# Patient Record
Sex: Female | Born: 1970
Health system: Southern US, Community
[De-identification: ages and names within clinical notes are randomized; demographics above are authoritative.]

## PROBLEM LIST (undated history)

## (undated) DIAGNOSIS — F32A Depression, unspecified: Secondary | ICD-10-CM

## (undated) DIAGNOSIS — M1990 Unspecified osteoarthritis, unspecified site: Secondary | ICD-10-CM

## (undated) DIAGNOSIS — E119 Type 2 diabetes mellitus without complications: Secondary | ICD-10-CM

## (undated) DIAGNOSIS — J302 Other seasonal allergic rhinitis: Secondary | ICD-10-CM

## (undated) DIAGNOSIS — I10 Essential (primary) hypertension: Secondary | ICD-10-CM

## (undated) DIAGNOSIS — R7303 Prediabetes: Secondary | ICD-10-CM

## (undated) DIAGNOSIS — M549 Dorsalgia, unspecified: Secondary | ICD-10-CM

## (undated) DIAGNOSIS — B019 Varicella without complication: Secondary | ICD-10-CM

## (undated) DIAGNOSIS — R002 Palpitations: Secondary | ICD-10-CM

## (undated) DIAGNOSIS — I872 Venous insufficiency (chronic) (peripheral): Secondary | ICD-10-CM

## (undated) DIAGNOSIS — F419 Anxiety disorder, unspecified: Secondary | ICD-10-CM

## (undated) DIAGNOSIS — G4730 Sleep apnea, unspecified: Secondary | ICD-10-CM

## (undated) DIAGNOSIS — R42 Dizziness and giddiness: Secondary | ICD-10-CM

## (undated) DIAGNOSIS — I890 Lymphedema, not elsewhere classified: Secondary | ICD-10-CM

## (undated) DIAGNOSIS — T7840XA Allergy, unspecified, initial encounter: Secondary | ICD-10-CM

## (undated) DIAGNOSIS — N926 Irregular menstruation, unspecified: Secondary | ICD-10-CM

## (undated) DIAGNOSIS — G629 Polyneuropathy, unspecified: Secondary | ICD-10-CM

## (undated) DIAGNOSIS — L719 Rosacea, unspecified: Secondary | ICD-10-CM

## (undated) DIAGNOSIS — F329 Major depressive disorder, single episode, unspecified: Secondary | ICD-10-CM

## (undated) DIAGNOSIS — M2550 Pain in unspecified joint: Secondary | ICD-10-CM

## (undated) HISTORY — DX: Pain in unspecified joint: M25.50

## (undated) HISTORY — PX: WISDOM TOOTH EXTRACTION: SHX21

## (undated) HISTORY — DX: Rosacea, unspecified: L71.9

## (undated) HISTORY — DX: Depression, unspecified: F32.A

## (undated) HISTORY — DX: Venous insufficiency (chronic) (peripheral): I87.2

## (undated) HISTORY — DX: Unspecified osteoarthritis, unspecified site: M19.90

## (undated) HISTORY — DX: Essential (primary) hypertension: I10

## (undated) HISTORY — DX: Dizziness and giddiness: R42

## (undated) HISTORY — DX: Palpitations: R00.2

## (undated) HISTORY — DX: Allergy, unspecified, initial encounter: T78.40XA

## (undated) HISTORY — DX: Sleep apnea, unspecified: G47.30

## (undated) HISTORY — DX: Varicella without complication: B01.9

## (undated) HISTORY — DX: Lymphedema, not elsewhere classified: I89.0

## (undated) HISTORY — DX: Dorsalgia, unspecified: M54.9

## (undated) HISTORY — DX: Major depressive disorder, single episode, unspecified: F32.9

## (undated) HISTORY — DX: Type 2 diabetes mellitus without complications: E11.9

---
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
## 2004-10-18 ENCOUNTER — Ambulatory Visit: Payer: Self-pay | Admitting: Obstetrics and Gynecology

## 2005-05-09 ENCOUNTER — Ambulatory Visit: Payer: Self-pay

## 2005-11-27 ENCOUNTER — Ambulatory Visit: Payer: Self-pay | Admitting: General Practice

## 2005-12-18 ENCOUNTER — Ambulatory Visit: Payer: Self-pay | Admitting: General Practice

## 2006-01-18 ENCOUNTER — Ambulatory Visit: Payer: Self-pay | Admitting: General Practice

## 2006-02-17 ENCOUNTER — Ambulatory Visit: Payer: Self-pay | Admitting: General Practice

## 2006-03-20 ENCOUNTER — Ambulatory Visit: Payer: Self-pay | Admitting: General Practice

## 2006-04-20 ENCOUNTER — Ambulatory Visit: Payer: Self-pay | Admitting: General Practice

## 2006-05-19 ENCOUNTER — Ambulatory Visit: Payer: Self-pay | Admitting: General Practice

## 2006-06-19 ENCOUNTER — Ambulatory Visit: Payer: Self-pay | Admitting: General Practice

## 2006-07-19 ENCOUNTER — Ambulatory Visit: Payer: Self-pay | Admitting: General Practice

## 2006-08-19 ENCOUNTER — Ambulatory Visit: Payer: Self-pay | Admitting: General Practice

## 2009-05-14 ENCOUNTER — Ambulatory Visit: Payer: Self-pay | Admitting: Otolaryngology

## 2009-06-21 ENCOUNTER — Ambulatory Visit: Payer: Self-pay

## 2010-07-15 ENCOUNTER — Ambulatory Visit: Payer: Self-pay

## 2011-07-18 ENCOUNTER — Ambulatory Visit: Payer: Self-pay | Admitting: Obstetrics and Gynecology

## 2012-02-21 ENCOUNTER — Emergency Department: Payer: Self-pay | Admitting: Emergency Medicine

## 2012-02-21 LAB — COMPREHENSIVE METABOLIC PANEL
Albumin: 3.2 g/dL — ABNORMAL LOW (ref 3.4–5.0)
Anion Gap: 6 — ABNORMAL LOW (ref 7–16)
BUN: 13 mg/dL (ref 7–18)
Bilirubin,Total: 1.1 mg/dL — ABNORMAL HIGH (ref 0.2–1.0)
Chloride: 110 mmol/L — ABNORMAL HIGH (ref 98–107)
Co2: 25 mmol/L (ref 21–32)
Creatinine: 0.69 mg/dL (ref 0.60–1.30)
EGFR (African American): >60
EGFR (Non-African Amer.): >60
Osmolality: 284 (ref 275–301)
Potassium: 3.7 mmol/L (ref 3.5–5.1)
SGOT(AST): 22 U/L (ref 15–37)
SGPT (ALT): 30 U/L (ref 12–78)
Sodium: 141 mmol/L (ref 136–145)
Total Protein: 6.5 g/dL (ref 6.4–8.2)

## 2012-02-21 LAB — URINALYSIS, COMPLETE
Bilirubin,UR: NEGATIVE
Glucose,UR: NEGATIVE mg/dL (ref 0–75)
Ph: 5 (ref 4.5–8.0)
RBC,UR: 329 /HPF (ref 0–5)
Specific Gravity: 1.025 (ref 1.003–1.030)
Squamous Epithelial: 2

## 2012-02-21 LAB — CBC
HGB: 13.3 g/dL (ref 12.0–16.0)
MCH: 26 pg (ref 26.0–34.0)
MCHC: 32.8 g/dL (ref 32.0–36.0)
MCV: 79 fL — ABNORMAL LOW (ref 80–100)
RDW: 16 % — ABNORMAL HIGH (ref 11.5–14.5)

## 2012-02-21 LAB — CK TOTAL AND CKMB (NOT AT ARMC)
CK, Total: 70 U/L (ref 21–215)
CK-MB: 0.8 ng/mL (ref 0.5–3.6)

## 2012-02-21 LAB — PRO B NATRIURETIC PEPTIDE: B-Type Natriuretic Peptide: 164 pg/mL — ABNORMAL HIGH (ref 0–125)

## 2012-02-21 LAB — RAPID INFLUENZA A&B ANTIGENS

## 2012-07-25 ENCOUNTER — Ambulatory Visit: Payer: Self-pay | Admitting: Obstetrics and Gynecology

## 2013-11-20 DIAGNOSIS — N8501 Benign endometrial hyperplasia: Secondary | ICD-10-CM | POA: Insufficient documentation

## 2014-12-17 ENCOUNTER — Other Ambulatory Visit: Payer: Self-pay | Admitting: Obstetrics and Gynecology

## 2014-12-17 DIAGNOSIS — Z1231 Encounter for screening mammogram for malignant neoplasm of breast: Secondary | ICD-10-CM

## 2014-12-25 ENCOUNTER — Encounter: Payer: Self-pay | Admitting: Physician Assistant

## 2014-12-25 ENCOUNTER — Ambulatory Visit: Payer: Self-pay | Admitting: Physician Assistant

## 2014-12-25 VITALS — BP 140/90 | Temp 98.8°F

## 2014-12-25 DIAGNOSIS — J00 Acute nasopharyngitis [common cold]: Secondary | ICD-10-CM

## 2014-12-25 NOTE — Progress Notes (Signed)
S: C/o runny nose and congestion for 7 days, no fever, chills, cp/sob, v/d; mucus is clear and thick, Using otc meds: mucinex D with relief, when stops using it she gets stopped up again  O: PE: perrl eomi, normocephalic, tms dull, nasal mucosa pink and swollen, throat injected, neck supple no lymph, lungs c t a, cv rrr, neuro intact  A:  Acute common cold   P: drink fluids, continue regular meds , use otc meds of choice, return if not improving in 5 days, return earlier if worsening, if mucus is green yellow on MOn/tues will call in an antibiotic but do not feel patient needs one right now

## 2014-12-28 ENCOUNTER — Ambulatory Visit
Admission: RE | Admit: 2014-12-28 | Discharge: 2014-12-28 | Disposition: A | Discharge status: Home / Self Care | Payer: 59 | Source: Ambulatory Visit | Attending: Obstetrics and Gynecology | Admitting: Obstetrics and Gynecology

## 2014-12-28 DIAGNOSIS — Z1231 Encounter for screening mammogram for malignant neoplasm of breast: Secondary | ICD-10-CM | POA: Diagnosis present

## 2015-05-04 DIAGNOSIS — H1045 Other chronic allergic conjunctivitis: Secondary | ICD-10-CM | POA: Diagnosis not present

## 2015-08-04 DIAGNOSIS — R7309 Other abnormal glucose: Secondary | ICD-10-CM | POA: Diagnosis not present

## 2015-08-10 DIAGNOSIS — R7309 Other abnormal glucose: Secondary | ICD-10-CM | POA: Diagnosis not present

## 2016-01-06 DIAGNOSIS — L820 Inflamed seborrheic keratosis: Secondary | ICD-10-CM | POA: Diagnosis not present

## 2016-01-06 DIAGNOSIS — L538 Other specified erythematous conditions: Secondary | ICD-10-CM | POA: Diagnosis not present

## 2016-01-06 DIAGNOSIS — L298 Other pruritus: Secondary | ICD-10-CM | POA: Diagnosis not present

## 2016-01-12 ENCOUNTER — Other Ambulatory Visit: Payer: Self-pay | Admitting: Obstetrics and Gynecology

## 2016-02-02 ENCOUNTER — Other Ambulatory Visit: Payer: Self-pay | Admitting: Obstetrics and Gynecology

## 2016-02-02 DIAGNOSIS — Z124 Encounter for screening for malignant neoplasm of cervix: Secondary | ICD-10-CM | POA: Diagnosis not present

## 2016-02-02 DIAGNOSIS — Z1211 Encounter for screening for malignant neoplasm of colon: Secondary | ICD-10-CM | POA: Diagnosis not present

## 2016-02-02 DIAGNOSIS — Z1231 Encounter for screening mammogram for malignant neoplasm of breast: Secondary | ICD-10-CM | POA: Diagnosis not present

## 2016-02-02 DIAGNOSIS — Z01419 Encounter for gynecological examination (general) (routine) without abnormal findings: Secondary | ICD-10-CM | POA: Diagnosis not present

## 2016-03-08 DIAGNOSIS — H5213 Myopia, bilateral: Secondary | ICD-10-CM | POA: Diagnosis not present

## 2016-03-08 DIAGNOSIS — H524 Presbyopia: Secondary | ICD-10-CM | POA: Diagnosis not present

## 2016-03-08 DIAGNOSIS — H52223 Regular astigmatism, bilateral: Secondary | ICD-10-CM | POA: Diagnosis not present

## 2016-03-08 DIAGNOSIS — E119 Type 2 diabetes mellitus without complications: Secondary | ICD-10-CM | POA: Diagnosis not present

## 2016-03-09 ENCOUNTER — Ambulatory Visit
Admission: RE | Admit: 2016-03-09 | Discharge: 2016-03-09 | Disposition: A | Discharge status: Home / Self Care | Payer: 59 | Source: Ambulatory Visit | Attending: Obstetrics and Gynecology | Admitting: Obstetrics and Gynecology

## 2016-03-09 DIAGNOSIS — Z1231 Encounter for screening mammogram for malignant neoplasm of breast: Secondary | ICD-10-CM | POA: Diagnosis not present

## 2016-03-19 ENCOUNTER — Emergency Department
Admission: EM | Admit: 2016-03-19 | Discharge: 2016-03-19 | Disposition: A | Discharge status: Home / Self Care | Payer: 59 | Attending: Emergency Medicine | Admitting: Emergency Medicine

## 2016-03-19 ENCOUNTER — Encounter: Payer: Self-pay | Admitting: Emergency Medicine

## 2016-03-19 DIAGNOSIS — Z79899 Other long term (current) drug therapy: Secondary | ICD-10-CM | POA: Insufficient documentation

## 2016-03-19 DIAGNOSIS — I10 Essential (primary) hypertension: Secondary | ICD-10-CM | POA: Diagnosis not present

## 2016-03-19 DIAGNOSIS — Z7984 Long term (current) use of oral hypoglycemic drugs: Secondary | ICD-10-CM | POA: Insufficient documentation

## 2016-03-19 DIAGNOSIS — R42 Dizziness and giddiness: Secondary | ICD-10-CM | POA: Insufficient documentation

## 2016-03-19 HISTORY — DX: Other seasonal allergic rhinitis: J30.2

## 2016-03-19 HISTORY — DX: Irregular menstruation, unspecified: N92.6

## 2016-03-19 HISTORY — DX: Prediabetes: R73.03

## 2016-03-19 LAB — URINALYSIS, COMPLETE (UACMP) WITH MICROSCOPIC
BACTERIA UA: NONE SEEN
BILIRUBIN URINE: NEGATIVE
Glucose, UA: NEGATIVE mg/dL
KETONES UR: NEGATIVE mg/dL
NITRITE: NEGATIVE
PROTEIN: NEGATIVE mg/dL
Specific Gravity, Urine: 1.015 (ref 1.005–1.030)
pH: 6 (ref 5.0–8.0)

## 2016-03-19 LAB — BASIC METABOLIC PANEL
ANION GAP: 6 (ref 5–15)
BUN: 15 mg/dL (ref 6–20)
CHLORIDE: 107 mmol/L (ref 101–111)
CO2: 25 mmol/L (ref 22–32)
Calcium: 9 mg/dL (ref 8.9–10.3)
Creatinine, Ser: 0.69 mg/dL (ref 0.44–1.00)
GFR calc Af Amer: >60 mL/min (ref >60)
GFR calc non Af Amer: >60 mL/min (ref >60)
GLUCOSE: 110 mg/dL — AB (ref 65–99)
POTASSIUM: 3.9 mmol/L (ref 3.5–5.1)
Sodium: 138 mmol/L (ref 135–145)

## 2016-03-19 LAB — CBC
HEMATOCRIT: 37.3 % (ref 35.0–47.0)
HEMOGLOBIN: 12.4 g/dL (ref 12.0–16.0)
MCH: 25.1 pg — ABNORMAL LOW (ref 26.0–34.0)
MCHC: 33.3 g/dL (ref 32.0–36.0)
MCV: 75.3 fL — ABNORMAL LOW (ref 80.0–100.0)
Platelets: 233 10*3/uL (ref 150–440)
RBC: 4.95 MIL/uL (ref 3.80–5.20)
RDW: 16.5 % — ABNORMAL HIGH (ref 11.5–14.5)
WBC: 12.6 10*3/uL — AB (ref 3.6–11.0)

## 2016-03-19 MED ORDER — MECLIZINE HCL 25 MG PO TABS
25.0000 mg | ORAL_TABLET | Freq: Once | ORAL | Status: AC
  Administered 2016-03-19: 25 mg via ORAL
  Filled 2016-03-19: qty 1

## 2016-03-19 MED ORDER — ONDANSETRON HCL 4 MG/2ML IJ SOLN: 4.0000 mg | Freq: Once | INTRAMUSCULAR | Status: DC

## 2016-03-19 MED ORDER — MECLIZINE HCL 25 MG PO TABS: 25.0000 mg | ORAL_TABLET | Freq: Three times a day (TID) | ORAL | 0 refills | Status: DC | PRN

## 2016-03-19 MED ORDER — SODIUM CHLORIDE 0.9 % IV BOLUS (SEPSIS): 1000.0000 mL | Freq: Once | INTRAVENOUS | Status: DC

## 2016-03-19 MED ORDER — ONDANSETRON HCL 4 MG PO TABS: 4.0000 mg | ORAL_TABLET | Freq: Three times a day (TID) | ORAL | 0 refills | Status: DC | PRN

## 2016-03-19 NOTE — ED Notes (Signed)
Pt states she prefers not to have an iv at this time. Is drinking water. Will monitor pt for now

## 2016-03-19 NOTE — ED Provider Notes (Addendum)
[facility] Emergency Department Provider Note  ____________________________________________   I have reviewed the triage vital signs and the nursing notes.   HISTORY  Chief Complaint Dizziness and Hypertension    HPI Belinda Garrett is a 45 y.o. female  Who  Presents today complaining of dizziness. Patient has a sensation of true vertigo. Started this morning when she turned her head after waking up. Before that she had no symptoms. Patient states that when she sits still she does not have any symptoms when she turns her head to the right or left she has a wave of lightheadedness, spinning sensation and nausea. It lasts for a few moments and then goes away if she sits still again. She does not have any other neurologic complaints. She denies any headache stiff neck recent fall she denies any dysuria or urinary frequency she denies any chest pain shortness of breath she denies any recent illness, she does not have a history of otitis media in the recent or distant past. She states she does clean her ears out pretty aggressively with Q-tips but has not done so today. She does not believe that she injured her ear while doing it yesterday. She states that she has no difficulty with her balance or her coordination in the context of this disease process.     Past Medical History:  Diagnosis Date  . Abnormal menses   . Prediabetes   . Seasonal allergies     Patient Active Problem List   Diagnosis Date Noted  . Morbid obesity ([place]) 11/20/2013  . Endometrial hyperplasia, simple 11/20/2013    No past surgical history on file.  Prior to Admission medications   Medication Sig Start Date End Date Taking? Authorizing Provider  FINACEA 15 % cream  12/18/14   Historical Provider, MD  FLUoxetine (PROZAC) 20 MG capsule Take 20 mg by mouth daily.    Historical Provider, MD  glucosamine-chondroitin 500-400 MG tablet Take 1 tablet by mouth 3 (three) times daily.    Historical  Provider, MD  loratadine (CLARITIN) 10 MG tablet Take 10 mg by mouth daily.    Historical Provider, MD  medroxyPROGESTERone (PROVERA) 5 MG tablet Take 5 mg by mouth daily.    Historical Provider, MD  metFORMIN (GLUCOPHAGE) 500 MG tablet TAKE ONE TABLET BY MOUTH 3 TIMES A DAY 06/11/14   Historical Provider, MD  vitamin B-12 (CYANOCOBALAMIN) 100 MCG tablet Take 100 mcg by mouth daily.    Historical Provider, MD    Allergies Latex  Family History  Problem Relation Age of Onset  . Breast cancer Neg Hx     Social History Social History  Substance Use Topics  . Smoking status: Never Smoker  . Smokeless tobacco: Not on file  . Alcohol use Not on file    Review of Systems Constitutional: No fever/chills Eyes: No visual changes. ENT: No sore throat. No stiff neck no neck pain Cardiovascular: Denies chest pain. Respiratory: Denies shortness of breath. Gastrointestinal:   no vomiting.  No diarrhea.  No constipation. Genitourinary: Negative for dysuria. Musculoskeletal: Negative lower extremity swelling Skin: Negative for rash. Neurological: Negative for severe headaches, focal weakness or numbness. 10-point ROS otherwise negative.  ____________________________________________   PHYSICAL EXAM:  VITAL SIGNS: ED Triage Vitals  Enc Vitals Group     BP 03/19/16 0958 (!) 162/87     Pulse Rate 03/19/16 0958 (!) 109     Resp 03/19/16 0958 18     Temp 03/19/16 0958 97.8 F (36.6  C)     Temp Source 03/19/16 0958 Oral     SpO2 03/19/16 0958 95 %     Weight 03/19/16 0959 (!) 457 lb (207.3 kg)     Height 03/19/16 0959 5\' 2"  (1.575 m)     Head Circumference --      Peak Flow --      Pain Score 03/19/16 1005 0     Pain Loc --      Pain Edu? --      Excl. in [place]? --     Constitutional: Alert and oriented. Well appearing and in no acute distress. Eyes: Conjunctivae are normal. PERRL. EOMI. Head: Atraumatic. Nose: No congestion/rhinnorhea. Mouth/Throat: Mucous membranes are moist.   Oropharynx non-erythematous. Neck: No stridor.   Nontender with no meningismus Cardiovascular: Normal rate, regular rhythm. Grossly normal heart sounds.  Good peripheral circulation. Respiratory: Normal respiratory effort.  No retractions. Lungs CTAB. Abdominal: Soft and nontender. No distention. No guarding no rebound Back:  There is no focal tenderness or step off.  there is no midline tenderness there are no lesions noted. there is no CVA tenderness Musculoskeletal: No lower extremity tenderness, no upper extremity tenderness. No joint effusions, no DVT signs strong distal pulses no edema Neurologic:   Cranial nerves II through XII are grossly intact 5 out of 5 strength bilateral upper and lower extremity. Finger to nose within normal limits heel to shin within normal limits, speech is normal with no word finding difficulty or dysarthria, reflexes symmetric, pupils are equally round and reactive to light, there is no pronator drift, sensation is normal, vision is intact to confrontation, gait is deferred, there is no nystagmus, normal neurologic exam positive nystagmus with head movement, when patient changes her head from left to the right of the right to the left she has nystagmus and this elicits her symptoms which rapidly abate. Skin:  Skin is warm, dry and intact. No rash noted. Psychiatric: Mood and affect are normal. Speech and behavior are normal.  ____________________________________________   LABS (all labs ordered are listed, but only abnormal results are displayed)  Labs Reviewed  BASIC METABOLIC PANEL - Abnormal; Notable for the following:       Result Value   Glucose, Bld 110 (*)    All other components within normal limits  CBC - Abnormal; Notable for the following:    WBC 12.6 (*)    MCV 75.3 (*)    MCH 25.1 (*)    RDW 16.5 (*)    All other components within normal limits  URINALYSIS, COMPLETE (UACMP) WITH MICROSCOPIC  CBG MONITORING, ED    ____________________________________________  EKG  I personally interpreted any EKGs ordered by me or triage No sinus rhythm at 90 bpm no acute ST elevation or acute ST depression, borderline LAD. ____________________________________________  RADIOLOGY  I reviewed any imaging ordered by me or triage that were performed during my shift and, if possible, patient and/or family made aware of any abnormal findings. ____________________________________________   PROCEDURES  Procedure(s) performed: None  Procedures  Critical Care performed: None  ____________________________________________   INITIAL IMPRESSION / ASSESSMENT AND PLAN / ED COURSE  Pertinent labs & imaging results that were available during my care of the patient were reviewed by me and considered in my medical decision making (see chart for details).  Patient with very reproducible fatigable nystagmus and vertigo that is positional with no attendant cerebellar signs on exam. TMs are normal. Most concerning for either labyrinthitis or otolith in the context  of a positional vertigo. We'll treat her as such. I have very low suspicion for CVA. Patient has no risk factors aside from morbid obesity. Patient unfortunately was 460 pounds and I'm informed that we may not be able to scan her anyway but fortunately I do not think it is indicated at this time, even if it were possible.. We will treat her symptomatically and we will reassess.   ----------------------------------------- 12:42 PM on 03/19/2016 -----------------------------------------  Patient after Antivert has a great reduction in symptoms she is able to change her head position without any difficulty. Her symptoms are nearly gone. Again confirming likely diagnosis. The rest of the workup also very reassuring thus far. No evidence of dysrhythmia or other cardiovascular pathology and no evidence of significant neurologic pathology noted Clinical Course     ____________________________________________   FINAL CLINICAL IMPRESSION(S) / ED DIAGNOSES  Final diagnoses:  None      This chart was dictated using voice recognition software.  Despite best efforts to proofread,  errors can occur which can change meaning.      Schuyler Amor, MD 03/19/16 1214    Schuyler Amor, MD 03/19/16 [phone]

## 2016-03-19 NOTE — ED Notes (Addendum)
FIRST NURSE NOTE: Dizziness since 4am, pt also had high BP 171/105. Pt does not take BP medication, takes metformin once a day.

## 2016-03-19 NOTE — ED Notes (Signed)
Pt states feels much better. Is giving urine sample now

## 2016-03-19 NOTE — ED Notes (Signed)
Pt states feels dizzy while laying down. Better when standing

## 2016-03-19 NOTE — ED Triage Notes (Signed)
Patient arrives to [facility] ED from home via POV with complaint of dizziness. Patient states that she "rolled over in bed and became very very dizzy". No prior history of vertigo or cardiac issues.

## 2016-03-20 LAB — URINE CULTURE: Culture: NO GROWTH

## 2016-03-29 DIAGNOSIS — H8111 Benign paroxysmal vertigo, right ear: Secondary | ICD-10-CM | POA: Diagnosis not present

## 2016-04-03 DIAGNOSIS — H8111 Benign paroxysmal vertigo, right ear: Secondary | ICD-10-CM | POA: Diagnosis not present

## 2016-04-03 DIAGNOSIS — H8113 Benign paroxysmal vertigo, bilateral: Secondary | ICD-10-CM | POA: Diagnosis not present

## 2016-07-05 DIAGNOSIS — R7309 Other abnormal glucose: Secondary | ICD-10-CM | POA: Diagnosis not present

## 2016-07-12 DIAGNOSIS — R7309 Other abnormal glucose: Secondary | ICD-10-CM | POA: Diagnosis not present

## 2016-10-18 DIAGNOSIS — L718 Other rosacea: Secondary | ICD-10-CM | POA: Diagnosis not present

## 2016-12-20 DIAGNOSIS — M7541 Impingement syndrome of right shoulder: Secondary | ICD-10-CM | POA: Diagnosis not present

## 2016-12-20 DIAGNOSIS — M25511 Pain in right shoulder: Secondary | ICD-10-CM | POA: Diagnosis not present

## 2017-01-02 ENCOUNTER — Encounter: Payer: Self-pay | Admitting: Physical Therapy

## 2017-01-02 ENCOUNTER — Ambulatory Visit: Payer: 59 | Attending: Sports Medicine | Admitting: Physical Therapy

## 2017-01-02 DIAGNOSIS — M25611 Stiffness of right shoulder, not elsewhere classified: Secondary | ICD-10-CM | POA: Diagnosis not present

## 2017-01-02 DIAGNOSIS — M25511 Pain in right shoulder: Secondary | ICD-10-CM | POA: Insufficient documentation

## 2017-01-02 NOTE — Therapy (Signed)
[address] [place], [place], [address] Phone: [phone]   Fax:  [phone]  Physical Therapy Evaluation  Patient Details  Name: Belinda Garrett MRN: [id number] Date of Birth: 11-26-1970 Referring Provider: Candelaria Stagers  Encounter Date: 01/02/2017      PT End of Session - 01/02/17 1640    Visit Number 1   Date for PT Re-Evaluation 03/04/17   PT Start Time 1600   PT Stop Time 1641   PT Time Calculation (min) 41 min   Activity Tolerance Patient tolerated treatment well   Behavior During Therapy [facility] for tasks assessed/performed      Past Medical History:  Diagnosis Date  . Abnormal menses   . Prediabetes   . Seasonal allergies     History reviewed. No pertinent surgical history.  There were no vitals filed for this visit.       Subjective Assessment - 01/02/17 1603    Subjective Reports right shoulder pain about 3 weeks ago, she is not sure of a specific cause.  She uses a cane in the right hand and puts a lot of pressure on the right.  She had x-rays and they were negative   Limitations House hold activities   Patient Stated Goals less pain and better motions   Currently in Pain? Yes   Pain Score 1    Pain Location Shoulder   Pain Orientation Right;Posterior;Lateral   Pain Descriptors / Indicators Aching   Pain Type Acute pain   Pain Onset 1 to 4 weeks ago   Pain Frequency Constant   Aggravating Factors  reaching out, lifting pain up to 8/10   Pain Relieving Factors rest, Aleve, Tylenol at best pain 2/10   Effect of Pain on Daily Activities just limits ADL's            [facility] PT Assessment - 01/02/17 0001      Assessment   Medical Diagnosis right subacromial impingement   Referring Provider Kubinski   Onset Date/Surgical Date 12/03/16   Hand Dominance Right     Precautions   Precautions None     Balance Screen   Has the patient fallen in the past 6 months No   Has the  patient had a decrease in activity level because of a fear of falling?  No   Is the patient reluctant to leave their home because of a fear of falling?  No     Home Environment   Additional Comments housework     Prior Function   Level of Independence Independent;Independent with community mobility with device   Vocation Full time employment   Vocation Requirements sitting at computer   Leisure no exercise     Posture/Postural Control   Posture Comments obese, fwd head, rounded shoulders     ROM / Strength   AROM / PROM / Strength AROM;PROM;Strength     AROM   AROM Assessment Site Shoulder   Right/Left Shoulder Right   Right Shoulder Flexion 110 Degrees   Right Shoulder ABduction 110 Degrees   Right Shoulder Internal Rotation 40 Degrees   Right Shoulder External Rotation 80 Degrees     PROM   PROM Assessment Site Shoulder   Right/Left Shoulder Right   Right Shoulder Flexion 125 Degrees   Right Shoulder ABduction 125 Degrees   Right Shoulder Internal Rotation 45 Degrees   Right Shoulder External Rotation 85 Degrees     Strength   Overall Strength Comments  4-/5 with pain for all motions     Palpation   Palpation comment tightness in the upper trap, posterior and anterior shoulder     Special Tests    Special Tests --  + impingement,  negative empty can            Objective measurements completed on examination: See above findings.                  PT Education - 01/02/17 1639    Education provided Yes   Education Details HEP for scapular and RC stability, anterior stretch, a lot of education about posture at her desk and then need to move   Person(s) Educated Patient   Methods Explanation;Demonstration;Handout   Comprehension Verbalized understanding;Returned demonstration             PT Long Term Goals - 01/02/17 1643      PT LONG TERM GOAL #1   Title independent with HEP   Time 2   Period Weeks   Status New     PT LONG TERM GOAL  #2   Title report no difficulty with ADL's due to right shoulder pain   Time 8   Period Weeks   Status New     PT LONG TERM GOAL #3   Title increase right shoulder IR to 60 degrees   Time 8   Period Weeks   Status New                Plan - 01/02/17 1642    Clinical Impression Statement Patient reports that she has had right shoulder pain for over 3 weeks, unsure of a cause.  She has limited ROM and positive impingement tests.  She has tightness int he upper traps and the pectorals   Clinical Presentation Stable   Clinical Decision Making Low   Rehab Potential Good   PT Frequency 1x / week   PT Duration 8 weeks   PT Treatment/Interventions ADLs/Self Care Home Management;Moist Heat;Ultrasound;Electrical Stimulation;Iontophoresis 4mg /ml Dexamethasone;Therapeutic activities;Therapeutic exercise;Manual techniques;Patient/family education   PT Next Visit Plan patient wants to try the HEP on her own   Consulted and Agree with Plan of Care Patient      Patient will benefit from skilled therapeutic intervention in order to improve the following deficits and impairments:  Decreased range of motion, Increased muscle spasms, Decreased strength, Improper body mechanics, Postural dysfunction  Visit Diagnosis: Acute pain of right shoulder - Plan: PT plan of care cert/re-cert  Stiffness of right shoulder, not elsewhere classified - Plan: PT plan of care cert/re-cert     Problem List Patient Active Problem List   Diagnosis Date Noted  . Morbid obesity ([place]) 11/20/2013  . Endometrial hyperplasia, simple 11/20/2013    Sumner Boast., PT 01/02/2017, 4:47 PM  [employer] [address] [place], [place], [address] Phone: [phone]   Fax:  [phone]  Name: MARISSAH VANDEMARK MRN: [id number] Date of Birth: 25-Nov-1970

## 2017-01-08 DIAGNOSIS — H8111 Benign paroxysmal vertigo, right ear: Secondary | ICD-10-CM | POA: Diagnosis not present

## 2017-01-08 DIAGNOSIS — H8113 Benign paroxysmal vertigo, bilateral: Secondary | ICD-10-CM | POA: Diagnosis not present

## 2017-01-10 DIAGNOSIS — R7303 Prediabetes: Secondary | ICD-10-CM | POA: Diagnosis not present

## 2017-01-17 DIAGNOSIS — F330 Major depressive disorder, recurrent, mild: Secondary | ICD-10-CM | POA: Diagnosis not present

## 2017-01-17 DIAGNOSIS — R7309 Other abnormal glucose: Secondary | ICD-10-CM | POA: Diagnosis not present

## 2017-02-06 ENCOUNTER — Other Ambulatory Visit: Payer: Self-pay | Admitting: Obstetrics and Gynecology

## 2017-02-06 DIAGNOSIS — Z1231 Encounter for screening mammogram for malignant neoplasm of breast: Secondary | ICD-10-CM

## 2017-02-06 DIAGNOSIS — Z01419 Encounter for gynecological examination (general) (routine) without abnormal findings: Secondary | ICD-10-CM | POA: Diagnosis not present

## 2017-03-15 DIAGNOSIS — H5213 Myopia, bilateral: Secondary | ICD-10-CM | POA: Diagnosis not present

## 2017-03-19 DIAGNOSIS — R42 Dizziness and giddiness: Secondary | ICD-10-CM | POA: Insufficient documentation

## 2017-05-07 ENCOUNTER — Ambulatory Visit: Payer: Self-pay | Admitting: Nurse Practitioner

## 2017-05-07 VITALS — BP 172/87 | HR 96 | Temp 98.8°F | Wt >= 6400 oz

## 2017-05-07 DIAGNOSIS — J069 Acute upper respiratory infection, unspecified: Secondary | ICD-10-CM

## 2017-05-07 DIAGNOSIS — B9789 Other viral agents as the cause of diseases classified elsewhere: Secondary | ICD-10-CM

## 2017-05-07 MED ORDER — PSEUDOEPH-BROMPHEN-DM 30-2-10 MG/5ML PO SYRP: 5.0000 mL | ORAL_SOLUTION | Freq: Four times a day (QID) | ORAL | 0 refills | Status: AC | PRN

## 2017-05-07 NOTE — Progress Notes (Signed)
Subjective:  Belinda Garrett is a 47 y.o. female who presents for evaluation of URI like symptoms.  Symptoms include cough described as productive of yellow sputum, no fever, post nasal drip, sore throat and wheezing.  Onset of symptoms was 2 days ago, and has been unchanged since that time.  Treatment to date:  acetaminophen and decongestants.  High risk factors for influenza complications:  none.  The following portions of the patient's history were reviewed and updated as appropriate:  allergies, current medications and past medical history.  Constitutional: positive for fatigue, negative for anorexia, fevers and malaise Eyes: negative Ears, nose, mouth, throat, and face: positive for nasal congestion and sore throat, negative for ear drainage, earaches and hoarseness Respiratory: positive for cough and wheezing, negative for asthma, chronic bronchitis, dyspnea on exertion, pleurisy/chest pain and pneumonia Cardiovascular: negative Gastrointestinal: negative Neurological: negative Allergic/Immunologic: positive for hay fever Objective:  BP (!) 172/87   Pulse 96   Temp 98.8 F (37.1 C)   Wt (!) 470 lb (213.2 kg)   SpO2 94%   BMI 85.96 kg/m  General appearance: alert, cooperative and no distress Head: Normocephalic, without obvious abnormality, atraumatic Eyes: conjunctivae/corneas clear. PERRL, EOM's intact. Fundi benign. Ears: normal TM's and external ear canals both ears Nose: Nares normal. Septum midline. Mucosa normal. No drainage or sinus tenderness., no sinus tenderness Throat: abnormal findings: mild oropharyngeal erythema and no exudates Lungs: clear to auscultation bilaterally Heart: regular rate and rhythm, S1, S2 normal, no murmur, click, rub or gallop Abdomen: soft, non-tender; bowel sounds normal; no masses,  no organomegaly Pulses: 2+ and symmetric Skin: Skin color, texture, turgor normal. No rashes or lesions Lymph nodes: cervical and submandibular nodes  normal Neurologic: Grossly normal    Assessment:  viral upper respiratory illness    Plan:  Discussed diagnosis and treatment of URI. Discussed the importance of avoiding unnecessary antibiotic therapy. Educational material distributed and questions answered. Suggested symptomatic OTC remedies. Supportive care with appropriate antipyretics and fluids. Bromfed per orders.

## 2017-05-07 NOTE — Patient Instructions (Addendum)

## 2017-05-10 ENCOUNTER — Other Ambulatory Visit: Payer: Self-pay | Admitting: Family Medicine

## 2017-05-10 MED ORDER — AZITHROMYCIN 250 MG PO TABS: ORAL_TABLET | ORAL | 0 refills | Status: DC

## 2017-05-10 NOTE — Progress Notes (Signed)
Azithromycin pack ordered as patient called to advise symptoms have not improved since her last visit on 05/08/2017.

## 2017-05-15 DIAGNOSIS — I10 Essential (primary) hypertension: Secondary | ICD-10-CM | POA: Diagnosis not present

## 2017-07-11 ENCOUNTER — Telehealth: Payer: Self-pay

## 2017-07-11 NOTE — Telephone Encounter (Signed)
Please advise 

## 2017-07-11 NOTE — Telephone Encounter (Signed)
Copied from [place] [id number]. Topic: Appointment Scheduling - Scheduling Inquiry for Clinic >> Jul 11, 2017  2:06 PM Ether Griffins B wrote: Reason for CRM: pt would like to est with Dr. Derrel Nip and have a CPE.

## 2017-07-11 NOTE — Telephone Encounter (Signed)
I am currently unable to accept new patients due to access issues.

## 2017-07-12 NOTE — Telephone Encounter (Signed)
Pt was notified that Dr. Derrel Nip is not accepting any new pt's at this time. Pt is willing to get established with Dr. Aundra Dubin. Will you please schedule pt with Dr. Aundra Dubin.

## 2017-09-05 ENCOUNTER — Ambulatory Visit (INDEPENDENT_AMBULATORY_CARE_PROVIDER_SITE_OTHER): Payer: 59 | Admitting: Internal Medicine

## 2017-09-05 ENCOUNTER — Encounter: Payer: Self-pay | Admitting: Internal Medicine

## 2017-09-05 VITALS — BP 150/100 | HR 96 | Temp 98.9°F | Ht 62.0 in | Wt >= 6400 oz

## 2017-09-05 DIAGNOSIS — I10 Essential (primary) hypertension: Secondary | ICD-10-CM | POA: Insufficient documentation

## 2017-09-05 DIAGNOSIS — R200 Anesthesia of skin: Secondary | ICD-10-CM

## 2017-09-05 DIAGNOSIS — Z23 Encounter for immunization: Secondary | ICD-10-CM

## 2017-09-05 DIAGNOSIS — E559 Vitamin D deficiency, unspecified: Secondary | ICD-10-CM

## 2017-09-05 DIAGNOSIS — Z1159 Encounter for screening for other viral diseases: Secondary | ICD-10-CM

## 2017-09-05 DIAGNOSIS — E119 Type 2 diabetes mellitus without complications: Secondary | ICD-10-CM

## 2017-09-05 DIAGNOSIS — Z0184 Encounter for antibody response examination: Secondary | ICD-10-CM | POA: Diagnosis not present

## 2017-09-05 DIAGNOSIS — E538 Deficiency of other specified B group vitamins: Secondary | ICD-10-CM

## 2017-09-05 DIAGNOSIS — Z1231 Encounter for screening mammogram for malignant neoplasm of breast: Secondary | ICD-10-CM | POA: Diagnosis not present

## 2017-09-05 LAB — COMPREHENSIVE METABOLIC PANEL
ALT: 21 U/L (ref 0–35)
AST: 12 U/L (ref 0–37)
Albumin: 4.1 g/dL (ref 3.5–5.2)
Alkaline Phosphatase: 62 U/L (ref 39–117)
BUN: 17 mg/dL (ref 6–23)
CHLORIDE: 102 meq/L (ref 96–112)
CO2: 29 meq/L (ref 19–32)
Calcium: 9.7 mg/dL (ref 8.4–10.5)
Creatinine, Ser: 0.82 mg/dL (ref 0.40–1.20)
GFR: 78.91 mL/min (ref >60.00)
Glucose, Bld: 101 mg/dL — ABNORMAL HIGH (ref 70–99)
POTASSIUM: 4.5 meq/L (ref 3.5–5.1)
SODIUM: 139 meq/L (ref 135–145)
Total Bilirubin: 0.9 mg/dL (ref 0.2–1.2)
Total Protein: 7.3 g/dL (ref 6.0–8.3)

## 2017-09-05 LAB — LIPID PANEL
CHOLESTEROL: 179 mg/dL (ref 0–200)
HDL: 49.8 mg/dL (ref >39.00)
LDL CALC: 110 mg/dL — AB (ref 0–99)
NonHDL: 128.75
Total CHOL/HDL Ratio: 4
Triglycerides: 95 mg/dL (ref 0.0–149.0)
VLDL: 19 mg/dL (ref 0.0–40.0)

## 2017-09-05 LAB — TSH: TSH: 1.91 u[IU]/mL (ref 0.35–4.50)

## 2017-09-05 LAB — CBC WITH DIFFERENTIAL/PLATELET
BASOS PCT: 0.7 % (ref 0.0–3.0)
Basophils Absolute: 0.1 10*3/uL (ref 0.0–0.1)
EOS PCT: 1.6 % (ref 0.0–5.0)
Eosinophils Absolute: 0.2 10*3/uL (ref 0.0–0.7)
HCT: 40.1 % (ref 36.0–46.0)
HEMOGLOBIN: 13.2 g/dL (ref 12.0–15.0)
LYMPHS ABS: 2.5 10*3/uL (ref 0.7–4.0)
Lymphocytes Relative: 20.4 % (ref 12.0–46.0)
MCHC: 33 g/dL (ref 30.0–36.0)
MCV: 80.8 fl (ref 78.0–100.0)
MONO ABS: 0.6 10*3/uL (ref 0.1–1.0)
MONOS PCT: 4.6 % (ref 3.0–12.0)
NEUTROS PCT: 72.7 % (ref 43.0–77.0)
Neutro Abs: 8.8 10*3/uL — ABNORMAL HIGH (ref 1.4–7.7)
Platelets: 262 10*3/uL (ref 150.0–400.0)
RBC: 4.96 Mil/uL (ref 3.87–5.11)
RDW: 16.7 % — AB (ref 11.5–15.5)
WBC: 12.1 10*3/uL — ABNORMAL HIGH (ref 4.0–10.5)

## 2017-09-05 LAB — HEMOGLOBIN A1C: Hgb A1c MFr Bld: 5.9 % (ref 4.6–6.5)

## 2017-09-05 LAB — VITAMIN B12

## 2017-09-05 LAB — T4, FREE: FREE T4: 1.05 ng/dL (ref 0.60–1.60)

## 2017-09-05 LAB — VITAMIN D 25 HYDROXY (VIT D DEFICIENCY, FRACTURES): VITD: 42.73 ng/mL (ref 30.00–100.00)

## 2017-09-05 MED ORDER — LISINOPRIL 30 MG PO TABS
30.0000 mg | ORAL_TABLET | Freq: Every day | ORAL | 1 refills | Status: DC
Start: 2017-09-05 — End: 2018-03-21

## 2017-09-05 NOTE — Progress Notes (Addendum)
Chief Complaint  Patient presents with  . Follow-up   NP establish former PCP Dr. Benita Stabile  1. HTN uncontrolled on Lis 10  2. Morbidly obese and causes depression  3. C/o numbness in b/l toes L>R intermittent low back pain mild not bothersome  4. H/o DM 2 on metformin 500 mg qd    Review of Systems  Constitutional: Negative for weight loss.  HENT: Negative for hearing loss.   Eyes: Negative for blurred vision.  Respiratory: Negative for shortness of breath.   Cardiovascular: Negative for chest pain.  Gastrointestinal: Negative for abdominal pain.  Musculoskeletal: Negative for falls.  Skin: Positive for rash.       Chronic redness to b/l legs   Neurological: Positive for sensory change.  Psychiatric/Behavioral: Positive for depression.   Past Medical History:  Diagnosis Date  . Abnormal menses   . Prediabetes   . Seasonal allergies    No past surgical history on file. Family History  Problem Relation Age of Onset  . Breast cancer Neg Hx    Social History   Socioeconomic History  . Marital status: Single    Spouse name: Not on file  . Number of children: Not on file  . Years of education: Not on file  . Highest education level: Not on file  Occupational History  . Not on file  Social Needs  . Financial resource strain: Not on file  . Food insecurity:    Worry: Not on file    Inability: Not on file  . Transportation needs:    Medical: Not on file    Non-medical: Not on file  Tobacco Use  . Smoking status: Never Smoker  Substance and Sexual Activity  . Alcohol use: Not on file  . Drug use: Not on file  . Sexual activity: Not on file  Lifestyle  . Physical activity:    Days per week: Not on file    Minutes per session: Not on file  . Stress: Not on file  Relationships  . Social connections:    Talks on phone: Not on file    Gets together: Not on file    Attends religious service: Not on file    Active member of club or organization: Not on file   Attends meetings of clubs or organizations: Not on file    Relationship status: Not on file  . Intimate partner violence:    Fear of current or ex partner: Not on file    Emotionally abused: Not on file    Physically abused: Not on file    Forced sexual activity: Not on file  Other Topics Concern  . Not on file  Social History Narrative  . Not on file   Current Meds  Medication Sig  . Cyanocobalamin (VITAMIN B-12) 5000 MCG TBDP Take 5,000 mcg by mouth daily.   [name] FINACEA 15 % cream   . FLUoxetine (PROZAC) 20 MG tablet Take 20 mg by mouth 2 (two) times daily.   [name] glucosamine-chondroitin 500-400 MG tablet Take 1 tablet by mouth 3 (three) times daily.  [name] lisinopril (PRINIVIL,ZESTRIL) 30 MG tablet Take 1 tablet (30 mg total) by mouth daily.  [name] loratadine (CLARITIN) 10 MG tablet Take 10 mg by mouth daily.  . medroxyPROGESTERone (PROVERA) 2.5 MG tablet Take 2.5 mg by mouth daily.   . metFORMIN (GLUCOPHAGE) 500 MG tablet TAKE ONE TABLET BY MOUTH 3 TIMES A DAY  . Naproxen Sodium (ALEVE) 220 MG CAPS Take 440 mg by  mouth daily.  . Omega-3 Fatty Acids (FISH OIL) 1000 MG CAPS Take 1,000 mg by mouth daily.  . [DISCONTINUED] lisinopril (PRINIVIL,ZESTRIL) 10 MG tablet Take 10 mg by mouth daily.   Allergies  Allergen Reactions  . Latex    No results found for this or any previous visit (from the past 2160 hour(s)). Objective  Body mass index is 87.79 kg/m. Wt Readings from Last 3 Encounters:  09/05/17 (!) 480 lb (217.7 kg)  05/07/17 (!) 470 lb (213.2 kg)  03/19/16 (!) 457 lb (207.3 kg)   Temp Readings from Last 3 Encounters:  09/05/17 98.9 F (37.2 C) (Oral)  05/07/17 98.8 F (37.1 C)  03/19/16 97.8 F (36.6 C) (Oral)   BP Readings from Last 3 Encounters:  09/05/17 (!) 150/100  05/07/17 (!) 172/87  03/19/16 (!) 166/90   Pulse Readings from Last 3 Encounters:  09/05/17 96  05/07/17 96  03/19/16 92    Physical Exam  Constitutional: She is oriented to person, place, and time.  She appears well-developed and well-nourished. She is cooperative.  HENT:  Head: Normocephalic and atraumatic.  Mouth/Throat: Oropharynx is clear and moist and mucous membranes are normal.  Eyes: Pupils are equal, round, and reactive to light. Conjunctivae are normal.  Cardiovascular: Normal rate, regular rhythm and normal heart sounds.  Pulmonary/Chest: Effort normal and breath sounds normal.  Neurological: She is alert and oriented to person, place, and time. Gait normal.  Skin: Skin is warm, dry and intact.  Stasis derm changes b/l legs  Psychiatric: She has a normal mood and affect. Her speech is normal and behavior is normal. Judgment and thought content normal. Cognition and memory are normal.  Tearful on exam   Nursing note and vitals reviewed.   Assessment   1. HTN uncontrolled  2. DM 2  3. Morbid obesity BMI >87  4. Numbness in b/l toes ? Related #2 vs other  5. HM Plan   1.  Increase lis 10 to 30 mg qd  2. Check labs today including urine  3. Hold on wt loss clinic referral  4. Refer neurology EMG testing labs today  5.  Had flu shot  Given Tdap today  Declines STD testing  Pap 02/02/16 neg neg HPV Referred mammogram  Derm is Dr. Evorn Gong     Get records Dr. Laurence Ferrari -Reviewed above and scanned into chart  Confirm with pt if taking metformin ER 500 mg qd  Provider: Dr. Olivia Mackie McLean-Scocuzza-Internal Medicine

## 2017-09-05 NOTE — Patient Instructions (Signed)
F/u in 1 month   DTaP Vaccine (Diphtheria, Tetanus, and Pertussis): What You Need to Know 1. Why get vaccinated? Diphtheria, tetanus, and pertussis are serious diseases caused by bacteria. Diphtheria and pertussis are spread from person to person. Tetanus enters the body through cuts or wounds. DIPHTHERIA causes a thick covering in the back of the throat.  It can lead to breathing problems, paralysis, heart failure, and even death.  TETANUS (Lockjaw) causes painful tightening of the muscles, usually all over the body.  It can lead to "locking" of the jaw so the victim cannot open his mouth or swallow. Tetanus leads to death in up to 2 out of 10 cases.  PERTUSSIS (Whooping Cough) causes coughing spells so bad that it is hard for infants to eat, drink, or breathe. These spells can last for weeks.  It can lead to pneumonia, seizures (jerking and staring spells), brain damage, and death.  Diphtheria, tetanus, and pertussis vaccine (DTaP) can help prevent these diseases. Most children who are vaccinated with DTaP will be protected throughout childhood. Many more children would get these diseases if we stopped vaccinating. DTaP is a safer version of an older vaccine called DTP. DTP is no longer used in the [place]. 2. Who should get DTaP vaccine and when? Children should get 5 doses of DTaP vaccine, one dose at each of the following ages:  2 months  4 months  6 months  15-18 months  4-6 years  DTaP may be given at the same time as other vaccines. 3. Some children should not get DTaP vaccine or should wait  Children with minor illnesses, such as a cold, may be vaccinated. But children who are moderately or severely ill should usually wait until they recover before getting DTaP vaccine.  Any child who had a life-threatening allergic reaction after a dose of DTaP should not get another dose.  Any child who suffered a brain or nervous system disease within 7 days after a dose of  DTaP should not get another dose.  Talk with your doctor if your child: ? had a seizure or collapsed after a dose of DTaP, ? cried non-stop for 3 hours or more after a dose of DTaP, ? had a fever over 105F after a dose of DTaP. Ask your doctor for more information. Some of these children should not get another dose of pertussis vaccine, but may get a vaccine without pertussis, called DT. 4. Older children and adults DTaP is not licensed for adolescents, adults, or children 59 years of age and older. But older people still need protection. A vaccine called Tdap is similar to DTaP. A single dose of Tdap is recommended for people 11 through 49 years of age. Another vaccine, called Td, protects against tetanus and diphtheria, but not pertussis. It is recommended every 10 years. There are separate Vaccine Information Statements for these vaccines. 5. What are the risks from DTaP vaccine? Getting diphtheria, tetanus, or pertussis disease is much riskier than getting DTaP vaccine. However, a vaccine, like any medicine, is capable of causing serious problems, such as severe allergic reactions. The risk of DTaP vaccine causing serious harm, or death, is extremely small. Mild problems (common)  Fever (up to about 1 child in 4)  Redness or swelling where the shot was given (up to about 1 child in 4)  Soreness or tenderness where the shot was given (up to about 1 child in 4) These problems occur more often after the 4th and 5th doses  of the DTaP series than after earlier doses. Sometimes the 4th or 5th dose of DTaP vaccine is followed by swelling of the entire arm or leg in which the shot was given, lasting 1-7 days (up to about 1 child in 72). Other mild problems include:  Fussiness (up to about 1 child in 3)  Tiredness or poor appetite (up to about 1 child in 10)  Vomiting (up to about 1 child in 39) These problems generally occur 1-3 days after the shot. Moderate problems (uncommon)  Seizure  (jerking or staring) (about 1 child out of 14,000)  Non-stop crying, for 3 hours or more (up to about 1 child out of 1,000)  High fever, over 105F (about 1 child out of 16,000) Severe problems (very rare)  Serious allergic reaction (less than 1 out of a million doses)  Several other severe problems have been reported after DTaP vaccine. These include: ? Long-term seizures, coma, or lowered consciousness ? Permanent brain damage. These are so rare it is hard to tell if they are caused by the vaccine. Controlling fever is especially important for children who have had seizures, for any reason. It is also important if another family member has had seizures. You can reduce fever and pain by giving your child an aspirin-free pain reliever when the shot is given, and for the next 24 hours, following the package instructions. 6. What if there is a serious reaction? What should I look for? Look for anything that concerns you, such as signs of a severe allergic reaction, very high fever, or behavior changes. Signs of a severe allergic reaction can include hives, swelling of the face and throat, difficulty breathing, a fast heartbeat, dizziness, and weakness. These would start a few minutes to a few hours after the vaccination. What should I do?  If you think it is a severe allergic reaction or other emergency that can't wait, call 9-1-1 or get the person to the nearest hospital. Otherwise, call your doctor.  Afterward, the reaction should be reported to the Vaccine Adverse Event Reporting System (VAERS). Your doctor might file this report, or you can do it yourself through the VAERS web site at www.vaers.SamedayNews.es, or by calling 2017347661. ? VAERS is only for reporting reactions. They do not give medical advice. 7. The National Vaccine Injury Compensation Program The [employer] Vaccine Injury Compensation Program (VICP) is a federal program that was created to compensate people who may have been  injured by certain vaccines. Persons who believe they may have been injured by a vaccine can learn about the program and about filing a claim by calling 641-505-3665 or visiting the [place] website at GoldCloset.com.ee. 8. How can I learn more?  Ask your doctor.  Call your local or state health department.  Contact the Centers for Disease Control and Prevention (CDC): ? Call 506-873-2149 (1-800-CDC-INFO) or ? Visit CDC's website at http://hunter.com/ CDC DTaP Vaccine (Diphtheria, Tetanus, and Pertussis) VIS (08/03/05) This information is not intended to replace advice given to you by your health care provider. Make sure you discuss any questions you have with your health care provider. Document Released: 01/01/2006 Document Revised: 11/25/2015 Document Reviewed: 11/25/2015 Elsevier Interactive Patient Education  2017 Regino Ramirez DASH stands for "Dietary Approaches to Stop Hypertension." The DASH eating plan is a healthy eating plan that has been shown to reduce high blood pressure (hypertension). It may also reduce your risk for type 2 diabetes, heart disease, and stroke. The DASH eating plan may  also help with weight loss. What are tips for following this plan? General guidelines  Avoid eating more than 2,300 mg (milligrams) of salt (sodium) a day. If you have hypertension, you may need to reduce your sodium intake to 1,500 mg a day.  Limit alcohol intake to no more than 1 drink a day for nonpregnant women and 2 drinks a day for men. One drink equals 12 oz of beer, 5 oz of wine, or 1 oz of hard liquor.  Work with your health care provider to maintain a healthy body weight or to lose weight. Ask what an ideal weight is for you.  Get at least 30 minutes of exercise that causes your heart to beat faster (aerobic exercise) most days of the week. Activities may include walking, swimming, or biking.  Work with your health care provider or diet and  nutrition specialist (dietitian) to adjust your eating plan to your individual calorie needs. Reading food labels  Check food labels for the amount of sodium per serving. Choose foods with less than 5 percent of the Daily Value of sodium. Generally, foods with less than 300 mg of sodium per serving fit into this eating plan.  To find whole grains, look for the word "whole" as the first word in the ingredient list. Shopping  Buy products labeled as "low-sodium" or "no salt added."  Buy fresh foods. Avoid canned foods and premade or frozen meals. Cooking  Avoid adding salt when cooking. Use salt-free seasonings or herbs instead of table salt or sea salt. Check with your health care provider or pharmacist before using salt substitutes.  Do not fry foods. Cook foods using healthy methods such as baking, boiling, grilling, and broiling instead.  Cook with heart-healthy oils, such as olive, canola, soybean, or sunflower oil. Meal planning   Eat a balanced diet that includes: ? 5 or more servings of fruits and vegetables each day. At each meal, try to fill half of your plate with fruits and vegetables. ? Up to 6-8 servings of whole grains each day. ? Less than 6 oz of lean meat, poultry, or fish each day. A 3-oz serving of meat is about the same size as a deck of cards. One egg equals 1 oz. ? 2 servings of low-fat dairy each day. ? A serving of nuts, seeds, or beans 5 times each week. ? Heart-healthy fats. Healthy fats called Omega-3 fatty acids are found in foods such as flaxseeds and coldwater fish, like sardines, salmon, and mackerel.  Limit how much you eat of the following: ? Canned or prepackaged foods. ? Food that is high in trans fat, such as fried foods. ? Food that is high in saturated fat, such as fatty meat. ? Sweets, desserts, sugary drinks, and other foods with added sugar. ? Full-fat dairy products.  Do not salt foods before eating.  Try to eat at least 2 vegetarian  meals each week.  Eat more home-cooked food and less restaurant, buffet, and fast food.  When eating at a restaurant, ask that your food be prepared with less salt or no salt, if possible. What foods are recommended? The items listed may not be a complete list. Talk with your dietitian about what dietary choices are best for you. Grains Whole-grain or whole-wheat bread. Whole-grain or whole-wheat pasta. Brown rice. [place]. Bulgur. Whole-grain and low-sodium cereals. Pita bread. Low-fat, low-sodium crackers. Whole-wheat flour tortillas. Vegetables Fresh or frozen vegetables (raw, steamed, roasted, or grilled). Low-sodium or reduced-sodium tomato and vegetable  juice. Low-sodium or reduced-sodium tomato sauce and tomato paste. Low-sodium or reduced-sodium canned vegetables. Fruits All fresh, dried, or frozen fruit. Canned fruit in natural juice (without added sugar). Meat and other protein foods Skinless chicken or [place]. Ground chicken or [place]. Pork with fat trimmed off. Fish and seafood. Egg whites. Dried beans, peas, or lentils. Unsalted nuts, nut butters, and seeds. Unsalted canned beans. Lean cuts of beef with fat trimmed off. Low-sodium, lean deli meat. Dairy Low-fat (1%) or fat-free (skim) milk. Fat-free, low-fat, or reduced-fat cheeses. Nonfat, low-sodium ricotta or cottage cheese. Low-fat or nonfat yogurt. Low-fat, low-sodium cheese. Fats and oils Soft margarine without trans fats. Vegetable oil. Low-fat, reduced-fat, or light mayonnaise and salad dressings (reduced-sodium). Canola, safflower, olive, soybean, and sunflower oils. Avocado. Seasoning and other foods Herbs. Spices. Seasoning mixes without salt. Unsalted popcorn and pretzels. Fat-free sweets. What foods are not recommended? The items listed may not be a complete list. Talk with your dietitian about what dietary choices are best for you. Grains Baked goods made with fat, such as croissants, muffins, or some  breads. Dry pasta or rice meal packs. Vegetables Creamed or fried vegetables. Vegetables in a cheese sauce. Regular canned vegetables (not low-sodium or reduced-sodium). Regular canned tomato sauce and paste (not low-sodium or reduced-sodium). Regular tomato and vegetable juice (not low-sodium or reduced-sodium). Angie Fava. Olives. Fruits Canned fruit in a light or heavy syrup. Fried fruit. Fruit in cream or butter sauce. Meat and other protein foods Fatty cuts of meat. Ribs. Fried meat. Berniece Salines. Sausage. Bologna and other processed lunch meats. Salami. Fatback. Hotdogs. Bratwurst. Salted nuts and seeds. Canned beans with added salt. Canned or smoked fish. Whole eggs or egg yolks. Chicken or [place] with skin. Dairy Whole or 2% milk, cream, and half-and-half. Whole or full-fat cream cheese. Whole-fat or sweetened yogurt. Full-fat cheese. Nondairy creamers. Whipped toppings. Processed cheese and cheese spreads. Fats and oils Butter. Stick margarine. Lard. Shortening. Ghee. Bacon fat. Tropical oils, such as coconut, palm kernel, or palm oil. Seasoning and other foods Salted popcorn and pretzels. Onion salt, garlic salt, seasoned salt, table salt, and sea salt. Worcestershire sauce. Tartar sauce. Barbecue sauce. Teriyaki sauce. Soy sauce, including reduced-sodium. Steak sauce. Canned and packaged gravies. Fish sauce. Oyster sauce. Cocktail sauce. Horseradish that you find on the shelf. Ketchup. Mustard. Meat flavorings and tenderizers. Bouillon cubes. Hot sauce and Tabasco sauce. Premade or packaged marinades. Premade or packaged taco seasonings. Relishes. Regular salad dressings. Where to find more information:  National Heart, Lung, and [place]: https://wilson-eaton.com/  American Heart Association: www.heart.org Summary  The DASH eating plan is a healthy eating plan that has been shown to reduce high blood pressure (hypertension). It may also reduce your risk for type 2 diabetes, heart disease, and  stroke.  With the DASH eating plan, you should limit salt (sodium) intake to 2,300 mg a day. If you have hypertension, you may need to reduce your sodium intake to 1,500 mg a day.  When on the DASH eating plan, aim to eat more fresh fruits and vegetables, whole grains, lean proteins, low-fat dairy, and heart-healthy fats.  Work with your health care provider or diet and nutrition specialist (dietitian) to adjust your eating plan to your individual calorie needs. This information is not intended to replace advice given to you by your health care provider. Make sure you discuss any questions you have with your health care provider. Document Released: 02/23/2011 Document Revised: 02/28/2016 Document Reviewed: 02/28/2016 Elsevier Interactive Patient Education  [employer].  Hypertension Hypertension is another name for high blood pressure. High blood pressure forces your heart to work harder to pump blood. This can cause problems over time. There are two numbers in a blood pressure reading. There is a top number (systolic) over a bottom number (diastolic). It is best to have a blood pressure below 120/80. Healthy choices can help lower your blood pressure. You may need medicine to help lower your blood pressure if:  Your blood pressure cannot be lowered with healthy choices.  Your blood pressure is higher than 130/80.  Follow these instructions at home: Eating and drinking  If directed, follow the DASH eating plan. This diet includes: ? Filling half of your plate at each meal with fruits and vegetables. ? Filling one quarter of your plate at each meal with whole grains. Whole grains include whole wheat pasta, brown rice, and whole grain bread. ? Eating or drinking low-fat dairy products, such as skim milk or low-fat yogurt. ? Filling one quarter of your plate at each meal with low-fat (lean) proteins. Low-fat proteins include fish, skinless chicken, eggs, beans, and tofu. ? Avoiding  fatty meat, cured and processed meat, or chicken with skin. ? Avoiding premade or processed food.  Eat less than 1,500 mg of salt (sodium) a day.  Limit alcohol use to no more than 1 drink a day for nonpregnant women and 2 drinks a day for men. One drink equals 12 oz of beer, 5 oz of wine, or 1 oz of hard liquor. Lifestyle  Work with your doctor to stay at a healthy weight or to lose weight. Ask your doctor what the best weight is for you.  Get at least 30 minutes of exercise that causes your heart to beat faster (aerobic exercise) most days of the week. This may include walking, swimming, or biking.  Get at least 30 minutes of exercise that strengthens your muscles (resistance exercise) at least 3 days a week. This may include lifting weights or pilates.  Do not use any products that contain nicotine or tobacco. This includes cigarettes and e-cigarettes. If you need help quitting, ask your doctor.  Check your blood pressure at home as told by your doctor.  Keep all follow-up visits as told by your doctor. This is important. Medicines  Take over-the-counter and prescription medicines only as told by your doctor. Follow directions carefully.  Do not skip doses of blood pressure medicine. The medicine does not work as well if you skip doses. Skipping doses also puts you at risk for problems.  Ask your doctor about side effects or reactions to medicines that you should watch for. Contact a doctor if:  You think you are having a reaction to the medicine you are taking.  You have headaches that keep coming back (recurring).  You feel dizzy.  You have swelling in your ankles.  You have trouble with your vision. Get help right away if:  You get a very bad headache.  You start to feel confused.  You feel weak or numb.  You feel faint.  You get very bad pain in your: ? Chest. ? Belly (abdomen).  You throw up (vomit) more than once.  You have trouble  breathing. Summary  Hypertension is another name for high blood pressure.  Making healthy choices can help lower blood pressure. If your blood pressure cannot be controlled with healthy choices, you may need to take medicine. This information is not intended to replace advice given to you by your health  care provider. Make sure you discuss any questions you have with your health care provider. Document Released: 08/23/2007 Document Revised: 02/02/2016 Document Reviewed: 02/02/2016 Elsevier Interactive Patient Education  2018 [employer].  Paresthesia Paresthesia is an abnormal burning or prickling sensation. This sensation is generally felt in the hands, arms, legs, or feet. However, it may occur in any part of the body. Usually, it is not painful. The feeling may be described as:  Tingling or numbness.  Pins and needles.  Skin crawling.  Buzzing.  Limbs falling asleep.  Itching.  Most people experience temporary (transient) paresthesia at some time in their lives. Paresthesia may occur when you breathe too quickly (hyperventilation). It can also occur without any apparent cause. Commonly, paresthesia occurs when pressure is placed on a nerve. The sensation quickly goes away after the pressure is removed. For some people, however, paresthesia is a long-lasting (chronic) condition that is caused by an underlying disorder. If you continue to have paresthesia, you may need further medical evaluation. Follow these instructions at home: Watch your condition for any changes. Taking the following actions may help to lessen any discomfort that you are feeling:  Avoid drinking alcohol.  Try acupuncture or massage to help relieve your symptoms.  Keep all follow-up visits as directed by your health care provider. This is important.  Contact a health care provider if:  You continue to have episodes of paresthesia.  Your burning or prickling feeling gets worse when you walk.  You have  pain, cramps, or dizziness.  You develop a rash. Get help right away if:  You feel weak.  You have trouble walking or moving.  You have problems with speech, understanding, or vision.  You feel confused.  You cannot control your bladder or bowel movements.  You have numbness after an injury.  You faint. This information is not intended to replace advice given to you by your health care provider. Make sure you discuss any questions you have with your health care provider. Document Released: 02/24/2002 Document Revised: 08/12/2015 Document Reviewed: 03/02/2014 Elsevier Interactive Patient Education  [employer].

## 2017-09-05 NOTE — Progress Notes (Signed)
Pre visit review using our clinic review tool, if applicable. No additional management support is needed unless otherwise documented below in the visit note. 

## 2017-09-06 LAB — URINALYSIS, ROUTINE W REFLEX MICROSCOPIC
BILIRUBIN UA: NEGATIVE
Glucose, UA: NEGATIVE
Ketones, UA: NEGATIVE
NITRITE UA: NEGATIVE
PH UA: 6.5 (ref 5.0–7.5)
Protein, UA: NEGATIVE
RBC UA: NEGATIVE
Specific Gravity, UA: 1.014 (ref 1.005–1.030)
Urobilinogen, Ur: 0.2 mg/dL (ref 0.2–1.0)

## 2017-09-06 LAB — MICROSCOPIC EXAMINATION: Casts: NONE SEEN /lpf

## 2017-09-06 LAB — MICROALBUMIN / CREATININE URINE RATIO: Creatinine, Urine: 61 mg/dL

## 2017-09-06 LAB — HEPATITIS B SURFACE ANTIBODY, QUANTITATIVE

## 2017-09-06 LAB — MEASLES/MUMPS/RUBELLA IMMUNITY
Mumps IgG: 233 AU/mL
Rubella: 2.3 index
Rubeola IgG: <25 AU/mL — ABNORMAL LOW

## 2017-09-11 ENCOUNTER — Encounter: Payer: Self-pay | Admitting: Internal Medicine

## 2017-09-14 ENCOUNTER — Other Ambulatory Visit: Payer: Self-pay | Admitting: Internal Medicine

## 2017-09-14 ENCOUNTER — Ambulatory Visit
Admission: RE | Admit: 2017-09-14 | Discharge: 2017-09-14 | Disposition: A | Discharge status: Home / Self Care | Payer: 59 | Source: Ambulatory Visit | Attending: Internal Medicine | Admitting: Internal Medicine

## 2017-09-14 DIAGNOSIS — F32A Depression, unspecified: Secondary | ICD-10-CM

## 2017-09-14 DIAGNOSIS — Z1231 Encounter for screening mammogram for malignant neoplasm of breast: Secondary | ICD-10-CM | POA: Diagnosis not present

## 2017-09-14 DIAGNOSIS — F419 Anxiety disorder, unspecified: Principal | ICD-10-CM

## 2017-09-14 DIAGNOSIS — F329 Major depressive disorder, single episode, unspecified: Secondary | ICD-10-CM

## 2017-09-14 MED ORDER — FLUOXETINE HCL 20 MG PO TABS: 40.0000 mg | ORAL_TABLET | Freq: Every day | ORAL | 3 refills | Status: DC

## 2017-09-17 ENCOUNTER — Encounter: Payer: Self-pay | Admitting: Internal Medicine

## 2017-09-25 ENCOUNTER — Encounter: Payer: Self-pay | Admitting: Internal Medicine

## 2017-10-05 ENCOUNTER — Encounter: Payer: Self-pay | Admitting: Internal Medicine

## 2017-10-05 ENCOUNTER — Ambulatory Visit: Payer: Self-pay | Admitting: Internal Medicine

## 2017-10-05 ENCOUNTER — Ambulatory Visit (INDEPENDENT_AMBULATORY_CARE_PROVIDER_SITE_OTHER): Payer: 59 | Admitting: Internal Medicine

## 2017-10-05 VITALS — BP 128/84 | HR 87 | Temp 98.9°F | Ht 62.0 in | Wt >= 6400 oz

## 2017-10-05 DIAGNOSIS — E119 Type 2 diabetes mellitus without complications: Secondary | ICD-10-CM

## 2017-10-05 DIAGNOSIS — E785 Hyperlipidemia, unspecified: Secondary | ICD-10-CM | POA: Diagnosis not present

## 2017-10-05 DIAGNOSIS — I10 Essential (primary) hypertension: Secondary | ICD-10-CM | POA: Diagnosis not present

## 2017-10-05 MED ORDER — HYDROCHLOROTHIAZIDE 25 MG PO TABS: 25.0000 mg | ORAL_TABLET | Freq: Every day | ORAL | 0 refills | Status: DC

## 2017-10-05 NOTE — Patient Instructions (Addendum)
Dr. Dennard Nip [phone] [facility]  Consider Measles vaccine at health department and hepatitis B vaccine     Ref. Range 09/05/2017 09:22  Rubella Latest Units: index 2.30  Hepatitis B-Post Latest Ref Range: > OR = 10 mIU/mL <5 (L)  Mumps IgG Latest Units: AU/mL 233.00  Rubeola IgG Latest Units: AU/mL <25.00 (L)    Hepatitis B Vaccine, Recombinant injection What is this medicine? HEPATITIS B VACCINE (hep uh TAHY tis B VAK seen) is a vaccine. It is used to prevent an infection with the hepatitis B virus. This medicine may be used for other purposes; ask your health care provider or pharmacist if you have questions. COMMON BRAND NAME(S): Engerix-B, Recombivax HB What should I tell my health care provider before I take this medicine? They need to know if you have any of these conditions: -fever, infection -heart disease -hepatitis B infection -immune system problems -kidney disease -an unusual or allergic reaction to vaccines, yeast, other medicines, foods, dyes, or preservatives -pregnant or trying to get pregnant -breast-feeding How should I use this medicine? This vaccine is for injection into a muscle. It is given by a health care professional. A copy of Vaccine Information Statements will be given before each vaccination. Read this sheet carefully each time. The sheet may change frequently. Talk to your pediatrician regarding the use of this medicine in children. While this drug may be prescribed for children as young as newborn for selected conditions, precautions do apply. Overdosage: If you think you have taken too much of this medicine contact a poison control center or emergency room at once. NOTE: This medicine is only for you. Do not share this medicine with others. What if I miss a dose? It is important not to miss your dose. Call your doctor or health care professional if you are unable to keep an  appointment. What may interact with this medicine? -medicines that suppress your immune function like adalimumab, anakinra, infliximab -medicines to treat cancer -steroid medicines like prednisone or cortisone This list may not describe all possible interactions. Give your health care provider a list of all the medicines, herbs, non-prescription drugs, or dietary supplements you use. Also tell them if you smoke, drink alcohol, or use illegal drugs. Some items may interact with your medicine. What should I watch for while using this medicine? See your health care provider for all shots of this vaccine as directed. You must have 3 shots of this vaccine for protection from hepatitis B infection. Tell your doctor right away if you have any serious or unusual side effects after getting this vaccine. What side effects may I notice from receiving this medicine? Side effects that you should report to your doctor or health care professional as soon as possible: -allergic reactions like skin rash, itching or hives, swelling of the face, lips, or tongue -breathing problems -confused, irritated -fast, irregular heartbeat -flu-like syndrome -numb, tingling pain -seizures -unusually weak or tired Side effects that usually do not require medical attention (report to your doctor or health care professional if they continue or are bothersome): -diarrhea -fever -headache -loss of appetite -muscle pain -nausea -pain, redness, swelling, or irritation at site where injected -tiredness This list may not describe all possible side effects. Call your doctor for medical advice about side effects. You may report side effects to FDA at 1-800-FDA-1088. Where should I keep my medicine? This drug is given in a hospital or clinic and will not be  stored at home. NOTE: This sheet is a summary. It may not cover all possible information. If you have questions about this medicine, talk to your doctor, pharmacist, or  health care provider.  2018 Elsevier/Gold Standard (2013-07-07 13:26:01)   Hypertension Hypertension, commonly called high blood pressure, is when the force of blood pumping through the arteries is too strong. The arteries are the blood vessels that carry blood from the heart throughout the body. Hypertension forces the heart to work harder to pump blood and may cause arteries to become narrow or stiff. Having untreated or uncontrolled hypertension can cause heart attacks, strokes, kidney disease, and other problems. A blood pressure reading consists of a higher number over a lower number. Ideally, your blood pressure should be below 120/80. The first ("top") number is called the systolic pressure. It is a measure of the pressure in your arteries as your heart beats. The second ("bottom") number is called the diastolic pressure. It is a measure of the pressure in your arteries as the heart relaxes. What are the causes? The cause of this condition is not known. What increases the risk? Some risk factors for high blood pressure are under your control. Others are not. Factors you can change  Smoking.  Having type 2 diabetes mellitus, high cholesterol, or both.  Not getting enough exercise or physical activity.  Being overweight.  Having too much fat, sugar, calories, or salt (sodium) in your diet.  Drinking too much alcohol. Factors that are difficult or impossible to change  Having chronic kidney disease.  Having a family history of high blood pressure.  Age. Risk increases with age.  Race. You may be at higher risk if you are African-American.  Gender. Men are at higher risk than women before age 38. After age 65, women are at higher risk than men.  Having obstructive sleep apnea.  Stress. What are the signs or symptoms? Extremely high blood pressure (hypertensive crisis) may cause:  Headache.  Anxiety.  Shortness of breath.  Nosebleed.  Nausea and  vomiting.  Severe chest pain.  Jerky movements you cannot control (seizures).  How is this diagnosed? This condition is diagnosed by measuring your blood pressure while you are seated, with your arm resting on a surface. The cuff of the blood pressure monitor will be placed directly against the skin of your upper arm at the level of your heart. It should be measured at least twice using the same arm. Certain conditions can cause a difference in blood pressure between your right and left arms. Certain factors can cause blood pressure readings to be lower or higher than normal (elevated) for a short period of time:  When your blood pressure is higher when you are in a health care provider's office than when you are at home, this is called white coat hypertension. Most people with this condition do not need medicines.  When your blood pressure is higher at home than when you are in a health care provider's office, this is called masked hypertension. Most people with this condition may need medicines to control blood pressure.  If you have a high blood pressure reading during one visit or you have normal blood pressure with other risk factors:  You may be asked to return on a different day to have your blood pressure checked again.  You may be asked to monitor your blood pressure at home for 1 week or longer.  If you are diagnosed with hypertension, you may have other blood or  imaging tests to help your health care provider understand your overall risk for other conditions. How is this treated? This condition is treated by making healthy lifestyle changes, such as eating healthy foods, exercising more, and reducing your alcohol intake. Your health care provider may prescribe medicine if lifestyle changes are not enough to get your blood pressure under control, and if:  Your systolic blood pressure is above 130.  Your diastolic blood pressure is above 80.  Your personal target blood pressure  may vary depending on your medical conditions, your age, and other factors. Follow these instructions at home: Eating and drinking  Eat a diet that is high in fiber and potassium, and low in sodium, added sugar, and fat. An example eating plan is called the DASH (Dietary Approaches to Stop Hypertension) diet. To eat this way: ? Eat plenty of fresh fruits and vegetables. Try to fill half of your plate at each meal with fruits and vegetables. ? Eat whole grains, such as whole wheat pasta, brown rice, or whole grain bread. Fill about one quarter of your plate with whole grains. ? Eat or drink low-fat dairy products, such as skim milk or low-fat yogurt. ? Avoid fatty cuts of meat, processed or cured meats, and poultry with skin. Fill about one quarter of your plate with lean proteins, such as fish, chicken without skin, beans, eggs, and tofu. ? Avoid premade and processed foods. These tend to be higher in sodium, added sugar, and fat.  Reduce your daily sodium intake. Most people with hypertension should eat less than 1,500 mg of sodium a day.  Limit alcohol intake to no more than 1 drink a day for nonpregnant women and 2 drinks a day for men. One drink equals 12 oz of beer, 5 oz of wine, or 1 oz of hard liquor. Lifestyle  Work with your health care provider to maintain a healthy body weight or to lose weight. Ask what an ideal weight is for you.  Get at least 30 minutes of exercise that causes your heart to beat faster (aerobic exercise) most days of the week. Activities may include walking, swimming, or biking.  Include exercise to strengthen your muscles (resistance exercise), such as pilates or lifting weights, as part of your weekly exercise routine. Try to do these types of exercises for 30 minutes at least 3 days a week.  Do not use any products that contain nicotine or tobacco, such as cigarettes and e-cigarettes. If you need help quitting, ask your health care provider.  Monitor your  blood pressure at home as told by your health care provider.  Keep all follow-up visits as told by your health care provider. This is important. Medicines  Take over-the-counter and prescription medicines only as told by your health care provider. Follow directions carefully. Blood pressure medicines must be taken as prescribed.  Do not skip doses of blood pressure medicine. Doing this puts you at risk for problems and can make the medicine less effective.  Ask your health care provider about side effects or reactions to medicines that you should watch for. Contact a health care provider if:  You think you are having a reaction to a medicine you are taking.  You have headaches that keep coming back (recurring).  You feel dizzy.  You have swelling in your ankles.  You have trouble with your vision. Get help right away if:  You develop a severe headache or confusion.  You have unusual weakness or numbness.  You feel  faint.  You have severe pain in your chest or abdomen.  You vomit repeatedly.  You have trouble breathing. Summary  Hypertension is when the force of blood pumping through your arteries is too strong. If this condition is not controlled, it may put you at risk for serious complications.  Your personal target blood pressure may vary depending on your medical conditions, your age, and other factors. For most people, a normal blood pressure is less than 120/80.  Hypertension is treated with lifestyle changes, medicines, or a combination of both. Lifestyle changes include weight loss, eating a healthy, low-sodium diet, exercising more, and limiting alcohol. This information is not intended to replace advice given to you by your health care provider. Make sure you discuss any questions you have with your health care provider. Document Released: 03/06/2005 Document Revised: 02/02/2016 Document Reviewed: 02/02/2016 Elsevier Interactive Patient Education  2018 [employer].  Hydrochlorothiazide, HCTZ capsules or tablets What is this medicine? HYDROCHLOROTHIAZIDE (hye droe klor oh THYE a zide) is a diuretic. It increases the amount of urine passed, which causes the body to lose salt and water. This medicine is used to treat high blood pressure. It is also reduces the swelling and water retention caused by various medical conditions, such as heart, liver, or kidney disease. This medicine may be used for other purposes; ask your health care provider or pharmacist if you have questions. COMMON BRAND NAME(S): Esidrix, Ezide, HydroDIURIL, Microzide, Oretic, Zide What should I tell my health care provider before I take this medicine? They need to know if you have any of these conditions: -diabetes -gout -immune system problems, like lupus -kidney disease or kidney stones -liver disease -pancreatitis -small amount of urine or difficulty passing urine -an unusual or allergic reaction to hydrochlorothiazide, sulfa drugs, other medicines, foods, dyes, or preservatives -pregnant or trying to get pregnant -breast-feeding How should I use this medicine? Take this medicine by mouth with a glass of water. Follow the directions on the prescription label. Take your medicine at regular intervals. Remember that you will need to pass urine frequently after taking this medicine. Do not take your doses at a time of day that will cause you problems. Do not stop taking your medicine unless your doctor tells you to. Talk to your pediatrician regarding the use of this medicine in children. Special care may be needed. Overdosage: If you think you have taken too much of this medicine contact a poison control center or emergency room at once. NOTE: This medicine is only for you. Do not share this medicine with others. What if I miss a dose? If you miss a dose, take it as soon as you can. If it is almost time for your next dose, take only that dose. Do not take double or extra  doses. What may interact with this medicine? -cholestyramine -colestipol -digoxin -dofetilide -lithium -medicines for blood pressure -medicines for diabetes -medicines that relax muscles for surgery -other diuretics -steroid medicines like prednisone or cortisone This list may not describe all possible interactions. Give your health care provider a list of all the medicines, herbs, non-prescription drugs, or dietary supplements you use. Also tell them if you smoke, drink alcohol, or use illegal drugs. Some items may interact with your medicine. What should I watch for while using this medicine? Visit your doctor or health care professional for regular checks on your progress. Check your blood pressure as directed. Ask your doctor or health care professional what your blood pressure should be and  when you should contact him or her. You may need to be on a special diet while taking this medicine. Ask your doctor. Check with your doctor or health care professional if you get an attack of severe diarrhea, nausea and vomiting, or if you sweat a lot. The loss of too much body fluid can make it dangerous for you to take this medicine. You may get drowsy or dizzy. Do not drive, use machinery, or do anything that needs mental alertness until you know how this medicine affects you. Do not stand or sit up quickly, especially if you are an older patient. This reduces the risk of dizzy or fainting spells. Alcohol may interfere with the effect of this medicine. Avoid alcoholic drinks. This medicine may affect your blood sugar level. If you have diabetes, check with your doctor or health care professional before changing the dose of your diabetic medicine. This medicine can make you more sensitive to the sun. Keep out of the sun. If you cannot avoid being in the sun, wear protective clothing and use sunscreen. Do not use sun lamps or tanning beds/booths. What side effects may I notice from receiving this  medicine? Side effects that you should report to your doctor or health care professional as soon as possible: -allergic reactions such as skin rash or itching, hives, swelling of the lips, mouth, tongue, or throat -changes in vision -chest pain -eye pain -fast or irregular heartbeat -feeling faint or lightheaded, falls -gout attack -muscle pain or cramps -pain or difficulty when passing urine -pain, tingling, numbness in the hands or feet -redness, blistering, peeling or loosening of the skin, including inside the mouth -unusually weak or tired Side effects that usually do not require medical attention (report to your doctor or health care professional if they continue or are bothersome): -change in sex drive or performance -dry mouth -headache -stomach upset This list may not describe all possible side effects. Call your doctor for medical advice about side effects. You may report side effects to FDA at 1-800-FDA-1088. Where should I keep my medicine? Keep out of the reach of children. Store at room temperature between 15 and 30 degrees C (59 and 86 degrees F). Do not freeze. Protect from light and moisture. Keep container closed tightly. Throw away any unused medicine after the expiration date. NOTE: This sheet is a summary. It may not cover all possible information. If you have questions about this medicine, talk to your doctor, pharmacist, or health care provider.  2018 Elsevier/Gold Standard (2009-10-29 12:57:37)  DASH Eating Plan DASH stands for "Dietary Approaches to Stop Hypertension." The DASH eating plan is a healthy eating plan that has been shown to reduce high blood pressure (hypertension). It may also reduce your risk for type 2 diabetes, heart disease, and stroke. The DASH eating plan may also help with weight loss. What are tips for following this plan? General guidelines  Avoid eating more than 2,300 mg (milligrams) of salt (sodium) a day. If you have hypertension,  you may need to reduce your sodium intake to 1,500 mg a day.  Limit alcohol intake to no more than 1 drink a day for nonpregnant women and 2 drinks a day for men. One drink equals 12 oz of beer, 5 oz of wine, or 1 oz of hard liquor.  Work with your health care provider to maintain a healthy body weight or to lose weight. Ask what an ideal weight is for you.  Get at least 30 minutes of exercise  that causes your heart to beat faster (aerobic exercise) most days of the week. Activities may include walking, swimming, or biking.  Work with your health care provider or diet and nutrition specialist (dietitian) to adjust your eating plan to your individual calorie needs. Reading food labels  Check food labels for the amount of sodium per serving. Choose foods with less than 5 percent of the Daily Value of sodium. Generally, foods with less than 300 mg of sodium per serving fit into this eating plan.  To find whole grains, look for the word "whole" as the first word in the ingredient list. Shopping  Buy products labeled as "low-sodium" or "no salt added."  Buy fresh foods. Avoid canned foods and premade or frozen meals. Cooking  Avoid adding salt when cooking. Use salt-free seasonings or herbs instead of table salt or sea salt. Check with your health care provider or pharmacist before using salt substitutes.  Do not fry foods. Cook foods using healthy methods such as baking, boiling, grilling, and broiling instead.  Cook with heart-healthy oils, such as olive, canola, soybean, or sunflower oil. Meal planning   Eat a balanced diet that includes: ? 5 or more servings of fruits and vegetables each day. At each meal, try to fill half of your plate with fruits and vegetables. ? Up to 6-8 servings of whole grains each day. ? Less than 6 oz of lean meat, poultry, or fish each day. A 3-oz serving of meat is about the same size as a deck of cards. One egg equals 1 oz. ? 2 servings of low-fat dairy  each day. ? A serving of nuts, seeds, or beans 5 times each week. ? Heart-healthy fats. Healthy fats called Omega-3 fatty acids are found in foods such as flaxseeds and coldwater fish, like sardines, salmon, and mackerel.  Limit how much you eat of the following: ? Canned or prepackaged foods. ? Food that is high in trans fat, such as fried foods. ? Food that is high in saturated fat, such as fatty meat. ? Sweets, desserts, sugary drinks, and other foods with added sugar. ? Full-fat dairy products.  Do not salt foods before eating.  Try to eat at least 2 vegetarian meals each week.  Eat more home-cooked food and less restaurant, buffet, and fast food.  When eating at a restaurant, ask that your food be prepared with less salt or no salt, if possible. What foods are recommended? The items listed may not be a complete list. Talk with your dietitian about what dietary choices are best for you. Grains Whole-grain or whole-wheat bread. Whole-grain or whole-wheat pasta. Brown rice. [place]. Bulgur. Whole-grain and low-sodium cereals. Pita bread. Low-fat, low-sodium crackers. Whole-wheat flour tortillas. Vegetables Fresh or frozen vegetables (raw, steamed, roasted, or grilled). Low-sodium or reduced-sodium tomato and vegetable juice. Low-sodium or reduced-sodium tomato sauce and tomato paste. Low-sodium or reduced-sodium canned vegetables. Fruits All fresh, dried, or frozen fruit. Canned fruit in natural juice (without added sugar). Meat and other protein foods Skinless chicken or [place]. Ground chicken or [place]. Pork with fat trimmed off. Fish and seafood. Egg whites. Dried beans, peas, or lentils. Unsalted nuts, nut butters, and seeds. Unsalted canned beans. Lean cuts of beef with fat trimmed off. Low-sodium, lean deli meat. Dairy Low-fat (1%) or fat-free (skim) milk. Fat-free, low-fat, or reduced-fat cheeses. Nonfat, low-sodium ricotta or cottage cheese. Low-fat or nonfat yogurt.  Low-fat, low-sodium cheese. Fats and oils Soft margarine without trans fats. Vegetable oil. Low-fat, reduced-fat, or  light mayonnaise and salad dressings (reduced-sodium). Canola, safflower, olive, soybean, and sunflower oils. Avocado. Seasoning and other foods Herbs. Spices. Seasoning mixes without salt. Unsalted popcorn and pretzels. Fat-free sweets. What foods are not recommended? The items listed may not be a complete list. Talk with your dietitian about what dietary choices are best for you. Grains Baked goods made with fat, such as croissants, muffins, or some breads. Dry pasta or rice meal packs. Vegetables Creamed or fried vegetables. Vegetables in a cheese sauce. Regular canned vegetables (not low-sodium or reduced-sodium). Regular canned tomato sauce and paste (not low-sodium or reduced-sodium). Regular tomato and vegetable juice (not low-sodium or reduced-sodium). Angie Fava. Olives. Fruits Canned fruit in a light or heavy syrup. Fried fruit. Fruit in cream or butter sauce. Meat and other protein foods Fatty cuts of meat. Ribs. Fried meat. Berniece Salines. Sausage. Bologna and other processed lunch meats. Salami. Fatback. Hotdogs. Bratwurst. Salted nuts and seeds. Canned beans with added salt. Canned or smoked fish. Whole eggs or egg yolks. Chicken or [place] with skin. Dairy Whole or 2% milk, cream, and half-and-half. Whole or full-fat cream cheese. Whole-fat or sweetened yogurt. Full-fat cheese. Nondairy creamers. Whipped toppings. Processed cheese and cheese spreads. Fats and oils Butter. Stick margarine. Lard. Shortening. Ghee. Bacon fat. Tropical oils, such as coconut, palm kernel, or palm oil. Seasoning and other foods Salted popcorn and pretzels. Onion salt, garlic salt, seasoned salt, table salt, and sea salt. Worcestershire sauce. Tartar sauce. Barbecue sauce. Teriyaki sauce. Soy sauce, including reduced-sodium. Steak sauce. Canned and packaged gravies. Fish sauce. Oyster sauce. Cocktail  sauce. Horseradish that you find on the shelf. Ketchup. Mustard. Meat flavorings and tenderizers. Bouillon cubes. Hot sauce and Tabasco sauce. Premade or packaged marinades. Premade or packaged taco seasonings. Relishes. Regular salad dressings. Where to find more information:  National Heart, Lung, and [place]: https://wilson-eaton.com/  American Heart Association: www.heart.org Summary  The DASH eating plan is a healthy eating plan that has been shown to reduce high blood pressure (hypertension). It may also reduce your risk for type 2 diabetes, heart disease, and stroke.  With the DASH eating plan, you should limit salt (sodium) intake to 2,300 mg a day. If you have hypertension, you may need to reduce your sodium intake to 1,500 mg a day.  When on the DASH eating plan, aim to eat more fresh fruits and vegetables, whole grains, lean proteins, low-fat dairy, and heart-healthy fats.  Work with your health care provider or diet and nutrition specialist (dietitian) to adjust your eating plan to your individual calorie needs. This information is not intended to replace advice given to you by your health care provider. Make sure you discuss any questions you have with your health care provider. Document Released: 02/23/2011 Document Revised: 02/28/2016 Document Reviewed: 02/28/2016 Elsevier Interactive Patient Education  2018 [employer]. Cholesterol Cholesterol is a white, waxy, fat-like substance that is needed by the human body in small amounts. The liver makes all the cholesterol we need. Cholesterol is carried from the liver by the blood through the blood vessels. Deposits of cholesterol (plaques) may build up on blood vessel (artery) walls. Plaques make the arteries narrower and stiffer. Cholesterol plaques increase the risk for heart attack and stroke. You cannot feel your cholesterol level even if it is very high. The only way to know that it is high is to have a blood test. Once you know  your cholesterol levels, you should keep a record of the test results. Work with your health care provider  to keep your levels in the desired range. What do the results mean?  Total cholesterol is a rough measure of all the cholesterol in your blood.  LDL (low-density lipoprotein) is the "bad" cholesterol. This is the type that causes plaque to build up on the artery walls. You want this level to be low.  HDL (high-density lipoprotein) is the "good" cholesterol because it cleans the arteries and carries the LDL away. You want this level to be high.  Triglycerides are fat that the body can either burn for energy or store. High levels are closely linked to heart disease. What are the desired levels of cholesterol?  Total cholesterol below 200.  LDL below 100 for people who are at risk, below 70 for people at very high risk.  HDL above 40 is good. A level of 60 or higher is considered to be protective against heart disease.  Triglycerides below 150. How can I lower my cholesterol? Diet Follow your diet program as told by your health care provider.  Choose fish or white meat chicken and [place], roasted or baked. Limit fatty cuts of red meat, fried foods, and processed meats, such as sausage and lunch meats.  Eat lots of fresh fruits and vegetables.  Choose whole grains, beans, pasta, potatoes, and cereals.  Choose olive oil, corn oil, or canola oil, and use only small amounts.  Avoid butter, mayonnaise, shortening, or palm kernel oils.  Avoid foods with trans fats.  Drink skim or nonfat milk and eat low-fat or nonfat yogurt and cheeses. Avoid whole milk, cream, ice cream, egg yolks, and full-fat cheeses.  Healthier desserts include angel food cake, ginger snaps, animal crackers, hard candy, popsicles, and low-fat or nonfat frozen yogurt. Avoid pastries, cakes, pies, and cookies.  Exercise  Follow your exercise program as told by your health care provider. A regular  program: ? Helps to decrease LDL and raise HDL. ? Helps with weight control.  Do things that increase your activity level, such as gardening, walking, and taking the stairs.  Ask your health care provider about ways that you can be more active in your daily life.  Medicine  Take over-the-counter and prescription medicines only as told by your health care provider. ? Medicine may be prescribed by your health care provider to help lower cholesterol and decrease the risk for heart disease. This is usually done if diet and exercise have failed to bring down cholesterol levels. ? If you have several risk factors, you may need medicine even if your levels are normal.  This information is not intended to replace advice given to you by your health care provider. Make sure you discuss any questions you have with your health care provider. Document Released: 11/29/2000 Document Revised: 10/02/2015 Document Reviewed: 09/04/2015 Elsevier Interactive Patient Education  2018 [employer] Vaccines, MMR injection What is this medicine? MEASLES VIRUS; MUMPS VIRUS; RUBELLA VIRUS VACCINE LIVE (MEE zuhlz VAHY ruhs; muhmps VAHY ruhs; roo bel uh VAHY ruhs vak SEEN [facility] ) is used to prevent an infection with measles (rubeola), mumps, and rubella ([name] measles) viruses. It is used to prevent infection in children over 4 months old, adults that have not been vaccinated and are not pregnant, and anyone traveling to countries where there are high rates of measles, mumps, or rubella. This medicine may be used for other purposes; ask your health care provider or pharmacist if you have questions. COMMON BRAND NAME(S): M-M-R II What should I tell my health care provider before  I take this medicine? They need to know if you have any of these conditions: -bleeding disorder -cancer including leukemia or lymphoma -immune system problems -infection with fever -low levels of platelets in the  blood -recent blood transfusion or immune globulin infusion -seizure disorder -taking medicines for immunosuppression -an unusual or allergic reaction to vaccines, eggs, neomycin, gelatin, other medicines, foods, dyes, or preservatives -pregnant or trying to get pregnant -breast-feeding How should I use this medicine? This vaccine is for injection under the skin. It is given by a health care professional. A copy of Vaccine Information Statements will be given before each vaccination. Read this sheet carefully each time. The sheet may change frequently. Talk to your pediatrician regarding the use of this medicine in children. While this drug may be prescribed for children as young as 61 months of age for selected conditions, precautions do apply. Overdosage: If you think you have taken too much of this medicine contact a poison control center or emergency room at once. NOTE: This medicine is only for you. Do not share this medicine with others. What if I miss a dose? Keep appointments for follow-up (booster) doses as directed. It is important not to miss your dose. Call your doctor or health care professional if you are unable to keep an appointment. What may interact with this medicine? Do not take this medicine with any of the following medications: -adalimumab -anakinra -etanercept -infliximab -medicines that suppress your immune system -medicines to treat cancer This medicine may also interact with the following medications: -immune globulins -live virus vaccines This list may not describe all possible interactions. Give your health care provider a list of all the medicines, herbs, non-prescription drugs, or dietary supplements you use. Also tell them if you smoke, drink alcohol, or use illegal drugs. Some items may interact with your medicine. What should I watch for while using this medicine? Visit your doctor for check-ups as directed. Do not become pregnant for 3 months after  receiving this vaccine. Women should inform their doctor if they wish to become pregnant or think they might be pregnant. There is a potential for serious side effects to an unborn child. Talk to your health care professional or pharmacist for more information. What side effects may I notice from receiving this medicine? Side effects that you should report to your doctor or health care professional as soon as possible: -allergic reactions like skin rash, itching or hives, swelling of the face, lips, or tongue -breathing problems -changes in hearing -changes in vision -difficulty walking -extreme changes in behavior -fast, irregular heartbeat -fever over 100 degrees F -pain, tingling, numbness in the hands or feet -seizures -unusual bleeding or bruising -unusually weak or tired Side effects that usually do not require medical attention (report to your doctor or health care professional if they continue or are bothersome): -aches or pains -bruising, pain, swelling at site where injected -diarrhea -headache -low-grade fever of 100 degrees F or less -nausea, vomiting -runny nose, cough -sleepy -swollen glands This list may not describe all possible side effects. Call your doctor for medical advice about side effects. You may report side effects to FDA at 1-800-FDA-1088. Where should I keep my medicine? This drug is given in a hospital or clinic and will not be stored at home. NOTE: This sheet is a summary. It may not cover all possible information. If you have questions about this medicine, talk to your doctor, pharmacist, or health care provider.  2018 Elsevier/Gold Standard (2013-04-04 11:04:43)

## 2017-10-05 NOTE — Progress Notes (Signed)
Chief Complaint  Patient presents with  . Follow-up    htn   F/u  1. Obesity lost 8 lbs with changing diet will refer wt loss clinic  2. HTN uncontrolled on lis 30 mg qd  3. DM 2 A1C 5.9    Review of Systems  Constitutional: Positive for weight loss.  HENT: Negative for hearing loss.   Eyes: Negative for blurred vision.  Respiratory: Negative for shortness of breath.   Cardiovascular: Negative for chest pain.  Gastrointestinal: Negative for abdominal pain.  Musculoskeletal: Negative for falls.  Skin: Negative for rash.  Neurological: Negative for headaches.  Psychiatric/Behavioral: Negative for depression.   Past Medical History:  Diagnosis Date  . Abnormal menses   . Allergy   . Arthritis   . Chicken pox   . Depression   . Diabetes mellitus without complication ([place])   . Hypertension   . Lymphedema   . Prediabetes   . Seasonal allergies   . Stasis dermatitis of both legs    Past Surgical History:  Procedure Laterality Date  . WISDOM TOOTH EXTRACTION     Family History  Problem Relation Age of Onset  . Arthritis Mother   . Depression Mother   . Hypertension Mother   . Depression Father   . Parkinson's disease Father   . Dementia Father   . Arthritis Maternal Grandmother   . Heart disease Maternal Grandmother   . Hypertension Maternal Grandmother   . Hyperlipidemia Maternal Grandmother   . Stroke Maternal Grandmother   . Alcohol abuse Maternal Grandfather   . Arthritis Maternal Grandfather   . Depression Maternal Grandfather   . Hearing loss Maternal Grandfather   . Heart disease Maternal Grandfather   . Stroke Maternal Grandfather   . Arthritis Paternal Grandmother   . Arthritis Paternal Grandfather   . Depression Paternal Grandfather   . Heart disease Paternal Grandfather   . Hypertension Paternal Grandfather   . Stroke Paternal Grandfather   . Breast cancer Neg Hx    Social History   Socioeconomic History  . Marital status: Single    Spouse  name: Not on file  . Number of children: Not on file  . Years of education: Not on file  . Highest education level: Not on file  Occupational History  . Not on file  Social Needs  . Financial resource strain: Not on file  . Food insecurity:    Worry: Not on file    Inability: Not on file  . Transportation needs:    Medical: Not on file    Non-medical: Not on file  Tobacco Use  . Smoking status: Never Smoker  . Smokeless tobacco: Never Used  Substance and Sexual Activity  . Alcohol use: Not Currently    Alcohol/week: 0.0 oz  . Drug use: Not Currently  . Sexual activity: Never  Lifestyle  . Physical activity:    Days per week: Not on file    Minutes per session: Not on file  . Stress: Not on file  Relationships  . Social connections:    Talks on phone: Not on file    Gets together: Not on file    Attends religious service: Not on file    Active member of club or organization: Not on file    Attends meetings of clubs or organizations: Not on file    Relationship status: Not on file  . Intimate partner violence:    Fear of current or ex partner: Not on file  Emotionally abused: Not on file    Physically abused: Not on file    Forced sexual activity: Not on file  Other Topics Concern  . Not on file  Social History Narrative   Works for [employer]    No kids    No guns    Wears seat belt    Safe in relationship    Current Meds  Medication Sig  . Cyanocobalamin (VITAMIN B-12 PO) Take 2,500 mcg by mouth daily.   [name] FINACEA 15 % cream   . FLUoxetine (PROZAC) 20 MG tablet Take 2 tablets (40 mg total) by mouth daily.  [name] glucosamine-chondroitin 500-400 MG tablet Take 1 tablet by mouth 3 (three) times daily.  [name] lisinopril (PRINIVIL,ZESTRIL) 30 MG tablet Take 1 tablet (30 mg total) by mouth daily.  [name] loratadine (CLARITIN) 10 MG tablet Take 10 mg by mouth daily.  . medroxyPROGESTERone (PROVERA) 2.5 MG tablet Take 2.5 mg by mouth daily.   . metFORMIN (GLUCOPHAGE) 500 MG tablet  daily with food  . Naproxen Sodium (ALEVE) 220 MG CAPS Take 440 mg by mouth daily.  . Omega-3 Fatty Acids (FISH OIL) 1000 MG CAPS Take 1,000 mg by mouth daily.   Allergies  Allergen Reactions  . Dust Mite Extract   . Latex   . Pollen Extract    Recent Results (from the past 2160 hour(s))  Comprehensive metabolic panel     Status: Abnormal   Collection Time: 09/05/17  9:17 AM  Result Value Ref Range   Sodium 139 135 - 145 mEq/L   Potassium 4.5 3.5 - 5.1 mEq/L   Chloride 102 96 - 112 mEq/L   CO2 29 19 - 32 mEq/L   Glucose, Bld 101 (H) 70 - 99 mg/dL   BUN 17 6 - 23 mg/dL   Creatinine, Ser 0.82 0.40 - 1.20 mg/dL   Total Bilirubin 0.9 0.2 - 1.2 mg/dL   Alkaline Phosphatase 62 39 - 117 U/L   AST 12 0 - 37 U/L   ALT 21 0 - 35 U/L   Total Protein 7.3 6.0 - 8.3 g/dL   Albumin 4.1 3.5 - 5.2 g/dL   Calcium 9.7 8.4 - 10.5 mg/dL   GFR 78.91 >60.00 mL/min  CBC with Differential/Platelet     Status: Abnormal   Collection Time: 09/05/17  9:17 AM  Result Value Ref Range   WBC 12.1 (H) 4.0 - 10.5 K/uL   RBC 4.96 3.87 - 5.11 Mil/uL   Hemoglobin 13.2 12.0 - 15.0 g/dL   HCT 40.1 36.0 - 46.0 %   MCV 80.8 78.0 - 100.0 fl   MCHC 33.0 30.0 - 36.0 g/dL   RDW 16.7 (H) 11.5 - 15.5 %   Platelets 262.0 150.0 - 400.0 K/uL   Neutrophils Relative % 72.7 43.0 - 77.0 %   Lymphocytes Relative 20.4 12.0 - 46.0 %   Monocytes Relative 4.6 3.0 - 12.0 %   Eosinophils Relative 1.6 0.0 - 5.0 %   Basophils Relative 0.7 0.0 - 3.0 %   Neutro Abs 8.8 (H) 1.4 - 7.7 K/uL   Lymphs Abs 2.5 0.7 - 4.0 K/uL   Monocytes Absolute 0.6 0.1 - 1.0 K/uL   Eosinophils Absolute 0.2 0.0 - 0.7 K/uL   Basophils Absolute 0.1 0.0 - 0.1 K/uL  Lipid panel     Status: Abnormal   Collection Time: 09/05/17  9:17 AM  Result Value Ref Range   Cholesterol 179 0 - 200 mg/dL    Comment: ATP III Classification  Desirable:  < 200 mg/dL               Borderline High:  200 - 239 mg/dL          High:  > = 240 mg/dL   Triglycerides 95.0  0.0 - 149.0 mg/dL    Comment: Normal:  <150 mg/dLBorderline High:  150 - 199 mg/dL   HDL 49.80 >39.00 mg/dL   VLDL 19.0 0.0 - 40.0 mg/dL   LDL Cholesterol 110 (H) 0 - 99 mg/dL   Total CHOL/HDL Ratio 4     Comment:                Men          Women1/2 Average Risk     3.4          3.3Average Risk          5.0          4.42X Average Risk          9.6          7.13X Average Risk          15.0          11.0                       NonHDL 128.75     Comment: NOTE:  Non-HDL goal should be 30 mg/dL higher than patient's LDL goal (i.e. LDL goal of < 70 mg/dL, would have non-HDL goal of < 100 mg/dL)  TSH     Status: None   Collection Time: 09/05/17  9:17 AM  Result Value Ref Range   TSH 1.91 0.35 - 4.50 uIU/mL  T4, free     Status: None   Collection Time: 09/05/17  9:17 AM  Result Value Ref Range   Free T4 1.05 0.60 - 1.60 ng/dL    Comment: Specimens from patients who are undergoing biotin therapy and /or ingesting biotin supplements may contain high levels of biotin.  The higher biotin concentration in these specimens interferes with this Free T4 assay.  Specimens that contain high levels  of biotin may cause false high results for this Free T4 assay.  Please interpret results in light of the total clinical presentation of the patient.    Hemoglobin A1c     Status: None   Collection Time: 09/05/17  9:17 AM  Result Value Ref Range   Hgb A1c MFr Bld 5.9 4.6 - 6.5 %    Comment: Glycemic Control Guidelines for People with Diabetes:Non Diabetic:  <6%Goal of Therapy: <7%Additional Action Suggested:  >8%   VITAMIN D 25 Hydroxy (Vit-D Deficiency, Fractures)     Status: None   Collection Time: 09/05/17  9:17 AM  Result Value Ref Range   VITD 42.73 30.00 - 100.00 ng/mL  B12     Status: Abnormal   Collection Time: 09/05/17  9:17 AM  Result Value Ref Range   Vitamin B-12 >1500 (H) 211 - 911 pg/mL  Urinalysis, Routine w reflex microscopic     Status: Abnormal   Collection Time: 09/05/17  9:22 AM  Result  Value Ref Range   Specific Gravity, UA 1.014 1.005 - 1.030   pH, UA 6.5 5.0 - 7.5   Color, UA Yellow Yellow   Appearance Ur Clear Clear   Leukocytes, UA 1+ (A) Negative   Protein, UA Negative Negative/Trace   Glucose, UA Negative Negative   Ketones, UA Negative Negative   RBC,  UA Negative Negative   Bilirubin, UA Negative Negative   Urobilinogen, Ur 0.2 0.2 - 1.0 mg/dL   Nitrite, UA Negative Negative   Microscopic Examination See below:     Comment: Microscopic was indicated and was performed.  Urine Microalbumin w/creat. ratio     Status: None   Collection Time: 09/05/17  9:22 AM  Result Value Ref Range   Creatinine, Urine 61.0 Not Estab. mg/dL   Microalbumin, Urine <3.0 Not Estab. ug/mL    Comment: **Verified by repeat analysis**   Microalb/Creat Ratio <4.9 0.0 - 30.0 mg/g creat    Comment:                      Normal:                0.0 -  30.0                      Albuminuria:          31.0 - 300.0                      Clinical albuminuria:       >300.0   Hepatitis B surface antibody     Status: Abnormal   Collection Time: 09/05/17  9:22 AM  Result Value Ref Range   Hepatitis B-Post <5 (L) > OR = 10 mIU/mL    Comment: . Patient does not have immunity to hepatitis B virus. . For additional information, please refer to http://education.[facility].com/faq/FAQ105 (This link is being provided for informational/ educational purposes only).   Measles/Mumps/Rubella Immunity     Status: Abnormal   Collection Time: 09/05/17  9:22 AM  Result Value Ref Range   Rubeola IgG <25.00 (L) AU/mL    Comment: AU/mL            Interpretation -----            -------------- <25.00           Negative 25.00-29.99      Equivocal >29.99           Positive . A positive result indicates that the patient has antibody to measles virus. It does not differentiate  between an active or past infection. The clinical  diagnosis must be interpreted in conjunction with  clinical signs and  symptoms of the patient.    Mumps IgG 233.00 AU/mL    Comment:  AU/mL           Interpretation -------         ---------------- <9.00             Negative 9.00-10.99        Equivocal >10.99            Positive A positive result indicates that the patient has  antibody to mumps virus. It does not differentiate between an  active or past infection. The clinical diagnosis must be interpreted in conjunction with clinical signs and symptoms of the patient. .    Rubella 2.30 index    Comment:     Index            Interpretation     -----            --------------       <0.90            Not consistent with Immunity     0.90-0.99        Equivocal     >  or = 1.00      Consistent with Immunity  . The presence of rubella IgG antibody suggests  immunization or past or current infection with rubella virus.   Microscopic Examination     Status: Abnormal   Collection Time: 09/05/17  9:22 AM  Result Value Ref Range   WBC, UA 6-10 (A) 0 - 5 /hpf   RBC, UA 0-2 0 - 2 /hpf   Epithelial Cells (non renal) 0-10 0 - 10 /hpf   Casts None seen None seen /lpf   Bacteria, UA Few None seen/Few   Objective  Body mass index is 86.48 kg/m. Wt Readings from Last 3 Encounters:  10/05/17 (!) 472 lb 12.8 oz (214.5 kg)  09/05/17 (!) 480 lb (217.7 kg)  05/07/17 (!) 470 lb (213.2 kg)   Temp Readings from Last 3 Encounters:  10/05/17 98.9 F (37.2 C) (Oral)  09/05/17 98.9 F (37.2 C) (Oral)  05/07/17 98.8 F (37.1 C)   BP Readings from Last 3 Encounters:  10/05/17 128/84  09/05/17 (!) 150/100  05/07/17 (!) 172/87   Pulse Readings from Last 3 Encounters:  10/05/17 87  09/05/17 96  05/07/17 96    Physical Exam  Constitutional: She is oriented to person, place, and time. Vital signs are normal. She appears well-developed and well-nourished. She is cooperative.  HENT:  Head: Normocephalic and atraumatic.  Mouth/Throat: Oropharynx is clear and moist and mucous membranes are normal.  Eyes:  Pupils are equal, round, and reactive to light. Conjunctivae are normal.  Cardiovascular: Normal rate, regular rhythm and normal heart sounds.  Pulmonary/Chest: Effort normal and breath sounds normal.  Neurological: She is alert and oriented to person, place, and time. Gait normal.  Skin: Skin is warm, dry and intact.  Psychiatric: She has a normal mood and affect. Her speech is normal and behavior is normal. Judgment and thought content normal. Cognition and memory are normal.  Nursing note and vitals reviewed.   Assessment   1. Obesity BMI>87 2. HTN/Hld 3. DM 2 A1C 5.9  4. HM Plan   1.  Refer to wt loss clinic  2.  Add hctz 25 mg qd when refill refill lis 20-25 2 pills daily  3.  Cont meds  4.  Had flu shot  UTD Tdap  rec MMR and hep B vaccine pt to think about Declines STD testing  Pap 02/02/16 neg neg HPV 09/14/17 mammogram neg  Derm is Dr. Evorn Gong   Pending appt Dr. Melrose Nakayama next month  Consider H/O in future       Provider: Dr. Olivia Mackie [facility]

## 2017-10-05 NOTE — Progress Notes (Signed)
Pre visit review using our clinic review tool, if applicable. No additional management support is needed unless otherwise documented below in the visit note. 

## 2017-11-05 ENCOUNTER — Encounter: Payer: Self-pay | Admitting: Internal Medicine

## 2017-11-07 NOTE — Telephone Encounter (Signed)
Patient is calling for an update. She said that she has not heard back from Dr Olivia Mackie. Call back # [phone]

## 2017-12-12 ENCOUNTER — Other Ambulatory Visit: Payer: Self-pay | Admitting: Internal Medicine

## 2017-12-12 DIAGNOSIS — I10 Essential (primary) hypertension: Secondary | ICD-10-CM

## 2017-12-12 MED ORDER — HYDROCHLOROTHIAZIDE 25 MG PO TABS: 25.0000 mg | ORAL_TABLET | Freq: Every day | ORAL | 3 refills | Status: DC

## 2017-12-18 ENCOUNTER — Encounter (INDEPENDENT_AMBULATORY_CARE_PROVIDER_SITE_OTHER): Payer: Self-pay

## 2017-12-24 ENCOUNTER — Encounter (INDEPENDENT_AMBULATORY_CARE_PROVIDER_SITE_OTHER): Payer: Self-pay | Admitting: Bariatrics

## 2017-12-24 ENCOUNTER — Ambulatory Visit (INDEPENDENT_AMBULATORY_CARE_PROVIDER_SITE_OTHER): Payer: 59 | Admitting: Bariatrics

## 2017-12-24 VITALS — BP 108/71 | HR 81 | Temp 98.1°F | Ht 61.0 in | Wt >= 6400 oz

## 2017-12-24 DIAGNOSIS — R5383 Other fatigue: Secondary | ICD-10-CM | POA: Diagnosis not present

## 2017-12-24 DIAGNOSIS — R0602 Shortness of breath: Secondary | ICD-10-CM | POA: Diagnosis not present

## 2017-12-24 DIAGNOSIS — I10 Essential (primary) hypertension: Secondary | ICD-10-CM

## 2017-12-24 DIAGNOSIS — Z9189 Other specified personal risk factors, not elsewhere classified: Secondary | ICD-10-CM | POA: Diagnosis not present

## 2017-12-24 DIAGNOSIS — Z1331 Encounter for screening for depression: Secondary | ICD-10-CM | POA: Diagnosis not present

## 2017-12-24 DIAGNOSIS — Z0289 Encounter for other administrative examinations: Secondary | ICD-10-CM

## 2017-12-24 DIAGNOSIS — E538 Deficiency of other specified B group vitamins: Secondary | ICD-10-CM

## 2017-12-24 DIAGNOSIS — F3289 Other specified depressive episodes: Secondary | ICD-10-CM

## 2017-12-24 DIAGNOSIS — Z6841 Body Mass Index (BMI) 40.0 and over, adult: Secondary | ICD-10-CM

## 2017-12-24 DIAGNOSIS — E559 Vitamin D deficiency, unspecified: Secondary | ICD-10-CM | POA: Diagnosis not present

## 2017-12-24 DIAGNOSIS — R7303 Prediabetes: Secondary | ICD-10-CM | POA: Diagnosis not present

## 2017-12-25 LAB — COMPREHENSIVE METABOLIC PANEL
ALBUMIN: 4.3 g/dL (ref 3.5–5.5)
ALT: 24 IU/L (ref 0–32)
AST: 19 IU/L (ref 0–40)
Albumin/Globulin Ratio: 2 (ref 1.2–2.2)
Alkaline Phosphatase: 69 IU/L (ref 39–117)
BUN / CREAT RATIO: 18 (ref 9–23)
BUN: 16 mg/dL (ref 6–24)
Bilirubin Total: 0.5 mg/dL (ref 0.0–1.2)
CALCIUM: 9.5 mg/dL (ref 8.7–10.2)
CO2: 23 mmol/L (ref 20–29)
Chloride: 101 mmol/L (ref 96–106)
Creatinine, Ser: 0.91 mg/dL (ref 0.57–1.00)
GFR calc Af Amer: 86 mL/min/{1.73_m2} (ref >59)
GFR calc non Af Amer: 75 mL/min/{1.73_m2} (ref >59)
GLUCOSE: 102 mg/dL — AB (ref 65–99)
Globulin, Total: 2.2 g/dL (ref 1.5–4.5)
Potassium: 4.9 mmol/L (ref 3.5–5.2)
Sodium: 140 mmol/L (ref 134–144)
TOTAL PROTEIN: 6.5 g/dL (ref 6.0–8.5)

## 2017-12-25 LAB — CBC WITH DIFFERENTIAL
BASOS: 0 %
Basophils Absolute: 0.1 10*3/uL (ref 0.0–0.2)
EOS (ABSOLUTE): 0.3 10*3/uL (ref 0.0–0.4)
Eos: 2 %
Hematocrit: 38.9 % (ref 34.0–46.6)
Hemoglobin: 12.1 g/dL (ref 11.1–15.9)
Immature Grans (Abs): 0 10*3/uL (ref 0.0–0.1)
Immature Granulocytes: 0 %
Lymphocytes Absolute: 2.5 10*3/uL (ref 0.7–3.1)
Lymphs: 22 %
MCH: 24.5 pg — AB (ref 26.6–33.0)
MCHC: 31.1 g/dL — ABNORMAL LOW (ref 31.5–35.7)
MCV: 79 fL (ref 79–97)
Monocytes Absolute: 0.5 10*3/uL (ref 0.1–0.9)
Monocytes: 5 %
NEUTROS PCT: 71 %
Neutrophils Absolute: 8.2 10*3/uL — ABNORMAL HIGH (ref 1.4–7.0)
RBC: 4.93 x10E6/uL (ref 3.77–5.28)
RDW: 14.6 % (ref 12.3–15.4)
WBC: 11.6 10*3/uL — AB (ref 3.4–10.8)

## 2017-12-25 LAB — LIPID PANEL WITH LDL/HDL RATIO
Cholesterol, Total: 205 mg/dL — ABNORMAL HIGH (ref 100–199)
HDL: 47 mg/dL (ref >39)
LDL CALC: 140 mg/dL — AB (ref 0–99)
LDL/HDL RATIO: 3 ratio (ref 0.0–3.2)
Triglycerides: 89 mg/dL (ref 0–149)
VLDL CHOLESTEROL CAL: 18 mg/dL (ref 5–40)

## 2017-12-25 LAB — INSULIN, RANDOM: INSULIN: 37.1 u[IU]/mL — AB (ref 2.6–24.9)

## 2017-12-25 LAB — T3: T3 TOTAL: 123 ng/dL (ref 71–180)

## 2017-12-25 LAB — VITAMIN D 25 HYDROXY (VIT D DEFICIENCY, FRACTURES): Vit D, 25-Hydroxy: 47 ng/mL (ref 30.0–100.0)

## 2017-12-25 LAB — T4, FREE: Free T4: 1.39 ng/dL (ref 0.82–1.77)

## 2017-12-25 LAB — HEMOGLOBIN A1C
Est. average glucose Bld gHb Est-mCnc: 120 mg/dL
HEMOGLOBIN A1C: 5.8 % — AB (ref 4.8–5.6)

## 2017-12-25 LAB — FOLATE: Folate: 15.5 ng/mL (ref >3.0)

## 2017-12-25 LAB — TSH: TSH: 2.27 u[IU]/mL (ref 0.450–4.500)

## 2017-12-25 LAB — VITAMIN B12: Vitamin B-12: 1865 pg/mL — ABNORMAL HIGH (ref 232–1245)

## 2017-12-25 NOTE — Progress Notes (Signed)
Office: [phone]  /  Fax: [phone]   Dear Dr. Terese Door,   Thank you for referring Belinda Garrett to our clinic. The following note includes my evaluation and treatment recommendations.  HPI:   Chief Complaint: OBESITY    Belinda Garrett has been referred by Delorise Jackson, MD for consultation regarding her obesity and obesity related comorbidities.    Belinda Garrett (MR# [id number]) is a 47 y.o. female who presents on 12/24/2017 for obesity evaluation and treatment. Current BMI is Body mass index is 85.78 kg/m.Belinda Garrett has been struggling with her weight for many years and has been unsuccessful in either losing weight, maintaining weight loss, or reaching her healthy weight goal.     Belinda Garrett has issue with portion control. She is not eating enough during the day, but she increases eating at night.     Belinda Garrett attended our information session and states she is currently in the action stage of change and ready to dedicate time achieving and maintaining a healthier weight. Belinda Garrett is interested in becoming our patient and working on intensive lifestyle modifications including (but not limited to) diet, exercise and weight loss.    Belinda Garrett states her family eats meals together she thinks her family will eat healthier with  her her desired weight loss is 279 lbs she has been heavy most of  her life she started gaining weight over time her heaviest weight ever was 480 lbs she has significant food cravings issues  she is frequently drinking liquids with calories she frequently makes poor food choices she frequently eats larger portions than normal  she struggles with emotional eating    Fatigue Belinda Garrett feels her energy is lower than it should be. This has worsened with weight gain and has not worsened recently. Belinda Garrett admits to daytime somnolence and  denies waking up still tired. Patient has a history of obstructive sleep apnea and she is wearing CPAP nightly. Patent  has a history of symptoms of daytime fatigue. Patient generally gets 7 hours of sleep per night, and states they generally have generally restful sleep. Snoring is not present. Apneic episodes are not present. Epworth Sleepiness Score is 3.  Dyspnea on exertion Avana notes increasing shortness of breath with exercising and seems to be worsening over time with weight gain. She notes getting out of breath sooner with activity than she used to. This has not gotten worse recently. Belinda Garrett denies orthopnea.  Pre-Diabetes Belinda Garrett has a diagnosis of pre-diabetes based on her elevated Hgb A1c and was informed this puts her at greater risk of developing diabetes. She is taking metformin 500 mg 1 tablet daily and denies polydipsia or polyuria. Last A1c was of 5.9. She wants to work on diet and exercise to decrease risk of diabetes. She denies nausea or hypoglycemia.  At risk for diabetes Belinda Garrett is at higher than average risk for developing diabetes due to her obesity and pre-diabetes. She currently denies polyuria or polydipsia.  Hypertension Belinda Garrett is a 47 y.o. female with hypertension. [name] is taking hydrochlorothiazide and lisinopril, and her blood pressure is well controlled. She denies chest pain, palpitations, or lightheadedness. She is working weight loss to help control her blood pressure with the goal of decreasing her risk of heart attack and stroke.   Vitamin B12 Deficiency Belinda Garrett has a diagnosis of B12 insufficiency and notes fatigue. She is on OTC vitamin B12. She is not a vegetarian and does not have a previous diagnosis of pernicious anemia. She does  not have a history of weight loss surgery.   Vitamin D Deficiency Belinda Garrett has a diagnosis of vitamin D deficiency. She is not currently taking Vit D and denies nausea, vomiting or muscle weakness.  Depression with emotional eating behaviors Belinda Garrett is struggling with emotional eating and using food for comfort to the extent that it is  negatively impacting her health. She often snacks when she is not hungry. Belinda Garrett sometimes feels she is out of control and then feels guilty that she made poor food choices. She has been working on behavior modification techniques to help reduce her emotional eating and has been somewhat successful. She shows no sign of suicidal or homicidal ideations.  Depression screen [facility] 2/9 12/24/2017 10/05/2017 09/05/2017  Decreased Interest 3 0 0  Down, Depressed, Hopeless 3 0 0  PHQ - 2 Score 6 0 0  Altered sleeping 3 - -  Tired, decreased energy 3 - -  Change in appetite 3 - -  Feeling bad or failure about yourself  3 - -  Trouble concentrating 3 - -  Moving slowly or fidgety/restless 0 - -  Suicidal thoughts 3 - -  PHQ-9 Score 24 - -    Depression Screen Belinda Garrett's Food and Mood (modified PHQ-9) score was  Depression screen PHQ 2/9 12/24/2017  Decreased Interest 3  Down, Depressed, Hopeless 3  PHQ - 2 Score 6  Altered sleeping 3  Tired, decreased energy 3  Change in appetite 3  Feeling bad or failure about yourself  3  Trouble concentrating 3  Moving slowly or fidgety/restless 0  Suicidal thoughts 3  PHQ-9 Score 24    ALLERGIES: Allergies  Allergen Reactions  . Dust Mite Extract   . Latex   . Pollen Extract     MEDICATIONS: Current Outpatient Medications on File Prior to Visit  Medication Sig Dispense Refill  . Cyanocobalamin (VITAMIN B-12 PO) Take 2,500 mcg by mouth daily.     Belinda Kitchen FLUoxetine (PROZAC) 20 MG tablet Take 2 tablets (40 mg total) by mouth daily. 180 tablet 3  . glucosamine-chondroitin 500-400 MG tablet Take 1 tablet by mouth 3 (three) times daily.    . hydrochlorothiazide (HYDRODIURIL) 25 MG tablet Take 1 tablet (25 mg total) by mouth daily. In am 90 tablet 3  . lisinopril (PRINIVIL,ZESTRIL) 30 MG tablet Take 1 tablet (30 mg total) by mouth daily. 90 tablet 1  . loratadine (CLARITIN) 10 MG tablet Take 10 mg by mouth daily.    . medroxyPROGESTERone (PROVERA) 2.5 MG  tablet Take 2.5 mg by mouth daily.     . metFORMIN (GLUCOPHAGE) 500 MG tablet daily with food    . Multiple Vitamin (MULTIVITAMIN) capsule Take 1 capsule by mouth daily.    . Naproxen Sodium (ALEVE) 220 MG CAPS Take 440 mg by mouth daily.    . Omega-3 Fatty Acids (FISH OIL) 1000 MG CAPS Take 1,000 mg by mouth daily.     No current facility-administered medications on file prior to visit.     PAST MEDICAL HISTORY: Past Medical History:  Diagnosis Date  . Abnormal menses   . Allergy   . Arthritis   . Back pain   . Chicken pox   . Depression   . Diabetes mellitus without complication ([place])   . Hypertension   . Joint pain   . Lymphedema   . Palpitations   . Prediabetes   . Seasonal allergies   . Sleep apnea   . Stasis dermatitis of both legs  PAST SURGICAL HISTORY: Past Surgical History:  Procedure Laterality Date  . WISDOM TOOTH EXTRACTION      SOCIAL HISTORY: Social History   Tobacco Use  . Smoking status: Never Smoker  . Smokeless tobacco: Never Used  Substance Use Topics  . Alcohol use: Not Currently    Alcohol/week: 0.0 standard drinks  . Drug use: Not Currently    FAMILY HISTORY: Family History  Problem Relation Age of Onset  . Arthritis Mother   . Depression Mother   . Hypertension Mother   . Thyroid disease Mother   . Obesity Mother   . Depression Father   . Parkinson's disease Father   . Dementia Father   . Obesity Father   . Arthritis Maternal Grandmother   . Heart disease Maternal Grandmother   . Hypertension Maternal Grandmother   . Hyperlipidemia Maternal Grandmother   . Stroke Maternal Grandmother   . Alcohol abuse Maternal Grandfather   . Arthritis Maternal Grandfather   . Depression Maternal Grandfather   . Hearing loss Maternal Grandfather   . Heart disease Maternal Grandfather   . Stroke Maternal Grandfather   . Arthritis Paternal Grandmother   . Arthritis Paternal Grandfather   . Depression Paternal Grandfather   . Heart  disease Paternal Grandfather   . Hypertension Paternal Grandfather   . Stroke Paternal Grandfather   . Breast cancer Neg Hx     ROS: Review of Systems  Constitutional: Positive for malaise/fatigue. Negative for weight loss.  Eyes:       + Wear glasses or contacts  Respiratory: Positive for shortness of breath (with exertion).   Cardiovascular: Negative for chest pain, palpitations and orthopnea.  Gastrointestinal: Negative for nausea and vomiting.  Genitourinary: Negative for frequency.  Musculoskeletal:       Negative muscle weakness  Neurological:       Negative lightheadedness  Endo/Heme/Allergies: Negative for polydipsia.       Negative hypoglycemia  Psychiatric/Behavioral: Positive for depression. Negative for suicidal ideas.    PHYSICAL EXAM: Pulse 81, temperature 98.1 F (36.7 C), temperature source Oral, height 5\' 1"  (1.549 m), weight (!) 454 lb (205.9 kg), last menstrual period 12/05/2017, SpO2 96 %. Body mass index is 85.78 kg/m. Physical Exam  Constitutional: She is oriented to person, place, and time. She appears well-developed and well-nourished.  HENT:  Head: Normocephalic and atraumatic.  Nose: Nose normal.  Eyes: EOM are normal. No scleral icterus.  Neck: Normal range of motion. Neck supple. No thyromegaly present.  + Mallampati= 3  Cardiovascular: Normal rate and regular rhythm.  Pulmonary/Chest: Effort normal. No respiratory distress.  Abdominal: Soft. There is no tenderness.  + Obesity  Musculoskeletal:  Range of Motion normal in all 4 extremities 1+ edema noted bilaterally, stasis changes in ankle lower leg  Neurological: She is alert and oriented to person, place, and time. Coordination normal.  Skin: Skin is warm and dry.  Psychiatric: She has a normal mood and affect. Her behavior is normal.  Vitals reviewed.   RECENT LABS AND TESTS: BMET    Component Value Date/Time   NA 140 12/24/2017 0931   NA 141 02/21/2012 0947   K 4.9 12/24/2017  0931   K 3.7 02/21/2012 0947   CL 101 12/24/2017 0931   CL 110 (H) 02/21/2012 0947   CO2 23 12/24/2017 0931   CO2 25 02/21/2012 0947   GLUCOSE 102 (H) 12/24/2017 0931   GLUCOSE 101 (H) 09/05/2017 0917   GLUCOSE 145 (H) 02/21/2012 0947   BUN  16 12/24/2017 0931   BUN 13 02/21/2012 0947   CREATININE 0.91 12/24/2017 0931   CREATININE 0.69 02/21/2012 0947   CALCIUM 9.5 12/24/2017 0931   CALCIUM 8.4 (L) 02/21/2012 0947   GFRNONAA 75 12/24/2017 0931   GFRNONAA >60 02/21/2012 0947   GFRAA 86 12/24/2017 0931   GFRAA >60 02/21/2012 0947   Lab Results  Component Value Date   HGBA1C 5.8 (H) 12/24/2017   Lab Results  Component Value Date   INSULIN 37.1 (H) 12/24/2017   CBC    Component Value Date/Time   WBC 11.6 (H) 12/24/2017 0931   WBC 12.1 (H) 09/05/2017 0917   RBC 4.93 12/24/2017 0931   RBC 4.96 09/05/2017 0917   HGB 12.1 12/24/2017 0931   HCT 38.9 12/24/2017 0931   PLT 262.0 09/05/2017 0917   PLT 222 02/21/2012 0947   MCV 79 12/24/2017 0931   MCV 79 (L) 02/21/2012 0947   MCH 24.5 (L) 12/24/2017 0931   MCH 25.1 (L) 03/19/2016 1005   MCHC 31.1 (L) 12/24/2017 0931   MCHC 33.0 09/05/2017 0917   RDW 14.6 12/24/2017 0931   RDW 16.0 (H) 02/21/2012 0947   LYMPHSABS 2.5 12/24/2017 0931   MONOABS 0.6 09/05/2017 0917   EOSABS 0.3 12/24/2017 0931   BASOSABS 0.1 12/24/2017 0931   Iron/TIBC/Ferritin/ %Sat No results found for: IRON, TIBC, FERRITIN, IRONPCTSAT Lipid Panel     Component Value Date/Time   CHOL 205 (H) 12/24/2017 0931   TRIG 89 12/24/2017 0931   HDL 47 12/24/2017 0931   CHOLHDL 4 09/05/2017 0917   VLDL 19.0 09/05/2017 0917   LDLCALC 140 (H) 12/24/2017 0931   Hepatic Function Panel     Component Value Date/Time   PROT 6.5 12/24/2017 0931   PROT 6.5 02/21/2012 0947   ALBUMIN 4.3 12/24/2017 0931   ALBUMIN 3.2 (L) 02/21/2012 0947   AST 19 12/24/2017 0931   AST 22 02/21/2012 0947   ALT 24 12/24/2017 0931   ALT 30 02/21/2012 0947   ALKPHOS 69 12/24/2017  0931   ALKPHOS 89 02/21/2012 0947   BILITOT 0.5 12/24/2017 0931   BILITOT 1.1 (H) 02/21/2012 0947      Component Value Date/Time   TSH 2.270 12/24/2017 0931   TSH 1.91 09/05/2017 0917    ECG  shows NSR with a rate of 90 BPM INDIRECT CALORIMETER done today shows a VO2 of 510 and a REE of 3553.  Her calculated basal metabolic rate was unable to be obtained thus her basal metabolic rate is unknown.    ASSESSMENT AND PLAN: Other fatigue - Plan: EKG 12-Lead, CBC With Differential, T3, T4, free, TSH, Vitamin B12, Folate  Shortness of breath on exertion  Prediabetes - Plan: Hemoglobin A1c, Insulin, random, Lipid Panel With LDL/HDL Ratio  Essential hypertension - Plan: CBC With Differential, Comprehensive metabolic panel, Lipid Panel With LDL/HDL Ratio  B12 deficiency - Plan: Vitamin B12, Folate  Vitamin D deficiency - Plan: VITAMIN D 25 Hydroxy (Vit-D Deficiency, Fractures)  Other depression - with emotional eating   Depression screening  At risk for diabetes mellitus  Class 3 severe obesity with serious comorbidity and body mass index (BMI) greater than or equal to 70 in adult, unspecified obesity type (HCC)  PLAN:  Fatigue Romi was informed that her fatigue may be related to obesity, depression or many other causes. Labs will be ordered, and in the meanwhile Juliene has agreed to continue to wear CPAP, work on diet, exercise and weight loss to help with fatigue.  Proper sleep hygiene was discussed including the need for 7-8 hours of quality sleep each night. A sleep study was not ordered based on symptoms and Epworth score.  Dyspnea on exertion Thomasene's shortness of breath appears to be obesity related and exercise induced. She has agreed to work on weight loss and gradually increase exercise to treat her exercise induced shortness of breath. If Kaylenn follows our instructions and loses weight without improvement of her shortness of breath, we will plan to refer to pulmonology.  We will monitor this condition regularly. Terita agrees to this plan.  Pre-Diabetes Christiann will continue to work on weight loss, exercise, and decreasing simple carbohydrates in her diet to help decrease the risk of diabetes. We dicussed metformin including benefits and risks. She was informed that eating too many simple carbohydrates or too many calories at one sitting increases the likelihood of GI side effects. Sheera agrees to continue taking metformin, we will check labs today, and she agrees to follow up with our clinic in 2 weeks as directed to monitor her progress.  Diabetes risk counselling Juanitta was given extended (15 minutes) diabetes prevention counseling today. She is 47 y.o. female and has risk factors for diabetes including obesity and pre-diabetes. We discussed intensive lifestyle modifications today with an emphasis on weight loss as well as increasing exercise and decreasing simple carbohydrates in her diet.  Hypertension We discussed sodium restriction, working on healthy weight loss, and a regular exercise program as the means to achieve improved blood pressure control. Wealthy agreed with this plan and agreed to follow up as directed. We will continue to monitor her blood pressure as well as her progress with the above lifestyle modifications. Deaysia agrees to continue her medications as prescribed and will watch for signs of hypotension as she continues her lifestyle modifications. We will check labs today and Aamari agrees to follow up with our clinic in 2 weeks.  Vitamin B12 Deficiency Lachlyn will work on increasing B12 rich foods in her diet. B12 supplementation was not prescribed today. We will check labs today and Anber agrees to follow up with our clinic in 2 weeks.  Vitamin D Deficiency Sheyann was informed that low vitamin D levels contributes to fatigue and are associated with obesity, breast, and colon cancer. She will follow up for routine testing of vitamin D, at  least 2-3 times per year. She was informed of the risk of over-replacement of vitamin D and agrees to not increase her dose unless she discusses this with [name] first. We will check labs today and Laurelle agrees to follow up with our clinic in 2 weeks.  Depression with Emotional Eating Behaviors We discussed behavior modification techniques and cognitive behavioral strategies today to help Bret deal with her emotional eating and depression. We will refer to Dr. Mallie Mussel, our bariatric psychologist in the future. Mayumi agrees to follow up with our clinic in 2 weeks.  Depression Screen [place] had a strongly positive depression screening. Depression is commonly associated with obesity and often results in emotional eating behaviors. We will monitor this closely and work on CBT to help improve the non-hunger eating patterns. Referral to Psychology may be required if no improvement is seen as she continues in our clinic.  Obesity Amila is currently in the action stage of change and her goal is to continue with weight loss efforts. I recommend Aashi begin the structured treatment plan as follows:  She has agreed to follow the Category 4 plan Carter has been instructed to  eventually work up to a goal of 150 minutes of combined cardio and strengthening exercise per week for weight loss and overall health benefits. We discussed the following Behavioral Modification Strategies today: increasing lean protein intake, decreasing simple carbohydrates, increasing vegetables, work on meal planning and easy cooking plans, emotional eating strategies, ways to avoid night time snacking, increase H20 intake, no skipping meals, keeping healthy foods in the home, and better snacking choices   She was informed of the importance of frequent follow up visits to maximize her success with intensive lifestyle modifications for her multiple health conditions. She was informed we would discuss her lab results at her next visit  unless there is a critical issue that needs to be addressed sooner. Charlet agreed to keep her next visit at the agreed upon time to discuss these results.    OBESITY BEHAVIORAL INTERVENTION VISIT  Today's visit was # 1   Starting weight: 454 lbs Starting date: 12/24/17 Today's weight : 454 lbs  Today's date: 12/24/2017 Total lbs lost to date: 0 At least 15 minutes were spent on discussing the following behavioral intervention visit.   ASK: We discussed the diagnosis of obesity with [name] today and Aurore agreed to give [name] permission to discuss obesity behavioral modification therapy today.  ASSESS: Melina has the diagnosis of obesity and her BMI today is 4.83 Leianna is in the action stage of change   ADVISE: Giovannina was educated on the multiple health risks of obesity as well as the benefit of weight loss to improve her health. She was advised of the need for long term treatment and the importance of lifestyle modifications to improve her current health and to decrease her risk of future health problems.  AGREE: Multiple dietary modification options and treatment options were discussed and  Yanelis agreed to follow the recommendations documented in the above note.  ARRANGE: Montzerrat was educated on the importance of frequent visits to treat obesity as outlined per CMS and USPSTF guidelines and agreed to schedule her next follow up appointment today.  Wilhemena Durie, am acting as transcriptionist for [employer], DO  I have reviewed the above documentation for accuracy and completeness, and I agree with the above. -Jearld Lesch, DO

## 2018-01-01 ENCOUNTER — Encounter (INDEPENDENT_AMBULATORY_CARE_PROVIDER_SITE_OTHER): Payer: Self-pay | Admitting: Bariatrics

## 2018-01-07 ENCOUNTER — Ambulatory Visit (INDEPENDENT_AMBULATORY_CARE_PROVIDER_SITE_OTHER): Payer: 59 | Admitting: Bariatrics

## 2018-01-07 ENCOUNTER — Encounter: Payer: Self-pay | Admitting: Internal Medicine

## 2018-01-07 ENCOUNTER — Encounter (INDEPENDENT_AMBULATORY_CARE_PROVIDER_SITE_OTHER): Payer: Self-pay | Admitting: Bariatrics

## 2018-01-07 VITALS — BP 106/66 | HR 80 | Temp 98.6°F | Ht 61.0 in | Wt >= 6400 oz

## 2018-01-07 DIAGNOSIS — Z6841 Body Mass Index (BMI) 40.0 and over, adult: Secondary | ICD-10-CM

## 2018-01-07 DIAGNOSIS — I10 Essential (primary) hypertension: Secondary | ICD-10-CM

## 2018-01-07 DIAGNOSIS — E559 Vitamin D deficiency, unspecified: Secondary | ICD-10-CM

## 2018-01-07 DIAGNOSIS — R7303 Prediabetes: Secondary | ICD-10-CM | POA: Diagnosis not present

## 2018-01-08 ENCOUNTER — Encounter: Payer: Self-pay | Admitting: Internal Medicine

## 2018-01-08 ENCOUNTER — Ambulatory Visit (INDEPENDENT_AMBULATORY_CARE_PROVIDER_SITE_OTHER): Payer: 59 | Admitting: Internal Medicine

## 2018-01-08 VITALS — BP 128/76 | HR 94 | Temp 98.7°F | Ht 61.0 in | Wt >= 6400 oz

## 2018-01-08 DIAGNOSIS — I872 Venous insufficiency (chronic) (peripheral): Secondary | ICD-10-CM | POA: Diagnosis not present

## 2018-01-08 DIAGNOSIS — M545 Low back pain: Secondary | ICD-10-CM | POA: Diagnosis not present

## 2018-01-08 DIAGNOSIS — D72829 Elevated white blood cell count, unspecified: Secondary | ICD-10-CM | POA: Diagnosis not present

## 2018-01-08 DIAGNOSIS — L719 Rosacea, unspecified: Secondary | ICD-10-CM | POA: Diagnosis not present

## 2018-01-08 DIAGNOSIS — R200 Anesthesia of skin: Secondary | ICD-10-CM | POA: Diagnosis not present

## 2018-01-08 DIAGNOSIS — G8929 Other chronic pain: Secondary | ICD-10-CM | POA: Diagnosis not present

## 2018-01-08 DIAGNOSIS — Z23 Encounter for immunization: Secondary | ICD-10-CM | POA: Diagnosis not present

## 2018-01-08 NOTE — Patient Instructions (Addendum)
Metrogel 0.75% 2x per day 1% 1x per day  Creams with with green tint to calm down red    Stasis Dermatitis Stasis dermatitis is a long-term (chronic) skin condition that happens when veins can no longer pump blood back to the heart (poor circulation). This condition causes a red or brown scaly rash or sores (ulcers) from the pooling of blood (stasis). This condition usually affects the lower legs. It may affect one leg or both legs. Without treatment, severe stasis dermatitis can lead to other skin conditions and infections. What are the causes? This condition is caused by poor circulation. What increases the risk? This condition is more likely to develop in people who:  Are not very active.  Stand for long periods of time.  Have veins that have become enlarged and twisted (varicose veins).  Have leg veins that are not strong enough to send blood back to the heart (venous insufficiency).  Have had a blood clot.  Have been pregnant many times.  Have had vein surgery.  Are obese.  Have heart or kidney failure.  Are 30 years of age or older.  What are the signs or symptoms? Common early symptoms of this condition include:  Swelling in your ankle or leg. This might get better overnight but be worse again in the day.  Skin that looks thin on your ankle and leg.  Owens Shark marks that develop slowly.  Skin that is easily irritated or cracked.  Red, swollen skin.  An achy or heavy feeling after you walk or stand for long periods of time.  Pain.  Later and more severe symptoms of this condition include:  Skin that looks shiny.  Small, open sores (ulcers). These are often red or purple.  Dry, cracking skin.  Skin that feels hard.  Severe itching.  A change in the shape or color of your lower legs.  Severe pain.  Difficulty walking.  How is this diagnosed? Your health care provider may suspect this condition from your symptoms and medical history. Your health  care provider will also do a physical exam. You may need to see a health care provider who specializes in skin diseases (dermatologist). You may also have tests to confirm the diagnosis, including:  Blood tests.  Imaging studies to check blood flow (Doppler ultrasound).  Allergy tests.  How is this treated? Treatment for this condition may include medicine, such as:  Corticosteroid creams and ointments.  Non-corticosteroid medicines applied to the skin (topical).  Medicine to reduce swelling in the legs (diuretics).  Antibiotics.  Medicine to relieve itching (antihistamines).  You may also have to wear:  Compression stockings or an elastic wrap to improve circulation.  A bandage (dressing).  A wrap that contains zinc and gelatin (Unna boot).  Follow these instructions at home: [place] your skin as told by your health care provider. Do not use moisturizers with fragrance. This can irritate your skin.  Apply cool compresses to the affected areas.  Do not scratch your skin.  Do not rub your skin dry after a bath or shower. Gently pat your skin dry.  Do not use scented soaps, detergents, or perfumes. Medicines  Take or use over-the-counter and prescription medicines only as told by your health care provider.  If you were prescribed an antibiotic medicine, take or use it as told by your health care provider. Do not stop taking or using the antibiotic even if your condition starts to improve. Lifestyle  Do not stand or  sit in one position for long periods of time.  Do not cross your legs when you sit.  Raise (elevate) your legs above the level of your heart when you are sitting or lying down.  Walk as told by your health care provider. Walking increases blood flow.  Wear comfortable, loose-fitting clothing. Circulation in your legs will be worse if you wear tight pants, belts, and waistbands. General instructions  Change and remove any dressing as  told by your health care provider, if this applies.  Wear compression stockings as told by your health care provider, if this applies. These stockings help to prevent blood clots and reduce swelling in your legs.  Wear the [employer] as told by your health care provider, if this applies.  Keep all follow-up visits as told by your health care provider. This is important. Contact a health care provider if:  Your condition does not improve with treatment.  Your condition gets worse.  You have signs of infection in the affected area. Watch for: ? Swelling. ? Tenderness. ? Redness. ? Soreness. ? Warmth.  You have a fever. Get help right away if:  You notice red streaks coming from the affected area.  Your bone or joint underneath the affected area becomes painful after the skin has healed.  The affected area turns darker.  You feel a deep pain in your leg or groin.  You are short of breath. This information is not intended to replace advice given to you by your health care provider. Make sure you discuss any questions you have with your health care provider. Document Released: 06/15/2005 Document Revised: 11/02/2015 Document Reviewed: 07/22/2014 Elsevier Interactive Patient Education  2018 [employer].  Rosacea Rosacea is a long-term (chronic) condition that affects the skin of the face, including the cheeks, nose, brow, and chin. This condition can also affect the eyes. Rosacea causes blood vessels near the surface of the skin to enlarge, which results in redness. What are the causes? The cause of this condition is not known. Certain triggers can make rosacea worse, including:  Hot baths.  Exercise.  Sunlight.  Very hot or cold temperatures.  Hot or spicy foods and drinks.  Drinking alcohol.  Stress.  Taking blood pressure medicine.  Long-term use of topical steroids on the face.  What increases the risk? This condition is more likely to develop in:  People  who are older than 49 years of age.  Women.  People who have light-colored skin (light complexion).  People who have a family history of rosacea.  What are the signs or symptoms? Symptoms of this condition include:  Redness of the face.  Red bumps or pimples on the face.  A red, enlarged nose.  Blushing easily.  Red lines on the skin.  Irritated or burning feeling in the eyes.  Swollen eyelids.  How is this diagnosed? This condition is diagnosed with a medical history and physical exam. How is this treated? There is no cure for this condition, but treatment can help to control your symptoms. Your health care provider may recommend that you see a skin specialist (dermatologist). Treatment may include:  Antibiotic medicines that are applied to the skin or taken as a pill.  Laser treatment to improve the appearance of the skin.  Surgery. This is rare.  Your health care provider will also recommend the best way to take care of your skin. Even after your skin improves, you will likely need to continue treatment to prevent your rosacea  from coming back. Follow these instructions at home: Skin Care Take care of your skin as told by your health care provider. You may be told to do these things:  Wash your skin gently two or more times each day.  Use mild soap.  Use a sunscreen or sunblock with SPF 30 or greater.  Use gentle cosmetics that are meant for sensitive skin.  Shave with an electric shaver instead of a blade.  Lifestyle  Try to keep track of what foods trigger this condition. Avoid any triggers. These may include: ? Spicy foods. ? Seafood. ? Cheese. ? Hot liquids. ? Nuts. ? Chocolate. ? Iodized salt.  Do not drink alcohol.  Avoid extremely cold or hot temperatures.  Try to reduce your stress. If you need help, talk with your health care provider.  When you exercise, do these things to stay cool: ? Limit your sun exposure. ? Use a fan. ? Do  shorter and more frequent intervals of exercise. General instructions  Keep all follow-up visits as told by your health care provider. This is important.  Take over-the-counter and prescription medicines only as told by your health care provider.  If your eyelids are affected, apply warm compresses to them. Do this as told by your health care provider.  If you were prescribed an antibiotic medicine, apply or take it as told by your health care provider. Do not stop using the antibiotic even if your condition improves. Contact a health care provider if:  Your symptoms get worse.  Your symptoms do not improve after two months of treatment.  You have new symptoms.  You have any changes in vision or you have problems with your eyes, such as redness or itching.  You feel depressed.  You lose your appetite.  You have trouble concentrating. This information is not intended to replace advice given to you by your health care provider. Make sure you discuss any questions you have with your health care provider. Document Released: 04/13/2004 Document Revised: 08/12/2015 Document Reviewed: 05/13/2014 Elsevier Interactive Patient Education  [employer].  Results for Belinda Garrett, Belinda Garrett" (MRN [id number]) as of 01/08/2018 09:23  Ref. Range 09/05/2017 09:22  Rubella Latest Units: index 2.30  Hepatitis B-Post Latest Ref Range: > OR = 10 mIU/mL <5 (L)  Mumps IgG Latest Units: AU/mL 233.00  Rubeola IgG Latest Units: AU/mL <25.00 (L)   Call health dept [phone] or pharmacy  [facility] low back when ready after 03/2018    Paresthesia Paresthesia is an abnormal burning or prickling sensation. This sensation is generally felt in the hands, arms, legs, or feet. However, it may occur in any part of the body. Usually, it is not painful. The feeling may be described as:  Tingling or numbness.  Pins and needles.  Skin crawling.  Buzzing.  Limbs falling  asleep.  Itching.  Most people experience temporary (transient) paresthesia at some time in their lives. Paresthesia may occur when you breathe too quickly (hyperventilation). It can also occur without any apparent cause. Commonly, paresthesia occurs when pressure is placed on a nerve. The sensation quickly goes away after the pressure is removed. For some people, however, paresthesia is a long-lasting (chronic) condition that is caused by an underlying disorder. If you continue to have paresthesia, you may need further medical evaluation. Follow these instructions at home: Watch your condition for any changes. Taking the following actions may help to lessen any discomfort that you are feeling:  Avoid drinking alcohol.  Try acupuncture or massage to help relieve your symptoms.  Keep all follow-up visits as directed by your health care provider. This is important.  Contact a health care provider if:  You continue to have episodes of paresthesia.  Your burning or prickling feeling gets worse when you walk.  You have pain, cramps, or dizziness.  You develop a rash. Get help right away if:  You feel weak.  You have trouble walking or moving.  You have problems with speech, understanding, or vision.  You feel confused.  You cannot control your bladder or bowel movements.  You have numbness after an injury.  You faint. This information is not intended to replace advice given to you by your health care provider. Make sure you discuss any questions you have with your health care provider. Document Released: 02/24/2002 Document Revised: 08/12/2015 Document Reviewed: 03/02/2014 Elsevier Interactive Patient Education  [employer].

## 2018-01-08 NOTE — Progress Notes (Signed)
Chief Complaint  Patient presents with  . Follow-up   F/u  1. Obesity lost 35 lbs overall doing well seeing wt loss clinic q2 weeks and enjoys this  2. Intermittent low back pain mild and numbness in b/l toes has not seen neurology Dr. Melrose Nakayama yet  3. Leukocytosis since 2017 consider h/o will wait for now  4. Rosacea upcoming appt with dermatology finacea was expensive and stasis dermatitis to lower legs b/l not changing   Review of Systems  Constitutional: Negative for weight loss.  HENT: Negative for hearing loss.   Eyes: Negative for blurred vision.  Cardiovascular: Negative for chest pain.  Gastrointestinal: Negative for abdominal pain.  Skin: Negative for rash.       +chronic leg rash   Neurological: Negative for headaches.  Psychiatric/Behavioral: Negative for depression.   Past Medical History:  Diagnosis Date  . Abnormal menses   . Allergy   . Arthritis   . Back pain   . Chicken pox   . Depression   . Diabetes mellitus without complication ([place])   . Hypertension   . Joint pain   . Lymphedema   . Palpitations   . Prediabetes   . Seasonal allergies   . Sleep apnea   . Stasis dermatitis of both legs    Past Surgical History:  Procedure Laterality Date  . WISDOM TOOTH EXTRACTION     Family History  Problem Relation Age of Onset  . Arthritis Mother   . Depression Mother   . Hypertension Mother   . Thyroid disease Mother   . Obesity Mother   . Depression Father   . Parkinson's disease Father   . Dementia Father   . Obesity Father   . Arthritis Maternal Grandmother   . Heart disease Maternal Grandmother   . Hypertension Maternal Grandmother   . Hyperlipidemia Maternal Grandmother   . Stroke Maternal Grandmother   . Alcohol abuse Maternal Grandfather   . Arthritis Maternal Grandfather   . Depression Maternal Grandfather   . Hearing loss Maternal Grandfather   . Heart disease Maternal Grandfather   . Stroke Maternal Grandfather   . Arthritis Paternal  Grandmother   . Arthritis Paternal Grandfather   . Depression Paternal Grandfather   . Heart disease Paternal Grandfather   . Hypertension Paternal Grandfather   . Stroke Paternal Grandfather   . Breast cancer Neg Hx    Social History   Socioeconomic History  . Marital status: Single    Spouse name: Not on file  . Number of children: Not on file  . Years of education: Not on file  . Highest education level: Not on file  Occupational History  . Occupation: work from home, Designer, jewellery - [place]  Social Needs  . Financial resource strain: Not on file  . Food insecurity:    Worry: Not on file    Inability: Not on file  . Transportation needs:    Medical: Not on file    Non-medical: Not on file  Tobacco Use  . Smoking status: Never Smoker  . Smokeless tobacco: Never Used  Substance and Sexual Activity  . Alcohol use: Not Currently    Alcohol/week: 0.0 standard drinks  . Drug use: Not Currently  . Sexual activity: Never  Lifestyle  . Physical activity:    Days per week: Not on file    Minutes per session: Not on file  . Stress: Not on file  Relationships  . Social connections:  Talks on phone: Not on file    Gets together: Not on file    Attends religious service: Not on file    Active member of club or organization: Not on file    Attends meetings of clubs or organizations: Not on file    Relationship status: Not on file  . Intimate partner violence:    Fear of current or ex partner: Not on file    Emotionally abused: Not on file    Physically abused: Not on file    Forced sexual activity: Not on file  Other Topics Concern  . Not on file  Social History Narrative   Works for [employer]    No kids    No guns    Wears seat belt    Safe in relationship    Current Meds  Medication Sig  . Cyanocobalamin (VITAMIN B-12 PO) Take 2,500 mcg by mouth daily.   [name] FLUoxetine (PROZAC) 20 MG tablet Take 2 tablets (40 mg total) by mouth daily.  [name]  glucosamine-chondroitin 500-400 MG tablet Take 1 tablet by mouth 3 (three) times daily.  . hydrochlorothiazide (HYDRODIURIL) 25 MG tablet Take 1 tablet (25 mg total) by mouth daily. In am  . lisinopril (PRINIVIL,ZESTRIL) 30 MG tablet Take 1 tablet (30 mg total) by mouth daily.  [name] loratadine (CLARITIN) 10 MG tablet Take 10 mg by mouth daily.  . medroxyPROGESTERone (PROVERA) 2.5 MG tablet Take 2.5 mg by mouth daily.   . metFORMIN (GLUCOPHAGE) 500 MG tablet daily with food  . Multiple Vitamin (MULTIVITAMIN) capsule Take 1 capsule by mouth daily.  . Naproxen Sodium (ALEVE) 220 MG CAPS Take 440 mg by mouth daily.  . Omega-3 Fatty Acids (FISH OIL) 1000 MG CAPS Take 1,000 mg by mouth daily.   Allergies  Allergen Reactions  . Dust Mite Extract   . Latex   . Pollen Extract    Recent Results (from the past 2160 hour(s))  CBC With Differential     Status: Abnormal   Collection Time: 12/24/17  9:31 AM  Result Value Ref Range   WBC 11.6 (H) 3.4 - 10.8 x10E3/uL   RBC 4.93 3.77 - 5.28 x10E6/uL   Hemoglobin 12.1 11.1 - 15.9 g/dL   Hematocrit 38.9 34.0 - 46.6 %   MCV 79 79 - 97 fL   MCH 24.5 (L) 26.6 - 33.0 pg   MCHC 31.1 (L) 31.5 - 35.7 g/dL   RDW 14.6 12.3 - 15.4 %   Neutrophils 71 Not Estab. %   Lymphs 22 Not Estab. %   Monocytes 5 Not Estab. %   Eos 2 Not Estab. %   Basos 0 Not Estab. %   Neutrophils Absolute 8.2 (H) 1.4 - 7.0 x10E3/uL   Lymphocytes Absolute 2.5 0.7 - 3.1 x10E3/uL   Monocytes Absolute 0.5 0.1 - 0.9 x10E3/uL   EOS (ABSOLUTE) 0.3 0.0 - 0.4 x10E3/uL   Basophils Absolute 0.1 0.0 - 0.2 x10E3/uL   Immature Granulocytes 0 Not Estab. %   Immature Grans (Abs) 0.0 0.0 - 0.1 x10E3/uL  Comprehensive metabolic panel     Status: Abnormal   Collection Time: 12/24/17  9:31 AM  Result Value Ref Range   Glucose 102 (H) 65 - 99 mg/dL   BUN 16 6 - 24 mg/dL   Creatinine, Ser 0.91 0.57 - 1.00 mg/dL   GFR calc non Af Amer 75 >59 mL/min/1.73   GFR calc Af Amer 86 >59 mL/min/1.73    BUN/Creatinine Ratio 18 9 - 23  Sodium 140 134 - 144 mmol/L   Potassium 4.9 3.5 - 5.2 mmol/L   Chloride 101 96 - 106 mmol/L   CO2 23 20 - 29 mmol/L   Calcium 9.5 8.7 - 10.2 mg/dL   Total Protein 6.5 6.0 - 8.5 g/dL   Albumin 4.3 3.5 - 5.5 g/dL   Globulin, Total 2.2 1.5 - 4.5 g/dL   Albumin/Globulin Ratio 2.0 1.2 - 2.2   Bilirubin Total 0.5 0.0 - 1.2 mg/dL   Alkaline Phosphatase 69 39 - 117 IU/L   AST 19 0 - 40 IU/L   ALT 24 0 - 32 IU/L  Hemoglobin A1c     Status: Abnormal   Collection Time: 12/24/17  9:31 AM  Result Value Ref Range   Hgb A1c MFr Bld 5.8 (H) 4.8 - 5.6 %    Comment:          Prediabetes: 5.7 - 6.4          Diabetes: >6.4          Glycemic control for adults with diabetes: <7.0    Est. average glucose Bld gHb Est-mCnc 120 mg/dL  Insulin, random     Status: Abnormal   Collection Time: 12/24/17  9:31 AM  Result Value Ref Range   INSULIN 37.1 (H) 2.6 - 24.9 uIU/mL  Lipid Panel With LDL/HDL Ratio     Status: Abnormal   Collection Time: 12/24/17  9:31 AM  Result Value Ref Range   Cholesterol, Total 205 (H) 100 - 199 mg/dL   Triglycerides 89 0 - 149 mg/dL   HDL 47 >39 mg/dL   VLDL Cholesterol Cal 18 5 - 40 mg/dL   LDL Calculated 140 (H) 0 - 99 mg/dL   LDl/HDL Ratio 3.0 0.0 - 3.2 ratio    Comment:                                     LDL/HDL Ratio                                             Men  Women                               1/2 Avg.Risk  1.0    1.5                                   Avg.Risk  3.6    3.2                                2X Avg.Risk  6.2    5.0                                3X Avg.Risk  8.0    6.1   T3     Status: None   Collection Time: 12/24/17  9:31 AM  Result Value Ref Range   T3, Total 123 71 - 180 ng/dL  T4, free     Status: None   Collection Time: 12/24/17  9:31 AM  Result  Value Ref Range   Free T4 1.39 0.82 - 1.77 ng/dL  TSH     Status: None   Collection Time: 12/24/17  9:31 AM  Result Value Ref Range   TSH 2.270 0.450 - 4.500  uIU/mL  VITAMIN D 25 Hydroxy (Vit-D Deficiency, Fractures)     Status: None   Collection Time: 12/24/17  9:31 AM  Result Value Ref Range   Vit D, 25-Hydroxy 47.0 30.0 - 100.0 ng/mL    Comment: Vitamin D deficiency has been defined by the [place] practice guideline as a level of serum 25-OH vitamin D less than 20 ng/mL (1,2). The Endocrine Society went on to further define vitamin D insufficiency as a level between 21 and 29 ng/mL (2). 1. IOM (Institute of Medicine). 2010. Dietary reference    intakes for calcium and D. [place]: The    [employer]. 2. Holick MF, Binkley [place], Bischoff-Ferrari HA, et al.    Evaluation, treatment, and prevention of vitamin D    deficiency: an Endocrine Society clinical practice    guideline. JCEM. 2011 Jul; 96(7):1911-30.   Vitamin B12     Status: Abnormal   Collection Time: 12/24/17  9:31 AM  Result Value Ref Range   Vitamin B-12 1,865 (H) 232 - 1,245 pg/mL  Folate     Status: None   Collection Time: 12/24/17  9:31 AM  Result Value Ref Range   Folate 15.5 >3.0 ng/mL    Comment: A serum folate concentration of less than 3.1 ng/mL is considered to represent clinical deficiency.    Objective  Body mass index is 84.2 kg/m. Wt Readings from Last 3 Encounters:  01/08/18 (!) 445 lb 9.6 oz (202.1 kg)  01/07/18 (!) 442 lb (200.5 kg)  12/24/17 (!) 454 lb (205.9 kg)   Temp Readings from Last 3 Encounters:  01/08/18 98.7 F (37.1 C) (Oral)  01/07/18 98.6 F (37 C) (Oral)  12/24/17 98.1 F (36.7 C) (Oral)   BP Readings from Last 3 Encounters:  01/08/18 128/76  01/07/18 106/66  12/25/17 108/71   Pulse Readings from Last 3 Encounters:  01/08/18 94  01/07/18 80  12/24/17 81    Physical Exam  Constitutional: She is oriented to person, place, and time. Vital signs are normal. She appears well-developed and well-nourished. She is cooperative.  HENT:  Head: Normocephalic and atraumatic.    Nose: Nose normal.  Mouth/Throat: Oropharynx is clear and moist and mucous membranes are normal.  Eyes: Pupils are equal, round, and reactive to light. Conjunctivae are normal.  Cardiovascular: Normal rate, regular rhythm and normal heart sounds.  Pulmonary/Chest: Effort normal and breath sounds normal.  Abdominal:    Neurological: She is alert and oriented to person, place, and time. Gait normal.  Skin: Skin is warm and dry.  Stasis derm b/l legs   Psychiatric: She has a normal mood and affect. Her speech is normal and behavior is normal. Judgment and thought content normal. Cognition and memory are normal.  Nursing note and vitals reviewed.   Assessment   1. Obesity BMI 84.2  2. Intermittent low back pain ?lumbar radiculopathy with numbness b/l otes  3. Leukocytosis  4 rosacea and stasis dermatitis  5. HM Plan   1. F/u wt loss clinic q 2 weeks  Declined adipex for now  Changing diet and will start exercise  2. Xray in 2020  3. Consider H/o if continues  4. F/u dermatology 01/2018 disc metrogel  5.  Had flu shot today  UTD Tdap  rec MMR and hep B vaccine pt to think about Declines STD testing  Pap 02/02/16 neg neg HPV 09/14/17 mammogramneg  Derm is Dr. Cari Caraway see 01/2018   Pending appt Dr. Melrose Nakayama next month  Consider H/O in future   Provider: Dr. Olivia Mackie McLean-Scocuzza-[facility]

## 2018-01-08 NOTE — Progress Notes (Signed)
Pre visit review using our clinic review tool, if applicable. No additional management support is needed unless otherwise documented below in the visit note. 

## 2018-01-09 DIAGNOSIS — R7303 Prediabetes: Secondary | ICD-10-CM | POA: Insufficient documentation

## 2018-01-09 DIAGNOSIS — Z6841 Body Mass Index (BMI) 40.0 and over, adult: Secondary | ICD-10-CM

## 2018-01-09 NOTE — Progress Notes (Signed)
Office: [phone]  /  Fax: [phone]   HPI:   Chief Complaint: OBESITY Belinda Garrett is here to discuss her progress with her obesity treatment plan. She is on the Category 4 eating plan and is following her eating plan approximately 80 % of the time. She states she is exercising 0 minutes 0 times per week. [name] is working hard and did not struggle. She reports her hunger is controlled and is having occasional cravings.  Her weight is (!) 442 lb (200.5 kg) today and has had a weight loss of 12 pounds over a period of 2 weeks since her last visit. She has lost 12 lbs since starting treatment with [name].  Pre-Diabetes Belinda Garrett has a diagnosis of prediabetes based on her elevated HgA1c of 5.8 and A1c of 37.1 and was informed this puts her at greater risk of developing diabetes. She is taking metformin currently and continues to work on diet and exercise to decrease risk of diabetes. She denies nausea or hypoglycemia.  Hypertension Belinda Garrett is a 47 y.o. female with hypertension.  [name] denies chest pain, lightheadedness, or shortness of breath on exertion. She is working weight loss to help control her blood pressure with the goal of decreasing her risk of heart attack and stroke. [name] blood pressure is currently controlled. She is currently taking HCTZ and Lisinopril.   Vitamin D deficiency Belinda Garrett has a diagnosis of vitamin D deficiency. She is currently taking OTC multivitamin with vit D and her level is well controlled. She denies nausea, vomiting or muscle weakness.   Ref. Range 12/24/2017 09:31  Vitamin D, 25-Hydroxy Latest Ref Range: 30.0 - 100.0 ng/mL 47.0     ALLERGIES: Allergies  Allergen Reactions  . Dust Mite Extract   . Latex   . Pollen Extract     MEDICATIONS: Current Outpatient Medications on File Prior to Visit  Medication Sig Dispense Refill  . Cyanocobalamin (VITAMIN B-12 PO) Take 2,500 mcg by mouth daily.     [name] FLUoxetine (PROZAC) 20 MG tablet Take  2 tablets (40 mg total) by mouth daily. 180 tablet 3  . glucosamine-chondroitin 500-400 MG tablet Take 1 tablet by mouth 3 (three) times daily.    . hydrochlorothiazide (HYDRODIURIL) 25 MG tablet Take 1 tablet (25 mg total) by mouth daily. In am 90 tablet 3  . lisinopril (PRINIVIL,ZESTRIL) 30 MG tablet Take 1 tablet (30 mg total) by mouth daily. 90 tablet 1  . loratadine (CLARITIN) 10 MG tablet Take 10 mg by mouth daily.    . medroxyPROGESTERone (PROVERA) 2.5 MG tablet Take 2.5 mg by mouth daily.     . metFORMIN (GLUCOPHAGE) 500 MG tablet daily with food    . Multiple Vitamin (MULTIVITAMIN) capsule Take 1 capsule by mouth daily.    . Naproxen Sodium (ALEVE) 220 MG CAPS Take 440 mg by mouth daily.    . Omega-3 Fatty Acids (FISH OIL) 1000 MG CAPS Take 1,000 mg by mouth daily.     No current facility-administered medications on file prior to visit.     PAST MEDICAL HISTORY: Past Medical History:  Diagnosis Date  . Abnormal menses   . Allergy   . Arthritis   . Back pain   . Chicken pox   . Depression   . Diabetes mellitus without complication ([place])   . Hypertension   . Joint pain   . Lymphedema   . Palpitations   . Prediabetes   . Seasonal allergies   . Sleep apnea   .  Stasis dermatitis of both legs     PAST SURGICAL HISTORY: Past Surgical History:  Procedure Laterality Date  . WISDOM TOOTH EXTRACTION      SOCIAL HISTORY: Social History   Tobacco Use  . Smoking status: Never Smoker  . Smokeless tobacco: Never Used  Substance Use Topics  . Alcohol use: Not Currently    Alcohol/week: 0.0 standard drinks  . Drug use: Not Currently    FAMILY HISTORY: Family History  Problem Relation Age of Onset  . Arthritis Mother   . Depression Mother   . Hypertension Mother   . Thyroid disease Mother   . Obesity Mother   . Depression Father   . Parkinson's disease Father   . Dementia Father   . Obesity Father   . Arthritis Maternal Grandmother   . Heart disease Maternal  Grandmother   . Hypertension Maternal Grandmother   . Hyperlipidemia Maternal Grandmother   . Stroke Maternal Grandmother   . Alcohol abuse Maternal Grandfather   . Arthritis Maternal Grandfather   . Depression Maternal Grandfather   . Hearing loss Maternal Grandfather   . Heart disease Maternal Grandfather   . Stroke Maternal Grandfather   . Arthritis Paternal Grandmother   . Arthritis Paternal Grandfather   . Depression Paternal Grandfather   . Heart disease Paternal Grandfather   . Hypertension Paternal Grandfather   . Stroke Paternal Grandfather   . Breast cancer Neg Hx     ROS: Review of Systems  Constitutional: Positive for weight loss.  Respiratory: Negative for shortness of breath.   Cardiovascular: Negative for chest pain.  Gastrointestinal: Negative for nausea and vomiting.  Musculoskeletal:       Negative for muscle weakness  Neurological:       Negative for lightheadedness  Endo/Heme/Allergies: Negative for polydipsia.       Negative for polyuria and hypoglycemia    PHYSICAL EXAM: Blood pressure 106/66, pulse 80, temperature 98.6 F (37 C), temperature source Oral, height 5\' 1"  (1.549 m), weight (!) 442 lb (200.5 kg), SpO2 96 %. Body mass index is 83.52 kg/m. Physical Exam  Constitutional: She is oriented to person, place, and time. She appears well-developed and well-nourished.  HENT:  Head: Normocephalic.  Neck: Normal range of motion.  Cardiovascular: Normal rate.  Pulmonary/Chest: Effort normal.  Musculoskeletal: Normal range of motion.  Neurological: She is alert and oriented to person, place, and time.  Skin: Skin is warm and dry.  Psychiatric: She has a normal mood and affect. Her behavior is normal.  Vitals reviewed.   RECENT LABS AND TESTS: BMET    Component Value Date/Time   NA 140 12/24/2017 0931   NA 141 02/21/2012 0947   K 4.9 12/24/2017 0931   K 3.7 02/21/2012 0947   CL 101 12/24/2017 0931   CL 110 (H) 02/21/2012 0947   CO2 23  12/24/2017 0931   CO2 25 02/21/2012 0947   GLUCOSE 102 (H) 12/24/2017 0931   GLUCOSE 101 (H) 09/05/2017 0917   GLUCOSE 145 (H) 02/21/2012 0947   BUN 16 12/24/2017 0931   BUN 13 02/21/2012 0947   CREATININE 0.91 12/24/2017 0931   CREATININE 0.69 02/21/2012 0947   CALCIUM 9.5 12/24/2017 0931   CALCIUM 8.4 (L) 02/21/2012 0947   GFRNONAA 75 12/24/2017 0931   GFRNONAA >60 02/21/2012 0947   GFRAA 86 12/24/2017 0931   GFRAA >60 02/21/2012 0947   Lab Results  Component Value Date   HGBA1C 5.8 (H) 12/24/2017   HGBA1C 5.9 09/05/2017  Lab Results  Component Value Date   INSULIN 37.1 (H) 12/24/2017   CBC    Component Value Date/Time   WBC 11.6 (H) 12/24/2017 0931   WBC 12.1 (H) 09/05/2017 0917   RBC 4.93 12/24/2017 0931   RBC 4.96 09/05/2017 0917   HGB 12.1 12/24/2017 0931   HCT 38.9 12/24/2017 0931   PLT 262.0 09/05/2017 0917   PLT 222 02/21/2012 0947   MCV 79 12/24/2017 0931   MCV 79 (L) 02/21/2012 0947   MCH 24.5 (L) 12/24/2017 0931   MCH 25.1 (L) 03/19/2016 1005   MCHC 31.1 (L) 12/24/2017 0931   MCHC 33.0 09/05/2017 0917   RDW 14.6 12/24/2017 0931   RDW 16.0 (H) 02/21/2012 0947   LYMPHSABS 2.5 12/24/2017 0931   MONOABS 0.6 09/05/2017 0917   EOSABS 0.3 12/24/2017 0931   BASOSABS 0.1 12/24/2017 0931   Iron/TIBC/Ferritin/ %Sat No results found for: IRON, TIBC, FERRITIN, IRONPCTSAT Lipid Panel     Component Value Date/Time   CHOL 205 (H) 12/24/2017 0931   TRIG 89 12/24/2017 0931   HDL 47 12/24/2017 0931   CHOLHDL 4 09/05/2017 0917   VLDL 19.0 09/05/2017 0917   LDLCALC 140 (H) 12/24/2017 0931   Hepatic Function Panel     Component Value Date/Time   PROT 6.5 12/24/2017 0931   PROT 6.5 02/21/2012 0947   ALBUMIN 4.3 12/24/2017 0931   ALBUMIN 3.2 (L) 02/21/2012 0947   AST 19 12/24/2017 0931   AST 22 02/21/2012 0947   ALT 24 12/24/2017 0931   ALT 30 02/21/2012 0947   ALKPHOS 69 12/24/2017 0931   ALKPHOS 89 02/21/2012 0947   BILITOT 0.5 12/24/2017 0931    BILITOT 1.1 (H) 02/21/2012 0947      Component Value Date/Time   TSH 2.270 12/24/2017 0931   TSH 1.91 09/05/2017 0917    Ref. Range 12/24/2017 09:31  Vitamin D, 25-Hydroxy Latest Ref Range: 30.0 - 100.0 ng/mL 47.0    ASSESSMENT AND PLAN: Prediabetes  Essential hypertension  Vitamin D deficiency  At risk for diabetes mellitus  Class 3 severe obesity with serious comorbidity and body mass index (BMI) greater than or equal to 70 in adult, unspecified obesity type [facility])  PLAN: Pre-Diabetes Ka will continue to work on weight loss, exercise, and decreasing simple carbohydrates in her diet to help decrease the risk of diabetes. We dicussed metformin including benefits and risks. She was informed that eating too many simple carbohydrates or too many calories at one sitting increases the likelihood of GI side effects. Bellagrace agrees to continue Metformin daily. Dotsie agreed to follow up with [name] as directed to monitor her progress.  Hypertension We discussed sodium restriction, working on healthy weight loss, and a regular exercise program as the means to achieve improved blood pressure control. Ariaunna agreed with this plan and agreed to follow up as directed. We will continue to monitor her blood pressure as well as her progress with the above lifestyle modifications. She will continue her medications as prescribed and will watch for signs of hypotension as she continues her lifestyle modifications.  Vitamin D Deficiency Elleana was informed that low vitamin D levels contributes to fatigue and are associated with obesity, breast, and colon cancer. She agrees to continue to take OTC vitamin D daily and will follow up for routine testing of vitamin D, at least 2-3 times per year. She was informed of the risk of over-replacement of vitamin D and agrees to not increase her dose unless she discusses this  with [name] first.  Obesity Ellayna is currently in the action stage of change. As such, her  goal is to continue with weight loss efforts She has agreed to Category 4 eating plan.  Benetta has been instructed to work up to a goal of 150 minutes of combined cardio and strengthening exercise per week for weight loss and overall health benefits. We discussed the following Behavioral Modification Strategies today: increasing lean protein intake, decreasing simple carbohydrates , increasing vegetables, increasing water intake, no skipping meals, better snacking choices,  and work on meal planning and easy cooking plans.   Giorgia has agreed to follow up with our clinic in 2 weeks. She was informed of the importance of frequent follow up visits to maximize her success with intensive lifestyle modifications for her multiple health conditions.   OBESITY BEHAVIORAL INTERVENTION VISIT  Today's visit was # 2   Starting weight: 454 lb Starting date: 12/24/17  Today's weight : 442 lb Today's date: 01/07/18 Total lbs lost to date: 12 lb .   ASK: We discussed the diagnosis of obesity with [name] today and Bonney agreed to give [name] permission to discuss obesity behavioral modification therapy today.  ASSESS: Jaydeen has the diagnosis of obesity and her BMI today is 84.24 Lajune is in the action stage of change   ADVISE: Adel was educated on the multiple health risks of obesity as well as the benefit of weight loss to improve her health. She was advised of the need for long term treatment and the importance of lifestyle modifications to improve her current health and to decrease her risk of future health problems.  AGREE: Multiple dietary modification options and treatment options were discussed and  Anastashia agreed to follow the recommendations documented in the above note.  ARRANGE: Shyenne was educated on the importance of frequent visits to treat obesity as outlined per CMS and USPSTF guidelines and agreed to schedule her next follow up appointment today.  Leary Roca, am  acting as transcriptionist for [employer], DO   I have reviewed the above documentation for accuracy and completeness, and I agree with the above. -Jearld Lesch, DO

## 2018-01-22 ENCOUNTER — Encounter (INDEPENDENT_AMBULATORY_CARE_PROVIDER_SITE_OTHER): Payer: Self-pay | Admitting: Bariatrics

## 2018-01-22 ENCOUNTER — Ambulatory Visit (INDEPENDENT_AMBULATORY_CARE_PROVIDER_SITE_OTHER): Payer: 59 | Admitting: Bariatrics

## 2018-01-22 VITALS — BP 107/71 | HR 94 | Temp 98.1°F | Ht 61.0 in | Wt >= 6400 oz

## 2018-01-22 DIAGNOSIS — I10 Essential (primary) hypertension: Secondary | ICD-10-CM | POA: Diagnosis not present

## 2018-01-22 DIAGNOSIS — R7303 Prediabetes: Secondary | ICD-10-CM | POA: Diagnosis not present

## 2018-01-22 DIAGNOSIS — E559 Vitamin D deficiency, unspecified: Secondary | ICD-10-CM | POA: Diagnosis not present

## 2018-01-22 DIAGNOSIS — Z6841 Body Mass Index (BMI) 40.0 and over, adult: Secondary | ICD-10-CM | POA: Diagnosis not present

## 2018-01-23 NOTE — Progress Notes (Signed)
Office: [phone]  /  Fax: [phone]   HPI:   Chief Complaint: OBESITY Belinda Garrett is here to discuss her progress with her obesity treatment plan. She is on the Category 4 plan and is following her eating plan approximately 80 % of the time. She states she is exercising 0 minutes 0 times per week. Belinda Garrett states that she has "never been committed". She notes that she is not having inappropriate hunger. She has switched to low calorie cremer.  Her weight is (!) 431 lb (195.5 kg) today and has had a weight loss of 11 pounds over a period of 2 weeks since her last visit. She has lost 23 lbs since starting treatment with [name].  Pre-Diabetes Belinda Garrett has a diagnosis of pre-diabetes based on her elevated Hgb A1c at 5.8 and insulin at 37.1 and was informed this puts her at greater risk of developing diabetes. She is taking metformin currently and continues to work on diet and exercise to decrease risk of diabetes. She denies nausea or hypoglycemia.  Vitamin D Deficiency Belinda Garrett has a diagnosis of vitamin D deficiency. She is currently taking OTC Vit D, last Vit D level was 47.0. She denies nausea, vomiting or muscle weakness.  Hypertension Belinda Garrett is a 47 y.o. female with hypertension. Belinda Garrett is taking hydrochlorothiazide and lisinopril. Her blood pressure is well controlled and she denies lightheadedness. She is working weight loss to help control her blood pressure with the goal of decreasing her risk of heart attack and stroke.   ALLERGIES: Allergies  Allergen Reactions  . Dust Mite Extract   . Latex   . Pollen Extract     MEDICATIONS: Current Outpatient Medications on File Prior to Visit  Medication Sig Dispense Refill  . Cyanocobalamin (VITAMIN B-12 PO) Take 2,500 mcg by mouth daily.     [name] FLUoxetine (PROZAC) 20 MG tablet Take 2 tablets (40 mg total) by mouth daily. 180 tablet 3  . glucosamine-chondroitin 500-400 MG tablet Take 1 tablet by mouth 3 (three) times daily.    .  hydrochlorothiazide (HYDRODIURIL) 25 MG tablet Take 1 tablet (25 mg total) by mouth daily. In am 90 tablet 3  . lisinopril (PRINIVIL,ZESTRIL) 30 MG tablet Take 1 tablet (30 mg total) by mouth daily. 90 tablet 1  . loratadine (CLARITIN) 10 MG tablet Take 10 mg by mouth daily.    . medroxyPROGESTERone (PROVERA) 2.5 MG tablet Take 2.5 mg by mouth daily.     . metFORMIN (GLUCOPHAGE) 500 MG tablet daily with food    . Multiple Vitamin (MULTIVITAMIN) capsule Take 1 capsule by mouth daily.    . Naproxen Sodium (ALEVE) 220 MG CAPS Take 440 mg by mouth daily.    . Omega-3 Fatty Acids (FISH OIL) 1000 MG CAPS Take 1,000 mg by mouth daily.     No current facility-administered medications on file prior to visit.     PAST MEDICAL HISTORY: Past Medical History:  Diagnosis Date  . Abnormal menses   . Allergy   . Arthritis   . Back pain   . Chicken pox   . Depression   . Diabetes mellitus without complication ([place])   . Hypertension   . Joint pain   . Lymphedema   . Palpitations   . Prediabetes   . Seasonal allergies   . Sleep apnea   . Stasis dermatitis of both legs     PAST SURGICAL HISTORY: Past Surgical History:  Procedure Laterality Date  . WISDOM TOOTH EXTRACTION  SOCIAL HISTORY: Social History   Tobacco Use  . Smoking status: Never Smoker  . Smokeless tobacco: Never Used  Substance Use Topics  . Alcohol use: Not Currently    Alcohol/week: 0.0 standard drinks  . Drug use: Not Currently    FAMILY HISTORY: Family History  Problem Relation Age of Onset  . Arthritis Mother   . Depression Mother   . Hypertension Mother   . Thyroid disease Mother   . Obesity Mother   . Depression Father   . Parkinson's disease Father   . Dementia Father   . Obesity Father   . Arthritis Maternal Grandmother   . Heart disease Maternal Grandmother   . Hypertension Maternal Grandmother   . Hyperlipidemia Maternal Grandmother   . Stroke Maternal Grandmother   . Alcohol abuse Maternal  Grandfather   . Arthritis Maternal Grandfather   . Depression Maternal Grandfather   . Hearing loss Maternal Grandfather   . Heart disease Maternal Grandfather   . Stroke Maternal Grandfather   . Arthritis Paternal Grandmother   . Arthritis Paternal Grandfather   . Depression Paternal Grandfather   . Heart disease Paternal Grandfather   . Hypertension Paternal Grandfather   . Stroke Paternal Grandfather   . Breast cancer Neg Hx     ROS: Review of Systems  Constitutional: Positive for weight loss.  Gastrointestinal: Negative for nausea and vomiting.  Musculoskeletal:       Negative muscle weakness  Neurological:       Negative lightheadedness  Endo/Heme/Allergies:       Negative hypoglycemia    PHYSICAL EXAM: Blood pressure 107/71, pulse 94, temperature 98.1 F (36.7 C), temperature source Oral, height 5\' 1"  (1.549 m), weight (!) 442 lb (200.5 kg), SpO2 94 %. Body mass index is 83.52 kg/m. Physical Exam  Constitutional: She is oriented to person, place, and time. She appears well-developed and well-nourished.  Cardiovascular: Normal rate.  Pulmonary/Chest: Effort normal.  Musculoskeletal: Normal range of motion.  Neurological: She is oriented to person, place, and time.  Skin: Skin is warm and dry.  Psychiatric: She has a normal mood and affect. Her behavior is normal.  Vitals reviewed.   RECENT LABS AND TESTS: BMET    Component Value Date/Time   NA 140 12/24/2017 0931   NA 141 02/21/2012 0947   K 4.9 12/24/2017 0931   K 3.7 02/21/2012 0947   CL 101 12/24/2017 0931   CL 110 (H) 02/21/2012 0947   CO2 23 12/24/2017 0931   CO2 25 02/21/2012 0947   GLUCOSE 102 (H) 12/24/2017 0931   GLUCOSE 101 (H) 09/05/2017 0917   GLUCOSE 145 (H) 02/21/2012 0947   BUN 16 12/24/2017 0931   BUN 13 02/21/2012 0947   CREATININE 0.91 12/24/2017 0931   CREATININE 0.69 02/21/2012 0947   CALCIUM 9.5 12/24/2017 0931   CALCIUM 8.4 (L) 02/21/2012 0947   GFRNONAA 75 12/24/2017 0931    GFRNONAA >60 02/21/2012 0947   GFRAA 86 12/24/2017 0931   GFRAA >60 02/21/2012 0947   Lab Results  Component Value Date   HGBA1C 5.8 (H) 12/24/2017   HGBA1C 5.9 09/05/2017   Lab Results  Component Value Date   INSULIN 37.1 (H) 12/24/2017   CBC    Component Value Date/Time   WBC 11.6 (H) 12/24/2017 0931   WBC 12.1 (H) 09/05/2017 0917   RBC 4.93 12/24/2017 0931   RBC 4.96 09/05/2017 0917   HGB 12.1 12/24/2017 0931   HCT 38.9 12/24/2017 0931   PLT 262.0  09/05/2017 0917   PLT 222 02/21/2012 0947   MCV 79 12/24/2017 0931   MCV 79 (L) 02/21/2012 0947   MCH 24.5 (L) 12/24/2017 0931   MCH 25.1 (L) 03/19/2016 1005   MCHC 31.1 (L) 12/24/2017 0931   MCHC 33.0 09/05/2017 0917   RDW 14.6 12/24/2017 0931   RDW 16.0 (H) 02/21/2012 0947   LYMPHSABS 2.5 12/24/2017 0931   MONOABS 0.6 09/05/2017 0917   EOSABS 0.3 12/24/2017 0931   BASOSABS 0.1 12/24/2017 0931   Iron/TIBC/Ferritin/ %Sat No results found for: IRON, TIBC, FERRITIN, IRONPCTSAT Lipid Panel     Component Value Date/Time   CHOL 205 (H) 12/24/2017 0931   TRIG 89 12/24/2017 0931   HDL 47 12/24/2017 0931   CHOLHDL 4 09/05/2017 0917   VLDL 19.0 09/05/2017 0917   LDLCALC 140 (H) 12/24/2017 0931   Hepatic Function Panel     Component Value Date/Time   PROT 6.5 12/24/2017 0931   PROT 6.5 02/21/2012 0947   ALBUMIN 4.3 12/24/2017 0931   ALBUMIN 3.2 (L) 02/21/2012 0947   AST 19 12/24/2017 0931   AST 22 02/21/2012 0947   ALT 24 12/24/2017 0931   ALT 30 02/21/2012 0947   ALKPHOS 69 12/24/2017 0931   ALKPHOS 89 02/21/2012 0947   BILITOT 0.5 12/24/2017 0931   BILITOT 1.1 (H) 02/21/2012 0947      Component Value Date/Time   TSH 2.270 12/24/2017 0931   TSH 1.91 09/05/2017 0917  Results for LAYCE, SPRUNG "ANGIE" (MRN [id number]) as of 01/23/2018 16:48  Ref. Range 12/24/2017 09:31  Vitamin D, 25-Hydroxy Latest Ref Range: 30.0 - 100.0 ng/mL 47.0    ASSESSMENT AND PLAN: Prediabetes  Vitamin D deficiency  Essential  hypertension  Class 3 severe obesity with serious comorbidity and body mass index (BMI) greater than or equal to 70 in adult, unspecified obesity type [facility])  PLAN:  Pre-Diabetes Nazyia will continue to work on weight loss, exercise, and decreasing simple carbohydrates in her diet to help decrease the risk of diabetes. We dicussed metformin including benefits and risks. She was informed that eating too many simple carbohydrates or too many calories at one sitting increases the likelihood of GI side effects. Sharol agrees to continue taking metformin, and she agrees to follow up with our clinic in 2 weeks as directed to monitor her progress.  Vitamin D Deficiency Tylee was informed that low vitamin D levels contributes to fatigue and are associated with obesity, breast, and colon cancer. Keyari agrees to continue taking OTC Vit D and will follow up for routine testing of vitamin D, at least 2-3 times per year. She was informed of the risk of over-replacement of vitamin D and agrees to not increase her dose unless she discusses this with [name] first. Joli agrees to follow up with our clinic in 2 weeks.  Hypertension We discussed sodium restriction, working on healthy weight loss, and a regular exercise program as the means to achieve improved blood pressure control. Marielle agreed with this plan and agreed to follow up as directed. We will continue to monitor her blood pressure as well as her progress with the above lifestyle modifications. Tyona agrees to continue her anti-hypertensive medications and will watch for signs of hypotension as she continues her lifestyle modifications. Iviana agrees to follow up with our clinic in 2 weeks.  I spent > than 50% of the 15 minute visit on counseling as documented in the note.  Obesity Chakira is currently in the action stage of change.  As such, her goal is to continue with weight loss efforts She has agreed to follow the Category 4 plan Ameliya has been  instructed to work up to a goal of 150 minutes of combined cardio and strengthening exercise per week for weight loss and overall health benefits. We discussed the following Behavioral Modification Strategies today: increasing lean protein intake, decreasing simple carbohydrates, increasing vegetables, decrease eating out, work on meal planning and easy cooking plans, increase H20 intake, and no skipping meals Yvonda was encouraged to eat everything on the plan.  Tejasvi has agreed to follow up with our clinic in 2 weeks. She was informed of the importance of frequent follow up visits to maximize her success with intensive lifestyle modifications for her multiple health conditions.   OBESITY BEHAVIORAL INTERVENTION VISIT  Today's visit was # 3   Starting weight: 454 lbs Starting date: 12/24/17 Today's weight : 431 lbs Today's date: 01/22/2018 Total lbs lost to date: 23    ASK: We discussed the diagnosis of obesity with [name] today and Levada Dy agreed to give [name] permission to discuss obesity behavioral modification therapy today.  ASSESS: Marlyss has the diagnosis of obesity and her BMI today is 45.48 Miniya is in the action stage of change   ADVISE: Orabelle was educated on the multiple health risks of obesity as well as the benefit of weight loss to improve her health. She was advised of the need for long term treatment and the importance of lifestyle modifications to improve her current health and to decrease her risk of future health problems.  AGREE: Multiple dietary modification options and treatment options were discussed and  Dorsie agreed to follow the recommendations documented in the above note.  ARRANGE: Alexanderia was educated on the importance of frequent visits to treat obesity as outlined per CMS and USPSTF guidelines and agreed to schedule her next follow up appointment today.  Wilhemena Durie, am acting as transcriptionist for [employer], DO  I have reviewed the  above documentation for accuracy and completeness, and I agree with the above. -Jearld Lesch, DO

## 2018-01-24 ENCOUNTER — Encounter (INDEPENDENT_AMBULATORY_CARE_PROVIDER_SITE_OTHER): Payer: Self-pay | Admitting: Bariatrics

## 2018-02-06 ENCOUNTER — Ambulatory Visit (INDEPENDENT_AMBULATORY_CARE_PROVIDER_SITE_OTHER): Payer: 59 | Admitting: Bariatrics

## 2018-02-06 VITALS — BP 114/73 | HR 85 | Temp 98.4°F | Ht 61.0 in | Wt >= 6400 oz

## 2018-02-06 DIAGNOSIS — I10 Essential (primary) hypertension: Secondary | ICD-10-CM | POA: Diagnosis not present

## 2018-02-06 DIAGNOSIS — R7303 Prediabetes: Secondary | ICD-10-CM | POA: Diagnosis not present

## 2018-02-06 DIAGNOSIS — Z6841 Body Mass Index (BMI) 40.0 and over, adult: Secondary | ICD-10-CM | POA: Diagnosis not present

## 2018-02-11 DIAGNOSIS — L718 Other rosacea: Secondary | ICD-10-CM | POA: Diagnosis not present

## 2018-02-11 DIAGNOSIS — D1801 Hemangioma of skin and subcutaneous tissue: Secondary | ICD-10-CM | POA: Diagnosis not present

## 2018-02-11 DIAGNOSIS — L820 Inflamed seborrheic keratosis: Secondary | ICD-10-CM | POA: Diagnosis not present

## 2018-02-11 DIAGNOSIS — L538 Other specified erythematous conditions: Secondary | ICD-10-CM | POA: Diagnosis not present

## 2018-02-12 NOTE — Progress Notes (Signed)
Office: [phone]  /  Fax: [phone]   HPI:   Chief Complaint: OBESITY Belinda Garrett is here to discuss her progress with her obesity treatment plan. She is on the Category 4 plan and is following her eating plan approximately 70 % of the time. She states she is exercising 0 minutes 0 times per week. Belinda Garrett is not struggling with the plan. She states that she is practicing mindfulness.  Her weight is (!) 426 lb (193.2 kg) today and has had a weight loss of 5 pounds over a period of 2 weeks since her last visit. She has lost 28 lbs since starting treatment with [name].  Pre-Diabetes Belinda Garrett has a diagnosis of pre-diabetes based on her elevated Hgb A1c and was informed this puts her at greater risk of developing diabetes. Her A1c was 58 and Insulin was 37.1 on 12/24/17. She is taking metformin currently and continues to work on diet and exercise to decrease risk of diabetes. She denies polyphagia.  Hypertension Belinda Garrett is a 47 y.o. female with hypertension. She is working on weight loss to help control her blood pressure with the goal of decreasing her risk of heart attack and stroke. Belinda Garrett's blood pressure is currently well controlled. Belinda Garrett is taking HCTZ 25mg  and lisinopril 30mg .  ALLERGIES: Allergies  Allergen Reactions  . Dust Mite Extract   . Latex   . Pollen Extract     MEDICATIONS: Current Outpatient Medications on File Prior to Visit  Medication Sig Dispense Refill  . Cyanocobalamin (VITAMIN B-12 PO) Take 2,500 mcg by mouth daily.     Belinda Garrett Kitchen FLUoxetine (PROZAC) 20 MG tablet Take 2 tablets (40 mg total) by mouth daily. 180 tablet 3  . glucosamine-chondroitin 500-400 MG tablet Take 1 tablet by mouth 3 (three) times daily.    . hydrochlorothiazide (HYDRODIURIL) 25 MG tablet Take 1 tablet (25 mg total) by mouth daily. In am 90 tablet 3  . lisinopril (PRINIVIL,ZESTRIL) 30 MG tablet Take 1 tablet (30 mg total) by mouth daily. 90 tablet 1  . loratadine (CLARITIN) 10 MG tablet Take  10 mg by mouth daily.    . medroxyPROGESTERone (PROVERA) 2.5 MG tablet Take 2.5 mg by mouth daily.     . metFORMIN (GLUCOPHAGE) 500 MG tablet daily with food    . Multiple Vitamin (MULTIVITAMIN) capsule Take 1 capsule by mouth daily.    . Naproxen Sodium (ALEVE) 220 MG CAPS Take 440 mg by mouth daily.    . Omega-3 Fatty Acids (FISH OIL) 1000 MG CAPS Take 1,000 mg by mouth daily.     No current facility-administered medications on file prior to visit.     PAST MEDICAL HISTORY: Past Medical History:  Diagnosis Date  . Abnormal menses   . Allergy   . Arthritis   . Back pain   . Chicken pox   . Depression   . Diabetes mellitus without complication ([place])   . Hypertension   . Joint pain   . Lymphedema   . Palpitations   . Prediabetes   . Seasonal allergies   . Sleep apnea   . Stasis dermatitis of both legs     PAST SURGICAL HISTORY: Past Surgical History:  Procedure Laterality Date  . WISDOM TOOTH EXTRACTION      SOCIAL HISTORY: Social History   Tobacco Use  . Smoking status: Never Smoker  . Smokeless tobacco: Never Used  Substance Use Topics  . Alcohol use: Not Currently    Alcohol/week: 0.0 standard drinks  .  Drug use: Not Currently    FAMILY HISTORY: Family History  Problem Relation Age of Onset  . Arthritis Mother   . Depression Mother   . Hypertension Mother   . Thyroid disease Mother   . Obesity Mother   . Depression Father   . Parkinson's disease Father   . Dementia Father   . Obesity Father   . Arthritis Maternal Grandmother   . Heart disease Maternal Grandmother   . Hypertension Maternal Grandmother   . Hyperlipidemia Maternal Grandmother   . Stroke Maternal Grandmother   . Alcohol abuse Maternal Grandfather   . Arthritis Maternal Grandfather   . Depression Maternal Grandfather   . Hearing loss Maternal Grandfather   . Heart disease Maternal Grandfather   . Stroke Maternal Grandfather   . Arthritis Paternal Grandmother   . Arthritis Paternal  Grandfather   . Depression Paternal Grandfather   . Heart disease Paternal Grandfather   . Hypertension Paternal Grandfather   . Stroke Paternal Grandfather   . Breast cancer Neg Hx     ROS: Review of Systems  Constitutional: Positive for weight loss.  Endo/Heme/Allergies:       Negative for polyphagia.    PHYSICAL EXAM: Blood pressure 114/73, pulse 85, temperature 98.4 F (36.9 C), temperature source Oral, height 5\' 1"  (1.549 m), weight (!) 426 lb (193.2 kg), SpO2 97 %. Body mass index is 80.49 kg/m. Physical Exam  Constitutional: She is oriented to person, place, and time. She appears well-developed and well-nourished.  Cardiovascular: Normal rate.  Pulmonary/Chest: Effort normal.  Musculoskeletal: Normal range of motion.  Neurological: She is oriented to person, place, and time.  Skin: Skin is warm and dry.  Psychiatric: She has a normal mood and affect. Her behavior is normal.  Vitals reviewed.   RECENT LABS AND TESTS: BMET    Component Value Date/Time   NA 140 12/24/2017 0931   NA 141 02/21/2012 0947   K 4.9 12/24/2017 0931   K 3.7 02/21/2012 0947   CL 101 12/24/2017 0931   CL 110 (H) 02/21/2012 0947   CO2 23 12/24/2017 0931   CO2 25 02/21/2012 0947   GLUCOSE 102 (H) 12/24/2017 0931   GLUCOSE 101 (H) 09/05/2017 0917   GLUCOSE 145 (H) 02/21/2012 0947   BUN 16 12/24/2017 0931   BUN 13 02/21/2012 0947   CREATININE 0.91 12/24/2017 0931   CREATININE 0.69 02/21/2012 0947   CALCIUM 9.5 12/24/2017 0931   CALCIUM 8.4 (L) 02/21/2012 0947   GFRNONAA 75 12/24/2017 0931   GFRNONAA >60 02/21/2012 0947   GFRAA 86 12/24/2017 0931   GFRAA >60 02/21/2012 0947   Lab Results  Component Value Date   HGBA1C 5.8 (H) 12/24/2017   HGBA1C 5.9 09/05/2017   Lab Results  Component Value Date   INSULIN 37.1 (H) 12/24/2017   CBC    Component Value Date/Time   WBC 11.6 (H) 12/24/2017 0931   WBC 12.1 (H) 09/05/2017 0917   RBC 4.93 12/24/2017 0931   RBC 4.96 09/05/2017 0917    HGB 12.1 12/24/2017 0931   HCT 38.9 12/24/2017 0931   PLT 262.0 09/05/2017 0917   PLT 222 02/21/2012 0947   MCV 79 12/24/2017 0931   MCV 79 (L) 02/21/2012 0947   MCH 24.5 (L) 12/24/2017 0931   MCH 25.1 (L) 03/19/2016 1005   MCHC 31.1 (L) 12/24/2017 0931   MCHC 33.0 09/05/2017 0917   RDW 14.6 12/24/2017 0931   RDW 16.0 (H) 02/21/2012 0947   LYMPHSABS 2.5 12/24/2017  0931   MONOABS 0.6 09/05/2017 0917   EOSABS 0.3 12/24/2017 0931   BASOSABS 0.1 12/24/2017 0931   Iron/TIBC/Ferritin/ %Sat No results found for: IRON, TIBC, FERRITIN, IRONPCTSAT Lipid Panel     Component Value Date/Time   CHOL 205 (H) 12/24/2017 0931   TRIG 89 12/24/2017 0931   HDL 47 12/24/2017 0931   CHOLHDL 4 09/05/2017 0917   VLDL 19.0 09/05/2017 0917   LDLCALC 140 (H) 12/24/2017 0931   Hepatic Function Panel     Component Value Date/Time   PROT 6.5 12/24/2017 0931   PROT 6.5 02/21/2012 0947   ALBUMIN 4.3 12/24/2017 0931   ALBUMIN 3.2 (L) 02/21/2012 0947   AST 19 12/24/2017 0931   AST 22 02/21/2012 0947   ALT 24 12/24/2017 0931   ALT 30 02/21/2012 0947   ALKPHOS 69 12/24/2017 0931   ALKPHOS 89 02/21/2012 0947   BILITOT 0.5 12/24/2017 0931   BILITOT 1.1 (H) 02/21/2012 0947      Component Value Date/Time   TSH 2.270 12/24/2017 0931   TSH 1.91 09/05/2017 0917   Results for ARMA, REINING "ANGIE" (MRN [id number]) as of 02/12/2018 05:32  Ref. Range 12/24/2017 09:31  Vitamin D, 25-Hydroxy Latest Ref Range: 30.0 - 100.0 ng/mL 47.0   ASSESSMENT AND PLAN: Prediabetes  Essential hypertension  Class 3 severe obesity with serious comorbidity and body mass index (BMI) greater than or equal to 70 in adult, unspecified obesity type [facility])  PLAN:  Pre-Diabetes Belinda Garrett will continue to work on weight loss, exercise, and decreasing simple carbohydrates in her diet to help decrease the risk of diabetes. She was informed that eating too many simple carbohydrates or too many calories at one sitting increases  the likelihood of GI side effects. Belinda Garrett agreed to continue taking metformin for now and a prescription was not written today. Belinda Garrett agreed to follow up with [name] as directed to monitor her progress in 2 weeks.  Hypertension We discussed sodium restriction, working on healthy weight loss, and a regular exercise program as the means to achieve improved blood pressure control. We will continue to monitor her blood pressure as well as her progress with the above lifestyle modifications. She will continue her medications as prescribed and will watch for signs of hypotension as she continues her lifestyle modifications. Belinda Garrett agreed with this plan and agreed to follow up as directed.  I spent > than 50% of the 15 minute visit on counseling as documented in the note.  Obesity Belinda Garrett is currently in the action stage of change. As such, her goal is to continue with weight loss efforts. She has agreed to follow the Category 4 plan. She was given the "Thanksgiving" information handout. Belinda Garrett has been instructed to work up to a goal of 150 minutes of combined cardio and strengthening exercise per week for weight loss and overall health benefits. We discussed the following Behavioral Modification Strategies today: increasing lean protein intake, decreasing simple carbohydrates, increasing vegetables, increase H2O intake, decrease eating out no skipping meals, and work on meal planning and easy cooking plans.  Belinda Garrett has agreed to follow up with our clinic in 2 weeks. She was informed of the importance of frequent follow up visits to maximize her success with intensive lifestyle modifications for her multiple health conditions.   OBESITY BEHAVIORAL INTERVENTION VISIT  Today's visit was # 4   Starting weight: 073 Starting date: 12/24/17 Today's weight : Weight: (!) 426 lb (193.2 kg)  Today's date: 02/06/2018 Total lbs lost to date:  28  ASK: We discussed the diagnosis of obesity with Belinda Garrett  today and Belinda Garrett agreed to give [name] permission to discuss obesity behavioral modification therapy today.  ASSESS: Belinda Garrett has the diagnosis of obesity and her BMI today is 80.53. Belinda Garrett is in the action stage of change.   ADVISE: Belinda Garrett was educated on the multiple health risks of obesity as well as the benefit of weight loss to improve her health. She was advised of the need for long term treatment and the importance of lifestyle modifications to improve her current health and to decrease her risk of future health problems.  AGREE: Multiple dietary modification options and treatment options were discussed and Belinda Garrett agreed to follow the recommendations documented in the above note.  ARRANGE: Belinda Garrett was educated on the importance of frequent visits to treat obesity as outlined per CMS and USPSTF guidelines and agreed to schedule her next follow up appointment today.  I, Marcille Blanco, am acting as Location manager for [employer]. Owens Shark, DO  I have reviewed the above documentation for accuracy and completeness, and I agree with the above. -Jearld Lesch, DO

## 2018-02-26 ENCOUNTER — Ambulatory Visit (INDEPENDENT_AMBULATORY_CARE_PROVIDER_SITE_OTHER): Payer: 59 | Admitting: Bariatrics

## 2018-02-27 ENCOUNTER — Encounter (INDEPENDENT_AMBULATORY_CARE_PROVIDER_SITE_OTHER): Payer: Self-pay | Admitting: Bariatrics

## 2018-02-27 ENCOUNTER — Ambulatory Visit (INDEPENDENT_AMBULATORY_CARE_PROVIDER_SITE_OTHER): Payer: 59 | Admitting: Bariatrics

## 2018-02-27 VITALS — BP 116/71 | Temp 98.1°F | Ht 61.0 in | Wt >= 6400 oz

## 2018-02-27 DIAGNOSIS — I10 Essential (primary) hypertension: Secondary | ICD-10-CM

## 2018-02-27 DIAGNOSIS — Z6841 Body Mass Index (BMI) 40.0 and over, adult: Secondary | ICD-10-CM

## 2018-02-27 DIAGNOSIS — R7303 Prediabetes: Secondary | ICD-10-CM | POA: Diagnosis not present

## 2018-02-28 NOTE — Progress Notes (Signed)
Office: [phone]  /  Fax: [phone]   HPI:   Chief Complaint: OBESITY Belinda Garrett is here to discuss her progress with her obesity treatment plan. She is on the Category 4 plan and is following her eating plan approximately 45 % of the time. She states she is exercising 0 minutes 0 times per week. Belinda Garrett has struggled during the holiday (Thanksgiving and birthday). She is doing well with breakfast and lunch. Belinda Garrett hunger is controlled and she denies stress eating. Her weight is (!) 437 lb (198.2 kg) today and has had a weight loss of 11 pounds over a period of 3 weeks since her last visit. She has lost 17 lbs since starting treatment with [name].  Pre-Diabetes Belinda Garrett has a diagnosis of prediabetes based on her elevated Hgb A1c of 5.8 and fasting insulin of 37.1 and was informed this puts her at greater risk of developing diabetes. She is taking metformin 500 mg currently and continues to work on diet and exercise to decrease risk of diabetes. She denies hypoglycemia.  Hypertension Belinda Garrett is a 47 y.o. female with hypertension. Belinda Garrett denies chest pain or shortness of breath on exertion. She is working weight loss to help control her blood pressure with the goal of decreasing her risk of heart attack and stroke. Belinda Garrett blood pressure is currently controlled.  ALLERGIES: Allergies  Allergen Reactions  . Dust Mite Extract   . Latex   . Pollen Extract     MEDICATIONS: Current Outpatient Medications on File Prior to Visit  Medication Sig Dispense Refill  . Cyanocobalamin (VITAMIN B-12 PO) Take 2,500 mcg by mouth daily.     [name] FLUoxetine (PROZAC) 20 MG tablet Take 2 tablets (40 mg total) by mouth daily. 180 tablet 3  . glucosamine-chondroitin 500-400 MG tablet Take 1 tablet by mouth 3 (three) times daily.    . hydrochlorothiazide (HYDRODIURIL) 25 MG tablet Take 1 tablet (25 mg total) by mouth daily. In am 90 tablet 3  . lisinopril (PRINIVIL,ZESTRIL) 30 MG tablet Take 1 tablet  (30 mg total) by mouth daily. 90 tablet 1  . loratadine (CLARITIN) 10 MG tablet Take 10 mg by mouth daily.    . medroxyPROGESTERone (PROVERA) 2.5 MG tablet Take 2.5 mg by mouth daily.     . metFORMIN (GLUCOPHAGE) 500 MG tablet daily with food    . Multiple Vitamin (MULTIVITAMIN) capsule Take 1 capsule by mouth daily.    . Naproxen Sodium (ALEVE) 220 MG CAPS Take 440 mg by mouth daily.    . Omega-3 Fatty Acids (FISH OIL) 1000 MG CAPS Take 1,000 mg by mouth daily.     No current facility-administered medications on file prior to visit.     PAST MEDICAL HISTORY: Past Medical History:  Diagnosis Date  . Abnormal menses   . Allergy   . Arthritis   . Back pain   . Chicken pox   . Depression   . Diabetes mellitus without complication ([place])   . Hypertension   . Joint pain   . Lymphedema   . Palpitations   . Prediabetes   . Seasonal allergies   . Sleep apnea   . Stasis dermatitis of both legs     PAST SURGICAL HISTORY: Past Surgical History:  Procedure Laterality Date  . WISDOM TOOTH EXTRACTION      SOCIAL HISTORY: Social History   Tobacco Use  . Smoking status: Never Smoker  . Smokeless tobacco: Never Used  Substance Use Topics  . Alcohol  use: Not Currently    Alcohol/week: 0.0 standard drinks  . Drug use: Not Currently    FAMILY HISTORY: Family History  Problem Relation Age of Onset  . Arthritis Mother   . Depression Mother   . Hypertension Mother   . Thyroid disease Mother   . Obesity Mother   . Depression Father   . Parkinson's disease Father   . Dementia Father   . Obesity Father   . Arthritis Maternal Grandmother   . Heart disease Maternal Grandmother   . Hypertension Maternal Grandmother   . Hyperlipidemia Maternal Grandmother   . Stroke Maternal Grandmother   . Alcohol abuse Maternal Grandfather   . Arthritis Maternal Grandfather   . Depression Maternal Grandfather   . Hearing loss Maternal Grandfather   . Heart disease Maternal Grandfather   .  Stroke Maternal Grandfather   . Arthritis Paternal Grandmother   . Arthritis Paternal Grandfather   . Depression Paternal Grandfather   . Heart disease Paternal Grandfather   . Hypertension Paternal Grandfather   . Stroke Paternal Grandfather   . Breast cancer Neg Hx     ROS: Review of Systems  Constitutional: Negative for weight loss.  Respiratory: Negative for shortness of breath (on exertion).   Cardiovascular: Negative for chest pain.  Endo/Heme/Allergies:       Negative or hypoglycemia    PHYSICAL EXAM: Temperature 98.1 F (36.7 C), temperature source Oral, height 5\' 1"  (1.549 m), weight (!) 437 lb (198.2 kg), SpO2 98 %. Body mass index is 82.57 kg/m. Physical Exam Vitals signs reviewed.  Constitutional:      Appearance: Normal appearance. She is well-developed. She is obese.  Cardiovascular:     Rate and Rhythm: Normal rate.  Pulmonary:     Effort: Pulmonary effort is normal.  Musculoskeletal: Normal range of motion.  Skin:    General: Skin is warm and dry.  Neurological:     Mental Status: She is alert and oriented to person, place, and time.  Psychiatric:        Mood and Affect: Mood normal.        Behavior: Behavior normal.     RECENT LABS AND TESTS: BMET    Component Value Date/Time   NA 140 12/24/2017 0931   NA 141 02/21/2012 0947   K 4.9 12/24/2017 0931   K 3.7 02/21/2012 0947   CL 101 12/24/2017 0931   CL 110 (H) 02/21/2012 0947   CO2 23 12/24/2017 0931   CO2 25 02/21/2012 0947   GLUCOSE 102 (H) 12/24/2017 0931   GLUCOSE 101 (H) 09/05/2017 0917   GLUCOSE 145 (H) 02/21/2012 0947   BUN 16 12/24/2017 0931   BUN 13 02/21/2012 0947   CREATININE 0.91 12/24/2017 0931   CREATININE 0.69 02/21/2012 0947   CALCIUM 9.5 12/24/2017 0931   CALCIUM 8.4 (L) 02/21/2012 0947   GFRNONAA 75 12/24/2017 0931   GFRNONAA >60 02/21/2012 0947   GFRAA 86 12/24/2017 0931   GFRAA >60 02/21/2012 0947   Lab Results  Component Value Date   HGBA1C 5.8 (H) 12/24/2017     HGBA1C 5.9 09/05/2017   Lab Results  Component Value Date   INSULIN 37.1 (H) 12/24/2017   CBC    Component Value Date/Time   WBC 11.6 (H) 12/24/2017 0931   WBC 12.1 (H) 09/05/2017 0917   RBC 4.93 12/24/2017 0931   RBC 4.96 09/05/2017 0917   HGB 12.1 12/24/2017 0931   HCT 38.9 12/24/2017 0931   PLT 262.0 09/05/2017 6203  PLT 222 02/21/2012 0947   MCV 79 12/24/2017 0931   MCV 79 (L) 02/21/2012 0947   MCH 24.5 (L) 12/24/2017 0931   MCH 25.1 (L) 03/19/2016 1005   MCHC 31.1 (L) 12/24/2017 0931   MCHC 33.0 09/05/2017 0917   RDW 14.6 12/24/2017 0931   RDW 16.0 (H) 02/21/2012 0947   LYMPHSABS 2.5 12/24/2017 0931   MONOABS 0.6 09/05/2017 0917   EOSABS 0.3 12/24/2017 0931   BASOSABS 0.1 12/24/2017 0931   Iron/TIBC/Ferritin/ %Sat No results found for: IRON, TIBC, FERRITIN, IRONPCTSAT Lipid Panel     Component Value Date/Time   CHOL 205 (H) 12/24/2017 0931   TRIG 89 12/24/2017 0931   HDL 47 12/24/2017 0931   CHOLHDL 4 09/05/2017 0917   VLDL 19.0 09/05/2017 0917   LDLCALC 140 (H) 12/24/2017 0931   Hepatic Function Panel     Component Value Date/Time   PROT 6.5 12/24/2017 0931   PROT 6.5 02/21/2012 0947   ALBUMIN 4.3 12/24/2017 0931   ALBUMIN 3.2 (L) 02/21/2012 0947   AST 19 12/24/2017 0931   AST 22 02/21/2012 0947   ALT 24 12/24/2017 0931   ALT 30 02/21/2012 0947   ALKPHOS 69 12/24/2017 0931   ALKPHOS 89 02/21/2012 0947   BILITOT 0.5 12/24/2017 0931   BILITOT 1.1 (H) 02/21/2012 0947      Component Value Date/Time   TSH 2.270 12/24/2017 0931   TSH 1.91 09/05/2017 0917    Ref. Range 12/24/2017 09:31  Vitamin D, 25-Hydroxy Latest Ref Range: 30.0 - 100.0 ng/mL 47.0   ASSESSMENT AND PLAN: Prediabetes  Essential hypertension  Class 3 severe obesity with serious comorbidity and body mass index (BMI) greater than or equal to 70 in adult, unspecified obesity type Belinda Garrett)  PLAN:  Pre-Diabetes Belinda Garrett will continue to work on weight loss, exercise, and decreasing  simple carbohydrates in her diet to help decrease the risk of diabetes. We dicussed metformin including benefits and risks. She was informed that eating too many simple carbohydrates or too many calories at one sitting increases the likelihood of GI side effects. Belinda Garrett will continue metformin for now and a prescription was not written today. Belinda Garrett agreed to follow up with [name] as directed to monitor her progress.  Hypertension We discussed sodium restriction, working on healthy weight loss, and a regular exercise program as the means to achieve improved blood pressure control. [place] agreed with this plan and agreed to follow up as directed. We will continue to monitor her blood pressure as well as her progress with the above lifestyle modifications. She will continue her medications as prescribed and will watch for signs of hypotension as she continues her lifestyle modifications.  I spent > than 50% of the 15 minute visit on counseling as documented in the note.  Obesity Belinda Garrett is currently in the action stage of change. As such, her goal is to continue with weight loss efforts She has agreed to follow the Category 4 plan Belinda Garrett has been instructed to work up to a goal of 150 minutes of combined cardio and strengthening exercise per week for weight loss and overall health benefits. We discussed the following Behavioral Modification Strategies today: increase H2O intake, no skipping meals, increasing lean protein intake, decreasing simple carbohydrates, increasing vegetables and work on meal planning and easy cooking plans Belinda Garrett will get certain foods out of the house.  Belinda Garrett has agreed to follow up with our clinic in 2 weeks. She was informed of the importance of frequent follow up visits  to maximize her success with intensive lifestyle modifications for her multiple health conditions.   OBESITY BEHAVIORAL INTERVENTION VISIT  Today's visit was # 5   Starting weight: 454 lbs Starting date:  12/24/2017 Today's weight : 437 lbs Today's date: 02/27/2018 Total lbs lost to date: 17   ASK: We discussed the diagnosis of obesity with Belinda Garrett today and Belinda Garrett agreed to give [name] permission to discuss obesity behavioral modification therapy today.  ASSESS: Belinda Garrett has the diagnosis of obesity and her BMI today is 82.61 Belinda Garrett is in the action stage of change   ADVISE: Belinda Garrett was educated on the multiple health risks of obesity as well as the benefit of weight loss to improve her health. She was advised of the need for long term treatment and the importance of lifestyle modifications to improve her current health and to decrease her risk of future health problems.  AGREE: Multiple dietary modification options and treatment options were discussed and  Belinda Garrett agreed to follow the recommendations documented in the above note.  ARRANGE: Belinda Garrett was educated on the importance of frequent visits to treat obesity as outlined per CMS and USPSTF guidelines and agreed to schedule her next follow up appointment today.  Corey Skains, am acting as Location manager for [employer]. Owens Shark, DO  sign

## 2018-03-15 ENCOUNTER — Encounter (INDEPENDENT_AMBULATORY_CARE_PROVIDER_SITE_OTHER): Payer: Self-pay | Admitting: Bariatrics

## 2018-03-15 ENCOUNTER — Encounter: Payer: Self-pay | Admitting: Internal Medicine

## 2018-03-15 ENCOUNTER — Other Ambulatory Visit: Payer: Self-pay | Admitting: Internal Medicine

## 2018-03-18 DIAGNOSIS — H524 Presbyopia: Secondary | ICD-10-CM | POA: Diagnosis not present

## 2018-03-19 ENCOUNTER — Encounter: Payer: Self-pay | Admitting: Internal Medicine

## 2018-03-19 DIAGNOSIS — I10 Essential (primary) hypertension: Secondary | ICD-10-CM

## 2018-03-21 ENCOUNTER — Other Ambulatory Visit: Payer: Self-pay | Admitting: Internal Medicine

## 2018-03-21 DIAGNOSIS — I10 Essential (primary) hypertension: Secondary | ICD-10-CM

## 2018-03-21 MED ORDER — LISINOPRIL 30 MG PO TABS: 30.0000 mg | ORAL_TABLET | Freq: Every day | ORAL | 3 refills | Status: DC

## 2018-03-21 MED ORDER — HYDROCHLOROTHIAZIDE 25 MG PO TABS: 25.0000 mg | ORAL_TABLET | Freq: Every day | ORAL | 3 refills | Status: DC

## 2018-03-21 MED ORDER — LISINOPRIL 30 MG PO TABS: 30.0000 mg | ORAL_TABLET | Freq: Every day | ORAL | 1 refills | Status: DC

## 2018-03-25 ENCOUNTER — Encounter (INDEPENDENT_AMBULATORY_CARE_PROVIDER_SITE_OTHER): Payer: Self-pay | Admitting: Bariatrics

## 2018-03-25 ENCOUNTER — Ambulatory Visit (INDEPENDENT_AMBULATORY_CARE_PROVIDER_SITE_OTHER): Payer: No Typology Code available for payment source | Admitting: Bariatrics

## 2018-03-25 VITALS — BP 144/71 | HR 79 | Temp 98.4°F | Ht 61.0 in | Wt >= 6400 oz

## 2018-03-25 DIAGNOSIS — I10 Essential (primary) hypertension: Secondary | ICD-10-CM

## 2018-03-25 DIAGNOSIS — R142 Eructation: Secondary | ICD-10-CM | POA: Diagnosis not present

## 2018-03-25 DIAGNOSIS — Z9189 Other specified personal risk factors, not elsewhere classified: Secondary | ICD-10-CM

## 2018-03-25 DIAGNOSIS — Z6841 Body Mass Index (BMI) 40.0 and over, adult: Secondary | ICD-10-CM

## 2018-03-25 DIAGNOSIS — R7303 Prediabetes: Secondary | ICD-10-CM | POA: Diagnosis not present

## 2018-03-25 NOTE — Progress Notes (Signed)
Office: [phone]  /  Fax: [phone]   HPI:   Chief Complaint: OBESITY Belinda Garrett is here to discuss her progress with her obesity treatment plan. She is on the Category 4 plan and is following her eating plan approximately 0 % of the time. She states she is exercising 0 minutes 0 times per week. Belinda Garrett has struggled since her last visit. She wants to get back on track. Her weight is (!) 450 lb (204.1 kg) today and has had a weight gain of 13 pounds over a period of 3 weeks since her last visit. She has lost 4 lbs since starting treatment with [name].  Pre-Diabetes Belinda Garrett has a diagnosis of prediabetes based on her elevated Hgb A1c and was informed this puts her at greater risk of developing diabetes. She is taking metformin currently and continues to work on diet and exercise to decrease risk of diabetes. She denies polyphagia or hypoglycemia.  At risk for diabetes Belinda Garrett is at higher than average risk for developing diabetes due to her obesity and prediabetes. She currently denies polyuria or polydipsia.  Hypertension Belinda Garrett is a 48 y.o. female with hypertension.  Belinda Garrett denies chest pain or shortness of breath on exertion. She is working weight loss to help control her blood pressure with the goal of decreasing her risk of heart attack and stroke. Belinda Garrett blood pressure is reasonably well controlled.  Belching Belinda Garrett has increased belching and she denies heartburn or abdominal pain.  ASSESSMENT AND PLAN:  Prediabetes  Essential hypertension  Belching  At risk for diabetes mellitus  Class 3 severe obesity with serious comorbidity and body mass index (BMI) greater than or equal to 70 in adult, unspecified obesity type Belinda Garrett)  PLAN:  Pre-Diabetes Belinda Garrett will continue to work on weight loss, exercise, increasing lean protein and decreasing simple carbohydrates in her diet to help decrease the risk of diabetes. We dicussed metformin including benefits and risks.  She was informed that eating too many simple carbohydrates or too many calories at one sitting increases the likelihood of GI side effects. Belinda Garrett will continue metformin for now and a prescription was not written today. Belinda Garrett agreed to follow up with [name] as directed to monitor her progress.  Diabetes risk counseling Belinda Garrett was given extended (15 minutes) diabetes prevention counseling today. She is 48 y.o. female and has risk factors for diabetes including obesity and prediabetes. We discussed intensive lifestyle modifications today with an emphasis on weight loss as well as increasing exercise and decreasing simple carbohydrates in her diet.  Hypertension We discussed sodium restriction, working on healthy weight loss, and a regular exercise program as the means to achieve improved blood pressure control. Belinda Garrett agreed with this plan and agreed to follow up as directed. We will continue to monitor her blood pressure as well as her progress with the above lifestyle modifications. She will continue her medications as prescribed and will watch for signs of hypotension as she continues her lifestyle modifications.  Belching Belinda Garrett agrees to take Gas X or Beano for flatus or Alka Seltzer chews. Belinda Garrett agrees to follow up as directed.  Obesity Belinda Garrett is currently in the action stage of change. As such, her goal is to continue with weight loss efforts She has agreed to follow the Category 4 plan Belinda Garrett has been instructed to get up and walk several times a day for weight loss and overall health benefits. We discussed the following Behavioral Modification Strategies today: increase H2O intake, no skipping meals, increasing  lean protein intake, decreasing simple carbohydrates and increasing vegetables Belinda Garrett will clean out all of the "bad food". She will work on meal prep and planning.  Belinda Garrett has agreed to follow up with our clinic in 2 weeks. She was informed of the importance of frequent follow up  visits to maximize her success with intensive lifestyle modifications for her multiple health conditions.  ALLERGIES: Allergies  Allergen Reactions  . Dust Mite Extract   . Latex   . Pollen Extract     MEDICATIONS: Current Outpatient Medications on File Prior to Visit  Medication Sig Dispense Refill  . Cyanocobalamin (VITAMIN B-12 PO) Take 2,500 mcg by mouth daily.     [name] FLUoxetine (PROZAC) 20 MG tablet Take 2 tablets (40 mg total) by mouth daily. 180 tablet 3  . glucosamine-chondroitin 500-400 MG tablet Take 1 tablet by mouth 3 (three) times daily.    . hydrochlorothiazide (HYDRODIURIL) 25 MG tablet Take 1 tablet (25 mg total) by mouth daily. In am 90 tablet 3  . lisinopril (PRINIVIL,ZESTRIL) 30 MG tablet Take 1 tablet (30 mg total) by mouth daily. 90 tablet 3  . loratadine (CLARITIN) 10 MG tablet Take 10 mg by mouth daily.    . medroxyPROGESTERone (PROVERA) 2.5 MG tablet Take 2.5 mg by mouth daily.     . metFORMIN (GLUCOPHAGE) 500 MG tablet daily with food    . Multiple Vitamin (MULTIVITAMIN) capsule Take 1 capsule by mouth daily.    . Naproxen Sodium (ALEVE) 220 MG CAPS Take 440 mg by mouth daily.    . Omega-3 Fatty Acids (FISH OIL) 1000 MG CAPS Take 1,000 mg by mouth daily.     No current facility-administered medications on file prior to visit.     PAST MEDICAL HISTORY: Past Medical History:  Diagnosis Date  . Abnormal menses   . Allergy   . Arthritis   . Back pain   . Chicken pox   . Depression   . Diabetes mellitus without complication ([place])   . Hypertension   . Joint pain   . Lymphedema   . Palpitations   . Prediabetes   . Seasonal allergies   . Sleep apnea   . Stasis dermatitis of both legs     PAST SURGICAL HISTORY: Past Surgical History:  Procedure Laterality Date  . WISDOM TOOTH EXTRACTION      SOCIAL HISTORY: Social History   Tobacco Use  . Smoking status: Never Smoker  . Smokeless tobacco: Never Used  Substance Use Topics  . Alcohol use: Not  Currently    Alcohol/week: 0.0 standard drinks  . Drug use: Not Currently    FAMILY HISTORY: Family History  Problem Relation Age of Onset  . Arthritis Mother   . Depression Mother   . Hypertension Mother   . Thyroid disease Mother   . Obesity Mother   . Depression Father   . Parkinson's disease Father   . Dementia Father   . Obesity Father   . Arthritis Maternal Grandmother   . Heart disease Maternal Grandmother   . Hypertension Maternal Grandmother   . Hyperlipidemia Maternal Grandmother   . Stroke Maternal Grandmother   . Alcohol abuse Maternal Grandfather   . Arthritis Maternal Grandfather   . Depression Maternal Grandfather   . Hearing loss Maternal Grandfather   . Heart disease Maternal Grandfather   . Stroke Maternal Grandfather   . Arthritis Paternal Grandmother   . Arthritis Paternal Grandfather   . Depression Paternal Grandfather   . Heart  disease Paternal Grandfather   . Hypertension Paternal Grandfather   . Stroke Paternal Grandfather   . Breast cancer Neg Hx     ROS: Review of Systems  Constitutional: Negative for weight loss.  Respiratory: Negative for shortness of breath (on exertion).   Cardiovascular: Negative for chest pain.  Gastrointestinal: Negative for abdominal pain and heartburn.       Positive for belching  Genitourinary: Negative for frequency.  Endo/Heme/Allergies: Negative for polydipsia.       Negative for polyphagia Negative for hypoglycemia    PHYSICAL EXAM: Blood pressure (!) 144/71, pulse 79, temperature 98.4 F (36.9 C), height 5\' 1"  (1.549 m), weight (!) 450 lb (204.1 kg), SpO2 99 %. Body mass index is 85.03 kg/m. Physical Exam Vitals signs reviewed.  Constitutional:      Appearance: Normal appearance. She is well-developed. She is obese.  Cardiovascular:     Rate and Rhythm: Normal rate.  Pulmonary:     Effort: Pulmonary effort is normal.  Musculoskeletal: Normal range of motion.  Skin:    General: Skin is warm and  dry.  Neurological:     Mental Status: She is alert and oriented to person, place, and time.  Psychiatric:        Mood and Affect: Mood normal.        Behavior: Behavior normal.     RECENT LABS AND TESTS: BMET    Component Value Date/Time   NA 140 12/24/2017 0931   NA 141 02/21/2012 0947   K 4.9 12/24/2017 0931   K 3.7 02/21/2012 0947   CL 101 12/24/2017 0931   CL 110 (H) 02/21/2012 0947   CO2 23 12/24/2017 0931   CO2 25 02/21/2012 0947   GLUCOSE 102 (H) 12/24/2017 0931   GLUCOSE 101 (H) 09/05/2017 0917   GLUCOSE 145 (H) 02/21/2012 0947   BUN 16 12/24/2017 0931   BUN 13 02/21/2012 0947   CREATININE 0.91 12/24/2017 0931   CREATININE 0.69 02/21/2012 0947   CALCIUM 9.5 12/24/2017 0931   CALCIUM 8.4 (L) 02/21/2012 0947   GFRNONAA 75 12/24/2017 0931   GFRNONAA >60 02/21/2012 0947   GFRAA 86 12/24/2017 0931   GFRAA >60 02/21/2012 0947   Lab Results  Component Value Date   HGBA1C 5.8 (H) 12/24/2017   HGBA1C 5.9 09/05/2017   Lab Results  Component Value Date   INSULIN 37.1 (H) 12/24/2017   CBC    Component Value Date/Time   WBC 11.6 (H) 12/24/2017 0931   WBC 12.1 (H) 09/05/2017 0917   RBC 4.93 12/24/2017 0931   RBC 4.96 09/05/2017 0917   HGB 12.1 12/24/2017 0931   HCT 38.9 12/24/2017 0931   PLT 262.0 09/05/2017 0917   PLT 222 02/21/2012 0947   MCV 79 12/24/2017 0931   MCV 79 (L) 02/21/2012 0947   MCH 24.5 (L) 12/24/2017 0931   MCH 25.1 (L) 03/19/2016 1005   MCHC 31.1 (L) 12/24/2017 0931   MCHC 33.0 09/05/2017 0917   RDW 14.6 12/24/2017 0931   RDW 16.0 (H) 02/21/2012 0947   LYMPHSABS 2.5 12/24/2017 0931   MONOABS 0.6 09/05/2017 0917   EOSABS 0.3 12/24/2017 0931   BASOSABS 0.1 12/24/2017 0931   Iron/TIBC/Ferritin/ %Sat No results found for: IRON, TIBC, FERRITIN, IRONPCTSAT Lipid Panel     Component Value Date/Time   CHOL 205 (H) 12/24/2017 0931   TRIG 89 12/24/2017 0931   HDL 47 12/24/2017 0931   CHOLHDL 4 09/05/2017 0917   VLDL 19.0 09/05/2017 0917  LDLCALC 140 (H) 12/24/2017 0931   Hepatic Function Panel     Component Value Date/Time   PROT 6.5 12/24/2017 0931   PROT 6.5 02/21/2012 0947   ALBUMIN 4.3 12/24/2017 0931   ALBUMIN 3.2 (L) 02/21/2012 0947   AST 19 12/24/2017 0931   AST 22 02/21/2012 0947   ALT 24 12/24/2017 0931   ALT 30 02/21/2012 0947   ALKPHOS 69 12/24/2017 0931   ALKPHOS 89 02/21/2012 0947   BILITOT 0.5 12/24/2017 0931   BILITOT 1.1 (H) 02/21/2012 0947      Component Value Date/Time   TSH 2.270 12/24/2017 0931   TSH 1.91 09/05/2017 0917   Results for LEILANI, CESPEDES "ANGIE" (MRN [id number]) as of 03/25/2018 09:18  Ref. Range 12/24/2017 09:31  Vitamin D, 25-Hydroxy Latest Ref Range: 30.0 - 100.0 ng/mL 47.0     OBESITY BEHAVIORAL INTERVENTION VISIT  Today's visit was # 6   Starting weight: 454 lbs Starting date: 12/24/2017 Today's weight : 450 lbs Today's date: 03/25/2018 Total lbs lost to date: 4   ASK: We discussed the diagnosis of obesity with Belinda Garrett today and Belinda Garrett agreed to give [name] permission to discuss obesity behavioral modification therapy today.  ASSESS: Belinda Garrett has the diagnosis of obesity and her BMI today is 85.07 Belinda Garrett is in the action stage of change   ADVISE: Prudie was educated on the multiple health risks of obesity as well as the benefit of weight loss to improve her health. She was advised of the need for long term treatment and the importance of lifestyle modifications to improve her current health and to decrease her risk of future health problems.  AGREE: Multiple dietary modification options and treatment options were discussed and  Belinda Garrett agreed to follow the recommendations documented in the above note.  ARRANGE: Belinda Garrett was educated on the importance of frequent visits to treat obesity as outlined per CMS and USPSTF guidelines and agreed to schedule her next follow up appointment today.  Corey Skains, am acting as Location manager for [employer]. Owens Shark,  DO  I have reviewed the above documentation for accuracy and completeness, and I agree with the above. -Jearld Lesch, DO

## 2018-04-08 ENCOUNTER — Ambulatory Visit (INDEPENDENT_AMBULATORY_CARE_PROVIDER_SITE_OTHER): Payer: No Typology Code available for payment source | Admitting: Bariatrics

## 2018-04-18 ENCOUNTER — Ambulatory Visit (INDEPENDENT_AMBULATORY_CARE_PROVIDER_SITE_OTHER): Payer: No Typology Code available for payment source | Admitting: Bariatrics

## 2018-04-29 MED FILL — FLUoxetine HCL 20 MG CAPS: 20 | 90 days supply | Qty: 180 | Fill #0

## 2018-04-29 MED FILL — MEDROXYPROGESTERONE 2.5 MG: 2.5 | 30 days supply | Qty: 30 | Fill #0

## 2018-04-29 MED FILL — SHIPPING COST: 1 days supply | Qty: 1 | Fill #0

## 2018-05-07 ENCOUNTER — Ambulatory Visit
Admission: RE | Admit: 2018-05-07 | Discharge: 2018-05-07 | Disposition: A | Discharge status: Home / Self Care | Payer: No Typology Code available for payment source | Source: Ambulatory Visit | Attending: Internal Medicine | Admitting: Internal Medicine

## 2018-05-07 ENCOUNTER — Ambulatory Visit
Admission: RE | Admit: 2018-05-07 | Discharge: 2018-05-07 | Disposition: A | Discharge status: Home / Self Care | Payer: No Typology Code available for payment source | Attending: Internal Medicine | Admitting: Internal Medicine

## 2018-05-07 DIAGNOSIS — G8929 Other chronic pain: Secondary | ICD-10-CM | POA: Insufficient documentation

## 2018-05-07 DIAGNOSIS — M545 Low back pain: Secondary | ICD-10-CM | POA: Diagnosis not present

## 2018-05-08 ENCOUNTER — Encounter: Payer: Self-pay | Admitting: Internal Medicine

## 2018-05-10 ENCOUNTER — Other Ambulatory Visit: Payer: Self-pay | Admitting: Internal Medicine

## 2018-05-10 DIAGNOSIS — M5441 Lumbago with sciatica, right side: Secondary | ICD-10-CM

## 2018-05-10 DIAGNOSIS — G629 Polyneuropathy, unspecified: Secondary | ICD-10-CM

## 2018-05-10 DIAGNOSIS — M5442 Lumbago with sciatica, left side: Secondary | ICD-10-CM

## 2018-05-17 ENCOUNTER — Ambulatory Visit
Admission: RE | Admit: 2018-05-17 | Discharge: 2018-05-17 | Disposition: A | Discharge status: Home / Self Care | Payer: No Typology Code available for payment source | Source: Ambulatory Visit | Attending: Internal Medicine | Admitting: Internal Medicine

## 2018-05-17 DIAGNOSIS — M5442 Lumbago with sciatica, left side: Secondary | ICD-10-CM

## 2018-05-17 DIAGNOSIS — M5441 Lumbago with sciatica, right side: Secondary | ICD-10-CM | POA: Diagnosis present

## 2018-05-17 DIAGNOSIS — G629 Polyneuropathy, unspecified: Secondary | ICD-10-CM

## 2018-06-06 MED FILL — LISINOPRIL 30 MG TABLET: 30 | 90 days supply | Qty: 90 | Fill #0

## 2018-06-06 MED FILL — MEDROXYPROGESTERONE 2.5 MG: 2.5 | 30 days supply | Qty: 30 | Fill #0

## 2018-06-06 MED FILL — HYDROCHLOROTHIAZIDE 25 MG T: 25 | 90 days supply | Qty: 90 | Fill #0

## 2018-06-19 ENCOUNTER — Telehealth: Payer: Self-pay

## 2018-06-19 NOTE — Telephone Encounter (Signed)
Copied from [place] [phone]. Topic: General - Inquiry >> Jun 19, 2018  2:59 PM Margot Ables wrote: Reason for CRM: Pt left msg on [facility] General VM asking if upcoming OV can be done as a virtual visit. Please advise.

## 2018-06-20 NOTE — Telephone Encounter (Signed)
mychart sent to ask for patients current email and to inform we can do virtual visit next appointment

## 2018-07-03 ENCOUNTER — Encounter: Payer: Self-pay | Admitting: Internal Medicine

## 2018-07-11 ENCOUNTER — Ambulatory Visit (INDEPENDENT_AMBULATORY_CARE_PROVIDER_SITE_OTHER): Payer: No Typology Code available for payment source | Admitting: Internal Medicine

## 2018-07-11 DIAGNOSIS — F419 Anxiety disorder, unspecified: Secondary | ICD-10-CM | POA: Insufficient documentation

## 2018-07-11 DIAGNOSIS — Z6841 Body Mass Index (BMI) 40.0 and over, adult: Secondary | ICD-10-CM

## 2018-07-11 DIAGNOSIS — Z1283 Encounter for screening for malignant neoplasm of skin: Secondary | ICD-10-CM

## 2018-07-11 DIAGNOSIS — I10 Essential (primary) hypertension: Secondary | ICD-10-CM

## 2018-07-11 DIAGNOSIS — R937 Abnormal findings on diagnostic imaging of other parts of musculoskeletal system: Secondary | ICD-10-CM

## 2018-07-11 DIAGNOSIS — L918 Other hypertrophic disorders of the skin: Secondary | ICD-10-CM | POA: Diagnosis not present

## 2018-07-11 DIAGNOSIS — F32A Depression, unspecified: Secondary | ICD-10-CM | POA: Insufficient documentation

## 2018-07-11 DIAGNOSIS — D229 Melanocytic nevi, unspecified: Secondary | ICD-10-CM

## 2018-07-11 DIAGNOSIS — D72829 Elevated white blood cell count, unspecified: Secondary | ICD-10-CM

## 2018-07-11 DIAGNOSIS — F329 Major depressive disorder, single episode, unspecified: Secondary | ICD-10-CM

## 2018-07-11 DIAGNOSIS — E119 Type 2 diabetes mellitus without complications: Secondary | ICD-10-CM

## 2018-07-11 DIAGNOSIS — F39 Unspecified mood [affective] disorder: Secondary | ICD-10-CM | POA: Insufficient documentation

## 2018-07-11 DIAGNOSIS — R200 Anesthesia of skin: Secondary | ICD-10-CM

## 2018-07-11 DIAGNOSIS — Z1231 Encounter for screening mammogram for malignant neoplasm of breast: Secondary | ICD-10-CM

## 2018-07-11 NOTE — Progress Notes (Signed)
Virtual Visit via Video Note Doxy  I connected with Belinda Garrett   on 07/11/18 at  8:15 AM EDT by a video enabled telemedicine application and verified that I am speaking with the correct person using two identifiers.  Location patient: home Location provider:work  Persons participating in the virtual visit: patient, provider  I discussed the limitations of evaluation and management by telemedicine and the availability of in person appointments. The patient expressed understanding and agreed to proceed.   HPI: 1. Morbid obesity she is exercising doing weight/resistance training at home with mom via virtually each week wt is down to 448.2 lbs was 432 but after holidays increase  2. HTN not checking BP on lis 20 hctz 25 mg qd no h/a, chest pain or sob  3. Chronic low back pain reviewed CT scan 05/17/18 and numbness in toes in 04/2018 she declines [facility] appt at that time but will reconsider in Fall 2020 for NCS/EMG Back pain does not limit activities and has ergonomic chair to work from home which has helped.   A. Severe L4-5 facet arthrosis with evidence of instability including anterolisthesis which is greater on the comparison radiographs than on this supine CT. Resultant severe spinal and left neural foraminal stenosis. B Grade 1 anterolisthesis of L5 on S1 with bilateral facet ankylosis. Moderate right and moderate to severe left neural foraminal stenosis. C. 2.5 cm right adrenal adenoma. D. Bilateral SI joint degenerative changes. 4. DM 2 A1C 5.8 12/24/17 on metformin 500 mg qd no refills needed   5. DUB wants to know if can stop Provera Rx by Dr. [facility]un[facility]y [facility]el at [facility] will cc him the note 6. Wants referral to Dr. Evorn Gong skin check and also mole/tag removal  7. Depression mood is good on prozac 80 mg qd   ROS: See pertinent positives and negatives per HPI.  Past Medical History:  Diagnosis Date  . Abnormal menses   . Allergy   . Arthritis   . Back pain   . Chicken pox   .  Depression   . Diabetes mellitus without complication ([place])   . Hypertension   . Joint pain   . Lymphedema   . Palpitations   . Prediabetes   . Seasonal allergies   . Sleep apnea   . Stasis dermatitis of both legs     Past Surgical History:  Procedure Laterality Date  . WISDOM TOOTH EXTRACTION      Family History  Problem Relation Age of Onset  . Arthritis Mother   . Depression Mother   . Hypertension Mother   . Thyroid disease Mother   . Obesity Mother   . Depression Father   . Parkinson's disease Father   . Dementia Father   . Obesity Father   . Arthritis Maternal Grandmother   . Heart disease Maternal Grandmother   . Hypertension Maternal Grandmother   . Hyperlipidemia Maternal Grandmother   . Stroke Maternal Grandmother   . Alcohol abuse Maternal Grandfather   . Arthritis Maternal Grandfather   . Depression Maternal Grandfather   . Hearing loss Maternal Grandfather   . Heart disease Maternal Grandfather   . Stroke Maternal Grandfather   . Arthritis Paternal Grandmother   . Arthritis Paternal Grandfather   . Depression Paternal Grandfather   . Heart disease Paternal Grandfather   . Hypertension Paternal Grandfather   . Stroke Paternal Grandfather   . Breast cancer Neg Hx     SOCIAL HX: works from home lives alone  Current Outpatient Medications:  .  Cyanocobalamin (VITAMIN B-12 PO), Take 2,500 mcg by mouth daily. , Disp: , Rfl:  .  FLUoxetine (PROZAC) 20 MG tablet, Take 2 tablets (40 mg total) by mouth daily., Disp: 180 tablet, Rfl: 3 .  glucosamine-chondroitin 500-400 MG tablet, Take 1 tablet by mouth 3 (three) times daily., Disp: , Rfl:  .  hydrochlorothiazide (HYDRODIURIL) 25 MG tablet, Take 1 tablet (25 mg total) by mouth daily. In am, Disp: 90 tablet, Rfl: 3 .  lisinopril (PRINIVIL,ZESTRIL) 30 MG tablet, Take 1 tablet (30 mg total) by mouth daily., Disp: 90 tablet, Rfl: 3 .  loratadine (CLARITIN) 10 MG tablet, Take 10 mg by mouth daily., Disp: , Rfl:   .  medroxyPROGESTERone (PROVERA) 2.5 MG tablet, Take 2.5 mg by mouth daily. , Disp: , Rfl:  .  metFORMIN (GLUCOPHAGE) 500 MG tablet, daily with food, Disp: , Rfl:  .  Multiple Vitamin (MULTIVITAMIN) capsule, Take 1 capsule by mouth daily., Disp: , Rfl:  .  Naproxen Sodium (ALEVE) 220 MG CAPS, Take 440 mg by mouth daily., Disp: , Rfl:  .  Omega-3 Fatty Acids (FISH OIL) 1000 MG CAPS, Take 1,000 mg by mouth daily., Disp: , Rfl:   EXAM:  VITALS per patient if applicable:  GENERAL: alert, oriented, appears well and in no acute distress  HEENT: atraumatic, conjunttiva clear, no obvious abnormalities on inspection of external nose and ears  NECK: normal movements of the head and neck  LUNGS: on inspection no signs of respiratory distress, breathing rate appears normal, no obvious gross SOB, gasping or wheezing  CV: no obvious cyanosis  MS: moves all visible extremities without noticeable abnormality  PSYCH/NEURO: pleasant and cooperative, no obvious depression or anxiety, speech and thought processing grossly intact  ASSESSMENT AND PLAN:  Discussed the following assessment and plan:  Essential hypertension - Plan:  -sch fasting labs 09/2018  -cont meds   Type 2 diabetes mellitus without complication, without long-term current use of insulin (HCC) - Plan:  sch fasting labs   Depression, unspecified depression type-cont meds controlled   Morbid obesity (HCC)-congratulated weight loss cont. [facility]   Numbness of toes likely related Abnormal CT scan, lumbar spine 05/17/2018 -pt will consider NCS/EMG with [facility] in the fall 2020 to confirm etiology from back  Disc alpha lipoic acid otc   Leukocytosis, unspecified type - Plan: CBC w/Diff -repeat labs 09/2018 and f/u after labs  HM Flu shot utd  UTD Tdap  rec MMR and hep B vaccine pt to think about  Declines STD testing  Pap 02/02/16 neg neg HPV f/u [facility] Dr. Chauncey Cruel   6/28/19mammogramnegreferred today for another  Derm is Dr. Brien Mates today   Neurology pt will consider in fall 2020 see above note  Consider H/O in futureif WBC remains elevated    I discussed the assessment and treatment plan with the patient. The patient was provided an opportunity to ask questions and all were answered. The patient agreed with the plan and demonstrated an understanding of the instructions.   The patient was advised to call back or seek an in-person evaluation if the symptoms worsen or if the condition fails to improve as anticipated.  Time speng 25 minutes  Delorise Jackson, MD

## 2018-07-11 NOTE — Progress Notes (Signed)
Note has been faxed to provider

## 2018-07-17 ENCOUNTER — Encounter: Payer: Self-pay | Admitting: Internal Medicine

## 2018-07-18 ENCOUNTER — Encounter: Payer: Self-pay | Admitting: Internal Medicine

## 2018-07-22 MED FILL — MEDROXYPROGESTERONE 2.5 MG: 2.5 | 30 days supply | Qty: 30 | Fill #0

## 2018-07-22 MED FILL — FLUoxetine HCL 20 MG CAPS: 20 | 90 days supply | Qty: 180 | Fill #1

## 2018-09-10 MED FILL — MEDROXYPROGESTERONE 2.5 MG: 2.5 | 30 days supply | Qty: 30 | Fill #0

## 2018-09-10 MED FILL — LISINOPRIL 30 MG TABLET: 30 | 90 days supply | Qty: 90 | Fill #0

## 2018-09-10 MED FILL — HYDROCHLOROTHIAZIDE 25 MG T: 25 | 90 days supply | Qty: 90 | Fill #1

## 2018-09-24 ENCOUNTER — Encounter: Payer: Self-pay | Admitting: Internal Medicine

## 2018-09-24 ENCOUNTER — Other Ambulatory Visit: Payer: Self-pay | Admitting: Internal Medicine

## 2018-09-24 DIAGNOSIS — Z124 Encounter for screening for malignant neoplasm of cervix: Secondary | ICD-10-CM

## 2018-09-25 ENCOUNTER — Other Ambulatory Visit: Payer: Self-pay

## 2018-09-25 ENCOUNTER — Other Ambulatory Visit (INDEPENDENT_AMBULATORY_CARE_PROVIDER_SITE_OTHER): Payer: No Typology Code available for payment source

## 2018-09-25 DIAGNOSIS — D72829 Elevated white blood cell count, unspecified: Secondary | ICD-10-CM

## 2018-09-25 DIAGNOSIS — I10 Essential (primary) hypertension: Secondary | ICD-10-CM

## 2018-09-25 DIAGNOSIS — E119 Type 2 diabetes mellitus without complications: Secondary | ICD-10-CM | POA: Diagnosis not present

## 2018-09-25 LAB — COMPREHENSIVE METABOLIC PANEL
ALT: 18 U/L (ref 0–35)
AST: 14 U/L (ref 0–37)
Albumin: 4.1 g/dL (ref 3.5–5.2)
Alkaline Phosphatase: 59 U/L (ref 39–117)
BUN: 19 mg/dL (ref 6–23)
CO2: 26 mEq/L (ref 19–32)
Calcium: 8.7 mg/dL (ref 8.4–10.5)
Chloride: 101 mEq/L (ref 96–112)
Creatinine, Ser: 1 mg/dL (ref 0.40–1.20)
GFR: 58.79 mL/min — ABNORMAL LOW (ref >60.00)
Glucose, Bld: 97 mg/dL (ref 70–99)
Potassium: 3.9 mEq/L (ref 3.5–5.1)
Sodium: 138 mEq/L (ref 135–145)
Total Bilirubin: 1.2 mg/dL (ref 0.2–1.2)
Total Protein: 6.3 g/dL (ref 6.0–8.3)

## 2018-09-25 LAB — CBC WITH DIFFERENTIAL/PLATELET
Basophils Absolute: 0 10*3/uL (ref 0.0–0.1)
Basophils Relative: 0.4 % (ref 0.0–3.0)
Eosinophils Absolute: 0.1 10*3/uL (ref 0.0–0.7)
Eosinophils Relative: 1.8 % (ref 0.0–5.0)
HCT: 37.2 % (ref 36.0–46.0)
Hemoglobin: 12 g/dL (ref 12.0–15.0)
Lymphocytes Relative: 16.8 % (ref 12.0–46.0)
Lymphs Abs: 1.4 10*3/uL (ref 0.7–4.0)
MCHC: 32.4 g/dL (ref 30.0–36.0)
MCV: 81 fl (ref 78.0–100.0)
Monocytes Absolute: 0.7 10*3/uL (ref 0.1–1.0)
Monocytes Relative: 8.1 % (ref 3.0–12.0)
Neutro Abs: 6 10*3/uL (ref 1.4–7.7)
Neutrophils Relative %: 72.9 % (ref 43.0–77.0)
Platelets: 230 10*3/uL (ref 150.0–400.0)
RBC: 4.59 Mil/uL (ref 3.87–5.11)
RDW: 16.6 % — ABNORMAL HIGH (ref 11.5–15.5)
WBC: 8.2 10*3/uL (ref 4.0–10.5)

## 2018-09-25 LAB — HEMOGLOBIN A1C: Hgb A1c MFr Bld: 5.6 % (ref 4.6–6.5)

## 2018-09-25 LAB — LIPID PANEL
Cholesterol: 178 mg/dL (ref 0–200)
HDL: 43 mg/dL (ref >39.00)
LDL Cholesterol: 111 mg/dL — ABNORMAL HIGH (ref 0–99)
NonHDL: 135.26
Total CHOL/HDL Ratio: 4
Triglycerides: 122 mg/dL (ref 0.0–149.0)
VLDL: 24.4 mg/dL (ref 0.0–40.0)

## 2018-09-26 ENCOUNTER — Other Ambulatory Visit: Payer: Self-pay | Admitting: Internal Medicine

## 2018-09-26 ENCOUNTER — Encounter: Payer: Self-pay | Admitting: Internal Medicine

## 2018-09-26 DIAGNOSIS — R319 Hematuria, unspecified: Secondary | ICD-10-CM

## 2018-09-26 LAB — URINALYSIS, ROUTINE W REFLEX MICROSCOPIC
Bacteria, UA: NONE SEEN /HPF
Bilirubin Urine: NEGATIVE
Glucose, UA: NEGATIVE
Hyaline Cast: NONE SEEN /LPF
Ketones, ur: NEGATIVE
Nitrite: NEGATIVE
Protein, ur: NEGATIVE
Specific Gravity, Urine: 1.021 (ref 1.001–1.03)
pH: 5.5 (ref 5.0–8.0)

## 2018-09-26 LAB — MICROALBUMIN / CREATININE URINE RATIO
Creatinine, Urine: 142 mg/dL (ref 20–275)
Microalb Creat Ratio: 16 mcg/mg creat (ref <30)
Microalb, Ur: 2.3 mg/dL

## 2018-09-30 ENCOUNTER — Other Ambulatory Visit: Payer: Self-pay

## 2018-09-30 ENCOUNTER — Ambulatory Visit (INDEPENDENT_AMBULATORY_CARE_PROVIDER_SITE_OTHER): Payer: No Typology Code available for payment source | Admitting: Neurology

## 2018-09-30 ENCOUNTER — Encounter: Payer: Self-pay | Admitting: Neurology

## 2018-09-30 DIAGNOSIS — G63 Polyneuropathy in diseases classified elsewhere: Secondary | ICD-10-CM

## 2018-09-30 DIAGNOSIS — R202 Paresthesia of skin: Secondary | ICD-10-CM | POA: Diagnosis not present

## 2018-09-30 DIAGNOSIS — G629 Polyneuropathy, unspecified: Secondary | ICD-10-CM | POA: Insufficient documentation

## 2018-09-30 HISTORY — DX: Polyneuropathy, unspecified: G62.9

## 2018-09-30 NOTE — Progress Notes (Signed)
Please refer to EMG and nerve conduction procedure note.  

## 2018-09-30 NOTE — Procedures (Signed)
     HISTORY:  Belinda Garrett is a 48 year old patient with a history of morbid obesity who reports chronic low back pain, degenerative arthritis in the knees, and some bilateral foot numbness that has been present for about 1 year.  The patient is prediabetic.  She is being evaluated for possible neuropathy or a lumbosacral radiculopathy.  NERVE CONDUCTION STUDIES:  Nerve conduction studies performed on both lower extremities.  The distal motor latencies for the peroneal and posterior tibial nerves were normal bilaterally with low motor amplitudes seen for the right peroneal nerve and for the left peroneal nerve above and below the knee.  The motor amplitudes for the posterior tibial nerves were low bilaterally, no response was seen after stimulation at the popliteal fossa for these nerves on either side.  Slowing was seen for the right peroneal nerve below the knee only, and slowing was seen for the left peroneal nerve below the knee, absent above the knee.  The sural and peroneal sensory latencies were unobtainable bilaterally.  The F-wave latencies for the posterior tibial nerves were prolonged bilaterally, normal for the peroneal nerves bilaterally.  EMG STUDIES:  EMG study was performed on the left lower extremity:  The tibialis anterior muscle reveals 2 to 4K motor units with full recruitment. No fibrillations or positive waves were seen. The peroneus tertius muscle reveals 2 to 4K motor units with slightly reduced recruitment. No fibrillations or positive waves were seen. The medial gastrocnemius muscle reveals 1 to 3K motor units with full recruitment. No fibrillations or positive waves were seen. The vastus lateralis muscle reveals 2 to 4K motor units with full recruitment. No fibrillations or positive waves were seen. The iliopsoas muscle reveals 2 to 4K motor units with full recruitment. No fibrillations or positive waves were seen. The biceps femoris muscle (long head) reveals 2 to 4K  motor units with full recruitment. No fibrillations or positive waves were seen. The lumbosacral paraspinal muscles were tested at 3 levels, and revealed no abnormalities of insertional activity at all 3 levels tested. There was good relaxation.   IMPRESSION:  Nerve conduction studies done on both lower extremities shows findings consistent with a primarily axonal peripheral neuropathy of moderate severity.  EMG evaluation of the left lower extremity shows mild chronic distal neuropathic signs of denervation consistent with the diagnosis of peripheral neuropathy.  There is no evidence of an overlying lumbosacral radiculopathy on this evaluation.  Jill Alexanders MD 09/30/2018 12:18 PM  [facility] [address] [place], [place] [address]  Phone [phone] Fax [phone]

## 2018-10-02 NOTE — Progress Notes (Signed)
[place]    Nerve / Sites Muscle Latency Ref. Amplitude Ref. Rel Amp Segments Distance Velocity Ref. Area    ms ms mV mV %  cm m/s m/s mVms  R Peroneal - EDB     Ankle EDB 4.9 ?6.5 1.7 ?2.0 100 Ankle - EDB 9   6.0     Fib head EDB 11.7  2.3  136 Fib head - Ankle 27 40 ?44 7.8     Pop fossa EDB 13.2  3.6  153 Pop fossa - Fib head 8 53 ?44 13.0         Pop fossa - Ankle      L Peroneal - EDB     Ankle EDB 5.4 ?6.5 2.1 ?2.0 100 Ankle - EDB 9   6.1     Fib head EDB 12.7  0.4  21.1 Fib head - Ankle 27 37 ?44 1.1     Pop fossa EDB NR  NR  NR Pop fossa - Fib head 10 NR ?44 NR         Pop fossa - Ankle      R Tibial - AH     Ankle AH 4.7 ?5.8 2.7 ?4.0 100 Ankle - AH 9   6.7  L Tibial - AH     Ankle AH 4.8 ?5.8 0.8 ?4.0 100 Ankle - AH 9   4.7             SNC    Nerve / Sites Rec. Site Peak Lat Ref.  Amp Ref. Segments Distance    ms ms V V  cm  R Sural - Ankle (Calf)     Calf Ankle NR ?4.4 NR ?6 Calf - Ankle 14  L Sural - Ankle (Calf)     Calf Ankle NR ?4.4 NR ?6 Calf - Ankle 14  R Superficial peroneal - Ankle     Lat leg Ankle NR ?4.4 NR ?6 Lat leg - Ankle 14  L Superficial peroneal - Ankle     Lat leg Ankle NR ?4.4 NR ?6 Lat leg - Ankle 14                F  Wave    Nerve F Lat Ref.   ms ms  R Peroneal - EDB 51.2 ?56.0  R Tibial - AH 57.8 ?56.0  L Peroneal - EDB 51.9 ?56.0  L Tibial - AH 60.7 ?56.0

## 2018-10-03 MED FILL — metFORMIN HCL 500 MG TABS: 500 | 90 days supply | Qty: 270 | Fill #0

## 2018-10-05 ENCOUNTER — Encounter: Payer: Self-pay | Admitting: Internal Medicine

## 2018-10-07 ENCOUNTER — Telehealth: Payer: Self-pay

## 2018-10-07 NOTE — Telephone Encounter (Signed)
Coronavirus (COVID-19) Are you at risk?  Are you at risk for the Coronavirus (COVID-19)?  To be considered HIGH RISK for Coronavirus (COVID-19), you have to meet the following criteria:  . Traveled to [place], [place], [place], [place] or [place]; or in the [place] to [place], [place], [place], or [place]; and have fever, cough, and shortness of breath within the last 2 weeks of travel OR . Been in close contact with a person diagnosed with COVID-19 within the last 2 weeks and have fever, cough, and shortness of breath . IF YOU DO NOT MEET THESE CRITERIA, YOU ARE CONSIDERED LOW RISK FOR COVID-19.  What to do if you are HIGH RISK for COVID-19?  [name] If you are having a medical emergency, call 911. . Seek medical care right away. Before you go to a doctor's office, urgent care or emergency department, call ahead and tell them about your recent travel, contact with someone diagnosed with COVID-19, and your symptoms. You should receive instructions from your physician's office regarding next steps of care.  . When you arrive at healthcare provider, tell the healthcare staff immediately you have returned from visiting [place], [place], [place], [place] or [place]; or traveled in the [place] to [place], [place], [place], or [place]; in the last two weeks or you have been in close contact with a person diagnosed with COVID-19 in the last 2 weeks.   . Tell the health care staff about your symptoms: fever, cough and shortness of breath. . After you have been seen by a medical provider, you will be either: o Tested for (COVID-19) and discharged home on quarantine except to seek medical care if symptoms worsen, and asked to  - Stay home and avoid contact with others until you get your results (4-5 days)  - Avoid travel on public transportation if possible (such as bus, train, or airplane) or o Sent to the Emergency Department by EMS for evaluation, COVID-19 testing, and possible  admission depending on your condition and test results.  What to do if you are LOW RISK for COVID-19?  Reduce your risk of any infection by using the same precautions used for avoiding the common cold or flu:  [name] Wash your hands often with soap and warm water for at least 20 seconds.  If soap and water are not readily available, use an alcohol-based hand sanitizer with at least 60% alcohol.  . If coughing or sneezing, cover your mouth and nose by coughing or sneezing into the elbow areas of your shirt or coat, into a tissue or into your sleeve (not your hands). . Avoid shaking hands with others and consider head nods or verbal greetings only. . Avoid touching your eyes, nose, or mouth with unwashed hands.  . Avoid close contact with people who are Belinda Garrett. . Avoid places or events with large numbers of people in one location, like concerts or sporting events. . Carefully consider travel plans you have or are making. . If you are planning any travel outside or inside the [name], visit the CDC's Travelers' Health webpage for the latest health notices. . If you have some symptoms but not all symptoms, continue to monitor at home and seek medical attention if your symptoms worsen. . If you are having a medical emergency, call 911.  10/07/18 SCREENING NEG SLS ADDITIONAL HEALTHCARE OPTIONS FOR PATIENTS  [facility] / e-Visit: [email or web address]         [facility]: [phone]  [facility]: [phone]                   [facility]: [phone]  

## 2018-10-08 ENCOUNTER — Other Ambulatory Visit: Payer: Self-pay

## 2018-10-08 ENCOUNTER — Encounter: Payer: Self-pay | Admitting: Certified Nurse Midwife

## 2018-10-08 ENCOUNTER — Ambulatory Visit (INDEPENDENT_AMBULATORY_CARE_PROVIDER_SITE_OTHER): Payer: No Typology Code available for payment source | Admitting: Certified Nurse Midwife

## 2018-10-08 VITALS — BP 136/77 | HR 82 | Ht 61.0 in | Wt >= 6400 oz

## 2018-10-08 DIAGNOSIS — Z01419 Encounter for gynecological examination (general) (routine) without abnormal findings: Secondary | ICD-10-CM

## 2018-10-08 MED ORDER — MEDROXYPROGESTERONE ACETATE 2.5 MG PO TABS: 2.5000 mg | ORAL_TABLET | Freq: Every day | ORAL | 11 refills | Status: DC

## 2018-10-08 NOTE — Patient Instructions (Signed)

## 2018-10-08 NOTE — Progress Notes (Signed)
GYNECOLOGY ANNUAL PREVENTATIVE CARE ENCOUNTER NOTE  History:     Belinda Garrett is a 48 y.o. G0P0000 female here for a routine annual gynecologic exam.  Current complaints: heavy bleeding, she was on provera but stopped to see if she was menopause. Was interested in having procedure ( ablation or hysterectomy) to stop periods but due to BMI has not been able to have it done. .   Denies discharge, pelvic pain, or other gynecologic concerns.  She is a virgin     Body mass index is 83.93 kg/m.  Gynecologic History Patient's last menstrual period was 10/07/2018 (exact date). Contraception: provera  Last Pap: 2018 ( per pt) Results were: normal per pt ( ask for release of medical records)  Last mammogram: 09/14/17 Results were: normal  Obstetric History OB History  Gravida Para Term Preterm AB Living  0 0 0 0 0 0  SAB TAB Ectopic Multiple Live Births  0 0 0 0 0    Past Medical History:  Diagnosis Date  . Abnormal menses   . Allergy   . Arthritis   . Back pain   . Chicken pox   . Depression   . Diabetes mellitus without complication ([place])   . Hypertension   . Joint pain   . Lymphedema   . Palpitations   . Peripheral neuropathy 09/30/2018  . Prediabetes   . Seasonal allergies   . Sleep apnea   . Stasis dermatitis of both legs     Past Surgical History:  Procedure Laterality Date  . WISDOM TOOTH EXTRACTION      Current Outpatient Medications on File Prior to Visit  Medication Sig Dispense Refill  . Cholecalciferol (VITAMIN D3) 1.25 MG (50000 UT) CAPS     . FLUoxetine (PROZAC) 20 MG tablet Take 2 tablets (40 mg total) by mouth daily. 180 tablet 3  . glucosamine-chondroitin 500-400 MG tablet Take 1 tablet by mouth 3 (three) times daily.    . hydrochlorothiazide (HYDRODIURIL) 25 MG tablet Take 1 tablet (25 mg total) by mouth daily. In am 90 tablet 3  . lisinopril (PRINIVIL,ZESTRIL) 30 MG tablet Take 1 tablet (30 mg total) by mouth daily. 90 tablet 3  . loratadine  (CLARITIN) 10 MG tablet Take 10 mg by mouth daily.    . metFORMIN (GLUCOPHAGE) 500 MG tablet daily with food    . Multiple Vitamin (MULTIVITAMIN) capsule Take 1 capsule by mouth daily.    . Omega-3 Fatty Acids (FISH OIL) 1000 MG CAPS Take 1,000 mg by mouth daily.    . Cyanocobalamin (VITAMIN B-12 PO) Take 2,500 mcg by mouth daily.     . Naproxen Sodium (ALEVE) 220 MG CAPS Take 440 mg by mouth daily.     No current facility-administered medications on file prior to visit.     Allergies  Allergen Reactions  . Dust Mite Extract   . Latex   . Pollen Extract     Social History:  reports that she has never smoked. She has never used smokeless tobacco. She reports previous alcohol use. She reports previous drug use.  Family History  Problem Relation Age of Onset  . Arthritis Mother   . Depression Mother   . Hypertension Mother   . Thyroid disease Mother   . Obesity Mother   . Depression Father   . Parkinson's disease Father   . Dementia Father   . Obesity Father   . Arthritis Maternal Grandmother   . Heart disease Maternal Grandmother   .  Hypertension Maternal Grandmother   . Hyperlipidemia Maternal Grandmother   . Stroke Maternal Grandmother   . Alcohol abuse Maternal Grandfather   . Arthritis Maternal Grandfather   . Depression Maternal Grandfather   . Hearing loss Maternal Grandfather   . Heart disease Maternal Grandfather   . Stroke Maternal Grandfather   . Arthritis Paternal Grandmother   . Arthritis Paternal Grandfather   . Depression Paternal Grandfather   . Heart disease Paternal Grandfather   . Hypertension Paternal Grandfather   . Stroke Paternal Grandfather   . Breast cancer Neg Hx     The following portions of the patient's history were reviewed and updated as appropriate: allergies, current medications, past family history, past medical history, past social history, past surgical history and problem list.  Review of Systems Pertinent items noted in HPI and  remainder of comprehensive ROS otherwise negative.  Physical Exam:  BP 136/77   Pulse 82   Ht 5\' 1"  (1.549 m)   Wt (!) 444 lb 3 oz (201.5 kg)   LMP 10/07/2018 (Exact Date)   BMI 83.93 kg/m  CONSTITUTIONAL: Well-developed, well-nourished morbidly obese female in no acute distress.  HENT:  Normocephalic, atraumatic, External right and left ear normal. Oropharynx is clear and moist EYES: Conjunctivae and EOM are normal. Pupils are equal, round, and reactive to light. No scleral icterus.  NECK: Normal range of motion, supple, no masses.  Normal thyroid.  SKIN: Skin is warm and dry. Discoloration of lower extremities and swelling present. Not diaphoretic. No erythema. No pallor. MUSCULOSKELETAL: Normal range of motion. No tenderness.  No cyanosis, clubbing, or edema.  2+ distal pulses. NEUROLOGIC: Alert and oriented to person, place, and time. Normal reflexes, muscle tone coordination. No cranial nerve deficit noted. PSYCHIATRIC: Normal mood and affect. Normal behavior. Normal judgment and thought content. CARDIOVASCULAR: Normal heart rate noted, regular rhythm RESPIRATORY: Clear to auscultation bilaterally. Effort and breath sounds normal, no problems with respiration noted. BREASTS: Symmetric in size. No masses, skin changes, nipple drainage, or lymphadenopathy. Difficult to assess due to body habitus. ABDOMEN: Soft, normal bowel sounds, no distention noted.  No tenderness, rebound or guarding. difficlut to assess due to body habitus PELVIC: Normal appearing external genitalia; normal appearing vaginal mucosa and cervix.  No abnormal discharge noted.  Pap smear not indicated.  uterine size unable to feel due to body habitus, other palpable masses-unable to assess due to body habitus, no uterine or adnexal tenderness.   Assessment and Plan:  Annual [facility] GYN exam  Pap smear not due Mammogram scheduled by PCP Labs completed by PCP Refill on provera Routine preventative health  maintenance measures emphasized.Encouraged exercise and weight loss.  Please refer to After Visit Summary for other counseling recommendations.      Philip Aspen, CNM

## 2018-10-28 MED FILL — MEDROXYPROGESTERONE 2.5 MG: 2.5 | 90 days supply | Qty: 90 | Fill #0

## 2018-10-30 ENCOUNTER — Other Ambulatory Visit: Payer: Self-pay | Admitting: Internal Medicine

## 2018-10-30 DIAGNOSIS — F419 Anxiety disorder, unspecified: Secondary | ICD-10-CM

## 2018-10-30 DIAGNOSIS — F32A Depression, unspecified: Secondary | ICD-10-CM

## 2018-10-30 DIAGNOSIS — F329 Major depressive disorder, single episode, unspecified: Secondary | ICD-10-CM

## 2018-10-30 MED ORDER — FLUOXETINE HCL 20 MG PO TABS: 40.0000 mg | ORAL_TABLET | Freq: Every day | ORAL | 3 refills | Status: DC

## 2018-10-30 MED FILL — FLUoxetine HCL 20 MG CAPS: 20 | 90 days supply | Qty: 180 | Fill #0

## 2018-12-03 ENCOUNTER — Other Ambulatory Visit: Payer: Self-pay

## 2018-12-04 ENCOUNTER — Other Ambulatory Visit: Payer: Self-pay

## 2018-12-04 ENCOUNTER — Ambulatory Visit (INDEPENDENT_AMBULATORY_CARE_PROVIDER_SITE_OTHER): Payer: No Typology Code available for payment source | Admitting: Internal Medicine

## 2018-12-04 ENCOUNTER — Encounter: Payer: Self-pay | Admitting: Internal Medicine

## 2018-12-04 VITALS — BP 132/82 | HR 82 | Temp 98.8°F | Ht 61.0 in | Wt >= 6400 oz

## 2018-12-04 DIAGNOSIS — Z23 Encounter for immunization: Secondary | ICD-10-CM

## 2018-12-04 DIAGNOSIS — Z6841 Body Mass Index (BMI) 40.0 and over, adult: Secondary | ICD-10-CM

## 2018-12-04 DIAGNOSIS — E119 Type 2 diabetes mellitus without complications: Secondary | ICD-10-CM

## 2018-12-04 DIAGNOSIS — F32A Depression, unspecified: Secondary | ICD-10-CM

## 2018-12-04 DIAGNOSIS — F329 Major depressive disorder, single episode, unspecified: Secondary | ICD-10-CM

## 2018-12-04 DIAGNOSIS — I872 Venous insufficiency (chronic) (peripheral): Secondary | ICD-10-CM

## 2018-12-04 DIAGNOSIS — I10 Essential (primary) hypertension: Secondary | ICD-10-CM

## 2018-12-04 DIAGNOSIS — Z0000 Encounter for general adult medical examination without abnormal findings: Secondary | ICD-10-CM | POA: Diagnosis not present

## 2018-12-04 DIAGNOSIS — Z1329 Encounter for screening for other suspected endocrine disorder: Secondary | ICD-10-CM

## 2018-12-04 DIAGNOSIS — R319 Hematuria, unspecified: Secondary | ICD-10-CM

## 2018-12-04 DIAGNOSIS — R200 Anesthesia of skin: Secondary | ICD-10-CM

## 2018-12-04 MED ORDER — TRIAMCINOLONE ACETONIDE 0.1 % EX CREA: 1.0000 "application " | TOPICAL_CREAM | Freq: Two times a day (BID) | CUTANEOUS | 11 refills | Status: DC

## 2018-12-04 NOTE — Progress Notes (Addendum)
. Chief Complaint  Patient presents with  . Follow-up   F/u  1. DM 2 a1C 5.6 on metformin 500 mg qd she is exercising and trying to eat right wt stable has stopped f/u with wt loss clinic with [facility] for now she is doing chair exercises with mother for 25 min. Over the phone 3x per week advised to increase # minutes per week  2. DUB stopped after restaring provera and [facility] will be due for pap smear at next visit in 09/2019 actually due 01/2019  3. +EMG/NCS for neurology peripheral but not related to back issues saw dr. [name] Franklin will CC him to see what he recommends as her feet are numb at times also disc scrambler therapy in the future and supplement alpha lipoic acid 4. OA/joint pain taking aleve 2 daily advised pt due to rise in Cr and reduced GFR would limit use and try to do Tylenol prn but when she stopped NSAIDS pani is worse and for now she will try to reduce to 1 daily alevel  5. Redness and itching to b/l legs and sometimes blisters.  6. Last labs hematuria was on last day of cycle  7. annual   Review of Systems  Constitutional: Negative for weight loss.  HENT: Negative for hearing loss.   Eyes: Negative for blurred vision.  Respiratory: Negative for shortness of breath.   Cardiovascular: Negative for chest pain.  Gastrointestinal: Negative for abdominal pain.  Musculoskeletal: Positive for back pain and joint pain.  Skin: Positive for itching and rash.  Neurological: Positive for sensory change. Negative for headaches.       Numbness in feet b/l    Psychiatric/Behavioral: Negative for depression.   Past Medical History:  Diagnosis Date  . Abnormal menses   . Allergy   . Arthritis   . Back pain   . Chicken pox   . Depression   . Diabetes mellitus without complication ([place])   . Hypertension   . Joint pain   . Lymphedema   . Palpitations   . Peripheral neuropathy 09/30/2018  . Prediabetes   . Seasonal allergies   . Sleep apnea   . Stasis  dermatitis of both legs    Past Surgical History:  Procedure Laterality Date  . WISDOM TOOTH EXTRACTION     Family History  Problem Relation Age of Onset  . Arthritis Mother   . Depression Mother   . Hypertension Mother   . Thyroid disease Mother   . Obesity Mother   . Depression Father   . Parkinson's disease Father   . Dementia Father   . Obesity Father   . Arthritis Maternal Grandmother   . Heart disease Maternal Grandmother   . Hypertension Maternal Grandmother   . Hyperlipidemia Maternal Grandmother   . Stroke Maternal Grandmother   . Alcohol abuse Maternal Grandfather   . Arthritis Maternal Grandfather   . Depression Maternal Grandfather   . Hearing loss Maternal Grandfather   . Heart disease Maternal Grandfather   . Stroke Maternal Grandfather   . Arthritis Paternal Grandmother   . Arthritis Paternal Grandfather   . Depression Paternal Grandfather   . Heart disease Paternal Grandfather   . Hypertension Paternal Grandfather   . Stroke Paternal Grandfather   . Breast cancer Neg Hx    Social History   Socioeconomic History  . Marital status: Single    Spouse name: Not on file  . Number of children: Not on file  . Years  of education: Not on file  . Highest education level: Not on file  Occupational History  . Occupation: work from home, Designer, jewellery - [employer]  Social Needs  . Financial resource strain: Not on file  . Food insecurity    Worry: Not on file    Inability: Not on file  . Transportation needs    Medical: Not on file    Non-medical: Not on file  Tobacco Use  . Smoking status: Never Smoker  . Smokeless tobacco: Never Used  Substance and Sexual Activity  . Alcohol use: Not Currently    Alcohol/week: 0.0 standard drinks  . Drug use: Not Currently  . Sexual activity: Never  Lifestyle  . Physical activity    Days per week: Not on file    Minutes per session: Not on file  . Stress: Not on file  Relationships  . Social  Herbalist on phone: Not on file    Gets together: Not on file    Attends religious service: Not on file    Active member of club or organization: Not on file    Attends meetings of clubs or organizations: Not on file    Relationship status: Not on file  . Intimate partner violence    Fear of current or ex partner: Not on file    Emotionally abused: Not on file    Physically abused: Not on file    Forced sexual activity: Not on file  Other Topics Concern  . Not on file  Social History Narrative   Works for [employer] from home    No kids    No guns    Wears seat belt    Safe in relationship    Current Meds  Medication Sig  . Cholecalciferol (VITAMIN D3) 1.25 MG (50000 UT) CAPS   . FLUoxetine (PROZAC) 20 MG tablet Take 2 tablets (40 mg total) by mouth daily.  [name] glucosamine-chondroitin 500-400 MG tablet Take 1 tablet by mouth 3 (three) times daily.  . hydrochlorothiazide (HYDRODIURIL) 25 MG tablet Take 1 tablet (25 mg total) by mouth daily. In am  . lisinopril (PRINIVIL,ZESTRIL) 30 MG tablet Take 1 tablet (30 mg total) by mouth daily.  [name] loratadine (CLARITIN) 10 MG tablet Take 10 mg by mouth daily.  . medroxyPROGESTERone (PROVERA) 2.5 MG tablet Take 1 tablet (2.5 mg total) by mouth daily.  . Multiple Vitamin (MULTIVITAMIN) capsule Take 1 capsule by mouth daily.  . Omega-3 Fatty Acids (FISH OIL) 1000 MG CAPS Take 1,000 mg by mouth daily.  . [DISCONTINUED] metFORMIN (GLUCOPHAGE) 500 MG tablet daily with food   Allergies  Allergen Reactions  . Dust Mite Extract   . Latex   . Pollen Extract    Recent Results (from the past 2160 hour(s))  Microalbumin / creatinine urine ratio     Status: None   Collection Time: 09/25/18  8:03 AM  Result Value Ref Range   Creatinine, Urine 142 20 - 275 mg/dL   Microalb, Ur 2.3 mg/dL    Comment: Reference Range Not established    Microalb Creat Ratio 16 <30 mcg/mg creat    Comment: . The ADA defines abnormalities in albumin excretion as  follows: [name] Category         Result (mcg/mg creatinine) . Normal                    <30 Microalbuminuria         30-299  Clinical  albuminuria   > OR = 300 . The ADA recommends that at least two of three specimens collected within a 3-6 month period be abnormal before considering a patient to be within a diagnostic category.   Urinalysis, Routine w reflex microscopic     Status: Abnormal   Collection Time: 09/25/18  8:03 AM  Result Value Ref Range   Color, Urine YELLOW YELLOW   APPearance CLEAR CLEAR   Specific Gravity, Urine 1.021 1.001 - 1.03   pH 5.5 5.0 - 8.0   Glucose, UA NEGATIVE NEGATIVE   Bilirubin Urine NEGATIVE NEGATIVE   Ketones, ur NEGATIVE NEGATIVE   Hgb urine dipstick 3+ (A) NEGATIVE   Protein, ur NEGATIVE NEGATIVE   Nitrite NEGATIVE NEGATIVE   Leukocytes,Ua 2+ (A) NEGATIVE   WBC, UA 20-40 (A) 0 - 5 /HPF   RBC / HPF 10-20 (A) 0 - 2 /HPF   Squamous Epithelial / LPF 6-10 (A) < OR = 5 /HPF   Bacteria, UA NONE SEEN NONE SEEN /HPF   Hyaline Cast NONE SEEN NONE SEEN /LPF  Hemoglobin A1c     Status: None   Collection Time: 09/25/18  8:03 AM  Result Value Ref Range   Hgb A1c MFr Bld 5.6 4.6 - 6.5 %    Comment: Glycemic Control Guidelines for People with Diabetes:Non Diabetic:  <6%Goal of Therapy: <7%Additional Action Suggested:  >8%   Lipid panel     Status: Abnormal   Collection Time: 09/25/18  8:03 AM  Result Value Ref Range   Cholesterol 178 0 - 200 mg/dL    Comment: ATP III Classification       Desirable:  < 200 mg/dL               Borderline High:  200 - 239 mg/dL          High:  > = 240 mg/dL   Triglycerides 122.0 0.0 - 149.0 mg/dL    Comment: Normal:  <150 mg/dLBorderline High:  150 - 199 mg/dL   HDL 43.00 >39.00 mg/dL   VLDL 24.4 0.0 - 40.0 mg/dL   LDL Cholesterol 111 (H) 0 - 99 mg/dL   Total CHOL/HDL Ratio 4     Comment:                Men          Women1/2 Average Risk     3.4          3.3Average Risk          5.0          4.42X Average Risk          9.6           7.13X Average Risk          15.0          11.0                       NonHDL 135.26     Comment: NOTE:  Non-HDL goal should be 30 mg/dL higher than patient's LDL goal (i.e. LDL goal of < 70 mg/dL, would have non-HDL goal of < 100 mg/dL)  CBC w/Diff     Status: Abnormal   Collection Time: 09/25/18  8:03 AM  Result Value Ref Range   WBC 8.2 4.0 - 10.5 K/uL   RBC 4.59 3.87 - 5.11 Mil/uL   Hemoglobin 12.0 12.0 - 15.0 g/dL   HCT 37.2 36.0 - 46.0 %  MCV 81.0 78.0 - 100.0 fl   MCHC 32.4 30.0 - 36.0 g/dL   RDW 16.6 (H) 11.5 - 15.5 %   Platelets 230.0 150.0 - 400.0 K/uL   Neutrophils Relative % 72.9 43.0 - 77.0 %   Lymphocytes Relative 16.8 12.0 - 46.0 %   Monocytes Relative 8.1 3.0 - 12.0 %   Eosinophils Relative 1.8 0.0 - 5.0 %   Basophils Relative 0.4 0.0 - 3.0 %   Neutro Abs 6.0 1.4 - 7.7 K/uL   Lymphs Abs 1.4 0.7 - 4.0 K/uL   Monocytes Absolute 0.7 0.1 - 1.0 K/uL   Eosinophils Absolute 0.1 0.0 - 0.7 K/uL   Basophils Absolute 0.0 0.0 - 0.1 K/uL  Comprehensive metabolic panel     Status: Abnormal   Collection Time: 09/25/18  8:03 AM  Result Value Ref Range   Sodium 138 135 - 145 mEq/L   Potassium 3.9 3.5 - 5.1 mEq/L   Chloride 101 96 - 112 mEq/L   CO2 26 19 - 32 mEq/L   Glucose, Bld 97 70 - 99 mg/dL   BUN 19 6 - 23 mg/dL   Creatinine, Ser 1.00 0.40 - 1.20 mg/dL   Total Bilirubin 1.2 0.2 - 1.2 mg/dL   Alkaline Phosphatase 59 39 - 117 U/L   AST 14 0 - 37 U/L   ALT 18 0 - 35 U/L   Total Protein 6.3 6.0 - 8.3 g/dL   Albumin 4.1 3.5 - 5.2 g/dL   Calcium 8.7 8.4 - 10.5 mg/dL   GFR 58.79 (L) >60.00 mL/min   Objective  Body mass index is 85.14 kg/m. Wt Readings from Last 3 Encounters:  12/04/18 (!) 450 lb 9.6 oz (204.4 kg)  10/08/18 (!) 444 lb 3 oz (201.5 kg)  03/25/18 (!) 450 lb (204.1 kg)   Temp Readings from Last 3 Encounters:  12/04/18 98.8 F (37.1 C) (Oral)  03/25/18 98.4 F (36.9 C)  02/27/18 98.1 F (36.7 C) (Oral)   BP Readings from Last 3 Encounters:   12/04/18 132/82  10/08/18 136/77  03/25/18 (!) 144/71   Pulse Readings from Last 3 Encounters:  12/04/18 82  10/08/18 82  03/25/18 79    Physical Exam Vitals signs and nursing note reviewed.  Constitutional:      Appearance: Normal appearance. She is well-developed and well-groomed. She is morbidly obese.  HENT:     Head: Normocephalic and atraumatic.     Comments: +mask on   Cardiovascular:     Rate and Rhythm: Normal rate and regular rhythm.     Heart sounds: Normal heart sounds. No murmur.  Pulmonary:     Effort: Pulmonary effort is normal.     Breath sounds: Normal breath sounds.  Skin:    General: Skin is warm and dry.     Comments: Stasis derm to b/l legs    Neurological:     General: No focal deficit present.     Mental Status: She is alert and oriented to person, place, and time. Mental status is at baseline.     Gait: Gait normal.     Comments: Walks with cane    Psychiatric:        Attention and Perception: Attention and perception normal.        Mood and Affect: Mood and affect normal.        Speech: Speech normal.        Behavior: Behavior normal. Behavior is cooperative.        Thought  Content: Thought content normal.        Cognition and Memory: Cognition normal.        Judgment: Judgment normal.     Assessment  Plan  Annual physical exam Flu shot given today  UTD Tdap  rec MMR and hep B vaccine pt to think about  Declines STD testing  Pap 02/02/16 neg neg HPV f/u [facility] Dr[facility] Chauncey Cruel [facility] Philip Aspen [facility] to see if needs to w/u DUB and pap due 02/02/2019  6/28/19mammogramnegreferred today for another sch 12/23/18 8 am  Derm is Dr. Juanetta Gosling 2020  Colonoscopy consider in future age 35 y.o 02/2019  Venous stasis dermatitis of both lower extremities - Plan: triamcinolone cream (KENALOG) 0.1 %  Morbid obesity ([place]) BMI85.14-rec continue healthy die tand exercise   Type 2 diabetes mellitus without complication,  without long-term current use of insulin ([facility]) -A1C 5.6 stop metformin for now   Essential hypertension -cont meds for now and monitor  rec try to avoid nsaids if she can  Numbness of toes 2/2 peripheral neuropathy will CC Dr. [name] Franklin to see what he thinks Disc alpha lipoic acid, wt loss and scrambler therapy if worsening in the future  Per Dr. [name] Franklin  As part of work-up, usually do blood work to exclude diabetes, B12 deficiency, lupus, Lyme disease, do SIEP to look for monoclonal antibodies. If the patient is having discomfort, not just numbness, medication such as gabapentin or Lyrica is usually initiated to help tone down the pain. We will be happy to see the patient back in consultation if desired.   Depression, unspecified depression type  -cont meds     Consider H/O in futureif WBC remains elevated  Provider: Dr. Olivia Mackie McLean-Scocuzza-Internal Medicine

## 2018-12-04 NOTE — Patient Instructions (Addendum)
Alpha lipoic acid 600 mg 2x per day  The Next 56 days online nutrition program    Neuropathic Pain Neuropathic pain is pain caused by damage to the nerves that are responsible for certain sensations in your body (sensory nerves). The pain can be caused by:  Damage to the sensory nerves that send signals to your spinal cord and brain (peripheral nervous system).  Damage to the sensory nerves in your brain or spinal cord (central nervous system). Neuropathic pain can make you more sensitive to pain. Even a minor sensation can feel very painful. This is usually a long-term condition that can be difficult to treat. The type of pain differs from person to person. It may:  Start suddenly (acute), or it may develop slowly and last for a long time (chronic).  Come and go as damaged nerves heal, or it may stay at the same level for years.  Cause emotional distress, loss of sleep, and a lower quality of life. What are the causes? The most common cause of this condition is diabetes. Many other diseases and conditions can also cause neuropathic pain. Causes of neuropathic pain can be classified as:  Toxic. This is caused by medicines and chemicals. The most common cause of toxic neuropathic pain is damage from cancer treatments (chemotherapy).  Metabolic. This can be caused by: ? Diabetes. This is the most common disease that damages the nerves. ? Lack of vitamin B from long-term alcohol abuse.  Traumatic. Any injury that cuts, crushes, or stretches a nerve can cause damage and pain. A common example is feeling pain after losing an arm or leg (phantom limb pain).  Compression-related. If a sensory nerve gets trapped or compressed for a long period of time, the blood supply to the nerve can be cut off.  Vascular. Many blood vessel diseases can cause neuropathic pain by decreasing blood supply and oxygen to nerves.  Autoimmune. This type of pain results from diseases in which the body's defense  system (immune system) mistakenly attacks sensory nerves. Examples of autoimmune diseases that can cause neuropathic pain include lupus and multiple sclerosis.  Infectious. Many types of viral infections can damage sensory nerves and cause pain. Shingles infection is a common cause of this type of pain.  Inherited. Neuropathic pain can be a symptom of many diseases that are passed down through families (genetic). What increases the risk? You are more likely to develop this condition if:  You have diabetes.  You smoke.  You drink too much alcohol.  You are taking certain medicines, including medicines that kill cancer cells (chemotherapy) or that treat immune system disorders. What are the signs or symptoms? The main symptom is pain. Neuropathic pain is often described as:  Burning.  Shock-like.  Stinging.  Hot or cold.  Itching. How is this diagnosed? No single test can diagnose neuropathic pain. It is diagnosed based on:  Physical exam and your symptoms. Your health care provider will ask you about your pain. You may be asked to use a pain scale to describe how bad your pain is.  Tests. These may be done to see if you have a high sensitivity to pain and to help find the cause and location of any sensory nerve damage. They include: ? Nerve conduction studies to test how well nerve signals travel through your sensory nerves (electrodiagnostic testing). ? Stimulating your sensory nerves through electrodes on your skin and measuring the response in your spinal cord and brain (somatosensory evoked potential).  Imaging studies,  such as: ? X-rays. ? CT scan. ? MRI. How is this treated? Treatment for neuropathic pain may change over time. You may need to try different treatment options or a combination of treatments. Some options include:  Treating the underlying cause of the neuropathy, such as diabetes, kidney disease, or vitamin deficiencies.  Stopping medicines that can  cause neuropathy, such as chemotherapy.  Medicine to relieve pain. Medicines may include: ? Prescription or over-the-counter pain medicine. ? Anti-seizure medicine. ? Antidepressant medicines. ? Pain-relieving patches that are applied to painful areas of skin. ? A medicine to numb the area (local anesthetic), which can be injected as a nerve block.  Transcutaneous nerve stimulation. This uses electrical currents to block painful nerve signals. The treatment is painless.  Alternative treatments, such as: ? Acupuncture. ? Meditation. ? Massage. ? Physical therapy. ? Pain management programs. ? Counseling. Follow these instructions at home: Medicines   Take over-the-counter and prescription medicines only as told by your health care provider.  Do not drive or use heavy machinery while taking prescription pain medicine.  If you are taking prescription pain medicine, take actions to prevent or treat constipation. Your health care provider may recommend that you: ? Drink enough fluid to keep your urine pale yellow. ? Eat foods that are high in fiber, such as fresh fruits and vegetables, whole grains, and beans. ? Limit foods that are high in fat and processed sugars, such as fried or sweet foods. ? Take an over-the-counter or prescription medicine for constipation. Lifestyle   Have a good support system at home.  Consider joining a chronic pain support group.  Do not use any products that contain nicotine or tobacco, such as cigarettes and e-cigarettes. If you need help quitting, ask your health care provider.  Do not drink alcohol. General instructions  Learn as much as you can about your condition.  Work closely with all your health care providers to find the treatment plan that works best for you.  Ask your health care provider what activities are safe for you.  Keep all follow-up visits as told by your health care provider. This is important. Contact a health care  provider if:  Your pain treatments are not working.  You are having side effects from your medicines.  You are struggling with tiredness (fatigue), mood changes, depression, or anxiety. Summary  Neuropathic pain is pain caused by damage to the nerves that are responsible for certain sensations in your body (sensory nerves).  Neuropathic pain may come and go as damaged nerves heal, or it may stay at the same level for years.  Neuropathic pain is usually a long-term condition that can be difficult to treat. Consider joining a chronic pain support group. This information is not intended to replace advice given to you by your health care provider. Make sure you discuss any questions you have with your health care provider. Document Released: 12/02/2003 Document Revised: 06/27/2018 Document Reviewed: 03/23/2017 Elsevier Patient Education  2020 [place].   Stasis Dermatitis Stasis dermatitis is a long-term (chronic) skin condition that happens when veins can no longer pump blood back to the heart (poor circulation). This condition causes a red or brown scaly rash or sores (ulcers) from the pooling of blood (stasis). This condition usually affects the lower legs. It may affect one leg or both legs. Without treatment, severe stasis dermatitis can lead to other skin conditions and infections. What are the causes? This condition is caused by poor circulation. What increases the risk?  You are more likely to develop this condition if:  You are not very active.  You stand for long periods of time.  You have veins that have become enlarged and twisted (varicose veins).  You have leg veins that are not strong enough to send blood back to the heart (venous insufficiency).  You have had a blood clot.  You have been pregnant many times.  You have had vein surgery.  You are obese.  You have heart or kidney failure.  You are 55 years of age or older.  You have had injuries to your  legs in the past. What are the signs or symptoms? Common early symptoms of this condition include:  Itchiness in one or both of your legs.  Swelling in your ankle or leg. This might get better overnight but be worse again during the day.  Skin that looks thin on your ankle and leg.  Red or brown marks that develop slowly.  Skin that is dry, cracked, or easily irritated.  Red, swollen skin that is sore or has a burning feeling.  An achy or heavy feeling after you walk or stand for long periods of time.  Pain. Later and more severe symptoms of this condition include:  Skin that looks shiny.  Small, open sores (ulcers). These are often red or purple and leak fluid.  Skin that feels hard.  Severe itching.  A change in the shape or color of your lower legs.  Severe pain.  Difficulty walking. How is this diagnosed? This condition may be diagnosed based on:  Your symptoms and medical history.  A physical exam. You may also have tests, including:  Blood tests.  Imaging tests to check blood flow (Doppler ultrasound).  Allergy tests. You may need to see a health care provider who specializes in skin diseases (dermatologist). How is this treated? This condition may be treated with:  Compression stockings or an elastic wrap to improve circulation.  Medicines, such as: ? Corticosteroid creams and ointments. ? Non-corticosteroid medicines applied to the skin (topical). ? Medicine to reduce swelling in the legs (diuretics). ? Antibiotics. ? Medicine to relieve itching (antihistamines).  A bandage (dressing).  A wrap that contains zinc and gelatin (Unna boot). Follow these instructions at home: Skin care  Moisturize your skin as told by your health care provider. Do not use moisturizers with fragrance. This can irritate your skin.  Apply a cool, wet cloth (cool compress) to the affected areas.  Do not scratch your skin.  Do not rub your skin dry after a bath or  shower. Gently pat your skin dry.  Do not use scented soaps, detergents, or perfumes. Medicines  Take or use over-the-counter and prescription medicines only as told by your health care provider.  If you were prescribed an antibiotic medicine, take or use it as told by your health care provider. Do not stop taking or using the antibiotic even if your condition improves. Activity  Walk as told by your health care provider. Walking increases blood flow.  Do calf and ankle exercises throughout the day as told by your health care provider. This will help increase blood flow.  Raise (elevate) your legs above the level of your heart when you are sitting or lying down. Lifestyle  Work with your health care provider to lose weight, if needed.  Do not cross your legs when you sit.  Do not stand or sit in one position for long periods of time.  Wear comfortable, loose-fitting  clothing. Circulation in your legs will be worse if you wear tight pants, belts, and waistbands.  Do not use any products that contain nicotine or tobacco, such as cigarettes, e-cigarettes, and chewing tobacco. If you need help quitting, ask your health care provider. General instructions  If you were asked to use one of the following to help with your condition, follow instructions from your health care provider on how to: ? Remove and change any dressing. ? Wear compression stockings. These stockings help to prevent blood clots and reduce swelling in your legs. ? Wear the [employer].  Keep all follow-up visits as told by your health care provider. This is important. Contact a health care provider if:  Your condition does not improve with treatment.  Your condition gets worse.  You have signs of infection in the affected area. Watch for: ? Swelling. ? Tenderness. ? Redness. ? Soreness. ? Warmth.  You have a fever. Get help right away if:  You notice red streaks coming from the affected area.  Your bone  or joint underneath the affected area becomes painful after the skin has healed.  The affected area turns darker.  You feel a deep pain in your leg or groin.  You are short of breath. Summary  Stasis dermatitis is a long-term (chronic) skin condition that happens when veins can no longer pump blood back to the heart (poor circulation).  Wear compression stockings as told by your health care provider. These stockings help to prevent blood clots and reduce swelling in your legs.  Follow instructions from your health care provider about activity, medicines, and lifestyle.  Contact a health care provider if you have a fever or have signs of infection in the affected area.  Keep all follow-up visits as told by your health care provider. This is important. This information is not intended to replace advice given to you by your health care provider. Make sure you discuss any questions you have with your health care provider. Document Released: 06/15/2005 Document Revised: 08/06/2017 Document Reviewed: 08/06/2017 Elsevier Patient Education  2020 [employer].

## 2018-12-11 ENCOUNTER — Ambulatory Visit: Payer: No Typology Code available for payment source | Admitting: Internal Medicine

## 2018-12-23 ENCOUNTER — Other Ambulatory Visit: Payer: Self-pay | Admitting: Internal Medicine

## 2018-12-23 ENCOUNTER — Encounter: Payer: Self-pay | Admitting: Internal Medicine

## 2018-12-23 DIAGNOSIS — R102 Pelvic and perineal pain: Secondary | ICD-10-CM

## 2018-12-25 ENCOUNTER — Encounter: Payer: No Typology Code available for payment source | Admitting: Certified Nurse Midwife

## 2019-01-14 ENCOUNTER — Encounter: Payer: No Typology Code available for payment source | Admitting: Obstetrics and Gynecology

## 2019-01-20 MED FILL — MEDROXYPROGESTERONE 2.5 MG: 2.5 | 90 days supply | Qty: 90 | Fill #1

## 2019-01-20 MED FILL — FLUoxetine HCL 20 MG CAPS: 20 | 90 days supply | Qty: 180 | Fill #1

## 2019-03-17 ENCOUNTER — Encounter: Payer: Self-pay | Admitting: Internal Medicine

## 2019-03-17 MED FILL — LISINOPRIL 30 MG TABLET: 30 | 90 days supply | Qty: 90 | Fill #0

## 2019-03-17 MED FILL — HYDROCHLOROTHIAZIDE 25 MG T: 25 | 90 days supply | Qty: 90 | Fill #0

## 2019-03-20 ENCOUNTER — Other Ambulatory Visit: Payer: Self-pay | Admitting: Internal Medicine

## 2019-03-20 DIAGNOSIS — R937 Abnormal findings on diagnostic imaging of other parts of musculoskeletal system: Secondary | ICD-10-CM

## 2019-03-20 DIAGNOSIS — G629 Polyneuropathy, unspecified: Secondary | ICD-10-CM

## 2019-03-20 MED ORDER — GABAPENTIN 300 MG PO CAPS: 300.0000 mg | ORAL_CAPSULE | Freq: Every day | ORAL | 3 refills | Status: DC

## 2019-03-26 ENCOUNTER — Telehealth: Payer: Self-pay | Admitting: Neurology

## 2019-03-26 NOTE — Telephone Encounter (Signed)
I have informed the referring office

## 2019-03-26 NOTE — Telephone Encounter (Signed)
I really don;t think that I would have anything more to offer patient than Dr. [name] Franklin would, thanks

## 2019-03-26 NOTE — Telephone Encounter (Signed)
Pt was last seen 09/2018 w Dr. [name] Franklin for NCV/EMG but would like to switch her care over to DXr. Ahern for  Peripheral polyneuropathy, is it ok for pt to switch?

## 2019-03-29 NOTE — Telephone Encounter (Signed)
I have never seen this patient in consultation, only did EMG evaluation, I have never treated the patient otherwise.

## 2019-03-31 ENCOUNTER — Encounter: Payer: Self-pay | Admitting: Internal Medicine

## 2019-04-16 ENCOUNTER — Encounter: Payer: Self-pay | Admitting: Internal Medicine

## 2019-04-21 ENCOUNTER — Other Ambulatory Visit: Payer: Self-pay | Admitting: Internal Medicine

## 2019-04-21 DIAGNOSIS — G629 Polyneuropathy, unspecified: Secondary | ICD-10-CM

## 2019-04-21 MED FILL — MEDROXYPROGESTERONE 2.5 MG: 2.5 | 90 days supply | Qty: 90 | Fill #2

## 2019-04-21 MED FILL — FLUoxetine HCL 20 MG CAPS: 20 | 90 days supply | Qty: 180 | Fill #2

## 2019-05-23 ENCOUNTER — Encounter: Payer: Self-pay | Admitting: Internal Medicine

## 2019-06-02 ENCOUNTER — Encounter: Payer: Self-pay | Admitting: Internal Medicine

## 2019-06-03 NOTE — Telephone Encounter (Signed)
Wanting to add on Vitamin B12, Vitamin B1, Vitamin D, Vitamin B6, Sedimentation, Folate, TSH, and IFE and PE Serum labs.   Okay to order?

## 2019-06-04 ENCOUNTER — Other Ambulatory Visit: Payer: Self-pay

## 2019-06-04 ENCOUNTER — Other Ambulatory Visit: Payer: Self-pay | Admitting: Internal Medicine

## 2019-06-04 ENCOUNTER — Telehealth: Payer: Self-pay | Admitting: Internal Medicine

## 2019-06-04 ENCOUNTER — Other Ambulatory Visit (INDEPENDENT_AMBULATORY_CARE_PROVIDER_SITE_OTHER): Payer: No Typology Code available for payment source

## 2019-06-04 DIAGNOSIS — I10 Essential (primary) hypertension: Secondary | ICD-10-CM

## 2019-06-04 DIAGNOSIS — Z1329 Encounter for screening for other suspected endocrine disorder: Secondary | ICD-10-CM | POA: Diagnosis not present

## 2019-06-04 DIAGNOSIS — G629 Polyneuropathy, unspecified: Secondary | ICD-10-CM

## 2019-06-04 DIAGNOSIS — E119 Type 2 diabetes mellitus without complications: Secondary | ICD-10-CM

## 2019-06-04 DIAGNOSIS — E559 Vitamin D deficiency, unspecified: Secondary | ICD-10-CM | POA: Diagnosis not present

## 2019-06-04 DIAGNOSIS — R319 Hematuria, unspecified: Secondary | ICD-10-CM | POA: Diagnosis not present

## 2019-06-04 LAB — LIPID PANEL
Cholesterol: 178 mg/dL (ref 0–200)
HDL: 38.5 mg/dL — ABNORMAL LOW (ref >39.00)
LDL Cholesterol: 117 mg/dL — ABNORMAL HIGH (ref 0–99)
NonHDL: 139.6
Total CHOL/HDL Ratio: 5
Triglycerides: 111 mg/dL (ref 0.0–149.0)
VLDL: 22.2 mg/dL (ref 0.0–40.0)

## 2019-06-04 LAB — FOLATE: Folate: 21 ng/mL (ref >5.9)

## 2019-06-04 LAB — VITAMIN D 25 HYDROXY (VIT D DEFICIENCY, FRACTURES): VITD: 38.28 ng/mL (ref 30.00–100.00)

## 2019-06-04 LAB — COMPREHENSIVE METABOLIC PANEL
ALT: 19 U/L (ref 0–35)
AST: 16 U/L (ref 0–37)
Albumin: 4 g/dL (ref 3.5–5.2)
Alkaline Phosphatase: 51 U/L (ref 39–117)
BUN: 19 mg/dL (ref 6–23)
CO2: 25 mEq/L (ref 19–32)
Calcium: 9.3 mg/dL (ref 8.4–10.5)
Chloride: 102 mEq/L (ref 96–112)
Creatinine, Ser: 0.92 mg/dL (ref 0.40–1.20)
GFR: 64.55 mL/min (ref >60.00)
Glucose, Bld: 110 mg/dL — ABNORMAL HIGH (ref 70–99)
Potassium: 4 mEq/L (ref 3.5–5.1)
Sodium: 137 mEq/L (ref 135–145)
Total Bilirubin: 0.8 mg/dL (ref 0.2–1.2)
Total Protein: 7 g/dL (ref 6.0–8.3)

## 2019-06-04 LAB — URINALYSIS, ROUTINE W REFLEX MICROSCOPIC
Bilirubin Urine: NEGATIVE
Ketones, ur: NEGATIVE
Leukocytes,Ua: NEGATIVE
Nitrite: NEGATIVE
Specific Gravity, Urine: 1.025 (ref 1.000–1.030)
Total Protein, Urine: NEGATIVE
Urine Glucose: NEGATIVE
Urobilinogen, UA: 0.2 (ref 0.0–1.0)
pH: 6 (ref 5.0–8.0)

## 2019-06-04 LAB — CBC WITH DIFFERENTIAL/PLATELET
Basophils Absolute: 0 10*3/uL (ref 0.0–0.1)
Basophils Relative: 0.2 % (ref 0.0–3.0)
Eosinophils Absolute: 0.2 10*3/uL (ref 0.0–0.7)
Eosinophils Relative: 1.9 % (ref 0.0–5.0)
HCT: 38.5 % (ref 36.0–46.0)
Hemoglobin: 12.5 g/dL (ref 12.0–15.0)
Lymphocytes Relative: 20.7 % (ref 12.0–46.0)
Lymphs Abs: 1.8 10*3/uL (ref 0.7–4.0)
MCHC: 32.4 g/dL (ref 30.0–36.0)
MCV: 79.7 fl (ref 78.0–100.0)
Monocytes Absolute: 0.5 10*3/uL (ref 0.1–1.0)
Monocytes Relative: 6.1 % (ref 3.0–12.0)
Neutro Abs: 6.2 10*3/uL (ref 1.4–7.7)
Neutrophils Relative %: 71.1 % (ref 43.0–77.0)
Platelets: 217 10*3/uL (ref 150.0–400.0)
RBC: 4.84 Mil/uL (ref 3.87–5.11)
RDW: 16.9 % — ABNORMAL HIGH (ref 11.5–15.5)
WBC: 8.7 10*3/uL (ref 4.0–10.5)

## 2019-06-04 LAB — VITAMIN B12: Vitamin B-12: 892 pg/mL (ref 211–911)

## 2019-06-04 LAB — SEDIMENTATION RATE: Sed Rate: 65 mm/hr — ABNORMAL HIGH (ref 0–30)

## 2019-06-04 LAB — TSH: TSH: 1.32 u[IU]/mL (ref 0.35–4.50)

## 2019-06-04 LAB — HEMOGLOBIN A1C: Hgb A1c MFr Bld: 6 % (ref 4.6–6.5)

## 2019-06-04 NOTE — Telephone Encounter (Signed)
Patient came in this morning for labs. Spoke with Latoya and she did draw an extra tube on this patient.

## 2019-06-04 NOTE — Telephone Encounter (Signed)
I am just seeing this message from 06/03/19 for pt asking to add on or do neurology labs with our labs  Please have her come back to collect these labs or add on to labs collected today if possible  She came in 8 am 06/04/19   [place]

## 2019-06-04 NOTE — Telephone Encounter (Signed)
I was able to add everything except the Vitamin B6.

## 2019-06-04 NOTE — Addendum Note (Signed)
Addended by: Leeanne Rio on: 06/04/2019 11:52 AM   Modules accepted: Orders

## 2019-06-04 NOTE — Addendum Note (Signed)
Addended by: Leeanne Rio on: 06/04/2019 11:46 AM   Modules accepted: Orders

## 2019-06-04 NOTE — Addendum Note (Signed)
Addended by: Leeanne Rio on: 06/04/2019 11:47 AM   Modules accepted: Orders

## 2019-06-05 LAB — URINE CULTURE
MICRO NUMBER:: 10261636
SPECIMEN QUALITY:: ADEQUATE

## 2019-06-06 LAB — PROTEIN ELECTROPHORESIS, SERUM
Albumin ELP: 3.8 g/dL (ref 3.8–4.8)
Alpha 1: 0.4 g/dL — ABNORMAL HIGH (ref 0.2–0.3)
Alpha 2: 0.7 g/dL (ref 0.5–0.9)
Beta 2: 0.4 g/dL (ref 0.2–0.5)
Beta Globulin: 0.5 g/dL (ref 0.4–0.6)
Gamma Globulin: 0.7 g/dL — ABNORMAL LOW (ref 0.8–1.7)
Total Protein: 6.4 g/dL (ref 6.1–8.1)

## 2019-06-08 ENCOUNTER — Other Ambulatory Visit: Payer: Self-pay | Admitting: Internal Medicine

## 2019-06-08 ENCOUNTER — Telehealth: Payer: Self-pay | Admitting: Internal Medicine

## 2019-06-08 DIAGNOSIS — Z124 Encounter for screening for malignant neoplasm of cervix: Secondary | ICD-10-CM

## 2019-06-08 DIAGNOSIS — N938 Other specified abnormal uterine and vaginal bleeding: Secondary | ICD-10-CM

## 2019-06-08 NOTE — Telephone Encounter (Signed)
Referral placed due for pap smear was due 01/2019 and also h/o DUB with hematuria needs further w/u and I want to order CT abdomen/pelvis reviewing this with patient  Belinda Garrett

## 2019-06-09 ENCOUNTER — Other Ambulatory Visit: Payer: Self-pay

## 2019-06-11 ENCOUNTER — Other Ambulatory Visit: Payer: Self-pay | Admitting: Internal Medicine

## 2019-06-11 ENCOUNTER — Encounter: Payer: Self-pay | Admitting: Internal Medicine

## 2019-06-11 ENCOUNTER — Ambulatory Visit (INDEPENDENT_AMBULATORY_CARE_PROVIDER_SITE_OTHER): Payer: No Typology Code available for payment source | Admitting: Internal Medicine

## 2019-06-11 ENCOUNTER — Other Ambulatory Visit: Payer: Self-pay

## 2019-06-11 VITALS — BP 124/78 | HR 84 | Temp 97.7°F | Ht 61.0 in | Wt >= 6400 oz

## 2019-06-11 DIAGNOSIS — I872 Venous insufficiency (chronic) (peripheral): Secondary | ICD-10-CM | POA: Diagnosis not present

## 2019-06-11 DIAGNOSIS — F329 Major depressive disorder, single episode, unspecified: Secondary | ICD-10-CM

## 2019-06-11 DIAGNOSIS — G629 Polyneuropathy, unspecified: Secondary | ICD-10-CM

## 2019-06-11 DIAGNOSIS — R200 Anesthesia of skin: Secondary | ICD-10-CM

## 2019-06-11 DIAGNOSIS — M542 Cervicalgia: Secondary | ICD-10-CM

## 2019-06-11 DIAGNOSIS — I890 Lymphedema, not elsewhere classified: Secondary | ICD-10-CM | POA: Diagnosis not present

## 2019-06-11 DIAGNOSIS — E66813 Body mass index (BMI) 70 or greater, adult: Secondary | ICD-10-CM

## 2019-06-11 DIAGNOSIS — R937 Abnormal findings on diagnostic imaging of other parts of musculoskeletal system: Secondary | ICD-10-CM

## 2019-06-11 DIAGNOSIS — R7303 Prediabetes: Secondary | ICD-10-CM

## 2019-06-11 DIAGNOSIS — R778 Other specified abnormalities of plasma proteins: Secondary | ICD-10-CM | POA: Diagnosis not present

## 2019-06-11 DIAGNOSIS — N939 Abnormal uterine and vaginal bleeding, unspecified: Secondary | ICD-10-CM

## 2019-06-11 DIAGNOSIS — R600 Localized edema: Secondary | ICD-10-CM | POA: Insufficient documentation

## 2019-06-11 DIAGNOSIS — N938 Other specified abnormal uterine and vaginal bleeding: Secondary | ICD-10-CM

## 2019-06-11 DIAGNOSIS — I10 Essential (primary) hypertension: Secondary | ICD-10-CM

## 2019-06-11 DIAGNOSIS — Z6841 Body Mass Index (BMI) 40.0 and over, adult: Secondary | ICD-10-CM

## 2019-06-11 DIAGNOSIS — F32A Depression, unspecified: Secondary | ICD-10-CM

## 2019-06-11 LAB — VITAMIN B1: Vitamin B1 (Thiamine): 18 nmol/L (ref 8–30)

## 2019-06-11 MED ORDER — LISINOPRIL 30 MG PO TABS: 30.0000 mg | ORAL_TABLET | Freq: Every day | ORAL | 3 refills | Status: DC

## 2019-06-11 MED ORDER — HYDROCHLOROTHIAZIDE 25 MG PO TABS: 25.0000 mg | ORAL_TABLET | Freq: Every day | ORAL | 3 refills | Status: DC

## 2019-06-11 NOTE — Patient Instructions (Addendum)
55 to 64 ounces of water daily  06/19/2019 Initial consult Neurosurgery Ricard Dillon, [place] [address] [place], [place] [address]  [phone]  [phone] ([address])    [place], [place]  [place], [place] [address]  [phone]  [phone] (Fax)      Stasis Dermatitis Stasis dermatitis is a long-term (chronic) skin condition that happens when veins can no longer pump blood back to the heart (poor circulation). This condition causes a red or brown scaly rash or sores (ulcers) from the pooling of blood (stasis). This condition usually affects the lower legs. It may affect one leg or both legs. Without treatment, severe stasis dermatitis can lead to other skin conditions and infections. What are the causes? This condition is caused by poor circulation. What increases the risk? You are more likely to develop this condition if:  You are not very active.  You stand for long periods of time.  You have veins that have become enlarged and twisted (varicose veins).  You have leg veins that are not strong enough to send blood back to the heart (venous insufficiency).  You have had a blood clot.  You have been pregnant many times.  You have had vein surgery.  You are obese.  You have heart or kidney failure.  You are 51 years of age or older.  You have had injuries to your legs in the past. What are the signs or symptoms? Common early symptoms of this condition include:  Itchiness in one or both of your legs.  Swelling in your ankle or leg. This might get better overnight but be worse again during the day.  Skin that looks thin on your ankle and leg.  Red or brown marks that develop slowly.  Skin that is dry, cracked, or easily irritated.  Red, swollen skin that is sore or has a burning feeling.  An achy or heavy feeling after you walk or stand for long periods of time.  Pain. Later and  more severe symptoms of this condition include:  Skin that looks shiny.  Small, open sores (ulcers). These are often red or purple and leak fluid.  Skin that feels hard.  Severe itching.  A change in the shape or color of your lower legs.  Severe pain.  Difficulty walking. How is this diagnosed? This condition may be diagnosed based on:  Your symptoms and medical history.  A physical exam. You may also have tests, including:  Blood tests.  Imaging tests to check blood flow (Doppler ultrasound).  Allergy tests. You may need to see a health care provider who specializes in skin diseases (dermatologist). How is this treated? This condition may be treated with:  Compression stockings or an elastic wrap to improve circulation.  Medicines, such as: ? Corticosteroid creams and ointments. ? Non-corticosteroid medicines applied to the skin (topical). ? Medicine to reduce swelling in the legs (diuretics). ? Antibiotics. ? Medicine to relieve itching (antihistamines).  A bandage (dressing).  A wrap that contains zinc and gelatin (Unna boot). Follow these instructions at home: Skin care  Moisturize your skin as told by your health care provider. Do not use moisturizers with fragrance. This can irritate your skin.  Apply a cool, wet cloth (cool compress) to the affected areas.  Do not scratch your skin.  Do not rub your skin dry after a bath or shower. Gently pat your skin dry.  Do not use scented soaps, detergents,  or perfumes. Medicines  Take or use over-the-counter and prescription medicines only as told by your health care provider.  If you were prescribed an antibiotic medicine, take or use it as told by your health care provider. Do not stop taking or using the antibiotic even if your condition improves. Activity  Walk as told by your health care provider. Walking increases blood flow.  Do calf and ankle exercises throughout the day as told by your health  care provider. This will help increase blood flow.  Raise (elevate) your legs above the level of your heart when you are sitting or lying down. Lifestyle  Work with your health care provider to lose weight, if needed.  Do not cross your legs when you sit.  Do not stand or sit in one position for long periods of time.  Wear comfortable, loose-fitting clothing. Circulation in your legs will be worse if you wear tight pants, belts, and waistbands.  Do not use any products that contain nicotine or tobacco, such as cigarettes, e-cigarettes, and chewing tobacco. If you need help quitting, ask your health care provider. General instructions  If you were asked to use one of the following to help with your condition, follow instructions from your health care provider on how to: ? Remove and change any dressing. ? Wear compression stockings. These stockings help to prevent blood clots and reduce swelling in your legs. ? Wear the [employer].  Keep all follow-up visits as told by your health care provider. This is important. Contact a health care provider if:  Your condition does not improve with treatment.  Your condition gets worse.  You have signs of infection in the affected area. Watch for: ? Swelling. ? Tenderness. ? Redness. ? Soreness. ? Warmth.  You have a fever. Get help right away if:  You notice red streaks coming from the affected area.  Your bone or joint underneath the affected area becomes painful after the skin has healed.  The affected area turns darker.  You feel a deep pain in your leg or groin.  You are short of breath. Summary  Stasis dermatitis is a long-term (chronic) skin condition that happens when veins can no longer pump blood back to the heart (poor circulation).  Wear compression stockings as told by your health care provider. These stockings help to prevent blood clots and reduce swelling in your legs.  Follow instructions from your health care  provider about activity, medicines, and lifestyle.  Contact a health care provider if you have a fever or have signs of infection in the affected area.  Keep all follow-up visits as told by your health care provider. This is important. This information is not intended to replace advice given to you by your health care provider. Make sure you discuss any questions you have with your health care provider. Document Revised: 08/06/2017 Document Reviewed: 08/06/2017 Elsevier Patient Education  [place].   Lymphedema  Lymphedema is swelling that is caused by the abnormal collection of lymph in the tissues under the skin. Lymph is fluid from the tissues in your body that is removed through the lymphatic system. This system is part of your body's defense system (immune system) and includes lymph nodes and lymph vessels. The lymph vessels collect and carry the excess fluid, fats, proteins, and wastes from the tissues of the body to the bloodstream. This system also works to clean and remove bacteria and waste products from the body. Lymphedema occurs when the lymphatic system  is blocked. When the lymph vessels or lymph nodes are blocked or damaged, lymph does not drain properly. This causes an abnormal buildup of lymph, which leads to swelling in the affected area. This may include the trunk area, or an arm or leg. Lymphedema cannot be cured by medicines, but various methods can be used to help reduce the swelling. There are two types of lymphedema: primary lymphedema and secondary lymphedema. What are the causes? The cause of this condition depends on the type of lymphedema that you have.  Primary lymphedema is caused by the absence of lymph vessels or having abnormal lymph vessels at birth.  Secondary lymphedema occurs when lymph vessels are blocked or damaged. Secondary lymphedema is more common. Common causes of lymph vessel blockage include: ? Skin infection, such as  cellulitis. ? Infection by parasites (filariasis). ? Injury. ? Radiation therapy. ? Cancer. ? Formation of scar tissue. ? Surgery. What are the signs or symptoms? Symptoms of this condition include:  Swelling of the arm or leg.  A heavy or tight feeling in the arm or leg.  Swelling of the feet, toes, or fingers. Shoes or rings may fit more tightly than before.  Redness of the skin over the affected area.  Limited movement of the affected limb.  Sensitivity to touch or discomfort in the affected limb. How is this diagnosed? This condition may be diagnosed based on:  Your symptoms and medical history.  A physical exam.  Bioimpedance spectroscopy. In this test, painless electrical currents are used to measure fluid levels in your body.  Imaging tests, such as: ? Lymphoscintigraphy. In this test, a low dose of a radioactive substance is injected to trace the flow of lymph through the lymph vessels. ? MRI. ? CT scan. ? Duplex ultrasound. This test uses sound waves to produce images of the vessels and the blood flow on a screen. ? Lymphangiography. In this test, a contrast dye is injected into the lymph vessel to help show blockages. How is this treated? Treatment for this condition may depend on the cause of your lymphedema. Treatment may include:  Complete decongestive therapy (CDT). This is done by a certified lymphedema therapist to reduce fluid congestion. This therapy includes: ? Manual lymph drainage. This is a special massage technique that promotes lymph drainage out of a limb. ? Skin care. ? Compression wrapping of the affected area. ? Specific exercises. Certain exercises can help fluid move out of the affected limb.  Compression. Various methods may be used to apply pressure to the affected limb to reduce the swelling. They include: ? Wearing compression stockings or sleeves on the affected limb. ? Wrapping the affected limb with special bandages.  Surgery. This  is usually done for severe cases only. For example, surgery may be done if you have trouble moving the limb or if the swelling does not get better with other treatments. If an underlying condition is causing the lymphedema, treatment for that condition will be done. For example, antibiotic medicines may be used to treat an infection. Follow these instructions at home: Self-care  The affected area is more likely to become injured or infected. Take these steps to help prevent infection: ? Keep the affected area clean and dry. ? Use approved creams or lotions to keep the skin moisturized. ? Protect your skin from cuts:  Use gloves while cooking or gardening.  Do not walk barefoot.  If you shave the affected area, use an Copy.  Do not wear tight clothes,  shoes, or jewelry.  Eat a healthy diet that includes a lot of fruits and vegetables. Activity  Exercise regularly as directed by your health care provider.  Do not sit with your legs crossed.  When possible, keep the affected limb raised (elevated) above the level of your heart.  Avoid carrying things with an arm that is affected by lymphedema. General instructions  Wear compression stockings or sleeves as told by your health care provider.  Note any changes in size of the affected limb. You may be instructed to take regular measurements and keep track of them.  Take over-the-counter and prescription medicines only as told by your health care provider.  If you were prescribed an antibiotic medicine, take or apply it as told by your health care provider. Do not stop using the antibiotic even if you start to feel better.  Do not use heating pads or ice packs over the affected area.  Avoid having blood draws, IV insertions, or blood pressure checked on the affected limb.  Keep all follow-up visits as told by your health care provider. This is important. Contact a health care provider if you:  Continue to have swelling  in your limb.  Have a cut that does not heal.  Have redness or pain in the affected area. Get help right away if you:  Have new swelling in your limb that comes on suddenly.  Develop purplish spots, rash or sores (lesions) on your affected limb.  Have shortness of breath.  Have a fever or chills. Summary  Lymphedema is swelling that is caused by the abnormal collection of lymph in the tissues under the skin.  Lymph is fluid from the tissues in your body that is removed through the lymphatic system. This system collects and carries excess fluid, fats, proteins, and wastes from the tissues of the body to the bloodstream.  Lymphedema causes swelling, pain, and redness in the affected area. This may include the trunk area, or an arm or leg.  Treatment for this condition may depend on the cause of your lymphedema. Treatment may include complete decongestive therapy (CDT), compression methods, surgery, or treating the underlying cause. This information is not intended to replace advice given to you by your health care provider. Make sure you discuss any questions you have with your health care provider. Document Revised: 03/19/2017 Document Reviewed: 03/19/2017 Elsevier Patient Education  2020 [place].    Cervical Strain and Sprain Rehab Ask your health care provider which exercises are safe for you. Do exercises exactly as told by your health care provider and adjust them as directed. It is normal to feel mild stretching, pulling, tightness, or discomfort as you do these exercises. Stop right away if you feel sudden pain or your pain gets worse. Do not begin these exercises until told by your health care provider. Stretching and range-of-motion exercises Cervical side bending  1. Using good posture, sit on a stable chair or stand up. 2. Without moving your shoulders, slowly tilt your left / right ear to your shoulder until you feel a stretch in the opposite side neck muscles. You  should be looking straight ahead. 3. Hold for __________ seconds. 4. Repeat with the other side of your neck. Repeat __________ times. Complete this exercise __________ times a day. Cervical rotation  1. Using good posture, sit on a stable chair or stand up. 2. Slowly turn your head to the side as if you are looking over your left / right shoulder. ? Keep your  eyes level with the ground. ? Stop when you feel a stretch along the side and the back of your neck. 3. Hold for __________ seconds. 4. Repeat this by turning to your other side. Repeat __________ times. Complete this exercise __________ times a day. Thoracic extension and pectoral stretch 1. Roll a towel or a small blanket so it is about 4 inches (10 cm) in diameter. 2. Lie down on your back on a firm surface. 3. Put the towel lengthwise, under your spine in the middle of your back. It should not be under your shoulder blades. The towel should line up with your spine from your middle back to your lower back. 4. Put your hands behind your head and let your elbows fall out to your sides. 5. Hold for __________ seconds. Repeat __________ times. Complete this exercise __________ times a day. Strengthening exercises Isometric upper cervical flexion 1. Lie on your back with a thin pillow behind your head and a small rolled-up towel under your neck. 2. Gently tuck your chin toward your chest and nod your head down to look toward your feet. Do not lift your head off the pillow. 3. Hold for __________ seconds. 4. Release the tension slowly. Relax your neck muscles completely before you repeat this exercise. Repeat __________ times. Complete this exercise __________ times a day. Isometric cervical extension  1. Stand about 6 inches (15 cm) away from a wall, with your back facing the wall. 2. Place a soft object, about 6-8 inches (15-20 cm) in diameter, between the back of your head and the wall. A soft object could be a small pillow, a  ball, or a folded towel. 3. Gently tilt your head back and press into the soft object. Keep your jaw and forehead relaxed. 4. Hold for __________ seconds. 5. Release the tension slowly. Relax your neck muscles completely before you repeat this exercise. Repeat __________ times. Complete this exercise __________ times a day. Posture and body mechanics Body mechanics refers to the movements and positions of your body while you do your daily activities. Posture is part of body mechanics. Good posture and healthy body mechanics can help to relieve stress in your body's tissues and joints. Good posture means that your spine is in its natural S-curve position (your spine is neutral), your shoulders are pulled back slightly, and your head is not tipped forward. The following are general guidelines for applying improved posture and body mechanics to your everyday activities. Sitting  1. When sitting, keep your spine neutral and keep your feet flat on the floor. Use a footrest, if necessary, and keep your thighs parallel to the floor. Avoid rounding your shoulders, and avoid tilting your head forward. 2. When working at a desk or a computer, keep your desk at a height where your hands are slightly lower than your elbows. Slide your chair under your desk so you are close enough to maintain good posture. 3. When working at a computer, place your monitor at a height where you are looking straight ahead and you do not have to tilt your head forward or downward to look at the screen. Standing   When standing, keep your spine neutral and keep your feet about hip-width apart. Keep a slight bend in your knees. Your ears, shoulders, and hips should line up.  When you do a task in which you stand in one place for a long time, place one foot up on a stable object that is 2-4 inches (5-10 cm) high,  such as a footstool. This helps keep your spine neutral. Resting When lying down and resting, avoid positions that are  most painful for you. Try to support your neck in a neutral position. You can use a contour pillow or a small rolled-up towel. Your pillow should support your neck but not push on it. This information is not intended to replace advice given to you by your health care provider. Make sure you discuss any questions you have with your health care provider. Document Revised: 06/26/2018 Document Reviewed: 12/05/2017 Elsevier Patient Education  [place].  Neck Exercises Ask your health care provider which exercises are safe for you. Do exercises exactly as told by your health care provider and adjust them as directed. It is normal to feel mild stretching, pulling, tightness, or discomfort as you do these exercises. Stop right away if you feel sudden pain or your pain gets worse. Do not begin these exercises until told by your health care provider. Neck exercises can be important for many reasons. They can improve strength and maintain flexibility in your neck, which will help your upper back and prevent neck pain. Stretching exercises Rotation neck stretching  1. Sit in a chair or stand up. 2. Place your feet flat on the floor, shoulder width apart. 3. Slowly turn your head (rotate) to the right until a slight stretch is felt. Turn it all the way to the right so you can look over your right shoulder. Do not tilt or tip your head. 4. Hold this position for 10-30 seconds. 5. Slowly turn your head (rotate) to the left until a slight stretch is felt. Turn it all the way to the left so you can look over your left shoulder. Do not tilt or tip your head. 6. Hold this position for 10-30 seconds. Repeat __________ times. Complete this exercise __________ times a day. Neck retraction 1. Sit in a sturdy chair or stand up. 2. Look straight ahead. Do not bend your neck. 3. Use your fingers to push your chin backward (retraction). Do not bend your neck for this movement. Continue to face straight ahead. If  you are doing the exercise properly, you will feel a slight sensation in your throat and a stretch at the back of your neck. 4. Hold the stretch for 1-2 seconds. Repeat __________ times. Complete this exercise __________ times a day. Strengthening exercises Neck press 1. Lie on your back on a firm bed or on the floor with a pillow under your head. 2. Use your neck muscles to push your head down on the pillow and straighten your spine. 3. Hold the position as well as you can. Keep your head facing up (in a neutral position) and your chin tucked. 4. Slowly count to 5 while holding this position. Repeat __________ times. Complete this exercise __________ times a day. Isometrics These are exercises in which you strengthen the muscles in your neck while keeping your neck still (isometrics). 1. Sit in a supportive chair and place your hand on your forehead. 2. Keep your head and face facing straight ahead. Do not flex or extend your neck while doing isometrics. 3. Push forward with your head and neck while pushing back with your hand. Hold for 10 seconds. 4. Do the sequence again, this time putting your hand against the back of your head. Use your head and neck to push backward against the hand pressure. 5. Finally, do the same exercise on either side of your head, pushing sideways against  the pressure of your hand. Repeat __________ times. Complete this exercise __________ times a day. Prone head lifts 1. Lie face-down (prone position), resting on your elbows so that your chest and upper back are raised. 2. Start with your head facing downward, near your chest. Position your chin either on or near your chest. 3. Slowly lift your head upward. Lift until you are looking straight ahead. Then continue lifting your head as far back as you can comfortably stretch. 4. Hold your head up for 5 seconds. Then slowly lower it to your starting position. Repeat __________ times. Complete this exercise __________  times a day. Supine head lifts 1. Lie on your back (supine position), bending your knees to point to the ceiling and keeping your feet flat on the floor. 2. Lift your head slowly off the floor, raising your chin toward your chest. 3. Hold for 5 seconds. Repeat __________ times. Complete this exercise __________ times a day. Scapular retraction 1. Stand with your arms at your sides. Look straight ahead. 2. Slowly pull both shoulders (scapulae) backward and downward (retraction) until you feel a stretch between your shoulder blades in your upper back. 3. Hold for 10-30 seconds. 4. Relax and repeat. Repeat __________ times. Complete this exercise __________ times a day. Contact a health care provider if:  Your neck pain or discomfort gets much worse when you do an exercise.  Your neck pain or discomfort does not improve within 2 hours after you exercise. If you have any of these problems, stop exercising right away. Do not do the exercises again unless your health care provider says that you can. Get help right away if:  You develop sudden, severe neck pain. If this happens, stop exercising right away. Do not do the exercises again unless your health care provider says that you can. This information is not intended to replace advice given to you by your health care provider. Make sure you discuss any questions you have with your health care provider. Document Revised: 01/02/2018 Document Reviewed: 01/02/2018 Elsevier Patient Education  [place].

## 2019-06-11 NOTE — Progress Notes (Signed)
Chief Complaint  Patient presents with  . Follow-up   Fu  1. Peripheral neuropathy reviewed labs per neurology request but unable to add on B6 noted SPEP alpha + protein 0.4 elevated by 0.1 rec further w/u with CT ab/pelvis r/o internal cause I.e ovarian etiology with h/o DIU 2. DUB has f/u with ob/gyn due pap 01/2020 f/u sch [facility] since being on provera she has had some spotting   3. Depression slightly phq 9 score 9 and GAD 7 score 1 on prozac 40 mg qd  4. HTN controlled today on lis 30 mg qd hctz 25 mg qd  Congratulated pt she stopped Aleve since 03/2019 and had been taking chronically for year   5. Lymphedema with b/l stasis dermatitis legs flare and get red and itchy 1x per month x 5-6 days and TMC cream helps  6. Morbid obesity doing virtual chair exercises with her mom at  Home but reports she will start eating better eating off since the pandemic  7. C/o intermittent right neck pain mild to moderate no pain with ROM nothing tried and she thinks due to the way she walks with cane at times    Review of Systems  Constitutional: Negative for weight loss.  HENT: Negative for hearing loss.   Eyes: Negative for blurred vision.  Respiratory: Negative for shortness of breath.   Cardiovascular: Negative for chest pain.  Gastrointestinal: Negative for abdominal pain.  Musculoskeletal: Positive for back pain and neck pain.  Skin: Negative for itching and rash.  Neurological: Positive for sensory change.  Psychiatric/Behavioral: Positive for depression.   Past Medical History:  Diagnosis Date  . Abnormal menses   . Allergy   . Arthritis   . Back pain   . Chicken pox   . Depression   . Diabetes mellitus without complication ([place])   . Hypertension   . Joint pain   . Lymphedema   . Palpitations   . Peripheral neuropathy 09/30/2018  . Prediabetes   . Seasonal allergies   . Sleep apnea   . Stasis dermatitis of both legs    Past Surgical History:  Procedure  Laterality Date  . WISDOM TOOTH EXTRACTION     Family History  Problem Relation Age of Onset  . Arthritis Mother   . Depression Mother   . Hypertension Mother   . Thyroid disease Mother   . Obesity Mother   . Depression Father   . Parkinson's disease Father   . Dementia Father   . Obesity Father   . Arthritis Maternal Grandmother   . Heart disease Maternal Grandmother   . Hypertension Maternal Grandmother   . Hyperlipidemia Maternal Grandmother   . Stroke Maternal Grandmother   . Alcohol abuse Maternal Grandfather   . Arthritis Maternal Grandfather   . Depression Maternal Grandfather   . Hearing loss Maternal Grandfather   . Heart disease Maternal Grandfather   . Stroke Maternal Grandfather   . Arthritis Paternal Grandmother   . Arthritis Paternal Grandfather   . Depression Paternal Grandfather   . Heart disease Paternal Grandfather   . Hypertension Paternal Grandfather   . Stroke Paternal Grandfather   . Breast cancer Neg Hx    Social History   Socioeconomic History  . Marital status: Single    Spouse name: Not on file  . Number of children: Not on file  . Years of education: Not on file  . Highest education level: Not on file  Occupational History  . Occupation:  work from home, Designer, jewellery - [employer]  Tobacco Use  . Smoking status: Never Smoker  . Smokeless tobacco: Never Used  Substance and Sexual Activity  . Alcohol use: Not Currently    Alcohol/week: 0.0 standard drinks  . Drug use: Not Currently  . Sexual activity: Never  Other Topics Concern  . Not on file  Social History Narrative   Works for [employer] from home    No kids    No guns    Wears seat belt    Safe in relationship    Social Determinants of Health   Financial Resource Strain:   . Difficulty of Paying Living Expenses:   Food Insecurity:   . Worried About Charity fundraiser in the Last Year:   . Arboriculturist in the Last Year:   Transportation Needs:   . Lexicographer (Medical):   Belinda [facility] Lack of Transportation (Non-Medical):   Physical Activity:   . Days of Exercise per Week:   . Minutes of Exercise per Session:   Stress:   . Feeling of Stress :   Social Connections:   . Frequency of Communication with Friends and Family:   . Frequency of Social Gatherings with Friends and Family:   . Attends Religious Services:   . Active Member of Clubs or Organizations:   . Attends Archivist Meetings:   Belinda Garrett Kitchen Marital Status:   Intimate Partner Violence:   . Fear of Current or Ex-Partner:   . Emotionally Abused:   Belinda Garrett [facility] Physically Abused:   . Sexually Abused:    Current Meds  Medication Sig  . Cholecalciferol (VITAMIN D3) 1.25 MG (50000 UT) CAPS   . FLUoxetine (PROZAC) 20 MG tablet Take 2 tablets (40 mg total) by mouth daily.  Belinda Garrett [facility] gabapentin (NEURONTIN) 300 MG capsule Take 1 capsule (300 mg total) by mouth at bedtime.  Belinda Garrett [facility] glucosamine-chondroitin 500-400 MG tablet Take 1 tablet by mouth 3 (three) times daily.  . hydrochlorothiazide (HYDRODIURIL) 25 MG tablet Take 1 tablet (25 mg total) by mouth daily. In am  . lisinopril (PRINIVIL,ZESTRIL) 30 MG tablet Take 1 tablet (30 mg total) by mouth daily.  Belinda Garrett [facility] loratadine (CLARITIN) 10 MG tablet Take 10 mg by mouth daily.  . medroxyPROGESTERone (PROVERA) 2.5 MG tablet Take 1 tablet (2.5 mg total) by mouth daily.  . Multiple Vitamin (MULTIVITAMIN) capsule Take 1 capsule by mouth daily.  . Omega-3 Fatty Acids (FISH OIL) 1000 MG CAPS Take 1,000 mg by mouth daily.   Allergies  Allergen Reactions  . Dust Mite Extract   . Latex   . Pollen Extract    Recent Results (from the past 2160 hour(s))  Urinalysis, Routine w reflex microscopic     Status: Abnormal   Collection Time: 06/04/19  8:03 AM  Result Value Ref Range   Color, Urine YELLOW Yellow;Lt. Yellow;Straw;Dark Yellow;Amber;Green;Red;Brown   APPearance CLEAR Clear;Turbid;Slightly Cloudy;Cloudy   Specific Gravity, Urine 1.025 1.000 - 1.030   pH 6.0 5.0 -  8.0   Total Protein, Urine NEGATIVE Negative   Urine Glucose NEGATIVE Negative   Ketones, ur NEGATIVE Negative   Bilirubin Urine NEGATIVE Negative   Hgb urine dipstick TRACE-INTACT (A) Negative   Urobilinogen, UA 0.2 0.0 - 1.0   Leukocytes,Ua NEGATIVE Negative   Nitrite NEGATIVE Negative   WBC, UA 3-6/hpf (A) 0-2/hpf   RBC / HPF 0-2/hpf 0-2/hpf   Squamous Epithelial / LPF Few(5-10/hpf) (A) Rare(0-4/hpf)   Bacteria, UA Rare(<10/hpf) (A) None  TSH  Status: None   Collection Time: 06/04/19  8:03 AM  Result Value Ref Range   TSH 1.32 0.35 - 4.50 uIU/mL  Hemoglobin A1c     Status: None   Collection Time: 06/04/19  8:03 AM  Result Value Ref Range   Hgb A1c MFr Bld 6.0 4.6 - 6.5 %    Comment: Glycemic Control Guidelines for People with Diabetes:Non Diabetic:  <6%Goal of Therapy: <7%Additional Action Suggested:  >8%   Lipid panel     Status: Abnormal   Collection Time: 06/04/19  8:03 AM  Result Value Ref Range   Cholesterol 178 0 - 200 mg/dL    Comment: ATP III Classification       Desirable:  < 200 mg/dL               Borderline High:  200 - 239 mg/dL          High:  > = 240 mg/dL   Triglycerides 111.0 0.0 - 149.0 mg/dL    Comment: Normal:  <150 mg/dLBorderline High:  150 - 199 mg/dL   HDL 38.50 (L) >39.00 mg/dL   VLDL 22.2 0.0 - 40.0 mg/dL   LDL Cholesterol 117 (H) 0 - 99 mg/dL   Total CHOL/HDL Ratio 5     Comment:                Men          Women1/2 Average Risk     3.4          3.3Average Risk          5.0          4.42X Average Risk          9.6          7.13X Average Risk          15.0          11.0                       NonHDL 139.60     Comment: NOTE:  Non-HDL goal should be 30 mg/dL higher than patient's LDL goal (i.e. LDL goal of < 70 mg/dL, would have non-HDL goal of < 100 mg/dL)  CBC with Differential/Platelet     Status: Abnormal   Collection Time: 06/04/19  8:03 AM  Result Value Ref Range   WBC 8.7 4.0 - 10.5 K/uL   RBC 4.84 3.87 - 5.11 Mil/uL   Hemoglobin 12.5 12.0  - 15.0 g/dL   HCT 38.5 36.0 - 46.0 %   MCV 79.7 78.0 - 100.0 fl   MCHC 32.4 30.0 - 36.0 g/dL   RDW 16.9 (H) 11.5 - 15.5 %   Platelets 217.0 150.0 - 400.0 K/uL   Neutrophils Relative % 71.1 43.0 - 77.0 %   Lymphocytes Relative 20.7 12.0 - 46.0 %   Monocytes Relative 6.1 3.0 - 12.0 %   Eosinophils Relative 1.9 0.0 - 5.0 %   Basophils Relative 0.2 0.0 - 3.0 %   Neutro Abs 6.2 1.4 - 7.7 K/uL   Lymphs Abs 1.8 0.7 - 4.0 K/uL   Monocytes Absolute 0.5 0.1 - 1.0 K/uL   Eosinophils Absolute 0.2 0.0 - 0.7 K/uL   Basophils Absolute 0.0 0.0 - 0.1 K/uL  Comprehensive metabolic panel     Status: Abnormal   Collection Time: 06/04/19  8:03 AM  Result Value Ref Range   Sodium 137 135 - 145 mEq/L   Potassium 4.0  3.5 - 5.1 mEq/L   Chloride 102 96 - 112 mEq/L   CO2 25 19 - 32 mEq/L   Glucose, Bld 110 (H) 70 - 99 mg/dL   BUN 19 6 - 23 mg/dL   Creatinine, Ser 0.92 0.40 - 1.20 mg/dL   Total Bilirubin 0.8 0.2 - 1.2 mg/dL   Alkaline Phosphatase 51 39 - 117 U/L   AST 16 0 - 37 U/L   ALT 19 0 - 35 U/L   Total Protein 7.0 6.0 - 8.3 g/dL   Albumin 4.0 3.5 - 5.2 g/dL   GFR 64.55 >60.00 mL/min   Calcium 9.3 8.4 - 10.5 mg/dL  Urine Culture     Status: None   Collection Time: 06/04/19  8:04 AM   Specimen: Urine  Result Value Ref Range   MICRO NUMBER: [id number]    SPECIMEN QUALITY: Adequate    Sample Source NOT GIVEN    STATUS: FINAL    ISOLATE 1:      Growth of mixed flora was isolated, suggesting probable contamination. No further testing will be performed. If clinically indicated, recollection using a method to minimize contamination, with prompt transfer to Urine Culture Transport Tube, is  recommended.   Folate     Status: None   Collection Time: 06/04/19 11:44 AM  Result Value Ref Range   Folate 21.0 >5.9 ng/mL  Sedimentation rate     Status: Abnormal   Collection Time: 06/04/19 11:44 AM  Result Value Ref Range   Sed Rate 65 (H) 0 - 30 mm/hr  Vitamin D (25 hydroxy)     Status: None    Collection Time: 06/04/19 11:44 AM  Result Value Ref Range   VITD 38.28 30.00 - 100.00 ng/mL  B12     Status: None   Collection Time: 06/04/19 11:44 AM  Result Value Ref Range   Vitamin B-12 892 211 - 911 pg/mL  Protein Electrophoresis, (serum)     Status: Abnormal   Collection Time: 06/04/19 11:47 AM  Result Value Ref Range   Total Protein 6.4 6.1 - 8.1 g/dL   Albumin ELP 3.8 3.8 - 4.8 g/dL   Alpha 1 0.4 (H) 0.2 - 0.3 g/dL   Alpha 2 0.7 0.5 - 0.9 g/dL   Beta Globulin 0.5 0.4 - 0.6 g/dL   Beta 2 0.4 0.2 - 0.5 g/dL   Gamma Globulin 0.7 (L) 0.8 - 1.7 g/dL   SPE Interp.      Comment: . One or more serum protein fractions are outside the normal ranges.  No abnormal protein bands are apparent. .   Vitamin B1     Status: None   Collection Time: 06/04/19 11:52 AM  Result Value Ref Range   Vitamin B1 (Thiamine) 18 8 - 30 nmol/L    Comment: [facility] Vitamin supplementation within 24 hours prior to blood draw may affect the accuracy of the results. . This test was developed and its analytical performance characteristics have been determined by [place], [place]. It has not been cleared or approved by the U.S. Food and Drug Administration. This assay has been validated pursuant to the CLIA regulations and is used for clinical purposes. .    Objective  Body mass index is 87.18 kg/m. Wt Readings from Last 3 Encounters:  06/11/19 (!) 461 lb 6.4 oz (209.3 kg)  12/04/18 (!) 450 lb 9.6 oz (204.4 kg)  10/08/18 (!) 444 lb 3 oz (201.5 kg)   Temp Readings from Last 3 Encounters:  06/11/19 97.7 F (36.5 C) (Temporal)  12/04/18 98.8 F (37.1 C) (Oral)  03/25/18 98.4 F (36.9 C)   BP Readings from Last 3 Encounters:  06/11/19 124/78  12/04/18 132/82  10/08/18 136/77   Pulse Readings from Last 3 Encounters:  06/11/19 84  12/04/18 82  10/08/18 82    Physical Exam Vitals and nursing note reviewed.  Constitutional:      Appearance: Normal  appearance. She is well-developed and well-groomed. She is morbidly obese.  HENT:     Head: Normocephalic and atraumatic.  Eyes:     Conjunctiva/sclera: Conjunctivae normal.     Pupils: Pupils are equal, round, and reactive to light.  Cardiovascular:     Rate and Rhythm: Normal rate and regular rhythm.     Heart sounds: Normal heart sounds. No murmur.  Pulmonary:     Effort: Pulmonary effort is normal.     Breath sounds: Normal breath sounds.  Abdominal:     General: Abdomen is flat. Bowel sounds are normal.     Tenderness: There is no abdominal tenderness.    Skin:    General: Skin is warm and dry.     Comments: Chronic stasis dermatitis changes b/l lower legs no signs of infection    Neurological:     General: No focal deficit present.     Mental Status: She is alert and oriented to person, place, and time. Mental status is at baseline.     Gait: Gait normal.  Psychiatric:        Attention and Perception: Attention and perception normal.        Mood and Affect: Mood and affect normal.        Speech: Speech normal.        Behavior: Behavior normal. Behavior is cooperative.        Thought Content: Thought content normal.        Cognition and Memory: Cognition and memory normal.        Judgment: Judgment normal.     Assessment  Plan  Abnormal SPEP - Plan: CT ABDOMEN PELVIS W WO CONTRAST r/o GU etiology elevated alpha 1 protein with DUB  Venous stasis dermatitis of both lower extremities with chronic lymphedema  Lymphedema -consider vascular in future for unna boots  Cont tmc prn   Prediabetes Class 3 severe obesity with serious comorbidity and body mass index (BMI) greater than or equal to 70 in adult, unspecified obesity type ([place]) -rec healthy diet and exercise  -stopped metformin months ago will monitor for now  Depression, unspecified depression type -on prozac 40 mg qd   Peripheral polyneuropathy - Plan: Ambulatory referral to Neurosurgery and neurology Dr.  Manuella Garrett On alpha lipoic acid and gabapentin  Could consider scrambler therapy with neurology in future  Could consider B6, ANA in future, lyme  Results for Belinda Garrett, Belinda Garrett (MRN [id number]) as of 06/11/2019 18:52  Ref. Range 06/04/2019 11:44  Vitamin B12 Latest Ref Range: 211 - 911 pg/mL 892    Abnormal CT scan, lumbar spine - Plan: Ambulatory referral to Neurosurgery Numbness of toes - Plan: Ambulatory referral to Neurosurgery and Dr. Manuella Garrett  Cervicalgia - Plan: Ambulatory referral to Neurosurgery  Consider Xray pt wants to hold for now Essential hypertension -cont meds for now and monitor  rec try to avoid nsaids if she can  HM Flu shot utd  UTD Tdap  rec MMR and hep B vaccine pt to think about covid vx had 2/2   Declines STD testing  Pap 02/02/16 neg neg HPVf/u [facility] Dr. Guy Franco [facility] to see if needs to w/u DUB and pap due 02/02/2019 referral in and given to pt  6/28/19mammogramnegreferred previously overdue  Derm is Dr. Juanetta Gosling 2020  Colonoscopy consider in future age 37 y.o 02/2019 vs cologuard disc at f/u  Rec healthy diet and exercise   Provider: Dr. Olivia Mackie McLean-Scoc[facility]

## 2019-06-11 NOTE — Addendum Note (Signed)
Addended by: Orland Mustard on: 06/11/2019 07:28 PM   Modules accepted: Orders

## 2019-06-12 MED FILL — GABAPENTIN 300 MG CAPSULE: 300 | 90 days supply | Qty: 90 | Fill #0

## 2019-06-12 MED FILL — LISINOPRIL 30 MG TABLET: 30 | 90 days supply | Qty: 90 | Fill #0

## 2019-06-12 MED FILL — HYDROCHLOROTHIAZIDE 25 MG T: 25 | 90 days supply | Qty: 90 | Fill #0

## 2019-06-16 NOTE — Addendum Note (Signed)
Addended by: Orland Mustard on: 06/16/2019 10:09 AM   Modules accepted: Orders

## 2019-06-18 ENCOUNTER — Telehealth: Payer: Self-pay | Admitting: Internal Medicine

## 2019-06-18 NOTE — Telephone Encounter (Signed)
I called pt and left vm with appt time date location w/ instructions.

## 2019-06-23 ENCOUNTER — Ambulatory Visit: Payer: No Typology Code available for payment source

## 2019-06-25 ENCOUNTER — Encounter: Payer: Self-pay | Admitting: Internal Medicine

## 2019-06-27 ENCOUNTER — Ambulatory Visit
Admission: RE | Admit: 2019-06-27 | Discharge: 2019-06-27 | Disposition: A | Discharge status: Home / Self Care | Payer: No Typology Code available for payment source | Source: Ambulatory Visit | Attending: Internal Medicine | Admitting: Internal Medicine

## 2019-06-27 ENCOUNTER — Other Ambulatory Visit: Payer: Self-pay

## 2019-06-27 DIAGNOSIS — N938 Other specified abnormal uterine and vaginal bleeding: Secondary | ICD-10-CM | POA: Insufficient documentation

## 2019-06-27 DIAGNOSIS — G629 Polyneuropathy, unspecified: Secondary | ICD-10-CM | POA: Diagnosis present

## 2019-06-27 DIAGNOSIS — R778 Other specified abnormalities of plasma proteins: Secondary | ICD-10-CM | POA: Insufficient documentation

## 2019-06-27 DIAGNOSIS — I890 Lymphedema, not elsewhere classified: Secondary | ICD-10-CM | POA: Insufficient documentation

## 2019-06-27 DIAGNOSIS — N939 Abnormal uterine and vaginal bleeding, unspecified: Secondary | ICD-10-CM | POA: Diagnosis present

## 2019-06-27 MED ORDER — IOHEXOL 300 MG/ML  SOLN
100.0000 mL | Freq: Once | INTRAMUSCULAR | Status: AC | PRN
  Administered 2019-06-27: 100 mL via INTRAVENOUS

## 2019-07-02 ENCOUNTER — Encounter: Payer: Self-pay | Admitting: Internal Medicine

## 2019-07-02 ENCOUNTER — Telehealth (INDEPENDENT_AMBULATORY_CARE_PROVIDER_SITE_OTHER): Payer: No Typology Code available for payment source | Admitting: Internal Medicine

## 2019-07-02 VITALS — Ht 61.0 in | Wt >= 6400 oz

## 2019-07-02 DIAGNOSIS — G4733 Obstructive sleep apnea (adult) (pediatric): Secondary | ICD-10-CM

## 2019-07-02 DIAGNOSIS — M25561 Pain in right knee: Secondary | ICD-10-CM

## 2019-07-02 DIAGNOSIS — Z1283 Encounter for screening for malignant neoplasm of skin: Secondary | ICD-10-CM | POA: Diagnosis not present

## 2019-07-02 DIAGNOSIS — Z1231 Encounter for screening mammogram for malignant neoplasm of breast: Secondary | ICD-10-CM | POA: Diagnosis not present

## 2019-07-02 DIAGNOSIS — I872 Venous insufficiency (chronic) (peripheral): Secondary | ICD-10-CM | POA: Diagnosis not present

## 2019-07-02 DIAGNOSIS — G8929 Other chronic pain: Secondary | ICD-10-CM

## 2019-07-02 DIAGNOSIS — Z124 Encounter for screening for malignant neoplasm of cervix: Secondary | ICD-10-CM

## 2019-07-02 DIAGNOSIS — M25562 Pain in left knee: Secondary | ICD-10-CM

## 2019-07-02 DIAGNOSIS — Z9989 Dependence on other enabling machines and devices: Secondary | ICD-10-CM

## 2019-07-02 DIAGNOSIS — Z6841 Body Mass Index (BMI) 40.0 and over, adult: Secondary | ICD-10-CM

## 2019-07-02 MED ORDER — DICLOFENAC SODIUM 1 % EX GEL: 4.0000 g | Freq: Four times a day (QID) | CUTANEOUS | 11 refills | Status: DC

## 2019-07-02 NOTE — Progress Notes (Signed)
Virtual Visit via Video Note  I connected with Belinda Garrett  on 07/02/19 at  9:45 AM EDT by a video enabled telemedicine application and verified that I am speaking with the correct person using two identifiers.  Location patient: home Location provider:work or home office Persons participating in the virtual visit: patient, provider  I discussed the limitations of evaluation and management by telemedicine and the availability of in person appointments. The patient expressed understanding and agreed to proceed.   HPI:  1.chronic knee pain b/l daily 5-6/10 at times with activity worse uses icey hot and helps and affects walking takes tylenol 8hr and extra tylenol stopped aleve which she was on chronically. She reports no cartilage in her  Going out of town 5/6-5/11/21  2. Low back pain prn Tylenol and seeing PM&R pt starts 07/2019  ROS: See pertinent positives and negatives per HPI.  Past Medical History:  Diagnosis Date  . Abnormal menses   . Allergy   . Arthritis   . Back pain   . Chicken pox   . Depression   . Diabetes mellitus without complication ([place])   . Hypertension   . Joint pain   . Lymphedema   . Palpitations   . Peripheral neuropathy 09/30/2018  . Prediabetes   . Seasonal allergies   . Sleep apnea   . Stasis dermatitis of both legs     Past Surgical History:  Procedure Laterality Date  . WISDOM TOOTH EXTRACTION      Family History  Problem Relation Age of Onset  . Arthritis Mother   . Depression Mother   . Hypertension Mother   . Thyroid disease Mother   . Obesity Mother   . Depression Father   . Parkinson's disease Father   . Dementia Father   . Obesity Father   . Arthritis Maternal Grandmother   . Heart disease Maternal Grandmother   . Hypertension Maternal Grandmother   . Hyperlipidemia Maternal Grandmother   . Stroke Maternal Grandmother   . Alcohol abuse Maternal Grandfather   . Arthritis Maternal Grandfather   . Depression Maternal  Grandfather   . Hearing loss Maternal Grandfather   . Heart disease Maternal Grandfather   . Stroke Maternal Grandfather   . Arthritis Paternal Grandmother   . Arthritis Paternal Grandfather   . Depression Paternal Grandfather   . Heart disease Paternal Grandfather   . Hypertension Paternal Grandfather   . Stroke Paternal Grandfather   . Breast cancer Neg Hx     SOCIAL HX: lives alone   Current Outpatient Medications:  .  Alpha-Lipoic Acid (LIPOIC ACID PO), Take 600 mg by mouth daily., Disp: , Rfl:  .  B Complex Vitamins (VITAMIN B COMPLEX PO), Take by mouth., Disp: , Rfl:  .  Cholecalciferol (VITAMIN D3) 1.25 MG (50000 UT) CAPS, , Disp: , Rfl:  .  FLUoxetine (PROZAC) 20 MG tablet, Take 2 tablets (40 mg total) by mouth daily., Disp: 180 tablet, Rfl: 3 .  gabapentin (NEURONTIN) 300 MG capsule, Take 1 capsule (300 mg total) by mouth at bedtime., Disp: 90 capsule, Rfl: 3 .  glucosamine-chondroitin 500-400 MG tablet, Take 1 tablet by mouth 3 (three) times daily., Disp: , Rfl:  .  hydrochlorothiazide (HYDRODIURIL) 25 MG tablet, Take 1 tablet (25 mg total) by mouth daily. In am, Disp: 90 tablet, Rfl: 3 .  lisinopril (ZESTRIL) 30 MG tablet, Take 1 tablet (30 mg total) by mouth daily., Disp: 90 tablet, Rfl: 3 .  loratadine (CLARITIN) 10 MG  tablet, Take 10 mg by mouth daily., Disp: , Rfl:  .  medroxyPROGESTERone (PROVERA) 2.5 MG tablet, Take 1 tablet (2.5 mg total) by mouth daily., Disp: 30 tablet, Rfl: 11 .  Multiple Vitamin (MULTIVITAMIN) capsule, Take 1 capsule by mouth daily., Disp: , Rfl:  .  Omega-3 Fatty Acids (FISH OIL) 1000 MG CAPS, Take 1,000 mg by mouth daily., Disp: , Rfl:  .  diclofenac Sodium (VOLTAREN) 1 % GEL, Apply 4 g topically 4 (four) times daily., Disp: 100 g, Rfl: 11 .  triamcinolone cream (KENALOG) 0.1 %, Apply 1 application topically 2 (two) times daily. Prn (Patient not taking: Reported on 06/11/2019), Disp: 454 g, Rfl: 11  EXAM:  VITALS per patient if  applicable:  GENERAL: alert, oriented, appears well and in no acute distress  HEENT: atraumatic, conjunttiva clear, no obvious abnormalities on inspection of external nose and ears  NECK: normal movements of the head and neck  LUNGS: on inspection no signs of respiratory distress, breathing rate appears normal, no obvious gross SOB, gasping or wheezing  CV: no obvious cyanosis  MS: moves all visible extremities without noticeable abnormality  PSYCH/NEURO: pleasant and cooperative, no obvious depression or anxiety, speech and thought processing grossly intact  ASSESSMENT AND PLAN:  Discussed the following assessment and plan:  Chronic pain of both knees - Plan: Ambulatory referral to Orthopedic Surgery, diclofenac Sodium (VOLTAREN) 1 % GEL  Skin cancer screening - Plan: Ambulatory referral to Dermatology  Venous stasis dermatitis of both lower extremities - Plan: Ambulatory referral to Dermatology  Routine cervical smear - Plan: Ambulatory referral to Obstetrics / Gynecology   HM Flu shotutd  UTD Tdap  rec MMR and hep B vaccine pt to think about covid vx had 2/2   Declines STD testing  Pap 02/02/16 neg neg HPVf/u [facility] Dr. Guy Franco [facility] to see if needs to w/u DUB and pap due 02/02/2019 referral in and given to pt  6/28/19mammogramnegreferred previously overdue  Derm is Dr. Juanetta Gosling 2020 Colonoscopy consider in future age 26 y.o 02/2019 vs cologuard disc at f/u  Rec healthy diet and exercise  -we discussed possible serious and likely etiologies, options for evaluation and workup, limitations of telemedicine visit vs in person visit, treatment, treatment risks and precautions. Pt prefers to treat via telemedicine empirically rather then risking or undertaking an in person visit at this moment. Patient agrees to seek prompt in person care if worsening, new symptoms arise, or if is not improving with treatment.   I  discussed the assessment and treatment plan with the patient. The patient was provided an opportunity to ask questions and all were answered. The patient agreed with the plan and demonstrated an understanding of the instructions.   The patient was advised to call back or seek an in-person evaluation if the symptoms worsen or if the condition fails to improve as anticipated.   Nino Glow McLean-Scocuzza, MD

## 2019-07-22 ENCOUNTER — Ambulatory Visit: Payer: No Typology Code available for payment source

## 2019-08-04 MED FILL — FLUoxetine HCL 20 MG CAPS: 20 | 90 days supply | Qty: 180 | Fill #3

## 2019-08-04 MED FILL — MEDROXYPROGESTERONE 2.5 MG: 2.5 | 90 days supply | Qty: 90 | Fill #3

## 2019-08-07 ENCOUNTER — Ambulatory Visit: Payer: No Typology Code available for payment source

## 2019-08-28 ENCOUNTER — Encounter: Payer: Self-pay | Admitting: Internal Medicine

## 2019-09-03 MED FILL — LISINOPRIL 30 MG TABLET: 30 | 90 days supply | Qty: 90 | Fill #1

## 2019-09-03 MED FILL — GABAPENTIN 300 MG CAPSULE: 300 | 90 days supply | Qty: 90 | Fill #1

## 2019-09-03 MED FILL — HYDROCHLOROTHIAZIDE 25 MG T: 25 | 90 days supply | Qty: 90 | Fill #1

## 2019-09-09 ENCOUNTER — Encounter: Payer: Self-pay | Admitting: Internal Medicine

## 2019-10-05 ENCOUNTER — Encounter: Payer: Self-pay | Admitting: Internal Medicine

## 2019-10-06 DIAGNOSIS — Z6841 Body Mass Index (BMI) 40.0 and over, adult: Secondary | ICD-10-CM | POA: Insufficient documentation

## 2019-10-06 NOTE — Addendum Note (Signed)
Addended by: Orland Mustard on: 10/06/2019 11:00 AM   Modules accepted: Orders

## 2019-10-20 ENCOUNTER — Other Ambulatory Visit: Payer: Self-pay

## 2019-10-20 ENCOUNTER — Ambulatory Visit (INDEPENDENT_AMBULATORY_CARE_PROVIDER_SITE_OTHER): Payer: No Typology Code available for payment source | Admitting: Bariatrics

## 2019-10-20 ENCOUNTER — Encounter (INDEPENDENT_AMBULATORY_CARE_PROVIDER_SITE_OTHER): Payer: Self-pay | Admitting: Bariatrics

## 2019-10-20 VITALS — BP 116/67 | HR 91 | Temp 98.7°F | Ht 60.0 in | Wt >= 6400 oz

## 2019-10-20 DIAGNOSIS — I890 Lymphedema, not elsewhere classified: Secondary | ICD-10-CM

## 2019-10-20 DIAGNOSIS — I10 Essential (primary) hypertension: Secondary | ICD-10-CM | POA: Diagnosis not present

## 2019-10-20 DIAGNOSIS — R0602 Shortness of breath: Secondary | ICD-10-CM

## 2019-10-20 DIAGNOSIS — E7849 Other hyperlipidemia: Secondary | ICD-10-CM

## 2019-10-20 DIAGNOSIS — Z0289 Encounter for other administrative examinations: Secondary | ICD-10-CM

## 2019-10-20 DIAGNOSIS — R7303 Prediabetes: Secondary | ICD-10-CM | POA: Diagnosis not present

## 2019-10-20 DIAGNOSIS — M25561 Pain in right knee: Secondary | ICD-10-CM

## 2019-10-20 DIAGNOSIS — Z6841 Body Mass Index (BMI) 40.0 and over, adult: Secondary | ICD-10-CM

## 2019-10-20 DIAGNOSIS — E559 Vitamin D deficiency, unspecified: Secondary | ICD-10-CM

## 2019-10-20 DIAGNOSIS — Z9189 Other specified personal risk factors, not elsewhere classified: Secondary | ICD-10-CM

## 2019-10-20 DIAGNOSIS — F3289 Other specified depressive episodes: Secondary | ICD-10-CM

## 2019-10-20 DIAGNOSIS — G8929 Other chronic pain: Secondary | ICD-10-CM

## 2019-10-20 DIAGNOSIS — M25562 Pain in left knee: Secondary | ICD-10-CM

## 2019-10-20 DIAGNOSIS — R5383 Other fatigue: Secondary | ICD-10-CM | POA: Diagnosis not present

## 2019-10-20 NOTE — Progress Notes (Signed)
Chief Complaint:   Belinda Garrett (MR# [id number]) is a 49 y.o. female who presents for evaluation and treatment of Belinda and related comorbidities. Current BMI is Body mass index is 91.2 kg/m.[name] Belinda Garrett has been struggling with her weight for many years and has been unsuccessful in either losing weight, maintaining weight loss, or reaching her healthy weight goal.  Belinda Garrett is currently in the action stage of change and ready to dedicate time achieving and maintaining a healthier weight. Belinda Garrett is interested in becoming our patient and working on intensive lifestyle modifications including (but not limited to) diet and exercise for weight loss.  Belinda Garrett states that she likes to cook when she has energy. She craves carbohydrates.  Belinda Garrett's habits were reviewed today and are as follows: Her family eats meals together, her desired weight loss is 292 lbs, she has been heavy most of her life, her heaviest weight ever was 480 pounds, she craves ice cream, pizza, pasta, and [name] food, she frequently makes poor food choices, she frequently eats larger portions than normal and she struggles with emotional eating.  Depression Screen Belinda Garrett Food and Mood (modified PHQ-9) score was 20.  Depression screen PHQ 2/9 10/20/2019  Decreased Interest 3  Down, Depressed, Hopeless 3  PHQ - 2 Score 6  Altered sleeping 3  Tired, decreased energy 3  Change in appetite 2  Feeling bad or failure about yourself  2  Trouble concentrating 0  Moving slowly or fidgety/restless 3  Suicidal thoughts 1  PHQ-9 Score 20  Difficult doing work/chores Extremely dIfficult   Subjective:   Other fatigue. Belinda Garrett admits to daytime somnolence and admits to waking up still tired. Patent has a history of symptoms of daytime fatigue. Belinda Garrett generally gets 6.5-7 hours of sleep per night, and states that she has generally restful sleep. Snoring is not present with CPAP. Apneic episodes are not present. [place]  Sleepiness Score is 6.  Shortness of breath on exertion. Belinda Garrett notes increasing shortness of breath with certain activities and seems to be worsening over time with weight gain. She notes getting out of breath sooner with activity than she used to. This has gotten worse recently. Belinda Garrett denies shortness of breath at rest or orthopnea.  Essential hypertension. Belinda Garrett is taking lisinopril and HCTZ. Blood pressure is stable and controlled.  BP Readings from Last 3 Encounters:  10/20/19 116/67  06/11/19 124/78  12/04/18 132/82   Lab Results  Component Value Date   CREATININE 0.92 06/04/2019   CREATININE 1.00 09/25/2018   CREATININE 0.91 12/24/2017   Prediabetes. Belinda Garrett has a diagnosis of prediabetes based on her elevated HgA1c and was informed this puts her at greater risk of developing diabetes. She continues to work on diet and exercise to decrease her risk of diabetes. She denies nausea or hypoglycemia. Belinda Garrett is on no medication.  Lab Results  Component Value Date   HGBA1C 6.0 06/04/2019   Lab Results  Component Value Date   INSULIN 37.1 (H) 12/24/2017   Belinda Garrett. Belinda Garrett is noted in lower legs.  Chronic pain of both knees. Chronic bilateral knee pain interferes with activities.  Other hyperlipidemia. Belinda Garrett is on no medication.   Lab Results  Component Value Date   CHOL 178 06/04/2019   HDL 38.50 (L) 06/04/2019   LDLCALC 117 (H) 06/04/2019   TRIG 111.0 06/04/2019   CHOLHDL 5 06/04/2019   Lab Results  Component Value Date   ALT 19 06/04/2019   AST 16 06/04/2019   ALKPHOS  51 06/04/2019   BILITOT 0.8 06/04/2019   The 10-year ASCVD risk score Mikey Bussing DC Jr., et al., 2013) is: 3.5%   Values used to calculate the score:     Age: 41 years     Sex: Female     Is Non-Hispanic African American: No     Diabetic: Yes     Tobacco smoker: No     Systolic Blood Pressure: 696 mmHg     Is BP treated: Yes     HDL Cholesterol: 38.5 mg/dL     Total Cholesterol: 178  mg/dL  Vitamin D deficiency. Belinda Garrett is taking Vitamin D supplementation.    Ref. Range 06/04/2019 11:44  VITD Latest Ref Range: 30.00 - 100.00 ng/mL 38.28   Other depression, with emotional eating. Belinda Garrett is struggling with emotional eating and using food for comfort to the extent that it is negatively impacting her health. She has been working on behavior modification techniques to help reduce her emotional eating and has been somewhat successful. She shows no sign of suicidal or homicidal ideations. Belinda Garrett had a strongly positive depression screen with a PHQ-9 score of 20.  At risk for activity intolerance. Belinda Garrett is at risk of exercise intolerance due to chronic pain of both knees and Belinda.  Assessment/Plan:   Other fatigue. Belinda Garrett does feel that her weight is causing her energy to be lower than it should be. Fatigue may be related to Belinda, depression or many other causes. Labs will be ordered, and in the meanwhile, Belinda Garrett will focus on self care including making healthy food choices, increasing physical activity and focusing on stress reduction. EKG 12-Lead, T3, T4, free, TSH testing ordered today.  Shortness of breath on exertion. Belinda Garrett does feel that she gets out of breath more easily that she used to when she exercises. Belinda Garrett's shortness of breath appears to be Belinda related and exercise induced. She has agreed to work on weight loss and gradually increase exercise to treat her exercise induced shortness of breath. Will continue to monitor closely.  Essential hypertension. Belinda Garrett is working on healthy weight loss and exercise to improve blood pressure control. We will watch for signs of hypotension as she continues her lifestyle modifications. She will continue her medications as directed.   Prediabetes. Belinda Garrett will continue to work on weight loss, exercise, increasing healthy fats and protein, and decreasing simple carbohydrates to help decrease the risk of diabetes.   Comprehensive metabolic panel, Hemoglobin A1c, Insulin, random  Belinda Garrett. Belinda Garrett was instructed to elevate her legs above her heart during the day  Chronic pain of both knees. Belinda Garrett will gradually increase her activities. She will wear OTC patches on her knees.  Other hyperlipidemia. Cardiovascular risk and specific lipid/LDL goals reviewed.  We discussed several lifestyle modifications today and Venna will continue to work on diet, exercise and weight loss efforts. Orders and follow up as documented in patient record. Shante was instructed to decrease carbohydrates and increase PUFA's and MUFA's. Lipid Panel With LDL/HDL Ratio will be checked today.  Counseling Intensive lifestyle modifications are the first line treatment for this issue. . Dietary changes: Increase soluble fiber. Decrease simple carbohydrates. . Exercise changes: Moderate to vigorous-intensity aerobic activity 150 minutes per week if tolerated. . Lipid-lowering medications: see documented in medical record.   Vitamin D deficiency. Low Vitamin D level contributes to fatigue and are associated with Belinda, breast, and colon cancer. She agrees to continue to take Vitamin D as directed and VITAMIN D 25 Hydroxy (Vit-D Deficiency, Fractures) level  will be checked today.  Other depression, with emotional eating. Behavior modification techniques were discussed today to help Belinda Garrett deal with her emotional/non-hunger eating behaviors.  Orders and follow up as documented in patient record. 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  At risk for activity intolerance. Belinda Garrett was given approximately 15 minutes of exercise intolerance counseling today. She is 49 y.o. female and has risk factors exercise intolerance including Belinda. We discussed intensive lifestyle modifications today with an emphasis on specific weight loss instructions and strategies. [place] will slowly increase activity as tolerated.  Repetitive spaced learning was employed  today to elicit superior memory formation and behavioral change.  Class 3 severe Belinda with serious comorbidity and body mass index (BMI) greater than or equal to 70 in adult, unspecified Belinda type ([place]).  Lanyia is currently in the action stage of change and her goal is to continue with weight loss efforts. I recommend Emmaline begin the structured treatment plan as follows:  She has agreed to the Category 4 Plan.  She will work on meal planning, intentional eating, and will stop all sugary drinks.  We reviewed with the patient labs from 06/04/2019 including Vitamin D, B12, CBC, A1c, and TSH.  Exercise goals: All adults should avoid inactivity. Some physical activity is better than none, and adults who participate in any amount of physical activity gain some health benefits.   Behavioral modification strategies: increasing lean protein intake, decreasing simple carbohydrates, increasing vegetables, increasing water intake, decreasing eating out, no skipping meals, meal planning and cooking strategies, keeping healthy foods in the home and planning for success.  She was informed of the importance of frequent follow-up visits to maximize her success with intensive lifestyle modifications for her multiple health conditions. She was informed we would discuss her lab results at her next visit unless there is a critical issue that needs to be addressed sooner. Belinda Garrett agreed to keep her next visit at the agreed upon time to discuss these results.  Objective:   Blood pressure 116/67, pulse 91, temperature 98.7 F (37.1 C), height 5' (1.524 m), weight (!) 467 lb (211.8 kg), last menstrual period 10/06/2019, SpO2 97 %. Body mass index is 91.2 kg/m.  EKG: Sinus  Rhythm with a rate of 84 BPM. Low voltage in precordial leads. Poor R-wave progression - may be secondary to body habitus. Nonspecific T-abnormality. Otherwise normal.   Indirect Calorimeter completed today shows a VO2 of 468 and a REE of  3255.   General: Cooperative, alert, well developed, in no acute distress. HEENT: Conjunctivae and lids unremarkable. Cardiovascular: Regular rhythm.  Lungs: Normal work of breathing. Neurologic: No focal deficits.   Lab Results  Component Value Date   CREATININE 0.92 06/04/2019   BUN 19 06/04/2019   NA 137 06/04/2019   K 4.0 06/04/2019   CL 102 06/04/2019   CO2 25 06/04/2019   Lab Results  Component Value Date   ALT 19 06/04/2019   AST 16 06/04/2019   ALKPHOS 51 06/04/2019   BILITOT 0.8 06/04/2019   Lab Results  Component Value Date   HGBA1C 6.0 06/04/2019   HGBA1C 5.6 09/25/2018   HGBA1C 5.8 (H) 12/24/2017   HGBA1C 5.9 09/05/2017   Lab Results  Component Value Date   INSULIN 37.1 (H) 12/24/2017   Lab Results  Component Value Date   TSH 1.32 06/04/2019   Lab Results  Component Value Date   CHOL 178 06/04/2019   HDL 38.50 (L) 06/04/2019   LDLCALC 117 (H) 06/04/2019  TRIG 111.0 06/04/2019   CHOLHDL 5 06/04/2019   Lab Results  Component Value Date   WBC 8.7 06/04/2019   HGB 12.5 06/04/2019   HCT 38.5 06/04/2019   MCV 79.7 06/04/2019   PLT 217.0 06/04/2019   No results found for: IRON, TIBC, FERRITIN  Attestation Statements:   Reviewed by clinician on day of visit: allergies, medications, problem list, medical history, surgical history, family history, social history, and previous encounter notes.  Migdalia Dk, am acting as Location manager for [employer], DO   I have reviewed the above documentation for accuracy and completeness, and I agree with the above. Jearld Lesch, DO

## 2019-10-21 LAB — LIPID PANEL WITH LDL/HDL RATIO
Cholesterol, Total: 181 mg/dL (ref 100–199)
HDL: 43 mg/dL (ref >39)
LDL Chol Calc (NIH): 117 mg/dL — ABNORMAL HIGH (ref 0–99)
LDL/HDL Ratio: 2.7 ratio (ref 0.0–3.2)
Triglycerides: 116 mg/dL (ref 0–149)
VLDL Cholesterol Cal: 21 mg/dL (ref 5–40)

## 2019-10-21 LAB — T4, FREE: Free T4: 1.48 ng/dL (ref 0.82–1.77)

## 2019-10-21 LAB — VITAMIN D 25 HYDROXY (VIT D DEFICIENCY, FRACTURES): Vit D, 25-Hydroxy: 42.3 ng/mL (ref 30.0–100.0)

## 2019-10-21 LAB — COMPREHENSIVE METABOLIC PANEL
ALT: 20 IU/L (ref 0–32)
AST: 18 IU/L (ref 0–40)
Albumin/Globulin Ratio: 1.9 (ref 1.2–2.2)
Albumin: 4 g/dL (ref 3.8–4.8)
Alkaline Phosphatase: 61 IU/L (ref 48–121)
BUN/Creatinine Ratio: 16 (ref 9–23)
BUN: 15 mg/dL (ref 6–24)
Bilirubin Total: 0.6 mg/dL (ref 0.0–1.2)
CO2: 22 mmol/L (ref 20–29)
Calcium: 9.4 mg/dL (ref 8.7–10.2)
Chloride: 102 mmol/L (ref 96–106)
Creatinine, Ser: 0.92 mg/dL (ref 0.57–1.00)
GFR calc Af Amer: 84 mL/min/{1.73_m2} (ref >59)
GFR calc non Af Amer: 73 mL/min/{1.73_m2} (ref >59)
Globulin, Total: 2.1 g/dL (ref 1.5–4.5)
Glucose: 119 mg/dL — ABNORMAL HIGH (ref 65–99)
Potassium: 4.7 mmol/L (ref 3.5–5.2)
Sodium: 139 mmol/L (ref 134–144)
Total Protein: 6.1 g/dL (ref 6.0–8.5)

## 2019-10-21 LAB — TSH: TSH: 2.03 u[IU]/mL (ref 0.450–4.500)

## 2019-10-21 LAB — HEMOGLOBIN A1C
Est. average glucose Bld gHb Est-mCnc: 128 mg/dL
Hgb A1c MFr Bld: 6.1 % — ABNORMAL HIGH (ref 4.8–5.6)

## 2019-10-21 LAB — T3: T3, Total: 126 ng/dL (ref 71–180)

## 2019-10-21 LAB — INSULIN, RANDOM: INSULIN: 31.4 u[IU]/mL — ABNORMAL HIGH (ref 2.6–24.9)

## 2019-10-28 ENCOUNTER — Telehealth: Payer: Self-pay | Admitting: Internal Medicine

## 2019-10-28 NOTE — Telephone Encounter (Signed)
Rejection Reason - Patient Declined - Patient CX appt. Declined to R/S at this time. " [name] PA said about 5 hours ago

## 2019-10-29 ENCOUNTER — Other Ambulatory Visit: Payer: Self-pay | Admitting: Internal Medicine

## 2019-10-29 DIAGNOSIS — F32A Depression, unspecified: Secondary | ICD-10-CM

## 2019-10-29 MED ORDER — FLUOXETINE HCL 20 MG PO TABS: 40.0000 mg | ORAL_TABLET | Freq: Every day | ORAL | 3 refills | Status: DC

## 2019-10-29 MED FILL — FLUOXETINE HCL 20 MG TABS: 20 | 90 days supply | Qty: 180 | Fill #0

## 2019-11-03 ENCOUNTER — Ambulatory Visit (INDEPENDENT_AMBULATORY_CARE_PROVIDER_SITE_OTHER): Payer: No Typology Code available for payment source | Admitting: Bariatrics

## 2019-11-03 ENCOUNTER — Other Ambulatory Visit: Payer: Self-pay | Admitting: Certified Nurse Midwife

## 2019-11-03 ENCOUNTER — Other Ambulatory Visit: Payer: Self-pay

## 2019-11-03 ENCOUNTER — Encounter (INDEPENDENT_AMBULATORY_CARE_PROVIDER_SITE_OTHER): Payer: Self-pay | Admitting: Bariatrics

## 2019-11-03 VITALS — BP 144/82 | HR 89 | Temp 98.2°F | Ht 60.0 in | Wt >= 6400 oz

## 2019-11-03 DIAGNOSIS — Z9189 Other specified personal risk factors, not elsewhere classified: Secondary | ICD-10-CM

## 2019-11-03 DIAGNOSIS — M25562 Pain in left knee: Secondary | ICD-10-CM

## 2019-11-03 DIAGNOSIS — Z6841 Body Mass Index (BMI) 40.0 and over, adult: Secondary | ICD-10-CM

## 2019-11-03 DIAGNOSIS — R7303 Prediabetes: Secondary | ICD-10-CM

## 2019-11-03 DIAGNOSIS — M25561 Pain in right knee: Secondary | ICD-10-CM

## 2019-11-03 DIAGNOSIS — G8929 Other chronic pain: Secondary | ICD-10-CM

## 2019-11-03 MED ORDER — METFORMIN HCL 500 MG PO TABS
500.0000 mg | ORAL_TABLET | Freq: Every morning | ORAL | 0 refills | Status: DC
Start: 2019-11-03 — End: 2019-12-11

## 2019-11-03 MED FILL — MEDROXYPROGESTERONE 2.5 MG: 2.5 | 30 days supply | Qty: 30 | Fill #0

## 2019-11-05 ENCOUNTER — Encounter (INDEPENDENT_AMBULATORY_CARE_PROVIDER_SITE_OTHER): Payer: Self-pay | Admitting: Bariatrics

## 2019-11-05 NOTE — Progress Notes (Signed)
Chief Complaint:   OBESITY Belinda Garrett is here to discuss her progress with her obesity treatment plan along with follow-up of her obesity related diagnoses. Belinda Garrett is on the Category 4 Plan and states she is following her eating plan approximately 95% of the time. Belinda Garrett states she is exercising 0 minutes 0 times per week.  Today's visit was #: 8 Starting weight: 454 lbs Starting date: 12/24/2017 Today's weight: 456 lbs Today's date: 11/03/2019 Total lbs lost to date: 0 Total lbs lost since last in-office visit: 11  Interim History: Belinda Garrett is down 11 lbs since her last visit. She states it was not that hard to stay on the plan. She reports doing well with her water intake.  Subjective:   Prediabetes. Belinda Garrett has a diagnosis of prediabetes based on her elevated HgA1c and was informed this puts her at greater risk of developing diabetes. She continues to work on diet and exercise to decrease her risk of diabetes. She denies nausea or hypoglycemia. Belinda Garrett reports a rare increase in hunger.  Lab Results  Component Value Date   HGBA1C 6.1 (H) 10/20/2019   Lab Results  Component Value Date   INSULIN 31.4 (H) 10/20/2019   INSULIN 37.1 (H) 12/24/2017   Chronic pain of both knees. Belinda Garrett is taking Tylenol, Neurontin, and glucosamine-chondroitin.  At risk of diabetes mellitus. Belinda Garrett is at higher than average risk for developing diabetes due to prediabetes.  Assessment/Plan:   Prediabetes. Belinda Garrett will continue to work on weight loss, exercise, increasing healthy fats and protein, and decreasing simple carbohydrates to help decrease the risk of diabetes. Prescription was given for metformin 500 mg 1 in the a.m. with meal #30 with 0 refills.  Chronic pain of both knees. Belinda Garrett will continue her medications as directed.   At risk of diabetes mellitus. Belinda Garrett was given approximately 15 minutes of diabetes education and counseling today. We discussed intensive lifestyle modifications  today with an emphasis on weight loss as well as increasing exercise and decreasing simple carbohydrates in her diet. We also reviewed medication options with an emphasis on risk versus benefit of those discussed.   Repetitive spaced learning was employed today to elicit superior memory formation and behavioral change.  Class 3 severe obesity with serious comorbidity and body mass index (BMI) greater than or equal to 70 in adult, unspecified obesity type (Belinda Garrett).  Belinda Garrett is currently in the action stage of change. As such, her goal is to continue with weight loss efforts. She has agreed to the Category 4 Plan.   She will work on meal planning, intentional eating, and increasing fat.  We independently reviewed with the patient labs from 10/20/2019 including CMP, lipids, Vitamin D, A1c, insulin, and thyroid panel.  Exercise goals: All adults should avoid inactivity. Some physical activity is better than none, and adults who participate in any amount of physical activity gain some health benefits.  Behavioral modification strategies: increasing lean protein intake, decreasing simple carbohydrates, increasing vegetables, increasing water intake, decreasing eating out, no skipping meals, meal planning and cooking strategies, keeping healthy foods in the home and planning for success.  Belinda Garrett has agreed to follow-up with our clinic in 2 weeks. She was informed of the importance of frequent follow-up visits to maximize her success with intensive lifestyle modifications for her multiple health conditions.   Objective:   Blood pressure (!) 144/82, pulse 89, temperature 98.2 F (36.8 C), height 5' (1.524 m), weight (!) 456 lb (206.8 kg), last menstrual period 10/06/2019,  SpO2 95 %. Body mass index is 89.06 kg/m.  General: Cooperative, alert, well developed, in no acute distress. HEENT: Conjunctivae and lids unremarkable. Cardiovascular: Regular rhythm.  Lungs: Normal work of breathing. Neurologic:  No focal deficits.   Lab Results  Component Value Date   CREATININE 0.92 10/20/2019   BUN 15 10/20/2019   NA 139 10/20/2019   K 4.7 10/20/2019   CL 102 10/20/2019   CO2 22 10/20/2019   Lab Results  Component Value Date   ALT 20 10/20/2019   AST 18 10/20/2019   ALKPHOS 61 10/20/2019   BILITOT 0.6 10/20/2019   Lab Results  Component Value Date   HGBA1C 6.1 (H) 10/20/2019   HGBA1C 6.0 06/04/2019   HGBA1C 5.6 09/25/2018   HGBA1C 5.8 (H) 12/24/2017   HGBA1C 5.9 09/05/2017   Lab Results  Component Value Date   INSULIN 31.4 (H) 10/20/2019   INSULIN 37.1 (H) 12/24/2017   Lab Results  Component Value Date   TSH 2.030 10/20/2019   Lab Results  Component Value Date   CHOL 181 10/20/2019   HDL 43 10/20/2019   LDLCALC 117 (H) 10/20/2019   TRIG 116 10/20/2019   CHOLHDL 5 06/04/2019   Lab Results  Component Value Date   WBC 8.7 06/04/2019   HGB 12.5 06/04/2019   HCT 38.5 06/04/2019   MCV 79.7 06/04/2019   PLT 217.0 06/04/2019   No results found for: IRON, TIBC, FERRITIN  Attestation Statements:   Reviewed by clinician on day of visit: allergies, medications, problem list, medical history, surgical history, family history, social history, and previous encounter notes.  Migdalia Dk, am acting as Location manager for [employer], DO   I have reviewed the above documentation for accuracy and completeness, and I agree with the above. Jearld Lesch, DO

## 2019-11-11 MED FILL — METFORMIN HCL 500 MG TABS: 500 | 30 days supply | Qty: 30 | Fill #0

## 2019-11-18 ENCOUNTER — Other Ambulatory Visit: Payer: Self-pay

## 2019-11-18 ENCOUNTER — Ambulatory Visit (INDEPENDENT_AMBULATORY_CARE_PROVIDER_SITE_OTHER): Payer: No Typology Code available for payment source | Admitting: Bariatrics

## 2019-11-18 ENCOUNTER — Encounter (INDEPENDENT_AMBULATORY_CARE_PROVIDER_SITE_OTHER): Payer: Self-pay | Admitting: Bariatrics

## 2019-11-18 VITALS — BP 137/68 | HR 92 | Temp 98.4°F | Ht 60.0 in | Wt >= 6400 oz

## 2019-11-18 DIAGNOSIS — Z6841 Body Mass Index (BMI) 40.0 and over, adult: Secondary | ICD-10-CM

## 2019-11-18 DIAGNOSIS — E559 Vitamin D deficiency, unspecified: Secondary | ICD-10-CM | POA: Diagnosis not present

## 2019-11-18 DIAGNOSIS — I10 Essential (primary) hypertension: Secondary | ICD-10-CM

## 2019-11-19 ENCOUNTER — Encounter (INDEPENDENT_AMBULATORY_CARE_PROVIDER_SITE_OTHER): Payer: Self-pay | Admitting: Bariatrics

## 2019-11-19 NOTE — Progress Notes (Signed)
Chief Complaint:   OBESITY Belinda Garrett is here to discuss her progress with her obesity treatment plan along with follow-up of her obesity related diagnoses. Belinda Garrett is on the Category 4 Plan and states she is following her eating plan approximately 50% of the time. [name] states she is exercising 0 minutes 0 times per week.  Today's visit was #: 9 Starting weight: 454 lbs Starting date: 12/24/2017 Today's weight: 450 lbs Today's date: 11/18/2019 Total lbs lost to date: 4 Total lbs lost since last in-office visit: 6  Interim History: Belinda Garrett is down 6 lbs and doing well overall. She states that she has been making better choices and doing well with her water intake.   Subjective:   Essential hypertension. [name] is taking HCTZ and Zestril. Blood pressure is controlled.  BP Readings from Last 3 Encounters:  11/18/19 137/68  11/03/19 (!) 144/82  10/20/19 116/67   Lab Results  Component Value Date   CREATININE 0.92 10/20/2019   CREATININE 0.92 06/04/2019   CREATININE 1.00 09/25/2018   Vitamin D deficiency. Belinda Garrett is taking OTC Vitamin D supplementation.    Ref. Range 10/20/2019 12:45  Vitamin D, 25-Hydroxy Latest Ref Range: 30.0 - 100.0 ng/mL 42.3   Assessment/Plan:   Essential hypertension. Belinda Garrett is working on healthy weight loss and exercise to improve blood pressure control. We will watch for signs of hypotension as she continues her lifestyle modifications. She will continue her medications as directed.   Vitamin D deficiency. Low Vitamin D level contributes to fatigue and are associated with obesity, breast, and colon cancer. She agrees to continue to take Vitamin D as directed and will follow-up for routine testing of Vitamin D, at least 2-3 times per year to avoid over-replacement.  Class 3 severe obesity with serious comorbidity and body mass index (BMI) greater than or equal to 70 in adult, unspecified obesity type ([place]).  Belinda Garrett is currently in the action  stage of change. As such, her goal is to continue with weight loss efforts. She has agreed to the Category 4 Plan.   She will work on meal planning and being more adherent to the meal plan.  Exercise goals: Belinda Garrett will begin chair exercises.  Behavioral modification strategies: increasing lean protein intake, decreasing simple carbohydrates, increasing vegetables, increasing water intake, decreasing eating out, no skipping meals, meal planning and cooking strategies, keeping healthy foods in the home and planning for success.  Belinda Garrett has agreed to follow-up with our clinic in 2 weeks. She was informed of the importance of frequent follow-up visits to maximize her success with intensive lifestyle modifications for her multiple health conditions.   Objective:   Blood pressure 137/68, pulse 92, temperature 98.4 F (36.9 C), height 5' (1.524 m), weight (!) 450 lb (204.1 kg), SpO2 95 %. Body mass index is 87.88 kg/m.  General: Cooperative, alert, well developed, in no acute distress. HEENT: Conjunctivae and lids unremarkable. Cardiovascular: Regular rhythm.  Lungs: Normal work of breathing. Neurologic: No focal deficits.   Lab Results  Component Value Date   CREATININE 0.92 10/20/2019   BUN 15 10/20/2019   NA 139 10/20/2019   K 4.7 10/20/2019   CL 102 10/20/2019   CO2 22 10/20/2019   Lab Results  Component Value Date   ALT 20 10/20/2019   AST 18 10/20/2019   ALKPHOS 61 10/20/2019   BILITOT 0.6 10/20/2019   Lab Results  Component Value Date   HGBA1C 6.1 (H) 10/20/2019   HGBA1C 6.0 06/04/2019  HGBA1C 5.6 09/25/2018   HGBA1C 5.8 (H) 12/24/2017   HGBA1C 5.9 09/05/2017   Lab Results  Component Value Date   INSULIN 31.4 (H) 10/20/2019   INSULIN 37.1 (H) 12/24/2017   Lab Results  Component Value Date   TSH 2.030 10/20/2019   Lab Results  Component Value Date   CHOL 181 10/20/2019   HDL 43 10/20/2019   LDLCALC 117 (H) 10/20/2019   TRIG 116 10/20/2019   CHOLHDL 5  06/04/2019   Lab Results  Component Value Date   WBC 8.7 06/04/2019   HGB 12.5 06/04/2019   HCT 38.5 06/04/2019   MCV 79.7 06/04/2019   PLT 217.0 06/04/2019   No results found for: IRON, TIBC, FERRITIN  Attestation Statements:   Reviewed by clinician on day of visit: allergies, medications, problem list, medical history, surgical history, family history, social history, and previous encounter notes.  Time spent on visit including pre-visit chart review and post-visit charting and care was 20 minutes.   Migdalia Dk, am acting as Location manager for [employer], DO   I have reviewed the above documentation for accuracy and completeness, and I agree with the above. Jearld Lesch, DO

## 2019-11-29 ENCOUNTER — Encounter: Payer: Self-pay | Admitting: Internal Medicine

## 2019-12-01 NOTE — Telephone Encounter (Signed)
Patient last seen 07/02/19. Okay for referral or does she need an appointment?

## 2019-12-02 ENCOUNTER — Encounter: Payer: Self-pay | Admitting: Internal Medicine

## 2019-12-02 DIAGNOSIS — G4733 Obstructive sleep apnea (adult) (pediatric): Secondary | ICD-10-CM | POA: Insufficient documentation

## 2019-12-02 NOTE — Addendum Note (Signed)
Addended by: Orland Mustard on: 12/02/2019 12:48 AM   Modules accepted: Orders

## 2019-12-04 ENCOUNTER — Ambulatory Visit (INDEPENDENT_AMBULATORY_CARE_PROVIDER_SITE_OTHER): Payer: No Typology Code available for payment source | Admitting: Bariatrics

## 2019-12-11 ENCOUNTER — Other Ambulatory Visit (INDEPENDENT_AMBULATORY_CARE_PROVIDER_SITE_OTHER): Payer: Self-pay | Admitting: Bariatrics

## 2019-12-11 MED FILL — MEDROXYPROGESTERONE 2.5 MG: 2.5 | 30 days supply | Qty: 30 | Fill #1

## 2019-12-11 MED FILL — HYDROCHLOROTHIAZIDE 25 MG T: 25 | 90 days supply | Qty: 90 | Fill #2

## 2019-12-11 MED FILL — GABAPENTIN 300 MG CAPSULE: 300 | 90 days supply | Qty: 90 | Fill #2

## 2019-12-11 MED FILL — LISINOPRIL 30 MG TABLET: 30 | 90 days supply | Qty: 90 | Fill #2

## 2019-12-11 MED FILL — METFORMIN HCL 500 MG TABS: 500 | 30 days supply | Qty: 30 | Fill #0

## 2019-12-15 ENCOUNTER — Ambulatory Visit (INDEPENDENT_AMBULATORY_CARE_PROVIDER_SITE_OTHER): Payer: No Typology Code available for payment source | Admitting: Bariatrics

## 2019-12-15 ENCOUNTER — Other Ambulatory Visit: Payer: Self-pay

## 2019-12-15 ENCOUNTER — Encounter (INDEPENDENT_AMBULATORY_CARE_PROVIDER_SITE_OTHER): Payer: Self-pay | Admitting: Bariatrics

## 2019-12-15 VITALS — BP 138/63 | HR 88 | Temp 98.4°F | Ht 60.0 in | Wt >= 6400 oz

## 2019-12-15 DIAGNOSIS — G8929 Other chronic pain: Secondary | ICD-10-CM

## 2019-12-15 DIAGNOSIS — R7303 Prediabetes: Secondary | ICD-10-CM

## 2019-12-15 DIAGNOSIS — M25561 Pain in right knee: Secondary | ICD-10-CM | POA: Diagnosis not present

## 2019-12-15 DIAGNOSIS — Z6841 Body Mass Index (BMI) 40.0 and over, adult: Secondary | ICD-10-CM

## 2019-12-15 DIAGNOSIS — M25562 Pain in left knee: Secondary | ICD-10-CM | POA: Diagnosis not present

## 2019-12-16 ENCOUNTER — Ambulatory Visit (INDEPENDENT_AMBULATORY_CARE_PROVIDER_SITE_OTHER): Payer: No Typology Code available for payment source | Admitting: Internal Medicine

## 2019-12-16 ENCOUNTER — Encounter: Payer: Self-pay | Admitting: Internal Medicine

## 2019-12-16 VITALS — BP 126/82 | HR 83 | Temp 98.2°F | Ht 60.0 in | Wt >= 6400 oz

## 2019-12-16 DIAGNOSIS — Z0000 Encounter for general adult medical examination without abnormal findings: Secondary | ICD-10-CM | POA: Diagnosis not present

## 2019-12-16 DIAGNOSIS — I872 Venous insufficiency (chronic) (peripheral): Secondary | ICD-10-CM

## 2019-12-16 DIAGNOSIS — R319 Hematuria, unspecified: Secondary | ICD-10-CM | POA: Diagnosis not present

## 2019-12-16 DIAGNOSIS — Z1283 Encounter for screening for malignant neoplasm of skin: Secondary | ICD-10-CM

## 2019-12-16 DIAGNOSIS — R7303 Prediabetes: Secondary | ICD-10-CM

## 2019-12-16 DIAGNOSIS — Z6841 Body Mass Index (BMI) 40.0 and over, adult: Secondary | ICD-10-CM

## 2019-12-16 DIAGNOSIS — Z23 Encounter for immunization: Secondary | ICD-10-CM

## 2019-12-16 DIAGNOSIS — L719 Rosacea, unspecified: Secondary | ICD-10-CM

## 2019-12-16 DIAGNOSIS — I10 Essential (primary) hypertension: Secondary | ICD-10-CM

## 2019-12-16 NOTE — Progress Notes (Signed)
Chief Complaint:   OBESITY Belinda Garrett is here to discuss her progress with her obesity treatment plan along with follow-up of her obesity related diagnoses. Belinda Garrett is on the Category 4 Plan and states she is following her eating plan approximately 25% of the time. Belinda Garrett states she is exercising for 0 minutes 0 times per week.  Today's visit was #: 10 Starting weight: 454 lbs Starting date: 12/24/2017 Today's weight: 454 lbs Today's date: 12/15/2019 Total lbs lost to date: 0 Total lbs lost since last in-office visit: 0  Interim History: Belinda Garrett is up 4 pounds.  She has had several deaths to deal with (family and friends).  Subjective:   1. Prediabetes Belinda Garrett has a diagnosis of prediabetes based on her elevated HgA1c and was informed this puts her at greater risk of developing diabetes. She continues to work on diet and exercise to decrease her risk of diabetes. She denies nausea or hypoglycemia.  She has a normal appetite.  Lab Results  Component Value Date   HGBA1C 6.1 (H) 10/20/2019   Lab Results  Component Value Date   INSULIN 31.4 (H) 10/20/2019   INSULIN 37.1 (H) 12/24/2017   2. Chronic pain of both knees Her pain is intermittent.  Assessment/Plan:   1. Prediabetes Belinda Garrett will continue to work on weight loss, exercise, and decreasing simple carbohydrates to help decrease the risk of diabetes.  Decrease carbohydrated and increase activity.  2. Chronic pain of both knees Cut out painful exercise and increase activities.  3. Class 3 severe obesity with serious comorbidity and body mass index (BMI) greater than or equal to 70 in adult, unspecified obesity type (HCC)  Belinda Garrett is currently in the action stage of change. As such, her goal is to continue with weight loss efforts. She has agreed to the Category 4 Plan.   She will work on meal planning and will adhere more closely to the plan.  Exercise goals: All adults should avoid inactivity. Some physical activity is  better than none, and adults who participate in any amount of physical activity gain some health benefits.  Behavioral modification strategies: increasing lean protein intake, decreasing simple carbohydrates, increasing vegetables, increasing water intake, decreasing eating out, no skipping meals, meal planning and cooking strategies, keeping healthy foods in the home and planning for success.  Belinda Garrett has agreed to follow-up with our clinic in 3 weeks. She was informed of the importance of frequent follow-up visits to maximize her success with intensive lifestyle modifications for her multiple health conditions.   Objective:   Blood pressure 138/63, pulse 88, temperature 98.4 F (36.9 C), temperature source Oral, height 5' (1.524 m), weight (!) 454 lb (205.9 kg), SpO2 95 %. Body mass index is 88.67 kg/m.  General: Cooperative, alert, well developed, in no acute distress. HEENT: Conjunctivae and lids unremarkable. Cardiovascular: Regular rhythm.  Lungs: Normal work of breathing. Neurologic: No focal deficits.  She is using a cane with ambulation.  Lab Results  Component Value Date   CREATININE 0.92 10/20/2019   BUN 15 10/20/2019   NA 139 10/20/2019   K 4.7 10/20/2019   CL 102 10/20/2019   CO2 22 10/20/2019   Lab Results  Component Value Date   ALT 20 10/20/2019   AST 18 10/20/2019   ALKPHOS 61 10/20/2019   BILITOT 0.6 10/20/2019   Lab Results  Component Value Date   HGBA1C 6.1 (H) 10/20/2019   HGBA1C 6.0 06/04/2019   HGBA1C 5.6 09/25/2018   HGBA1C 5.8 (H) 12/24/2017  HGBA1C 5.9 09/05/2017   Lab Results  Component Value Date   INSULIN 31.4 (H) 10/20/2019   INSULIN 37.1 (H) 12/24/2017   Lab Results  Component Value Date   TSH 2.030 10/20/2019   Lab Results  Component Value Date   CHOL 181 10/20/2019   HDL 43 10/20/2019   LDLCALC 117 (H) 10/20/2019   TRIG 116 10/20/2019   CHOLHDL 5 06/04/2019   Lab Results  Component Value Date   WBC 8.7 06/04/2019   HGB  12.5 06/04/2019   HCT 38.5 06/04/2019   MCV 79.7 06/04/2019   PLT 217.0 06/04/2019   Attestation Statements:   Reviewed by clinician on day of visit: allergies, medications, problem list, medical history, surgical history, family history, social history, and previous encounter notes.  Time spent on visit including pre-visit chart review and post-visit care and charting was 20 minutes.   I, Water quality scientist, CMA, am acting as Location manager for [employer], DO  I have reviewed the above documentation for accuracy and completeness, and I agree with the above. Jearld Lesch, DO

## 2019-12-16 NOTE — Progress Notes (Addendum)
Chief Complaint  Patient presents with  . Follow-up  . Immunizations    flu shot    Annual  1. Prediabetes on metformin 500  2. Neuropathy on gabapentin and alpha lipoic acid and neuropathy stable 3. Rosacea/stasis derm and skin check wants referral Dr. Evorn Gong  4. HTN hctz 25 mg bid lis 30 mg   Review of Systems  Constitutional: Negative for weight loss.  Eyes: Negative for blurred vision.  Respiratory: Negative for shortness of breath.   Cardiovascular: Negative for chest pain.  Gastrointestinal: Negative for abdominal pain.  Skin: Negative for rash.  Neurological: Negative for headaches.   Past Medical History:  Diagnosis Date  . Abnormal menses   . Allergy   . Arthritis   . Back pain   . Back pain   . Chicken pox   . Depression   . Diabetes mellitus without complication ([place])   . Hypertension   . Joint pain   . Joint pain   . Lymphedema   . Palpitations   . Peripheral neuropathy 09/30/2018  . Prediabetes   . Rosacea   . Seasonal allergies   . Sleep apnea   . Stasis dermatitis of both legs   . Vertigo    Past Surgical History:  Procedure Laterality Date  . WISDOM TOOTH EXTRACTION     Family History  Problem Relation Age of Onset  . Arthritis Mother   . Depression Mother   . Hypertension Mother   . Thyroid disease Mother   . Obesity Mother   . Depression Father   . Parkinson's disease Father   . Dementia Father   . Obesity Father   . Arthritis Maternal Grandmother   . Heart disease Maternal Grandmother   . Hypertension Maternal Grandmother   . Hyperlipidemia Maternal Grandmother   . Stroke Maternal Grandmother   . Alcohol abuse Maternal Grandfather   . Arthritis Maternal Grandfather   . Depression Maternal Grandfather   . Hearing loss Maternal Grandfather   . Heart disease Maternal Grandfather   . Stroke Maternal Grandfather   . Arthritis Paternal Grandmother   . Arthritis Paternal Grandfather   . Depression Paternal Grandfather   . Heart  disease Paternal Grandfather   . Hypertension Paternal Grandfather   . Stroke Paternal Grandfather   . Breast cancer Neg Hx    Social History   Socioeconomic History  . Marital status: Single    Spouse name: Not on file  . Number of children: Not on file  . Years of education: Not on file  . Highest education level: Not on file  Occupational History  . Occupation: work from home, Designer, jewellery - [employer]  Tobacco Use  . Smoking status: Never Smoker  . Smokeless tobacco: Never Used  Substance and Sexual Activity  . Alcohol use: Not Currently    Alcohol/week: 0.0 standard drinks  . Drug use: Not Currently  . Sexual activity: Never  Other Topics Concern  . Not on file  Social History Narrative   Works for [employer] from home    No kids    No guns    Wears seat belt    Safe in relationship    Social Determinants of Health   Financial Resource Strain:   . Difficulty of Paying Living Expenses: Not on file  Food Insecurity:   . Worried About Charity fundraiser in the Last Year: Not on file  . Ran Out of Food in the Last Year: Not on file  Transportation Needs:   . Film/video editor (Medical): Not on file  . Lack of Transportation (Non-Medical): Not on file  Physical Activity:   . Days of Exercise per Week: Not on file  . Minutes of Exercise per Session: Not on file  Stress:   . Feeling of Stress : Not on file  Social Connections:   . Frequency of Communication with Friends and Family: Not on file  . Frequency of Social Gatherings with Friends and Family: Not on file  . Attends Religious Services: Not on file  . Active Member of Clubs or Organizations: Not on file  . Attends Archivist Meetings: Not on file  . Marital Status: Not on file  Intimate Partner Violence:   . Fear of Current or Ex-Partner: Not on file  . Emotionally Abused: Not on file  . Physically Abused: Not on file  . Sexually Abused: Not on file   Current Meds  Medication Sig   . acetaminophen (TYLENOL) 500 MG tablet Take 500 mg by mouth every 6 (six) hours as needed.  . Alpha-Lipoic Acid (LIPOIC ACID PO) Take 600 mg by mouth daily.  . B Complex Vitamins (VITAMIN B COMPLEX PO) Take by mouth.  . Cholecalciferol (VITAMIN D3) 50 MCG (2000 UT) capsule Take 2,000 Units by mouth daily.  [name] FLUoxetine (PROZAC) 20 MG tablet Take 2 tablets (40 mg total) by mouth daily.  [name] gabapentin (NEURONTIN) 300 MG capsule Take 1 capsule (300 mg total) by mouth at bedtime.  [name] glucosamine-chondroitin 500-400 MG tablet Take 1 tablet by mouth 3 (three) times daily.  . hydrochlorothiazide (HYDRODIURIL) 25 MG tablet Take 1 tablet (25 mg total) by mouth daily. In am  . lisinopril (ZESTRIL) 30 MG tablet Take 1 tablet (30 mg total) by mouth daily.  [name] loratadine (CLARITIN) 10 MG tablet Take 10 mg by mouth daily.  . medroxyPROGESTERone (PROVERA) 2.5 MG tablet TAKE 1 TABLET (2.5 MG TOTAL) BY MOUTH DAILY.  . metFORMIN (GLUCOPHAGE) 500 MG tablet TAKE 1 TABLET (500 MG TOTAL) BY MOUTH IN THE MORNING WITH FOOD  . metroNIDAZOLE (METROGEL) 0.75 % gel Apply 1 application topically 2 (two) times daily.  . Multiple Vitamin (MULTIVITAMIN) capsule Take 1 capsule by mouth daily.  . Omega-3 Fatty Acids (FISH OIL) 1000 MG CAPS Take 1,000 mg by mouth daily.   Allergies  Allergen Reactions  . Bee Pollen Other (See Comments)  . Dust Mite Extract   . Latex   . Pollen Extract    Recent Results (from the past 2160 hour(s))  Comprehensive metabolic panel     Status: Abnormal   Collection Time: 10/20/19 12:45 PM  Result Value Ref Range   Glucose 119 (H) 65 - 99 mg/dL   BUN 15 6 - 24 mg/dL   Creatinine, Ser 0.92 0.57 - 1.00 mg/dL   GFR calc non Af Amer 73 >59 mL/min/1.73   GFR calc Af Amer 84 >59 mL/min/1.73    Comment: **[facility] currently reports eGFR in compliance with the current**   recommendations of the [employer]. [facility] will   update reporting as new guidelines are published from the  NKF-ASN   Task force.    BUN/Creatinine Ratio 16 9 - 23   Sodium 139 134 - 144 mmol/L   Potassium 4.7 3.5 - 5.2 mmol/L   Chloride 102 96 - 106 mmol/L   CO2 22 20 - 29 mmol/L   Calcium 9.4 8.7 - 10.2 mg/dL   Total Protein 6.1 6.0 - 8.5  g/dL   Albumin 4.0 3.8 - 4.8 g/dL   Globulin, Total 2.1 1.5 - 4.5 g/dL   Albumin/Globulin Ratio 1.9 1.2 - 2.2   Bilirubin Total 0.6 0.0 - 1.2 mg/dL   Alkaline Phosphatase 61 48 - 121 IU/L   AST 18 0 - 40 IU/L   ALT 20 0 - 32 IU/L  Hemoglobin A1c     Status: Abnormal   Collection Time: 10/20/19 12:45 PM  Result Value Ref Range   Hgb A1c MFr Bld 6.1 (H) 4.8 - 5.6 %    Comment:          Prediabetes: 5.7 - 6.4          Diabetes: >6.4          Glycemic control for adults with diabetes: <7.0    Est. average glucose Bld gHb Est-mCnc 128 mg/dL  Insulin, random     Status: Abnormal   Collection Time: 10/20/19 12:45 PM  Result Value Ref Range   INSULIN 31.4 (H) 2.6 - 24.9 uIU/mL  Lipid Panel With LDL/HDL Ratio     Status: Abnormal   Collection Time: 10/20/19 12:45 PM  Result Value Ref Range   Cholesterol, Total 181 100 - 199 mg/dL   Triglycerides 116 0 - 149 mg/dL   HDL 43 >39 mg/dL   VLDL Cholesterol Cal 21 5 - 40 mg/dL   LDL Chol Calc (NIH) 117 (H) 0 - 99 mg/dL   LDL/HDL Ratio 2.7 0.0 - 3.2 ratio    Comment:                                     LDL/HDL Ratio                                             Men  Women                               1/2 Avg.Risk  1.0    1.5                                   Avg.Risk  3.6    3.2                                2X Avg.Risk  6.2    5.0                                3X Avg.Risk  8.0    6.1   T3     Status: None   Collection Time: 10/20/19 12:45 PM  Result Value Ref Range   T3, Total 126 71 - 180 ng/dL  T4, free     Status: None   Collection Time: 10/20/19 12:45 PM  Result Value Ref Range   Free T4 1.48 0.82 - 1.77 ng/dL  TSH     Status: None   Collection Time: 10/20/19 12:45 PM  Result Value Ref Range    TSH 2.030 0.450 - 4.500 uIU/mL  VITAMIN D 25 Hydroxy (Vit-D Deficiency, Fractures)  Status: None   Collection Time: 10/20/19 12:45 PM  Result Value Ref Range   Vit D, 25-Hydroxy 42.3 30.0 - 100.0 ng/mL    Comment: Vitamin D deficiency has been defined by the [place] practice guideline as a level of serum 25-OH vitamin D less than 20 ng/mL (1,2). The Endocrine Society went on to further define vitamin D insufficiency as a level between 21 and 29 ng/mL (2). 1. IOM (Institute of Medicine). 2010. Dietary reference    intakes for calcium and D. [place]: The    [employer]. 2. Holick MF, Binkley Carlos, Bischoff-Ferrari HA, et al.    Evaluation, treatment, and prevention of vitamin D    deficiency: an Endocrine Society clinical practice    guideline. JCEM. 2011 Jul; 96(7):1911-30.    Objective  Body mass index is 90.23 kg/m. Wt Readings from Last 3 Encounters:  12/16/19 (!) 462 lb (209.6 kg)  12/15/19 (!) 454 lb (205.9 kg)  11/18/19 (!) 450 lb (204.1 kg)   Temp Readings from Last 3 Encounters:  12/16/19 98.2 F (36.8 C) (Oral)  12/15/19 98.4 F (36.9 C) (Oral)  11/18/19 98.4 F (36.9 C)   BP Readings from Last 3 Encounters:  12/16/19 126/82  12/15/19 138/63  11/18/19 137/68   Pulse Readings from Last 3 Encounters:  12/16/19 83  12/15/19 88  11/18/19 92    Physical Exam Vitals and nursing note reviewed.  Constitutional:      Appearance: Normal appearance. She is well-developed and well-groomed. She is morbidly obese.  HENT:     Head: Normocephalic and atraumatic.  Eyes:     Conjunctiva/sclera: Conjunctivae normal.     Pupils: Pupils are equal, round, and reactive to light.  Cardiovascular:     Rate and Rhythm: Normal rate and regular rhythm.     Heart sounds: Normal heart sounds. No murmur heard.   Pulmonary:     Effort: Pulmonary effort is normal.  Abdominal:     General: Abdomen is flat. Bowel sounds  are normal.     Tenderness: There is no abdominal tenderness.  Skin:    General: Skin is warm and dry.  Neurological:     General: No focal deficit present.     Mental Status: She is alert and oriented to person, place, and time. Mental status is at baseline.     Gait: Gait normal.     Comments: Walks with walker  Psychiatric:        Attention and Perception: Attention and perception normal.        Mood and Affect: Mood and affect normal.        Speech: Speech normal.        Behavior: Behavior normal. Behavior is cooperative.        Thought Content: Thought content normal.        Cognition and Memory: Cognition and memory normal.        Judgment: Judgment normal.     Assessment  Plan  Annual physical exam Needs flu shot - Plan: Flu Vaccine QUAD 6+ mos PF IM (Fluarix Quad PF) Flu shotutd given today UTD Tdap  rec MMR and hep B vaccine pt to think about covid vx had 2/2   Declines STD testing  Pap 02/02/16 neg neg HPVf/u [facility] Dr. Guy Franco to [facility] to see if needs to w/u DUB  Pap due 12/2019 ob/gyn  6/28/19mammogramnegreferred previously overdue  Reschedule   Derm is  Dr. Juanetta Gosling 2020  Colonoscopy consider in future age 10 y.o 02/2019 vs cologuard disc at f/u  -message me back when ready vs cologuard disc 12/16/19  Rec healthy diet and exercise    Hematuria, unspecified type -will see if ob/gyn can f/u ua at f/u was on cycle last urine  Venous stasis dermatitis of both lower extremities - Plan: Ambulatory referral to Dermatology  Rosacea - Plan: Ambulatory referral to Dermatology  Skin cancer screening - Plan: Ambulatory referral to Dermatology  Essential hypertension controlled Prediabetes Morbid obesity with BMI of 70 and over, adult ([place])  -cont meds rec healthy diet and exercise    Provider: Dr. Olivia Mackie McLean-Scocuzza-[facility] Medicine

## 2019-12-16 NOTE — Patient Instructions (Addendum)
wegovy weekly wt loss med  [place] nutrition meal plan ____  Message me back when ready for colonoscopy and where   Call for mammogram [place]     cologuard cpt code [phone]  Colonoscopy, Adult A colonoscopy is a procedure to look at the entire large intestine. This procedure is done using a long, thin, flexible tube that has a camera on the end. You may have a colonoscopy:  As a part of normal colorectal screening.  If you have certain symptoms, such as: ? A low number of red blood cells in your blood (anemia). ? Diarrhea that does not go away. ? Pain in your abdomen. ? Blood in your stool. A colonoscopy can help screen for and diagnose medical problems, including:  Tumors.  Extra tissue that grows where mucus forms (polyps).  Inflammation.  Areas of bleeding. Tell your health care provider about:  Any allergies you have.  All medicines you are taking, including vitamins, herbs, eye drops, creams, and over-the-counter medicines.  Any problems you or family members have had with anesthetic medicines.  Any blood disorders you have.  Any surgeries you have had.  Any medical conditions you have.  Any problems you have had with having bowel movements.  Whether you are pregnant or may be pregnant. What are the risks? Generally, this is a safe procedure. However, problems may occur, including:  Bleeding.  Damage to your intestine.  Allergic reactions to medicines given during the procedure.  Infection. This is rare. What happens before the procedure? Eating and drinking restrictions Follow instructions from your health care provider about eating or drinking restrictions, which may include:  A few days before the procedure: ? Follow a low-fiber diet. ? Avoid nuts, seeds, dried fruit, raw fruits, and vegetables.  1-3 days before the procedure: ? Eat only gelatin dessert or ice pops. ? Drink only clear liquids, such as water, clear juice, clear broth or  bouillon, black coffee or tea, or clear soft drinks or sports drinks. ? Avoid liquids that contain red or purple dye.  The day of the procedure: ? Do not eat solid foods. You may continue to drink clear liquids until up to 2 hours before the procedure. ? Do not eat or drink anything starting 2 hours before the procedure, or within the time period that your health care provider recommends. Bowel prep If you were prescribed a bowel prep to take by mouth (orally) to clean out your colon:  Take it as told by your health care provider. Starting the day before your procedure, you will need to drink a large amount of liquid medicine. The liquid will cause you to have many bowel movements of loose stool until your stool becomes almost clear or light green.  If your skin or the opening between the buttocks (anus) gets irritated from diarrhea, you may relieve the irritation using: ? Wipes with medicine in them, such as adult wet wipes with aloe and vitamin E. ? A product to soothe skin, such as petroleum jelly.  If you vomit while drinking the bowel prep: ? Take a break for up to 60 minutes. ? Begin the bowel prep again. ? Call your health care provider if you keep vomiting or you cannot take the bowel prep without vomiting.  To clean out your colon, you may also be given: ? Laxative medicines. These help you have a bowel movement. ? Instructions for enema use. An enema is liquid medicine injected into your rectum. Medicines Ask your health care  provider about:  Changing or stopping your regular medicines or supplements. This is especially important if you are taking iron supplements, diabetes medicines, or blood thinners.  Taking medicines such as aspirin and ibuprofen. These medicines can thin your blood. Do not take these medicines unless your health care provider tells you to take them.  Taking over-the-counter medicines, vitamins, herbs, and supplements. General instructions  Ask your  health care provider what steps will be taken to help prevent infection. These may include washing skin with a germ-killing soap.  Plan to have someone take you home from the hospital or clinic. What happens during the procedure?   An IV will be inserted into one of your veins.  You may be given one or more of the following: ? A medicine to help you relax (sedative). ? A medicine to numb the area (local anesthetic). ? A medicine to make you fall asleep (general anesthetic). This is rarely needed.  You will lie on your side with your knees bent.  The tube will: ? Have oil or gel put on it (be lubricated). ? Be inserted into your anus. ? Be gently eased through all parts of your large intestine.  Air will be sent into your colon to keep it open. This may cause some pressure or cramping.  Images will be taken with the camera and will appear on a screen.  A small tissue sample may be removed to be looked at under a microscope (biopsy). The tissue may be sent to a lab for testing if any signs of problems are found.  If small polyps are found, they may be removed and checked for cancer cells.  When the procedure is finished, the tube will be removed. The procedure may vary among health care providers and hospitals. What happens after the procedure?  Your blood pressure, heart rate, breathing rate, and blood oxygen level will be monitored until you leave the hospital or clinic.  You may have a small amount of blood in your stool.  You may pass gas and have mild cramping or bloating in your abdomen. This is caused by the air that was used to open your colon during the exam.  Do not drive for 24 hours after the procedure.  It is up to you to get the results of your procedure. Ask your health care provider, or the department that is doing the procedure, when your results will be ready. Summary  A colonoscopy is a procedure to look at the entire large intestine.  Follow instructions  from your health care provider about eating and drinking before the procedure.  If you were prescribed an oral bowel prep to clean out your colon, take it as told by your health care provider.  During the colonoscopy, a flexible tube with a camera on its end is inserted into the anus and then passed into the other parts of the large intestine. This information is not intended to replace advice given to you by your health care provider. Make sure you discuss any questions you have with your health care provider. Document Revised: 09/27/2018 Document Reviewed: 09/27/2018 Elsevier Patient Education  [place].

## 2019-12-17 ENCOUNTER — Encounter (INDEPENDENT_AMBULATORY_CARE_PROVIDER_SITE_OTHER): Payer: Self-pay | Admitting: Bariatrics

## 2019-12-24 ENCOUNTER — Encounter: Payer: Self-pay | Admitting: Internal Medicine

## 2019-12-28 ENCOUNTER — Other Ambulatory Visit: Payer: Self-pay | Admitting: Internal Medicine

## 2019-12-28 DIAGNOSIS — N3000 Acute cystitis without hematuria: Secondary | ICD-10-CM

## 2020-01-07 ENCOUNTER — Ambulatory Visit (INDEPENDENT_AMBULATORY_CARE_PROVIDER_SITE_OTHER): Payer: No Typology Code available for payment source | Admitting: Bariatrics

## 2020-01-14 ENCOUNTER — Encounter: Payer: Self-pay | Admitting: Certified Nurse Midwife

## 2020-01-14 ENCOUNTER — Ambulatory Visit (INDEPENDENT_AMBULATORY_CARE_PROVIDER_SITE_OTHER): Payer: No Typology Code available for payment source | Admitting: Certified Nurse Midwife

## 2020-01-14 ENCOUNTER — Other Ambulatory Visit: Payer: Self-pay

## 2020-01-14 ENCOUNTER — Other Ambulatory Visit: Payer: Self-pay | Admitting: Certified Nurse Midwife

## 2020-01-14 ENCOUNTER — Other Ambulatory Visit (HOSPITAL_COMMUNITY)
Admission: RE | Admit: 2020-01-14 | Discharge: 2020-01-14 | Disposition: A | Discharge status: Home / Self Care | Payer: No Typology Code available for payment source | Source: Ambulatory Visit | Attending: Certified Nurse Midwife | Admitting: Certified Nurse Midwife

## 2020-01-14 VITALS — BP 147/74 | HR 101 | Ht 60.0 in | Wt >= 6400 oz

## 2020-01-14 DIAGNOSIS — Z124 Encounter for screening for malignant neoplasm of cervix: Secondary | ICD-10-CM

## 2020-01-14 MED ORDER — MEDROXYPROGESTERONE ACETATE 2.5 MG PO TABS
2.5000 mg | ORAL_TABLET | Freq: Every day | ORAL | 11 refills | Status: DC
Start: 2020-01-14 — End: 2020-03-23

## 2020-01-14 MED FILL — MEDROXYPROGESTERONE 2.5 MG: 2.5 | 30 days supply | Qty: 30 | Fill #0

## 2020-01-14 NOTE — Progress Notes (Signed)
GYNECOLOGY ANNUAL PREVENTATIVE CARE ENCOUNTER NOTE  History:     Belinda Garrett is a 49 y.o. G0P0000 female here for a routine annual gynecologic exam.  Current complaints: none   Denies abnormal vaginal bleeding, discharge, pelvic pain, problems with intercourse or other gynecologic concerns.     Social Relationship:single Living: Work:none Exercise:none Smoke/Alcohol/drug PYP:PJKDTO  Gynecologic History No LMP recorded (lmp unknown). (Menstrual status: Irregular Periods). Contraception: provera Last Pap: 2017 Results were: normal with negative HPV Last mammogram: 09/14/2017. Results were: normal  Obstetric History OB History  Gravida Para Term Preterm AB Living  0 0 0 0 0 0  SAB TAB Ectopic Multiple Live Births  0 0 0 0 0    Past Medical History:  Diagnosis Date   Abnormal menses    Allergy    Arthritis    Back pain    Back pain    Chicken pox    Depression    Diabetes mellitus without complication (HCC)    Hypertension    Joint pain    Joint pain    Lymphedema    Palpitations    Peripheral neuropathy 09/30/2018   Prediabetes    Rosacea    Seasonal allergies    Sleep apnea    Stasis dermatitis of both legs    Vertigo     Past Surgical History:  Procedure Laterality Date   WISDOM TOOTH EXTRACTION      Current Outpatient Medications on File Prior to Visit  Medication Sig Dispense Refill   acetaminophen (TYLENOL) 500 MG tablet Take 500 mg by mouth every 6 (six) hours as needed.     Alpha-Lipoic Acid (LIPOIC ACID PO) Take 600 mg by mouth daily.     B Complex Vitamins (VITAMIN B COMPLEX PO) Take by mouth.     Cholecalciferol (VITAMIN D3) 50 MCG (2000 UT) capsule Take 2,000 Units by mouth daily.     FLUoxetine (PROZAC) 20 MG tablet Take 2 tablets (40 mg total) by mouth daily. 180 tablet 3   gabapentin (NEURONTIN) 300 MG capsule Take 1 capsule (300 mg total) by mouth at bedtime. 90 capsule 3   glucosamine-chondroitin 500-400  MG tablet Take 1 tablet by mouth 3 (three) times daily.     hydrochlorothiazide (HYDRODIURIL) 25 MG tablet Take 1 tablet (25 mg total) by mouth daily. In am 90 tablet 3   lisinopril (ZESTRIL) 30 MG tablet Take 1 tablet (30 mg total) by mouth daily. 90 tablet 3   loratadine (CLARITIN) 10 MG tablet Take 10 mg by mouth daily.     metFORMIN (GLUCOPHAGE) 500 MG tablet TAKE 1 TABLET (500 MG TOTAL) BY MOUTH IN THE MORNING WITH FOOD 30 tablet 0   metroNIDAZOLE (METROGEL) 0.75 % gel Apply 1 application topically 2 (two) times daily.     Multiple Vitamin (MULTIVITAMIN) capsule Take 1 capsule by mouth daily.     Omega-3 Fatty Acids (FISH OIL) 1000 MG CAPS Take 1,000 mg by mouth daily.     No current facility-administered medications on file prior to visit.    Allergies  Allergen Reactions   Bee Pollen Other (See Comments)   Dust Mite Extract    Latex    Pollen Extract     Social History:  reports that she has never smoked. She has never used smokeless tobacco. She reports previous alcohol use. She reports previous drug use.  Family History  Problem Relation Age of Onset   Arthritis Mother    Depression Mother  Hypertension Mother    Thyroid disease Mother    Obesity Mother    Depression Father    Parkinson's disease Father    Dementia Father    Obesity Father    Arthritis Maternal Grandmother    Heart disease Maternal Grandmother    Hypertension Maternal Grandmother    Hyperlipidemia Maternal Grandmother    Stroke Maternal Grandmother    Alcohol abuse Maternal Grandfather    Arthritis Maternal Grandfather    Depression Maternal Grandfather    Hearing loss Maternal Grandfather    Heart disease Maternal Grandfather    Stroke Maternal Grandfather    Arthritis Paternal Grandmother    Arthritis Paternal Grandfather    Depression Paternal Grandfather    Heart disease Paternal Grandfather    Hypertension Paternal Grandfather    Stroke Paternal  Grandfather    Breast cancer Neg Hx     The following portions of the patient's history were reviewed and updated as appropriate: allergies, current medications, past family history, past medical history, past social history, past surgical history and problem list.  Review of Systems Pertinent items noted in HPI and remainder of comprehensive ROS otherwise negative.  Physical Exam:  BP (!) 147/74    Pulse (!) 101    Ht 5' (1.524 m)    Wt (!) 459 lb 4 oz (208.3 kg)    LMP  (LMP Unknown)    BMI 89.69 kg/m  CONSTITUTIONAL: Well-developed, morbidly obese female in no acute distress.  HENT:  Normocephalic, atraumatic, External right and left ear normal. Oropharynx is clear and moist EYES: Conjunctivae and EOM are normal. Pupils are equal, round, and reactive to light. No scleral icterus.  NECK: Normal range of motion, supple, no masses.  Normal thyroid.  SKIN: Skin is warm and dry. No rash noted. Not diaphoretic. No erythema. No pallor. MUSCULOSKELETAL: Normal range of motion. No tenderness.  No cyanosis, clubbing, or edema.  2+ distal pulses. NEUROLOGIC: Alert and oriented to person, place, and time. Normal reflexes, muscle tone coordination.  PSYCHIATRIC: Normal mood and affect. Normal behavior. Normal judgment and thought content. CARDIOVASCULAR: Normal heart rate noted, regular rhythm RESPIRATORY: Clear to auscultation bilaterally. Effort and breath sounds normal, no problems with respiration noted. BREASTS: Symmetric in size. No masses, tenderness, skin changes, nipple drainage, or lymphadenopathy bilaterally.  ABDOMEN: Soft, no distention noted.  No tenderness, rebound or guarding.  PELVIC: Normal appearing external genitalia and urethral meatus; normal appearing vaginal mucosa and cervix.  No abnormal discharge noted.  Pap smear obtained. Contact bleeding present  Unable to assess uterine size due to body habitus., exam compromised due to body habitus no other palpable masses, no uterine  or adnexal tenderness.  .   Assessment and Plan:    1. Screening for cervical cancer - Cytology - PAP Pap:Will follow up results of pap smear and manage accordingly. Mammogram : ordered Colonoscopy: she declines states she will do next year.  Refills:provera Referral:none Routine preventative health maintenance measures emphasized. Please refer to After Visit Summary for other counseling recommendations.      Philip Aspen, CNM [facility]

## 2020-01-14 NOTE — Patient Instructions (Signed)

## 2020-01-16 LAB — CYTOLOGY - PAP
Comment: NEGATIVE
Diagnosis: NEGATIVE
High risk HPV: NEGATIVE

## 2020-01-20 ENCOUNTER — Encounter (INDEPENDENT_AMBULATORY_CARE_PROVIDER_SITE_OTHER): Payer: Self-pay | Admitting: Bariatrics

## 2020-01-20 ENCOUNTER — Other Ambulatory Visit: Payer: Self-pay

## 2020-01-20 ENCOUNTER — Ambulatory Visit (INDEPENDENT_AMBULATORY_CARE_PROVIDER_SITE_OTHER): Payer: No Typology Code available for payment source | Admitting: Bariatrics

## 2020-01-20 VITALS — BP 136/71 | HR 86 | Temp 98.7°F | Ht 60.0 in | Wt >= 6400 oz

## 2020-01-20 DIAGNOSIS — R7303 Prediabetes: Secondary | ICD-10-CM

## 2020-01-20 DIAGNOSIS — Z6841 Body Mass Index (BMI) 40.0 and over, adult: Secondary | ICD-10-CM

## 2020-01-20 DIAGNOSIS — I10 Essential (primary) hypertension: Secondary | ICD-10-CM | POA: Diagnosis not present

## 2020-01-21 ENCOUNTER — Encounter (INDEPENDENT_AMBULATORY_CARE_PROVIDER_SITE_OTHER): Payer: Self-pay | Admitting: Bariatrics

## 2020-01-21 NOTE — Progress Notes (Signed)
Chief Complaint:   OBESITY Belinda Garrett is here to discuss her progress with her obesity treatment plan along with follow-up of her obesity related diagnoses. Belinda Garrett is on the Category 4 Plan and states she is following her eating plan approximately 25% of the time. Belinda Garrett states she is exercising 0 minutes 0 times per week.  Today's visit was #: 11 Starting weight: 454 lbs Starting date: 12/24/2017 Today's weight: 456 lbs Today's date: 01/19/2020 Total lbs lost to date: 0 Total lbs lost since last in-office visit: 0  Interim History: Belinda Garrett is up 2 lbs. She has been under more stress and eating out occasionally.  Subjective:   Prediabetes. Belinda Garrett has a diagnosis of prediabetes based on her elevated HgA1c and was informed this puts her at greater risk of developing diabetes. She continues to work on diet and exercise to decrease her risk of diabetes. She denies nausea or hypoglycemia. Belinda Garrett is taking metformin.  Lab Results  Component Value Date   HGBA1C 6.1 (H) 10/20/2019   Lab Results  Component Value Date   INSULIN 31.4 (H) 10/20/2019   INSULIN 37.1 (H) 12/24/2017   Essential hypertension. Blood pressure is controlled.  BP Readings from Last 3 Encounters:  01/20/20 136/71  01/14/20 (!) 147/74  12/16/19 126/82   Lab Results  Component Value Date   CREATININE 0.92 10/20/2019   CREATININE 0.92 06/04/2019   CREATININE 1.00 09/25/2018   Assessment/Plan:   Prediabetes. Belinda Garrett will continue to work on weight loss, exercise, decreasing refined sugars and simple carbohydrates to help decrease the risk of diabetes.   Essential hypertension. Belinda Garrett is working on healthy weight loss and exercise to improve blood pressure control. We will watch for signs of hypotension as she continues her lifestyle modifications. She will continue her medication as directed.   Class 3 severe obesity with serious comorbidity and body mass index (BMI) greater than or equal to 70 in  adult, unspecified obesity type (Belinda Garrett).  Belinda Garrett is currently in the action stage of change. As such, her goal is to continue with weight loss efforts. She has agreed to the Category 4 Plan.   She will work on meal planning and intentional eating.   Exercise goals: All adults should avoid inactivity. Some physical activity is better than none, and adults who participate in any amount of physical activity gain some health benefits.  Behavioral modification strategies: increasing lean protein intake, decreasing simple carbohydrates, increasing vegetables, increasing water intake, decreasing eating out, no skipping meals, meal planning and cooking strategies, keeping healthy foods in the home and planning for success.  Belinda Garrett has agreed to follow-up with our clinic in 2-3 weeks. She was informed of the importance of frequent follow-up visits to maximize her success with intensive lifestyle modifications for her multiple health conditions.   Objective:   Blood pressure 136/71, pulse 86, temperature 98.7 F (37.1 C), height 5' (1.524 m), weight (!) 456 lb (206.8 kg), SpO2 98 %. Body mass index is 89.06 kg/m.  General: Cooperative, alert, well developed, in no acute distress. HEENT: Conjunctivae and lids unremarkable. Cardiovascular: Regular rhythm.  Lungs: Normal work of breathing. Neurologic: No focal deficits.   Lab Results  Component Value Date   CREATININE 0.92 10/20/2019   BUN 15 10/20/2019   NA 139 10/20/2019   K 4.7 10/20/2019   CL 102 10/20/2019   CO2 22 10/20/2019   Lab Results  Component Value Date   ALT 20 10/20/2019   AST 18 10/20/2019  ALKPHOS 61 10/20/2019   BILITOT 0.6 10/20/2019   Lab Results  Component Value Date   HGBA1C 6.1 (H) 10/20/2019   HGBA1C 6.0 06/04/2019   HGBA1C 5.6 09/25/2018   HGBA1C 5.8 (H) 12/24/2017   HGBA1C 5.9 09/05/2017   Lab Results  Component Value Date   INSULIN 31.4 (H) 10/20/2019   INSULIN 37.1 (H) 12/24/2017   Lab Results    Component Value Date   TSH 2.030 10/20/2019   Lab Results  Component Value Date   CHOL 181 10/20/2019   HDL 43 10/20/2019   LDLCALC 117 (H) 10/20/2019   TRIG 116 10/20/2019   CHOLHDL 5 06/04/2019   Lab Results  Component Value Date   WBC 8.7 06/04/2019   HGB 12.5 06/04/2019   HCT 38.5 06/04/2019   MCV 79.7 06/04/2019   PLT 217.0 06/04/2019   No results found for: IRON, TIBC, FERRITIN  Attestation Statements:   Reviewed by clinician on day of visit: allergies, medications, problem list, medical history, surgical history, family history, social history, and previous encounter notes.  Time spent on visit including pre-visit chart review and post-visit charting and care was 20 minutes.   Migdalia Dk, am acting as Location manager for [employer], DO   I have reviewed the above documentation for accuracy and completeness, and I agree with the above. Jearld Lesch, DO

## 2020-01-27 ENCOUNTER — Ambulatory Visit: Payer: No Typology Code available for payment source | Attending: Internal Medicine

## 2020-01-27 ENCOUNTER — Other Ambulatory Visit: Payer: Self-pay | Admitting: Internal Medicine

## 2020-01-27 DIAGNOSIS — Z23 Encounter for immunization: Secondary | ICD-10-CM

## 2020-01-27 NOTE — Progress Notes (Signed)
   Covid-19 Vaccination Clinic  Name:  KEVONA LUPINACCI    MRN: [id number] DOB: 02-Apr-1970  01/27/2020  Ms. Montilla was observed post Covid-19 immunization for 15 minutes without incident. She was provided with Vaccine Information Sheet and instruction to access the V-Safe system.   Ms. Siefken was instructed to call 911 with any severe reactions post vaccine: [name] Difficulty breathing  . Swelling of face and throat  . A fast heartbeat  . A bad rash all over body  . Dizziness and weakness

## 2020-01-29 ENCOUNTER — Other Ambulatory Visit (INDEPENDENT_AMBULATORY_CARE_PROVIDER_SITE_OTHER): Payer: Self-pay | Admitting: Bariatrics

## 2020-01-29 MED FILL — FLUOXETINE HCL 20 MG TABS: 20 | 90 days supply | Qty: 180 | Fill #1

## 2020-01-29 MED FILL — METFORMIN HCL 500 MG TABS: 500 | 30 days supply | Qty: 30 | Fill #0

## 2020-01-29 NOTE — Telephone Encounter (Signed)
Advise approval for refill

## 2020-02-03 ENCOUNTER — Encounter (INDEPENDENT_AMBULATORY_CARE_PROVIDER_SITE_OTHER): Payer: Self-pay | Admitting: Bariatrics

## 2020-02-03 ENCOUNTER — Ambulatory Visit (INDEPENDENT_AMBULATORY_CARE_PROVIDER_SITE_OTHER): Payer: No Typology Code available for payment source | Admitting: Bariatrics

## 2020-02-03 ENCOUNTER — Other Ambulatory Visit: Payer: Self-pay

## 2020-02-03 VITALS — BP 146/72 | HR 88 | Temp 98.5°F | Ht 60.0 in | Wt >= 6400 oz

## 2020-02-03 DIAGNOSIS — F3289 Other specified depressive episodes: Secondary | ICD-10-CM | POA: Diagnosis not present

## 2020-02-03 DIAGNOSIS — Z9189 Other specified personal risk factors, not elsewhere classified: Secondary | ICD-10-CM

## 2020-02-03 DIAGNOSIS — I10 Essential (primary) hypertension: Secondary | ICD-10-CM

## 2020-02-03 DIAGNOSIS — Z6841 Body Mass Index (BMI) 40.0 and over, adult: Secondary | ICD-10-CM

## 2020-02-03 MED ORDER — BUPROPION HCL ER (SR) 150 MG PO TB12: 150.0000 mg | ORAL_TABLET | Freq: Every day | ORAL | 0 refills | Status: DC

## 2020-02-03 MED FILL — BUPROPION HCL SR 150 MG TAB: 150 | 30 days supply | Qty: 30 | Fill #0

## 2020-02-09 ENCOUNTER — Encounter (INDEPENDENT_AMBULATORY_CARE_PROVIDER_SITE_OTHER): Payer: Self-pay | Admitting: Bariatrics

## 2020-02-09 NOTE — Progress Notes (Signed)
Chief Complaint:   OBESITY Belinda Garrett is here to discuss her progress with her obesity treatment plan along with follow-up of her obesity related diagnoses. Belinda Garrett is on the Category 4 Plan and states she is following her eating plan approximately 20% of the time. Belinda Garrett states she is exercising 0 minutes 0 times per week.  Today's visit was #: 12 Starting weight: 454 lbs Starting date: 12/24/2017 Today's weight: 458 lbs Today's date: 02/03/2020 Total lbs lost to date: 0 Total lbs lost since last in-office visit: 0  Interim History: Belinda Garrett is up 2 lbs. She has struggled over the last few weeks. She states she is not eating after 7:00 p.m.   Subjective:   Other depression, with emotional eating. Belinda Garrett is struggling with emotional eating and using food for comfort to the extent that it is negatively impacting her health. She has been working on behavior modification techniques to help reduce her emotional eating and has been somewhat successful. She shows no sign of suicidal or homicidal ideations. Belinda Garrett endorses emotional eating.  Essential hypertension. Belinda Garrett is taking HCTZ and Zestril and reports taking medications as directed.   BP Readings from Last 3 Encounters:  02/03/20 (!) 146/72  01/20/20 136/71  01/14/20 (!) 147/74   Lab Results  Component Value Date   CREATININE 0.92 10/20/2019   CREATININE 0.92 06/04/2019   CREATININE 1.00 09/25/2018   At risk for heart disease. Belinda Garrett is at a higher than average risk for cardiovascular disease due to hypertension.   Assessment/Plan:   Other depression, with emotional eating. Behavior modification techniques were discussed today to help Belinda Garrett deal with her emotional/non-hunger eating behaviors.  Orders and follow up as documented in patient record. Prescription was given for Wellbutrin 150 mg 1 PO daily #30 with 0 refills.  Essential hypertension. Belinda Garrett is working on healthy weight loss and exercise to improve blood  pressure control. We will watch for signs of hypotension as she continues her lifestyle modifications. She will continue her medications as directed.   At risk for heart disease. Belinda Garrett was given approximately 15 minutes of coronary artery disease prevention counseling today. She is 49 y.o. female and has risk factors for heart disease including obesity. We discussed intensive lifestyle modifications today with an emphasis on specific weight loss instructions and strategies.   Repetitive spaced learning was employed today to elicit superior memory formation and behavioral change.  Class 3 severe obesity with serious comorbidity and body mass index (BMI) greater than or equal to 70 in adult, unspecified obesity type (Belinda Garrett).  Belinda Garrett is currently in the action stage of change. As such, her goal is to continue with weight loss efforts. She has agreed to practicing portion control and making smarter food choices, such as increasing vegetables and decreasing simple carbohydrates.   She will increase her water and protein intake, make better choices, and will be as active as possible.  Exercise goals: All adults should avoid inactivity. Some physical activity is better than none, and adults who participate in any amount of physical activity gain some health benefits.  Behavioral modification strategies: increasing lean protein intake, decreasing simple carbohydrates, increasing vegetables, increasing water intake, decreasing eating out, no skipping meals, meal planning and cooking strategies, keeping healthy foods in the home and planning for success.  Belinda Garrett has agreed to follow-up with our clinic in 4 weeks. She was informed of the importance of frequent follow-up visits to maximize her success with intensive lifestyle modifications for her multiple health  conditions.   Objective:   Blood pressure (!) 146/72, pulse 88, temperature 98.5 F (36.9 C), height 5' (1.524 m), weight (!) 458 lb (207.7 kg),  SpO2 95 %. Body mass index is 89.45 kg/m.  General: Cooperative, alert, well developed, in no acute distress. HEENT: Conjunctivae and lids unremarkable. Cardiovascular: Regular rhythm.  Lungs: Normal work of breathing. Neurologic: No focal deficits.   Lab Results  Component Value Date   CREATININE 0.92 10/20/2019   BUN 15 10/20/2019   NA 139 10/20/2019   K 4.7 10/20/2019   CL 102 10/20/2019   CO2 22 10/20/2019   Lab Results  Component Value Date   ALT 20 10/20/2019   AST 18 10/20/2019   ALKPHOS 61 10/20/2019   BILITOT 0.6 10/20/2019   Lab Results  Component Value Date   HGBA1C 6.1 (H) 10/20/2019   HGBA1C 6.0 06/04/2019   HGBA1C 5.6 09/25/2018   HGBA1C 5.8 (H) 12/24/2017   HGBA1C 5.9 09/05/2017   Lab Results  Component Value Date   INSULIN 31.4 (H) 10/20/2019   INSULIN 37.1 (H) 12/24/2017   Lab Results  Component Value Date   TSH 2.030 10/20/2019   Lab Results  Component Value Date   CHOL 181 10/20/2019   HDL 43 10/20/2019   LDLCALC 117 (H) 10/20/2019   TRIG 116 10/20/2019   CHOLHDL 5 06/04/2019   Lab Results  Component Value Date   WBC 8.7 06/04/2019   HGB 12.5 06/04/2019   HCT 38.5 06/04/2019   MCV 79.7 06/04/2019   PLT 217.0 06/04/2019   No results found for: IRON, TIBC, FERRITIN  Attestation Statements:   Reviewed by clinician on day of visit: allergies, medications, problem list, medical history, surgical history, family history, social history, and previous encounter notes.  Migdalia Dk, am acting as Location manager for [employer], DO   I have reviewed the above documentation for accuracy and completeness, and I agree with the above. Jearld Lesch, DO

## 2020-03-02 ENCOUNTER — Ambulatory Visit (INDEPENDENT_AMBULATORY_CARE_PROVIDER_SITE_OTHER): Payer: No Typology Code available for payment source | Admitting: Bariatrics

## 2020-03-02 ENCOUNTER — Other Ambulatory Visit (INDEPENDENT_AMBULATORY_CARE_PROVIDER_SITE_OTHER): Payer: Self-pay | Admitting: Bariatrics

## 2020-03-02 ENCOUNTER — Other Ambulatory Visit: Payer: Self-pay

## 2020-03-02 ENCOUNTER — Encounter (INDEPENDENT_AMBULATORY_CARE_PROVIDER_SITE_OTHER): Payer: Self-pay | Admitting: Bariatrics

## 2020-03-02 VITALS — BP 145/82 | HR 94 | Temp 98.3°F | Ht 60.0 in | Wt >= 6400 oz

## 2020-03-02 DIAGNOSIS — Z9189 Other specified personal risk factors, not elsewhere classified: Secondary | ICD-10-CM | POA: Diagnosis not present

## 2020-03-02 DIAGNOSIS — R7303 Prediabetes: Secondary | ICD-10-CM

## 2020-03-02 DIAGNOSIS — Z6841 Body Mass Index (BMI) 40.0 and over, adult: Secondary | ICD-10-CM

## 2020-03-02 DIAGNOSIS — F3289 Other specified depressive episodes: Secondary | ICD-10-CM

## 2020-03-02 MED ORDER — BUPROPION HCL ER (SR) 200 MG PO TB12: 200.0000 mg | ORAL_TABLET | Freq: Every day | ORAL | 0 refills | Status: DC

## 2020-03-02 MED ORDER — METFORMIN HCL 500 MG PO TABS: 500.0000 mg | ORAL_TABLET | Freq: Every day | ORAL | 0 refills | Status: DC

## 2020-03-02 MED FILL — BUPROPION HCL SR 200 MG TAB: 200 | 30 days supply | Qty: 30 | Fill #0

## 2020-03-02 MED FILL — METFORMIN HCL 500 MG TABS: 500 | 30 days supply | Qty: 30 | Fill #0

## 2020-03-02 NOTE — Progress Notes (Signed)
Chief Complaint:   OBESITY Belinda Garrett is here to discuss her progress with her obesity treatment plan along with follow-up of her obesity related diagnoses. Belinda Garrett is on the Category 4 Plan and states she is following her eating plan approximately 20% of the time. Belinda Garrett states she is exercising 0 minutes 0 times per week.  Today's visit was #: 13 Starting weight: 454 lbs Starting date: 12/24/2017 Today's weight: 456 lbs Today's date: 03/02/2020 Total lbs lost to date: 0 Total lbs lost since last in-office visit: 2  Interim History: Belinda Garrett is down an additional 2 lbs since her last visit. She reports doing well with her water intake.  Subjective:   Other depression, with emotional eating. Belinda Garrett is struggling with emotional eating and using food for comfort to the extent that it is negatively impacting her health. She has been working on behavior modification techniques to help reduce her emotional eating and has been somewhat successful. She shows no sign of suicidal or homicidal ideations. Belinda Garrett endorses some cravings.  Prediabetes. Belinda Garrett has a diagnosis of prediabetes based on her elevated HgA1c and was informed this puts her at greater risk of developing diabetes. She continues to work on diet and exercise to decrease her risk of diabetes. She denies nausea or hypoglycemia. Belinda Garrett reports no change in appetite.  Lab Results  Component Value Date   HGBA1C 6.1 (H) 10/20/2019   Lab Results  Component Value Date   INSULIN 31.4 (H) 10/20/2019   INSULIN 37.1 (H) 12/24/2017   At risk of diabetes mellitus. Belinda Garrett is at higher than average risk for developing diabetes due to prediabetes.   Assessment/Plan:   Other depression, with emotional eating. Behavior modification techniques were discussed today to help [place] deal with her emotional/non-hunger eating behaviors.  Orders and follow up as documented in patient record. Prescription was given for Wellbutrin SR 200 mg 1 PO  daily #30 with 0 refills.  Prediabetes. Belinda Garrett will continue to work on weight loss, exercise, and decreasing simple carbohydrates to help decrease the risk of diabetes. Prescription was given for metformin 500 mg 1 in a.m. with food #30 with 0 refills.  At risk of diabetes mellitus. Belinda Garrett was given approximately 15 minutes of diabetes education and counseling today. We discussed intensive lifestyle modifications today with an emphasis on weight loss as well as increasing exercise and decreasing simple carbohydrates in her diet. We also reviewed medication options with an emphasis on risk versus benefit of those discussed.   Repetitive spaced learning was employed today to elicit superior memory formation and behavioral change.   Class 3 severe obesity with serious comorbidity and body mass index (BMI) greater than or equal to 70 in adult, unspecified obesity type ([place]).  Belinda Garrett is currently in the action stage of change. As such, her goal is to continue with weight loss efforts. She has agreed to the Category 4 Plan.   She will work on meal planning and will be more adherent to her meal plan.  Exercise goals: All adults should avoid inactivity. Some physical activity is better than none, and adults who participate in any amount of physical activity gain some health benefits.  Behavioral modification strategies: increasing lean protein intake, decreasing simple carbohydrates, increasing vegetables, increasing water intake, decreasing eating out, no skipping meals, meal planning and cooking strategies, keeping healthy foods in the home and planning for success.  Belinda Garrett has agreed to follow-up with our clinic in 3 weeks. She was informed of the importance  of frequent follow-up visits to maximize her success with intensive lifestyle modifications for her multiple health conditions.   Objective:   Blood pressure (!) 145/82, pulse 94, temperature 98.3 F (36.8 C), height 5' (1.524 m), weight (!)  456 lb (206.8 kg), SpO2 95 %. Body mass index is 89.06 kg/m.  General: Cooperative, alert, well developed, in no acute distress. HEENT: Conjunctivae and lids unremarkable. Cardiovascular: Regular rhythm.  Lungs: Normal work of breathing. Neurologic: No focal deficits. Pt uses a cane for ambulation.  Lab Results  Component Value Date   CREATININE 0.92 10/20/2019   BUN 15 10/20/2019   NA 139 10/20/2019   K 4.7 10/20/2019   CL 102 10/20/2019   CO2 22 10/20/2019   Lab Results  Component Value Date   ALT 20 10/20/2019   AST 18 10/20/2019   ALKPHOS 61 10/20/2019   BILITOT 0.6 10/20/2019   Lab Results  Component Value Date   HGBA1C 6.1 (H) 10/20/2019   HGBA1C 6.0 06/04/2019   HGBA1C 5.6 09/25/2018   HGBA1C 5.8 (H) 12/24/2017   HGBA1C 5.9 09/05/2017   Lab Results  Component Value Date   INSULIN 31.4 (H) 10/20/2019   INSULIN 37.1 (H) 12/24/2017   Lab Results  Component Value Date   TSH 2.030 10/20/2019   Lab Results  Component Value Date   CHOL 181 10/20/2019   HDL 43 10/20/2019   LDLCALC 117 (H) 10/20/2019   TRIG 116 10/20/2019   CHOLHDL 5 06/04/2019   Lab Results  Component Value Date   WBC 8.7 06/04/2019   HGB 12.5 06/04/2019   HCT 38.5 06/04/2019   MCV 79.7 06/04/2019   PLT 217.0 06/04/2019   No results found for: IRON, TIBC, FERRITIN  Attestation Statements:   Reviewed by clinician on day of visit: allergies, medications, problem list, medical history, surgical history, family history, social history, and previous encounter notes.  Migdalia Dk, am acting as Location manager for [employer], DO   I have reviewed the above documentation for accuracy and completeness, and I agree with the above. Jearld Lesch, DO

## 2020-03-23 ENCOUNTER — Other Ambulatory Visit: Payer: Self-pay | Admitting: Internal Medicine

## 2020-03-23 ENCOUNTER — Telehealth: Payer: Self-pay

## 2020-03-23 ENCOUNTER — Encounter: Payer: Self-pay | Admitting: Internal Medicine

## 2020-03-23 DIAGNOSIS — G629 Polyneuropathy, unspecified: Secondary | ICD-10-CM

## 2020-03-23 DIAGNOSIS — R937 Abnormal findings on diagnostic imaging of other parts of musculoskeletal system: Secondary | ICD-10-CM

## 2020-03-23 MED ORDER — MEDROXYPROGESTERONE ACETATE 2.5 MG PO TABS: 2.5000 mg | ORAL_TABLET | Freq: Every day | ORAL | 2 refills | Status: DC

## 2020-03-23 MED ORDER — GABAPENTIN 300 MG PO CAPS: 300.0000 mg | ORAL_CAPSULE | Freq: Every day | ORAL | 1 refills | Status: DC

## 2020-03-23 MED FILL — GABAPENTIN 300 MG CAPSULE: 300 | 90 days supply | Qty: 90 | Fill #0

## 2020-03-23 NOTE — Telephone Encounter (Signed)
Provera #90 x 2 refills sent to [facility] per patient request.

## 2020-03-25 MED FILL — MEDROXYPROGESTERONE 2.5 MG: 2.5 | 90 days supply | Qty: 90 | Fill #0

## 2020-03-29 ENCOUNTER — Ambulatory Visit (INDEPENDENT_AMBULATORY_CARE_PROVIDER_SITE_OTHER): Payer: No Typology Code available for payment source | Admitting: Bariatrics

## 2020-03-31 ENCOUNTER — Other Ambulatory Visit (INDEPENDENT_AMBULATORY_CARE_PROVIDER_SITE_OTHER): Payer: Self-pay | Admitting: Bariatrics

## 2020-03-31 ENCOUNTER — Telehealth (INDEPENDENT_AMBULATORY_CARE_PROVIDER_SITE_OTHER): Payer: Self-pay

## 2020-03-31 ENCOUNTER — Encounter (INDEPENDENT_AMBULATORY_CARE_PROVIDER_SITE_OTHER): Payer: Self-pay | Admitting: Bariatrics

## 2020-03-31 ENCOUNTER — Ambulatory Visit (INDEPENDENT_AMBULATORY_CARE_PROVIDER_SITE_OTHER): Payer: 59 | Admitting: Bariatrics

## 2020-03-31 ENCOUNTER — Other Ambulatory Visit: Payer: Self-pay

## 2020-03-31 VITALS — BP 118/75 | HR 98 | Temp 98.7°F | Ht 60.0 in | Wt >= 6400 oz

## 2020-03-31 DIAGNOSIS — R7303 Prediabetes: Secondary | ICD-10-CM

## 2020-03-31 DIAGNOSIS — Z6841 Body Mass Index (BMI) 40.0 and over, adult: Secondary | ICD-10-CM

## 2020-03-31 DIAGNOSIS — E559 Vitamin D deficiency, unspecified: Secondary | ICD-10-CM

## 2020-03-31 DIAGNOSIS — Z9189 Other specified personal risk factors, not elsewhere classified: Secondary | ICD-10-CM

## 2020-03-31 DIAGNOSIS — G4733 Obstructive sleep apnea (adult) (pediatric): Secondary | ICD-10-CM | POA: Diagnosis not present

## 2020-03-31 MED ORDER — METFORMIN HCL 500 MG PO TABS
500.0000 mg | ORAL_TABLET | Freq: Every day | ORAL | 0 refills | Status: DC
Start: 2020-03-31 — End: 2020-04-29

## 2020-03-31 MED FILL — METFORMIN HCL 500 MG TABS: 500 | 30 days supply | Qty: 30 | Fill #0

## 2020-03-31 NOTE — Telephone Encounter (Signed)
Call pharmacy,   I discussed with the patient that I may need to change the dose. Why do they need a 30 day supply ?

## 2020-03-31 NOTE — Telephone Encounter (Signed)
Please review

## 2020-03-31 NOTE — Telephone Encounter (Addendum)
Pharmacy called stating that pt needs 90 day tablets for metformin,  Please give pharmacy a call [facility] [phone].

## 2020-04-05 NOTE — Progress Notes (Unsigned)
Chief Complaint:   OBESITY Belinda Garrett is here to discuss her progress with her obesity treatment plan along with follow-up of her obesity related diagnoses. Belinda Garrett is on the Category 4 Plan and states she is following her eating plan approximately 0% of the time. Belinda Garrett states she is not exercising regularly at this time.  Today's visit was #: 14 Starting weight: 454 lbs Starting date: 12/24/2017 Today's weight: 454 lbs Today's date: 03/31/2020 Total lbs lost to date: 0 Total lbs lost since last in-office visit: 2 lbs  Interim History: Belinda Garrett is down 2 pounds since her last visit.  Her schedule is stabilizing.  Subjective:   1. Prediabetes [name] has a diagnosis of prediabetes based on her elevated HgA1c and was informed this puts her at greater risk of developing diabetes. She continues to work on diet and exercise to decrease her risk of diabetes. She denies nausea or hypoglycemia.  She is taking metformin.  Lab Results  Component Value Date   HGBA1C 6.1 (H) 10/20/2019   Lab Results  Component Value Date   INSULIN 31.4 (H) 10/20/2019   INSULIN 37.1 (H) 12/24/2017   2. Vitamin D deficiency Belinda Garrett's Vitamin D level was 42.3 on 10/20/2019. She is currently taking OTC vitamin D 2,000 IU each day. She denies nausea, vomiting or muscle weakness.  3. At risk for activity intolerance Belinda Garrett is at risk for activity intolerance due to obesity and weather changes.  Assessment/Plan:   1. Prediabetes Belinda Garrett will continue to work on weight loss, exercise, and decreasing simple carbohydrates to help decrease the risk of diabetes.  She will continue metformin 500 mg daily with breakfast.  2. Vitamin D deficiency Low Vitamin D level contributes to fatigue and are associated with obesity, breast, and colon cancer.  She will continue taking OTC vitamin D.  3. At risk for activity intolerance Belinda Garrett was given approximately 15 minutes of exercise intolerance counseling today. She is 50 y.o.  female and has risk factors exercise intolerance including obesity. We discussed intensive lifestyle modifications today with an emphasis on specific weight loss instructions and strategies. Belinda Garrett will slowly increase activity as tolerated.  Repetitive spaced learning was employed today to elicit superior memory formation and behavioral change.  4. Class 3 severe obesity with serious comorbidity and body mass index (BMI) greater than or equal to 70 in adult, unspecified obesity type (HCC)  [name] is currently in the action stage of change. As such, her goal is to continue with weight loss efforts. She has agreed to the Category 4 Plan.   She will work on meal planning and mindful eating.  Exercise goals: All adults should avoid inactivity. Some physical activity is better than none, and adults who participate in any amount of physical activity gain some health benefits.  Behavioral modification strategies: increasing lean protein intake, decreasing simple carbohydrates, increasing vegetables, increasing water intake, decreasing eating out, no skipping meals, meal planning and cooking strategies, keeping healthy foods in the home and planning for success.  Belinda Garrett has agreed to follow-up with our clinic in 2-3 weeks, fasting. She was informed of the importance of frequent follow-up visits to maximize her success with intensive lifestyle modifications for her multiple health conditions.   Objective:   Blood pressure 118/75, pulse 98, temperature 98.7 F (37.1 C), height 5' (1.524 m), weight (!) 454 lb (205.9 kg), SpO2 95 %. Body mass index is 88.67 kg/m.  General: Cooperative, alert, well developed, in no acute distress. HEENT: Conjunctivae and lids unremarkable.  Cardiovascular: Regular rhythm.  Lungs: Normal work of breathing. Neurologic: No focal deficits.   Lab Results  Component Value Date   CREATININE 0.92 10/20/2019   BUN 15 10/20/2019   NA 139 10/20/2019   K 4.7 10/20/2019    CL 102 10/20/2019   CO2 22 10/20/2019   Lab Results  Component Value Date   ALT 20 10/20/2019   AST 18 10/20/2019   ALKPHOS 61 10/20/2019   BILITOT 0.6 10/20/2019   Lab Results  Component Value Date   HGBA1C 6.1 (H) 10/20/2019   HGBA1C 6.0 06/04/2019   HGBA1C 5.6 09/25/2018   HGBA1C 5.8 (H) 12/24/2017   HGBA1C 5.9 09/05/2017   Lab Results  Component Value Date   INSULIN 31.4 (H) 10/20/2019   INSULIN 37.1 (H) 12/24/2017   Lab Results  Component Value Date   TSH 2.030 10/20/2019   Lab Results  Component Value Date   CHOL 181 10/20/2019   HDL 43 10/20/2019   LDLCALC 117 (H) 10/20/2019   TRIG 116 10/20/2019   CHOLHDL 5 06/04/2019   Lab Results  Component Value Date   WBC 8.7 06/04/2019   HGB 12.5 06/04/2019   HCT 38.5 06/04/2019   MCV 79.7 06/04/2019   PLT 217.0 06/04/2019   Attestation Statements:   Reviewed by clinician on day of visit: allergies, medications, problem list, medical history, surgical history, family history, social history, and previous encounter notes.  I, Water quality scientist, CMA, am acting as Location manager for [employer], DO  I have reviewed the above documentation for accuracy and completeness, and I agree with the above. Jearld Lesch, DO

## 2020-04-07 ENCOUNTER — Encounter (INDEPENDENT_AMBULATORY_CARE_PROVIDER_SITE_OTHER): Payer: Self-pay | Admitting: Bariatrics

## 2020-04-14 ENCOUNTER — Other Ambulatory Visit: Payer: Self-pay

## 2020-04-14 ENCOUNTER — Other Ambulatory Visit (INDEPENDENT_AMBULATORY_CARE_PROVIDER_SITE_OTHER): Payer: Self-pay | Admitting: Bariatrics

## 2020-04-14 ENCOUNTER — Encounter (INDEPENDENT_AMBULATORY_CARE_PROVIDER_SITE_OTHER): Payer: Self-pay | Admitting: Bariatrics

## 2020-04-14 ENCOUNTER — Ambulatory Visit (INDEPENDENT_AMBULATORY_CARE_PROVIDER_SITE_OTHER): Payer: 59 | Admitting: Bariatrics

## 2020-04-14 VITALS — BP 138/85 | HR 93 | Temp 98.1°F | Ht 60.0 in | Wt >= 6400 oz

## 2020-04-14 DIAGNOSIS — F5089 Other specified eating disorder: Secondary | ICD-10-CM | POA: Diagnosis not present

## 2020-04-14 DIAGNOSIS — Z9189 Other specified personal risk factors, not elsewhere classified: Secondary | ICD-10-CM | POA: Diagnosis not present

## 2020-04-14 DIAGNOSIS — Z6841 Body Mass Index (BMI) 40.0 and over, adult: Secondary | ICD-10-CM

## 2020-04-14 DIAGNOSIS — R7303 Prediabetes: Secondary | ICD-10-CM | POA: Diagnosis not present

## 2020-04-14 MED ORDER — OZEMPIC (0.25 OR 0.5 MG/DOSE) 2 MG/1.5ML ~~LOC~~ SOPN: 0.2500 mg | PEN_INJECTOR | SUBCUTANEOUS | 0 refills | Status: DC

## 2020-04-14 MED FILL — OZEMPIC 0.25 OR 0.5 MG/DOSE: 2 | 56 days supply | Qty: 2 | Fill #0

## 2020-04-15 ENCOUNTER — Other Ambulatory Visit (INDEPENDENT_AMBULATORY_CARE_PROVIDER_SITE_OTHER): Payer: Self-pay | Admitting: Bariatrics

## 2020-04-15 ENCOUNTER — Encounter (INDEPENDENT_AMBULATORY_CARE_PROVIDER_SITE_OTHER): Payer: Self-pay | Admitting: Bariatrics

## 2020-04-15 MED FILL — BUPROPION HCL SR 200 MG TAB: 200 | 30 days supply | Qty: 30 | Fill #0

## 2020-04-15 NOTE — Telephone Encounter (Signed)
Refill request and pt was seen yesterday

## 2020-04-15 NOTE — Progress Notes (Signed)
Chief Complaint:   OBESITY Belinda Garrett is here to discuss her progress with her obesity treatment plan along with follow-up of her obesity related diagnoses. Belinda Garrett is on the Category 4 Plan and states she is following her eating plan approximately 25% of the time. Belinda Garrett states she is not exercising regularly at this time.  Today's visit was #: 15 Starting weight: 454 lbs Starting date: 12/24/2017 Today's weight: 453 lbs Today's date: 04/14/2020 Total lbs lost to date: 1 lb Total lbs lost since last in-office visit: 1 lb  Interim History: Belinda Garrett is down 1 pound since her last visit.  Subjective:   1. Prediabetes Belinda Garrett has a diagnosis of prediabetes based on her elevated HgA1c and was informed this puts her at greater risk of developing diabetes. She continues to work on diet and exercise to decrease her risk of diabetes. She denies nausea or hypoglycemia.  She is taking metformin 500 mg daily.  Lab Results  Component Value Date   HGBA1C 6.1 (H) 10/20/2019   Lab Results  Component Value Date   INSULIN 31.4 (H) 10/20/2019   INSULIN 37.1 (H) 12/24/2017   2. Other disorder of eating Belinda Garrett is taking Wellbutrin SR.  She says she is doing well, but forgot to take her Wellbutrin for 1 week.  3. At risk for constipation Belinda Garrett is at increased risk for constipation due to taking Belinda Garrett. Belinda Garrett denies hard, infrequent stools currently.   Assessment/Plan:   1. Prediabetes Belinda Garrett will continue to work on weight loss, exercise, and decreasing simple carbohydrates to help decrease the risk of diabetes.  Continue metformin.  Start Belinda Garrett 0.25 mg subcutaneously weekly, as per below.  -Start Semaglutide,0.25 or 0.5MG /DOS, (Belinda Garrett, 0.25 OR 0.5 MG/DOSE,) 2 MG/1.5ML SOPN; Inject 0.25 mg into the skin once a week.  Dispense: 1.5 mL; Refill: 0  2. Other disorder of eating Continue Wellbutrin.  3. At risk for constipation Belinda Garrett was given approximately 15 minutes of counseling today  regarding prevention of constipation. She was encouraged to increase water and fiber intake.   4. Class 3 severe obesity with serious comorbidity and body mass index (BMI) greater than or equal to 70 in adult, unspecified obesity type (HCC)  Belinda Garrett is currently in the action stage of change. As such, her goal is to continue with weight loss efforts. She has agreed to the Category 4 Plan.   She will work on meal planing and mindful eating.  Exercise goals: All adults should avoid inactivity. Some physical activity is better than none, and adults who participate in any amount of physical activity gain some health benefits.  Behavioral modification strategies: increasing lean protein intake, decreasing simple carbohydrates, increasing vegetables, increasing water intake, decreasing eating out, no skipping meals, meal planning and cooking strategies, keeping healthy foods in the home and planning for success.  Belinda Garrett has agreed to follow-up with our clinic in 2 weeks, fasting. She was informed of the importance of frequent follow-up visits to maximize her success with intensive lifestyle modifications for her multiple health conditions.   Objective:   Blood pressure 138/85, pulse 93, temperature 98.1 F (36.7 C), height 5' (1.524 m), weight (!) 453 lb (205.5 kg), SpO2 94 %. Body mass index is 88.47 kg/m.  General: Cooperative, alert, well developed, in no acute distress. HEENT: Conjunctivae and lids unremarkable. Cardiovascular: Regular rhythm.  Lungs: Normal work of breathing. Neurologic: No focal deficits.   Lab Results  Component Value Date   CREATININE 0.92 10/20/2019   BUN 15 10/20/2019  NA 139 10/20/2019   K 4.7 10/20/2019   CL 102 10/20/2019   CO2 22 10/20/2019   Lab Results  Component Value Date   ALT 20 10/20/2019   AST 18 10/20/2019   ALKPHOS 61 10/20/2019   BILITOT 0.6 10/20/2019   Lab Results  Component Value Date   HGBA1C 6.1 (H) 10/20/2019   HGBA1C 6.0  06/04/2019   HGBA1C 5.6 09/25/2018   HGBA1C 5.8 (H) 12/24/2017   HGBA1C 5.9 09/05/2017   Lab Results  Component Value Date   INSULIN 31.4 (H) 10/20/2019   INSULIN 37.1 (H) 12/24/2017   Lab Results  Component Value Date   TSH 2.030 10/20/2019   Lab Results  Component Value Date   CHOL 181 10/20/2019   HDL 43 10/20/2019   LDLCALC 117 (H) 10/20/2019   TRIG 116 10/20/2019   CHOLHDL 5 06/04/2019   Lab Results  Component Value Date   WBC 8.7 06/04/2019   HGB 12.5 06/04/2019   HCT 38.5 06/04/2019   MCV 79.7 06/04/2019   PLT 217.0 06/04/2019   Attestation Statements:   Reviewed by clinician on day of visit: allergies, medications, problem list, medical history, surgical history, family history, social history, and previous encounter notes.  I, Water quality scientist, CMA, am acting as Location manager for [employer], DO  I have reviewed the above documentation for accuracy and completeness, and I agree with the above. Jearld Lesch, DO

## 2020-04-15 NOTE — Telephone Encounter (Signed)
Dr.Brown 

## 2020-04-28 MED FILL — FLUOXETINE HCL 20 MG TABS: 20 | 90 days supply | Qty: 180 | Fill #2

## 2020-04-29 ENCOUNTER — Ambulatory Visit (INDEPENDENT_AMBULATORY_CARE_PROVIDER_SITE_OTHER): Payer: 59 | Admitting: Bariatrics

## 2020-04-29 ENCOUNTER — Other Ambulatory Visit: Payer: Self-pay

## 2020-04-29 ENCOUNTER — Other Ambulatory Visit (INDEPENDENT_AMBULATORY_CARE_PROVIDER_SITE_OTHER): Payer: Self-pay | Admitting: Bariatrics

## 2020-04-29 ENCOUNTER — Encounter (INDEPENDENT_AMBULATORY_CARE_PROVIDER_SITE_OTHER): Payer: Self-pay | Admitting: Bariatrics

## 2020-04-29 VITALS — BP 136/80 | HR 90 | Temp 98.3°F | Ht 60.0 in | Wt >= 6400 oz

## 2020-04-29 DIAGNOSIS — Z6841 Body Mass Index (BMI) 40.0 and over, adult: Secondary | ICD-10-CM | POA: Diagnosis not present

## 2020-04-29 DIAGNOSIS — Z9189 Other specified personal risk factors, not elsewhere classified: Secondary | ICD-10-CM | POA: Diagnosis not present

## 2020-04-29 DIAGNOSIS — E538 Deficiency of other specified B group vitamins: Secondary | ICD-10-CM

## 2020-04-29 DIAGNOSIS — F3289 Other specified depressive episodes: Secondary | ICD-10-CM

## 2020-04-29 DIAGNOSIS — E559 Vitamin D deficiency, unspecified: Secondary | ICD-10-CM | POA: Diagnosis not present

## 2020-04-29 DIAGNOSIS — R7303 Prediabetes: Secondary | ICD-10-CM

## 2020-04-29 MED ORDER — METFORMIN HCL 500 MG PO TABS: 500.0000 mg | ORAL_TABLET | Freq: Every day | ORAL | 0 refills | Status: DC

## 2020-04-29 MED FILL — METFORMIN HCL 500 MG TABS: 500 | 90 days supply | Qty: 90 | Fill #0

## 2020-04-30 LAB — COMPREHENSIVE METABOLIC PANEL
ALT: 29 IU/L (ref 0–32)
AST: 19 IU/L (ref 0–40)
Albumin/Globulin Ratio: 1.7 (ref 1.2–2.2)
Albumin: 4.2 g/dL (ref 3.8–4.9)
Alkaline Phosphatase: 72 IU/L (ref 44–121)
BUN/Creatinine Ratio: 14 (ref 9–23)
BUN: 15 mg/dL (ref 6–24)
Bilirubin Total: 0.8 mg/dL (ref 0.0–1.2)
CO2: 21 mmol/L (ref 20–29)
Calcium: 9.2 mg/dL (ref 8.7–10.2)
Chloride: 102 mmol/L (ref 96–106)
Creatinine, Ser: 1.07 mg/dL — ABNORMAL HIGH (ref 0.57–1.00)
GFR calc Af Amer: 69 mL/min/{1.73_m2} (ref >59)
GFR calc non Af Amer: 60 mL/min/{1.73_m2} (ref >59)
Globulin, Total: 2.5 g/dL (ref 1.5–4.5)
Glucose: 93 mg/dL (ref 65–99)
Potassium: 4.4 mmol/L (ref 3.5–5.2)
Sodium: 141 mmol/L (ref 134–144)
Total Protein: 6.7 g/dL (ref 6.0–8.5)

## 2020-04-30 LAB — LIPID PANEL WITH LDL/HDL RATIO
Cholesterol, Total: 180 mg/dL (ref 100–199)
HDL: 38 mg/dL — ABNORMAL LOW (ref >39)
LDL Chol Calc (NIH): 122 mg/dL — ABNORMAL HIGH (ref 0–99)
LDL/HDL Ratio: 3.2 ratio (ref 0.0–3.2)
Triglycerides: 107 mg/dL (ref 0–149)
VLDL Cholesterol Cal: 20 mg/dL (ref 5–40)

## 2020-04-30 LAB — VITAMIN B12: Vitamin B-12: 919 pg/mL (ref 232–1245)

## 2020-04-30 LAB — VITAMIN D 25 HYDROXY (VIT D DEFICIENCY, FRACTURES): Vit D, 25-Hydroxy: 57.3 ng/mL (ref 30.0–100.0)

## 2020-04-30 LAB — HEMOGLOBIN A1C
Est. average glucose Bld gHb Est-mCnc: 123 mg/dL
Hgb A1c MFr Bld: 5.9 % — ABNORMAL HIGH (ref 4.8–5.6)

## 2020-04-30 LAB — INSULIN, RANDOM: INSULIN: 17.3 u[IU]/mL (ref 2.6–24.9)

## 2020-05-01 DIAGNOSIS — G4733 Obstructive sleep apnea (adult) (pediatric): Secondary | ICD-10-CM | POA: Diagnosis not present

## 2020-05-03 ENCOUNTER — Encounter: Payer: Self-pay | Admitting: Internal Medicine

## 2020-05-03 NOTE — Progress Notes (Unsigned)
Chief Complaint:   OBESITY Belinda Garrett is here to discuss her progress with her obesity treatment plan along with follow-up of her obesity related diagnoses. Belinda Garrett is on the Category 4 Plan and states she is following her eating plan approximately 45% of the time. Belinda Garrett states she is doing 0 minutes 0 times per week.  Today's visit was #: 16 Starting weight: 454 lbs Starting date: 12/24/2017 Today's weight: 448 lbs Today's date: 04/29/2020 Total lbs lost to date: 6 Total lbs lost since last in-office visit: 5  Interim History: Belinda Garrett is down 5 lbs since her last visit. She started on Ozempic at the last visit for pre-diabetes. She is trying more.  Subjective:   1. Pre-diabetes Belinda Garrett is taking metformin and Ozempic. Last labs were done on 10/20/2019.  2. Other depression, with emotional eating Belinda Garrett is currently taking Wellbutrin SR 200 mg daily.  3. Vitamin D deficiency Belinda Garrett is taking Vit D3, and last Vit D level was 42.3.  4. B12 deficiency Belinda Garrett is taking complex B12 and multivitamins, and she is also on metformin. Last B12 level was 892.  5. At risk for activity intolerance Belinda Garrett is at risk for exercise intolerance due to obesity and weather.  Assessment/Plan:   1. Pre-diabetes Belinda Garrett will continue to work on weight loss, exercise, and decreasing simple carbohydrates to help decrease the risk of diabetes. We will check labs today, and we will refill metformin for 90 days with no refills.  - metFORMIN (GLUCOPHAGE) 500 MG tablet; Take 1 tablet (500 mg total) by mouth daily with breakfast.  Dispense: 90 tablet; Refill: 0 - Insulin, random - Hemoglobin A1c - Lipid Panel With LDL/HDL Ratio - Comprehensive metabolic panel  2. Other depression, with emotional eating Behavior modification techniques were discussed today to help Belinda Garrett deal with her emotional/non-hunger eating behaviors. Belinda Garrett will continue Wellbutrin SR. Orders and follow up as documented in patient  record.   3. Vitamin D deficiency Low Vitamin D level contributes to fatigue and are associated with obesity, breast, and colon cancer. We will check labs today. Belinda Garrett will follow-up for routine testing of Vitamin D, at least 2-3 times per year to avoid over-replacement.  - VITAMIN D 25 Hydroxy (Vit-D Deficiency, Fractures)  4. B12 deficiency The diagnosis was reviewed with the patient. Counseling provided today, see below. We will check labs today, and we will continue to monitor. Orders and follow up as documented in patient record.  Counseling . The body needs vitamin B12: to make red blood cells; to make DNA; and to help the nerves work properly so they can carry messages from the brain to the body.  . The main causes of vitamin B12 deficiency include dietary deficiency, digestive diseases, pernicious anemia, and having a surgery in which part of the stomach or small intestine is removed.  . Certain medicines can make it harder for the body to absorb vitamin B12. These medicines include: heartburn medications; some antibiotics; some medications used to treat diabetes, gout, and high cholesterol.  . In some cases, there are no symptoms of this condition. If the condition leads to anemia or nerve damage, various symptoms can occur, such as weakness or fatigue, shortness of breath, and numbness or tingling in your hands and feet.   . Treatment:  o May include taking vitamin B12 supplements.  o Avoid alcohol.  o Eat lots of healthy foods that contain vitamin B12: - Beef, pork, chicken, [place], and organ meats, such as liver.  - Seafood:  This includes clams, rainbow trout, salmon, tuna, and haddock. Eggs.  - Cereal and dairy products that are fortified: This means that vitamin B12 has been added to the food.   - Vitamin B12  5. At risk for activity intolerance Belinda Garrett was given approximately 15 minutes of exercise intolerance counseling today. She is 50 y.o. female and has risk factors  exercise intolerance including obesity. We discussed intensive lifestyle modifications today with an emphasis on specific weight loss instructions and strategies. Belinda Garrett will slowly increase activity as tolerated.  Repetitive spaced learning was employed today to elicit superior memory formation and behavioral change.  6. Class 3 severe obesity with serious comorbidity and body mass index (BMI) greater than or equal to 70 in adult, unspecified obesity type (HCC) Belinda Garrett is currently in the action stage of change. As such, her goal is to continue with weight loss efforts. She has agreed to the Category 4 Plan.   We discussed intentional eating.  Exercise goals: No exercise has been prescribed at this time.  Behavioral modification strategies: increasing lean protein intake, decreasing simple carbohydrates, increasing vegetables, increasing water intake, decreasing eating out, no skipping meals, meal planning and cooking strategies, keeping healthy foods in the home and planning for success.  Belinda Garrett has agreed to follow-up with our clinic in 2 weeks. She was informed of the importance of frequent follow-up visits to maximize her success with intensive lifestyle modifications for her multiple health conditions.   Belinda Garrett was informed we would discuss her lab results at her next visit unless there is a critical issue that needs to be addressed sooner. Belinda Garrett agreed to keep her next visit at the agreed upon time to discuss these results.  Objective:   Blood pressure 136/80, pulse 90, temperature 98.3 F (36.8 C), height 5' (1.524 m), weight (!) 448 lb (203.2 kg), SpO2 95 %. Body mass index is 87.49 kg/m.  General: Cooperative, alert, well developed, in no acute distress. HEENT: Conjunctivae and lids unremarkable. Cardiovascular: Regular rhythm.  Lungs: Normal work of breathing. Neurologic: No focal deficits.   Lab Results  Component Value Date   CREATININE 1.07 (H) 04/29/2020   BUN 15  04/29/2020   NA 141 04/29/2020   K 4.4 04/29/2020   CL 102 04/29/2020   CO2 21 04/29/2020   Lab Results  Component Value Date   ALT 29 04/29/2020   AST 19 04/29/2020   ALKPHOS 72 04/29/2020   BILITOT 0.8 04/29/2020   Lab Results  Component Value Date   HGBA1C 5.9 (H) 04/29/2020   HGBA1C 6.1 (H) 10/20/2019   HGBA1C 6.0 06/04/2019   HGBA1C 5.6 09/25/2018   HGBA1C 5.8 (H) 12/24/2017   Lab Results  Component Value Date   INSULIN 17.3 04/29/2020   INSULIN 31.4 (H) 10/20/2019   INSULIN 37.1 (H) 12/24/2017   Lab Results  Component Value Date   TSH 2.030 10/20/2019   Lab Results  Component Value Date   CHOL 180 04/29/2020   HDL 38 (L) 04/29/2020   LDLCALC 122 (H) 04/29/2020   TRIG 107 04/29/2020   CHOLHDL 5 06/04/2019   Lab Results  Component Value Date   WBC 8.7 06/04/2019   HGB 12.5 06/04/2019   HCT 38.5 06/04/2019   MCV 79.7 06/04/2019   PLT 217.0 06/04/2019   No results found for: IRON, TIBC, FERRITIN  Attestation Statements:   Reviewed by clinician on day of visit: allergies, medications, problem list, medical history, surgical history, family history, social history, and previous encounter notes.  Wilhemena Durie, am acting as Location manager for [employer], DO.  I have reviewed the above documentation for accuracy and completeness, and I agree with the above. Jearld Lesch, DO

## 2020-05-05 ENCOUNTER — Encounter (INDEPENDENT_AMBULATORY_CARE_PROVIDER_SITE_OTHER): Payer: Self-pay | Admitting: Bariatrics

## 2020-05-13 ENCOUNTER — Ambulatory Visit (INDEPENDENT_AMBULATORY_CARE_PROVIDER_SITE_OTHER): Payer: 59 | Admitting: Bariatrics

## 2020-05-19 ENCOUNTER — Other Ambulatory Visit: Payer: Self-pay

## 2020-05-19 ENCOUNTER — Ambulatory Visit (INDEPENDENT_AMBULATORY_CARE_PROVIDER_SITE_OTHER): Payer: 59 | Admitting: Physician Assistant

## 2020-05-19 ENCOUNTER — Other Ambulatory Visit (INDEPENDENT_AMBULATORY_CARE_PROVIDER_SITE_OTHER): Payer: Self-pay | Admitting: Physician Assistant

## 2020-05-19 ENCOUNTER — Encounter (INDEPENDENT_AMBULATORY_CARE_PROVIDER_SITE_OTHER): Payer: Self-pay | Admitting: Physician Assistant

## 2020-05-19 VITALS — BP 113/71 | HR 114 | Temp 97.7°F | Ht 60.0 in | Wt >= 6400 oz

## 2020-05-19 DIAGNOSIS — R7303 Prediabetes: Secondary | ICD-10-CM | POA: Diagnosis not present

## 2020-05-19 DIAGNOSIS — Z6841 Body Mass Index (BMI) 40.0 and over, adult: Secondary | ICD-10-CM | POA: Diagnosis not present

## 2020-05-19 DIAGNOSIS — F3289 Other specified depressive episodes: Secondary | ICD-10-CM | POA: Diagnosis not present

## 2020-05-19 DIAGNOSIS — Z9189 Other specified personal risk factors, not elsewhere classified: Secondary | ICD-10-CM

## 2020-05-19 MED ORDER — BUPROPION HCL ER (SR) 150 MG PO TB12: 150.0000 mg | ORAL_TABLET | Freq: Two times a day (BID) | ORAL | 0 refills | Status: DC

## 2020-05-19 MED ORDER — OZEMPIC (0.25 OR 0.5 MG/DOSE) 2 MG/1.5ML ~~LOC~~ SOPN
0.5000 mg | PEN_INJECTOR | SUBCUTANEOUS | 0 refills | Status: DC
Start: 2020-05-19 — End: 2020-05-19

## 2020-05-20 ENCOUNTER — Telehealth: Payer: Self-pay | Admitting: Internal Medicine

## 2020-05-20 NOTE — Progress Notes (Signed)
Chief Complaint:   OBESITY Belinda Garrett is here to discuss her progress with her obesity treatment plan along with follow-up of her obesity related diagnoses. Belinda Garrett is on the Category 4 Plan and states she is following her eating plan approximately 30-40% of the time. Belinda Garrett states she is doing 0 minutes 0 times per week.  Today's visit was #: 7 Starting weight: 454 lbs Starting date: 12/24/2017 Today's weight: 448 lbs Today's date: 05/19/2020 Total lbs lost to date: 6 Total lbs lost since last in-office visit: 2  Interim History: Belinda Garrett reports that she has been doing some emotional eating recently. She sometimes eats soup or has a protein shake for lunch, as she was getting tired of sandwiches.  Subjective:   1. Pre-diabetes Belinda Garrett is on Ozempic 0.25 mg, with no reduction in appetite as of yet.  2. Other depression, with emotional eating Belinda Garrett doesn't feel that Wellbutrin is helping.  3. At risk of diabetes mellitus Lyrical is at higher than average risk for developing diabetes due to obesity.   Assessment/Plan:   1. Pre-diabetes Yarielis agreed to increase Ozempic to 0.5 mg weekly with no refills. She will continue to work on weight loss, exercise, and decreasing simple carbohydrates to help decrease the risk of diabetes.   - Semaglutide,0.25 or 0.5MG /DOS, (OZEMPIC, 0.25 OR 0.5 MG/DOSE,) 2 MG/1.5ML SOPN; Inject 0.5 mg into the skin once a week.  Dispense: 1.5 mL; Refill: 0  2. Other depression, with emotional eating Behavior modification techniques were discussed today to help Belinda Garrett deal with her emotional/non-hunger eating behaviors. Belinda Garrett agreed to change to bupropion 150 mg BID with no refills. Orders and follow up as documented in patient record.   - buPROPion (WELLBUTRIN SR) 150 MG 12 hr tablet; Take 1 tablet (150 mg total) by mouth 2 (two) times daily.  Dispense: 60 tablet; Refill: 0  3. At risk of diabetes mellitus Belinda Garrett was given approximately 15 minutes of  diabetes education and counseling today. We discussed intensive lifestyle modifications today with an emphasis on weight loss as well as increasing exercise and decreasing simple carbohydrates in her diet. We also reviewed medication options with an emphasis on risk versus benefit of those discussed.   Repetitive spaced learning was employed today to elicit superior memory formation and behavioral change.  4. Class 3 severe obesity with serious comorbidity and body mass index (BMI) greater than or equal to 70 in adult, unspecified obesity type (HCC) Belinda Garrett is currently in the action stage of change. As such, her goal is to continue with weight loss efforts. She has agreed to the Category 4 Plan.   Exercise goals: No exercise has been prescribed at this time.  Behavioral modification strategies: meal planning and cooking strategies and keeping healthy foods in the home.  Belinda Garrett has agreed to follow-up with our clinic in 2 weeks. She was informed of the importance of frequent follow-up visits to maximize her success with intensive lifestyle modifications for her multiple health conditions.   Objective:   Blood pressure 113/71, pulse (!) 114, temperature 97.7 F (36.5 C), height 5' (1.524 m), weight (!) 446 lb (202.3 kg), SpO2 97 %. Body mass index is 87.1 kg/m.  General: Cooperative, alert, well developed, in no acute distress. HEENT: Conjunctivae and lids unremarkable. Cardiovascular: Regular rhythm.  Lungs: Normal work of breathing. Neurologic: No focal deficits.   Lab Results  Component Value Date   CREATININE 1.07 (H) 04/29/2020   BUN 15 04/29/2020   NA 141 04/29/2020  K 4.4 04/29/2020   CL 102 04/29/2020   CO2 21 04/29/2020   Lab Results  Component Value Date   ALT 29 04/29/2020   AST 19 04/29/2020   ALKPHOS 72 04/29/2020   BILITOT 0.8 04/29/2020   Lab Results  Component Value Date   HGBA1C 5.9 (H) 04/29/2020   HGBA1C 6.1 (H) 10/20/2019   HGBA1C 6.0 06/04/2019    HGBA1C 5.6 09/25/2018   HGBA1C 5.8 (H) 12/24/2017   Lab Results  Component Value Date   INSULIN 17.3 04/29/2020   INSULIN 31.4 (H) 10/20/2019   INSULIN 37.1 (H) 12/24/2017   Lab Results  Component Value Date   TSH 2.030 10/20/2019   Lab Results  Component Value Date   CHOL 180 04/29/2020   HDL 38 (L) 04/29/2020   LDLCALC 122 (H) 04/29/2020   TRIG 107 04/29/2020   CHOLHDL 5 06/04/2019   Lab Results  Component Value Date   WBC 8.7 06/04/2019   HGB 12.5 06/04/2019   HCT 38.5 06/04/2019   MCV 79.7 06/04/2019   PLT 217.0 06/04/2019   No results found for: IRON, TIBC, FERRITIN  Attestation Statements:   Reviewed by clinician on day of visit: allergies, medications, problem list, medical history, surgical history, family history, social history, and previous encounter notes.   Wilhemena Durie, am acting as transcriptionist for [employer], PA-C.  I have reviewed the above documentation for accuracy and completeness, and I agree with the above. Abby Potash, PA-C

## 2020-05-20 NOTE — Telephone Encounter (Signed)
Rejection Reason - Other - PT CANCELLED APPT AND DID NOT RESCHEDULE" Mervyn Gay said on May 20, 2020 2:21 PM  [facility]

## 2020-05-23 ENCOUNTER — Encounter (INDEPENDENT_AMBULATORY_CARE_PROVIDER_SITE_OTHER): Payer: Self-pay | Admitting: Physician Assistant

## 2020-05-24 NOTE — Telephone Encounter (Signed)
Please review

## 2020-05-24 NOTE — Telephone Encounter (Signed)
Can you refer her to Dr Mallie Mussel and let her know please?

## 2020-05-25 NOTE — Telephone Encounter (Signed)
Please set this patient up to see Dr Mallie Mussel at her convenience.   Belinda Garrett, [name]

## 2020-05-25 NOTE — Progress Notes (Signed)
Office: [phone]  /  Fax: [phone]    Date: May 27, 2020   Appointment Start Time: 11:00am Duration: 54 minutes Provider: Glennie Isle, Psy.D. Type of Session: Intake for Individual Therapy  Location of Patient: Home Location of Provider: Provider's Home (private office) Type of Contact: Telepsychological Visit via MyChart Video Visit  Informed Consent: Prior to proceeding with today's appointment, two pieces of identifying information were obtained. In addition, Belinda Garrett's physical location at the time of this appointment was obtained as well a phone number she could be reached at in the event of technical difficulties. Belinda Garrett and this provider participated in today's telepsychological service.   The provider's role was explained to Belinda Garrett. The provider reviewed and discussed issues of confidentiality, privacy, and limits therein (e.g., reporting obligations). In addition to verbal informed consent, written informed consent for psychological services was obtained prior to the initial appointment. Since the clinic is not a 24/7 crisis center, mental health emergency resources were shared and this  provider explained MyChart, e-mail, voicemail, and/or other messaging systems should be utilized only for non-emergency reasons. This provider also explained that information obtained during appointments will be placed in [facility] record and relevant information will be shared with other providers at [facility] for coordination of care. Belinda Garrett agreed information may be shared with other [facility] providers as needed for coordination of care and by signing the service agreement document, she provided written consent for coordination of care. Prior to initiating telepsychological services, Belinda Garrett completed an informed consent document, which included the development of a safety plan (i.e., an emergency contact and emergency resources) in the event of  an emergency/crisis. Belinda Garrett expressed understanding of the rationale of the safety plan. Belinda Garrett verbally acknowledged understanding she is ultimately responsible for understanding her insurance benefits for telepsychological and in-person services. This provider also reviewed confidentiality, as it relates to telepsychological services, as well as the rationale for telepsychological services (i.e., to reduce exposure risk to COVID-19). Belinda Garrett  acknowledged understanding that appointments cannot be recorded without both party consent and she is aware she is responsible for securing confidentiality on her end of the session. Belinda Garrett verbally consented to proceed.  Chief Complaint/HPI: Belinda Garrett requested to meet with this provider after her appointment with Belinda Garrett, Belinda Garrett. Per the note for the visit with Belinda Potash, PA-C on May 19, 2020, "Belinda Garrett reports that she has been doing some emotional eating recently. She sometimes eats soup or has a protein shake for lunch, as she was getting tired of sandwiches."   During today's appointment, Belinda Garrett was verbally administered a questionnaire assessing various behaviors related to emotional eating. Belinda Garrett endorsed the following: overeat when you are celebrating, eat certain foods when you are anxious, stressed, depressed, or your feelings are hurt, use food to help you cope with emotional situations, find food is comforting to you, overeat when you are angry or upset, overeat when you are worried about something, overeat frequently when you are bored or lonely, not worry about what you eat when you are in a good mood, overeat when you are alone, but eat much less when you are with other people and eat as a reward. Belinda Garrett described the current frequency of emotional eating as "few times a week." In addition, Belinda Garrett denied a history of engaging in binge eating behaviors, but described overeating at times. Belinda Garrett denied a history of restricting food intake, purging and  engagement in other compensatory strategies for weight loss, and has never  been diagnosed with an eating disorder. She also denied a history of treatment for emotional eating. Currently, Belinda Garrett indicated stress triggers emotional eating, whereas "sticking to" her meal plan makes emotional eating better. Furthermore, Belinda Garrett shared she is caring for her mother, noting there has been an increase in challenges in the past year.   Mental Status Examination:  Appearance: well groomed and appropriate hygiene  Behavior: appropriate to circumstances Mood: sad Affect: mood congruent; tearful when discussing history  Speech: normal in rate, volume, and tone Eye Contact: appropriate Psychomotor Activity: unable to assess  Gait: unable to assess Thought Process: linear, logical, and goal directed  Thought Content/Perception: denies suicidal and homicidal ideation, plan, and intent and no hallucinations, delusions, bizarre thinking or behavior reported or observed Orientation: time, person, place, and purpose of appointment Memory/Concentration: memory, attention, language, and fund of knowledge intact  Insight/Judgment: good  Family & Psychosocial History: Belinda Garrett reported she is not in a relationship and does not have any children. 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 Additionally, Belinda Garrett shared her highest level of education obtained is a high school diploma. Currently, Belinda Garrett's social support system consists of her best friend and mother. Moreover, Belinda Garrett stated she resides with her cat.   Medical History:  Past Medical History:  Diagnosis Date  . Abnormal menses   . Allergy   . Arthritis   . Back pain   . Back pain   . Chicken pox   . Depression   . Diabetes mellitus without complication ([place])   . Hypertension   . Joint pain   . Joint pain   . Lymphedema   . Palpitations   . Peripheral neuropathy 09/30/2018  . Prediabetes   . Rosacea   . Seasonal  allergies   . Sleep apnea   . Stasis dermatitis of both legs   . Vertigo    Past Surgical History:  Procedure Laterality Date  . WISDOM TOOTH EXTRACTION     Current Outpatient Medications on File Prior to Visit  Medication Sig Dispense Refill  . acetaminophen (TYLENOL) 500 MG tablet Take 500 mg by mouth every 6 (six) hours as needed.    . Alpha-Lipoic Acid (LIPOIC ACID PO) Take 600 mg by mouth daily.    . B Complex Vitamins (VITAMIN B COMPLEX PO) Take by mouth.    [name] buPROPion (WELLBUTRIN SR) 150 MG 12 hr tablet Take 1 tablet (150 mg total) by mouth 2 (two) times daily. 60 tablet 0  . Cholecalciferol (VITAMIN D3) 50 MCG (2000 UT) capsule Take 2,000 Units by mouth daily.    [name] FLUoxetine (PROZAC) 20 MG tablet Take 2 tablets (40 mg total) by mouth daily. 180 tablet 3  . gabapentin (NEURONTIN) 300 MG capsule Take 1 capsule (300 mg total) by mouth at bedtime. 90 capsule 1  . glucosamine-chondroitin 500-400 MG tablet Take 1 tablet by mouth 3 (three) times daily.    . hydrochlorothiazide (HYDRODIURIL) 25 MG tablet Take 1 tablet (25 mg total) by mouth daily. In am 90 tablet 3  . lisinopril (ZESTRIL) 30 MG tablet Take 1 tablet (30 mg total) by mouth daily. 90 tablet 3  . loratadine (CLARITIN) 10 MG tablet Take 10 mg by mouth daily.    . medroxyPROGESTERone (PROVERA) 2.5 MG tablet Take 1 tablet (2.5 mg total) by mouth daily. 90 tablet 2  . metFORMIN (GLUCOPHAGE) 500 MG tablet Take 1 tablet (500 mg total) by mouth daily with breakfast. 90 tablet 0  .  metroNIDAZOLE (METROGEL) 0.75 % gel Apply 1 application topically 2 (two) times daily.    . Multiple Vitamin (MULTIVITAMIN) capsule Take 1 capsule by mouth daily.    . Omega-3 Fatty Acids (FISH OIL) 1000 MG CAPS Take 1,000 mg by mouth daily.    . Semaglutide,0.25 or 0.5MG /DOS, (OZEMPIC, 0.25 OR 0.5 MG/DOSE,) 2 MG/1.5ML SOPN Inject 0.5 mg into the skin once a week. 1.5 mL 0   No current facility-administered medications on file prior to visit.   Mental  Health History: Esmirna reported she initiated therapeutic services via [facility] a couple weeks ago, noting, "It has been good." She indicated the focus of treatment is coping with stress and anxiety. Their next appointment is March 21st and Armoni agreed to sign an authorization to coordinate care if deemed necessary. Mykael reported there is no history of hospitalizations for psychiatric concerns. Lorali denied a family history of mental health related/substance abuse concerns. Adeena reported there is no history of trauma including psychological, physical  and sexual abuse, as well as neglect.   Ahliya described her typical mood lately as "nervous," noting she lost power this morning. She further shared she is constantly trying to "keep all the balls in the air." Aside from concerns noted above and endorsed on the PHQ-9 and GAD-7, Theora reported experiencing decreased motivation; crying spells; social withdrawal; decreased self-esteem; and worry thoughts about her well-being due to health concerns. Tiawanna denied current alcohol use. She denied tobacco use. She denied illicit/recreational substance use. Furthermore, Ardythe indicated she is not experiencing the following: hallucinations and delusions, paranoia, symptoms of mania  and panic attacks. She also denied history of and current suicidal ideation, plan, and intent; history of and current homicidal ideation, plan, and intent; and history of and current engagement in self-harm. Notably, Blanchie endorsed item 9 (i.e., "Do you feel that your weight problem is so hopeless that sometimes life doesn't seem worth living?") on the modified PHQ-9 during her initial appointments with the clinic on January 03, 2018 and October 20, 2019. She clarified on both occassions she endorsed the item due to feeling stuck with her weight loss and not due to suicidal ideation.   Legal History: Cortne reported there is no history of legal involvement.   Structured Assessments  Results: The Patient Health Questionnaire-9 (PHQ-9) is a self-report measure that assesses symptoms and severity of depression over the course of the last two weeks. Sharanda obtained a score of 10 suggesting moderate depression. Arria finds the endorsed symptoms to be somewhat difficult. [0= Not at all; 1= Several days; 2= More than half the days; 3= Nearly every day] Little interest or pleasure in doing things 0  Feeling down, depressed, or hopeless 2  Trouble falling or staying asleep, or sleeping too much 1  Feeling tired or having little energy 3  Poor appetite or overeating 1  Feeling bad about yourself --- or that you are a failure or have let yourself or your family down 3  Trouble concentrating on things, such as reading the newspaper or watching television 0  Moving or speaking so slowly that other people could have noticed? Or the opposite --- being so fidgety or restless that you have been moving around a lot more than usual 0  Thoughts that you would be better off dead or hurting yourself in some way 0  PHQ-9 Score 10    The Generalized Anxiety Disorder-7 (GAD-7) is a brief self-report measure that assesses symptoms of anxiety over the course of the last  two weeks. Maysel obtained a score of 2 suggesting minimal anxiety. Pearson finds the endorsed symptoms to be somewhat difficult. [0= Not at all; 1= Several days; 2= Over half the days; 3= Nearly every day] Feeling nervous, anxious, on edge 1  Not being able to stop or control worrying 0  Worrying too much about different things 0  Trouble relaxing 0  Being so restless that it's hard to sit still 0  Becoming easily annoyed or irritable 1  Feeling afraid as if something awful might happen 0  GAD-7 Score 2   Interventions:  Conducted a chart review Focused on rapport building Verbally administered PHQ-9 and GAD-7 for symptom monitoring Verbally administered Food & Mood questionnaire to assess various behaviors related to  emotional eating Provided emphatic reflections and validation Collaborated with patient on a treatment goal  Psychoeducation provided regarding pleasurable activities Psychoeducation provided regarding self-care  Provisional DSM-5 Diagnosis(es): 311 (F32.8) Other Specified Depressive Disorder, Emotional Eating Behaviors   Plan: Daylynn appears able and willing to participate as evidenced by collaboration on a treatment goal, engagement in reciprocal conversation, and asking questions as needed for clarification. The next appointment will be scheduled in two weeks, which will be via MyChart Video Visit. The following treatment goal was established: increase coping skills. This provider will regularly review the treatment plan and medical chart to keep informed of status changes. Katalena expressed understanding and agreement with the initial treatment plan of care.  Based on Huong's self-report, psychoeducation regarding the importance of self-care utilizing the oxygen mask metaphor was provided. Additionally, psychoeducation regarding pleasurable activities, including its impact on emotional eating and overall well-being was provided. Liyah was provided with a handout with various options of pleasurable activities, and was encouraged to engage in one activity a day. Levada Dy agreed. Liliani provided verbal consent during today's appointment for this provider to send the handout via e-mail.

## 2020-05-27 ENCOUNTER — Telehealth (INDEPENDENT_AMBULATORY_CARE_PROVIDER_SITE_OTHER): Payer: Self-pay | Admitting: Psychology

## 2020-05-27 ENCOUNTER — Telehealth (INDEPENDENT_AMBULATORY_CARE_PROVIDER_SITE_OTHER): Payer: 59 | Admitting: Psychology

## 2020-05-27 DIAGNOSIS — F3289 Other specified depressive episodes: Secondary | ICD-10-CM | POA: Diagnosis not present

## 2020-05-27 NOTE — Progress Notes (Signed)
  Office: [phone]  /  Fax: [phone]    Date: June 10, 2020 Appointment Start Time: 10:00am Duration: 32 minutes Provider: Glennie Isle, Psy.D. Type of Session: Individual Therapy  Location of Patient: Home Location of Provider: Provider's Home (private office) Type of Contact: Telepsychological Visit via MyChart Video Visit  Session Content: Santana is a 50 y.o. female presenting for a follow-up appointment to address the previously established treatment goal of increasing coping skills. Today's appointment was a telepsychological visit due to COVID-19. Kissie provided verbal consent for today's telepsychological appointment and she is aware she is responsible for securing confidentiality on her end of the session. Prior to proceeding with today's appointment, Halyn's physical location at the time of this appointment was obtained as well a phone number she could be reached at in the event of technical difficulties. Tanasia and this provider participated in today's telepsychological service.   This provider conducted a brief check-in. Hanni shared about recent events/stressors. Associated thoughts and feelings were briefly processed. Reviewed pleasurable activities. Positive reinforcement was provided. She was encouraged to continue engaging in pleasurable activities as she noted an improvement in overall well-being as well as reduction in emotional eating. Additionally, psychoeducation regarding radical acceptance was provided, including its impact on well-being and emotional eating. This provider and Oliviana also discussed various coping strategies involving the senses. Fantasia was receptive to today's appointment as evidenced by openness to sharing, responsiveness to feedback, and willingness to implement discussed strategies .  Mental Status Examination:  Appearance: well groomed and appropriate hygiene  Behavior: appropriate to circumstances Mood: euthymic Affect: mood  congruent Speech: normal in rate, volume, and tone Eye Contact: appropriate Psychomotor Activity: unable to assess  Gait: unable to assess Thought Process: linear, logical, and goal directed  Thought Content/Perception: no hallucinations, delusions, bizarre thinking or behavior reported or observed and no evidence of suicidal and homicidal ideation, plan, and intent Orientation: time, person, place, and purpose of appointment Memory/Concentration: memory, attention, language, and fund of knowledge intact  Insight/Judgment: good  Interventions:  Conducted a brief chart review Provided empathic reflections and validation Reviewed content from the previous session Provided positive reinforcement Employed supportive psychotherapy interventions to facilitate reduced distress and to improve coping skills with identified stressors Psychoeducation provided regarding radical acceptance  DSM-5 Diagnosis(es): 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 Sagal has demonstrated progress in her goal as evidenced by engagement in pleasurable activities.   Plan: The next appointment will be scheduled in two weeks, which will be via MyChart Video Visit. The next session will focus on working towards the established treatment goal.

## 2020-05-27 NOTE — Telephone Encounter (Signed)
  Office: [phone]  /  Fax: [phone]  Date of Call: May 27, 2020  Time of Call: 12:00pm Provider: Glennie Isle, PsyD  CONTENT:  This provider called Aritha to verify her e-mail address prior to sending handouts from today's visit. A HIPAA compliant voicemail was left requesting a call back.   PLAN:  This provider will wait for Charika to call back before sending the e-mail.

## 2020-05-29 DIAGNOSIS — G4733 Obstructive sleep apnea (adult) (pediatric): Secondary | ICD-10-CM | POA: Diagnosis not present

## 2020-05-31 ENCOUNTER — Other Ambulatory Visit: Payer: Self-pay | Admitting: Internal Medicine

## 2020-05-31 DIAGNOSIS — I10 Essential (primary) hypertension: Secondary | ICD-10-CM

## 2020-05-31 NOTE — Telephone Encounter (Signed)
Thank you :)

## 2020-05-31 NOTE — Telephone Encounter (Signed)
Patient is set up for 3/24 with Dr Mallie Mussel.  Belinda Garrett, [place]

## 2020-06-07 ENCOUNTER — Other Ambulatory Visit: Payer: Self-pay

## 2020-06-07 ENCOUNTER — Ambulatory Visit (INDEPENDENT_AMBULATORY_CARE_PROVIDER_SITE_OTHER): Payer: 59 | Admitting: Bariatrics

## 2020-06-07 ENCOUNTER — Encounter (INDEPENDENT_AMBULATORY_CARE_PROVIDER_SITE_OTHER): Payer: Self-pay | Admitting: Bariatrics

## 2020-06-07 VITALS — BP 112/68 | HR 109 | Temp 98.2°F | Ht 60.0 in | Wt >= 6400 oz

## 2020-06-07 DIAGNOSIS — E7800 Pure hypercholesterolemia, unspecified: Secondary | ICD-10-CM | POA: Diagnosis not present

## 2020-06-07 DIAGNOSIS — F5089 Other specified eating disorder: Secondary | ICD-10-CM | POA: Diagnosis not present

## 2020-06-07 DIAGNOSIS — Z6841 Body Mass Index (BMI) 40.0 and over, adult: Secondary | ICD-10-CM

## 2020-06-07 DIAGNOSIS — R7303 Prediabetes: Secondary | ICD-10-CM

## 2020-06-07 DIAGNOSIS — Z9189 Other specified personal risk factors, not elsewhere classified: Secondary | ICD-10-CM

## 2020-06-08 ENCOUNTER — Other Ambulatory Visit (HOSPITAL_BASED_OUTPATIENT_CLINIC_OR_DEPARTMENT_OTHER): Payer: Self-pay

## 2020-06-09 ENCOUNTER — Encounter (INDEPENDENT_AMBULATORY_CARE_PROVIDER_SITE_OTHER): Payer: Self-pay | Admitting: Bariatrics

## 2020-06-09 NOTE — Progress Notes (Signed)
Chief Complaint:   OBESITY Belinda Garrett is here to discuss her progress with her obesity treatment plan along with follow-up of her obesity related diagnoses. Belinda Garrett is on the Category 4 Plan and states she is following her eating plan approximately 25% of the time. Belinda Garrett states she is not exercising regularly at this time.  Today's visit was #: 18 Starting weight: 454 lbs Starting date: 12/24/2017 Today's weight: 448 lbs Today's date: 06/07/2020 Total lbs lost to date: 6 lbs Total lbs lost since last in-office visit: 0  Interim History: Belinda Garrett's weight is up 2 pounds since her last visit.  She is taking Ozempic without side effects and started 0.5 mg weekly.  Subjective:   1. Pre-diabetes Belinda Garrett has a diagnosis of prediabetes based on her elevated HgA1c and was informed this puts her at greater risk of developing diabetes. She continues to work on diet and exercise to decrease her risk of diabetes. She denies nausea or hypoglycemia.  She is taking metformin and Ozempic.  Insulin was 31.4 and is down to 17.3.  A1c 5.9.  Lab Results  Component Value Date   HGBA1C 5.9 (H) 04/29/2020   Lab Results  Component Value Date   INSULIN 17.3 04/29/2020   INSULIN 31.4 (H) 10/20/2019   INSULIN 37.1 (H) 12/24/2017   2. Elevated cholesterol Belinda Garrett has hyperlipidemia and has been trying to improve her cholesterol levels with intensive lifestyle modification including a low saturated fat diet, exercise and weight loss. She denies any chest pain, claudication or myalgias.  Taking fish oil.  Lab Results  Component Value Date   ALT 29 04/29/2020   AST 19 04/29/2020   ALKPHOS 72 04/29/2020   BILITOT 0.8 04/29/2020   Lab Results  Component Value Date   CHOL 180 04/29/2020   HDL 38 (L) 04/29/2020   LDLCALC 122 (H) 04/29/2020   TRIG 107 04/29/2020   CHOLHDL 5 06/04/2019   3. Other disorder of eating She is seeing Dr. Mallie Mussel.  Taking Wellbutrin.  Wellbutrin was increased to twice  daily.  4. At risk for dehydration Belinda Garrett is at risk for dehydration due to being very busy and warmer weather.    Assessment/Plan:   1. Pre-diabetes Belinda Garrett will continue to work on weight loss, exercise, and decreasing simple carbohydrates to help decrease the risk of diabetes.  Continue medication.  2. Elevated cholesterol Cardiovascular risk and specific lipid/LDL goals reviewed.  We discussed several lifestyle modifications today and Belinda Garrett will continue to work on diet, exercise and weight loss efforts. Orders and follow up as documented in patient record.  Increase fiber.  Take OTC Metamucil.  Counseling Intensive lifestyle modifications are the first line treatment for this issue. . Dietary changes: Increase soluble fiber. Decrease simple carbohydrates. . Exercise changes: Moderate to vigorous-intensity aerobic activity 150 minutes per week if tolerated. . Lipid-lowering medications: see documented in medical record.  3. Other disorder of eating Follow-up with Dr. Mallie Mussel.  Continue medication.  4. At risk for dehydration Belinda Garrett was given approximately 15 minutes dehydration prevention counseling today. Belinda Garrett is at risk for dehydration due to weight loss and current medication(s). She was encouraged to hydrate and monitor fluid status to avoid dehydration as well as weight loss plateaus.  She will carry her water bottle with her.  5. Class 3 severe obesity with serious comorbidity and body mass index (BMI) greater than or equal to 70 in adult, unspecified obesity type (HCC)  Belinda Garrett is currently in the action stage of change. As  such, her goal is to continue with weight loss efforts. She has agreed to the Category 4 Plan.   She will work on adhering closely to the plan, mindful eating.  Labs from 04/29/2020 incluing CMP, lipids, vitamin D, and B12 were reviewed.  Exercise goals: All adults should avoid inactivity. Some physical activity is better than none, and adults who  participate in any amount of physical activity gain some health benefits.  Behavioral modification strategies: increasing lean protein intake, decreasing simple carbohydrates, increasing vegetables, increasing water intake, decreasing eating out, no skipping meals, meal planning and cooking strategies, keeping healthy foods in the home and planning for success.  Belinda Garrett has agreed to follow-up with our clinic in 2-3 weeks. She was informed of the importance of frequent follow-up visits to maximize her success with intensive lifestyle modifications for her multiple health conditions.   Objective:   Blood pressure 112/68, pulse (!) 109, temperature 98.2 F (36.8 C), height 5' (1.524 m), weight (!) 448 lb (203.2 kg), SpO2 97 %. Body mass index is 87.49 kg/m.  General: Cooperative, alert, well developed, in no acute distress. HEENT: Conjunctivae and lids unremarkable. Cardiovascular: Regular rhythm.  Lungs: Normal work of breathing. Neurologic: No focal deficits.  Using a cane with ambulation.   Lab Results  Component Value Date   CREATININE 1.07 (H) 04/29/2020   BUN 15 04/29/2020   NA 141 04/29/2020   K 4.4 04/29/2020   CL 102 04/29/2020   CO2 21 04/29/2020   Lab Results  Component Value Date   ALT 29 04/29/2020   AST 19 04/29/2020   ALKPHOS 72 04/29/2020   BILITOT 0.8 04/29/2020   Lab Results  Component Value Date   HGBA1C 5.9 (H) 04/29/2020   HGBA1C 6.1 (H) 10/20/2019   HGBA1C 6.0 06/04/2019   HGBA1C 5.6 09/25/2018   HGBA1C 5.8 (H) 12/24/2017   Lab Results  Component Value Date   INSULIN 17.3 04/29/2020   INSULIN 31.4 (H) 10/20/2019   INSULIN 37.1 (H) 12/24/2017   Lab Results  Component Value Date   TSH 2.030 10/20/2019   Lab Results  Component Value Date   CHOL 180 04/29/2020   HDL 38 (L) 04/29/2020   LDLCALC 122 (H) 04/29/2020   TRIG 107 04/29/2020   CHOLHDL 5 06/04/2019   Lab Results  Component Value Date   WBC 8.7 06/04/2019   HGB 12.5 06/04/2019    HCT 38.5 06/04/2019   MCV 79.7 06/04/2019   PLT 217.0 06/04/2019   Attestation Statements:   Reviewed by clinician on day of visit: allergies, medications, problem list, medical history, surgical history, family history, social history, and previous encounter notes.  I, Water quality scientist, CMA, am acting as Location manager for [employer], DO  I have reviewed the above documentation for accuracy and completeness, and I agree with the above. Jearld Lesch, DO

## 2020-06-10 ENCOUNTER — Telehealth (INDEPENDENT_AMBULATORY_CARE_PROVIDER_SITE_OTHER): Payer: 59 | Admitting: Psychology

## 2020-06-10 DIAGNOSIS — F3289 Other specified depressive episodes: Secondary | ICD-10-CM

## 2020-06-10 NOTE — Progress Notes (Incomplete)
Office: [phone]  /  Fax: [phone]    Date: June 24, 2020   Appointment Start Time: *** Duration: *** minutes Provider: Glennie Isle, Psy.D. Type of Session: Individual Therapy  Location of Patient: {gbptloc:23249} Location of Provider: Provider's Home (private office) Type of Contact: Telepsychological Visit via MyChart Video Visit  Session Content: Belinda Garrett is a 50 y.o. female presenting for a follow-up appointment to address the previously established treatment goal of increasing coping skills. Today's appointment was a telepsychological visit due to COVID-19. [name] provided verbal consent for today's telepsychological appointment and she is aware she is responsible for securing confidentiality on her end of the session. Prior to proceeding with today's appointment, [name]'s physical location at the time of this appointment was obtained as well a phone number she could be reached at in the event of technical difficulties. Sruti and this provider participated in today's telepsychological service.   This provider conducted a brief check-in and verbally administered the PHQ-9 and GAD-7. *** [name] was receptive to today's appointment as evidenced by openness to sharing, responsiveness to feedback, and {gbreceptiveness:23401}.  Mental Status Examination:  Appearance: {Appearance:22431} Behavior: {Behavior:22445} Mood: {gbmood:21757} Affect: {Affect:22436} Speech: {Speech:22432} Eye Contact: {Eye Contact:22433} Psychomotor Activity: {Motor Activity:22434} Gait: {gbgait:23404} Thought Process: {thought process:22448}  Thought Content/Perception: {disturbances:22451} Orientation: {Orientation:22437} Memory/Concentration: {gbcognition:22449} Insight/Judgment: {Insight:22446}  Structured Assessments Results: The Patient Health Questionnaire-9 (PHQ-9) is a self-report measure that assesses symptoms and severity of depression over the course of the last two weeks. [name] obtained a  score of *** suggesting {GBPHQ9SEVERITY:21752}. [name] finds the endorsed symptoms to be {gbphq9difficulty:21754}. [0= Not at all; 1= Several days; 2= More than half the days; 3= Nearly every day] Little interest or pleasure in doing things ***  Feeling down, depressed, or hopeless ***  Trouble falling or staying asleep, or sleeping too much ***  Feeling tired or having little energy ***  Poor appetite or overeating ***  Feeling bad about yourself --- or that you are a failure or have let yourself or your family down ***  Trouble concentrating on things, such as reading the newspaper or watching television ***  Moving or speaking so slowly that other people could have noticed? Or the opposite --- being so fidgety or restless that you have been moving around a lot more than usual ***  Thoughts that you would be better off dead or hurting yourself in some way ***  PHQ-9 Score ***    The Generalized Anxiety Disorder-7 (GAD-7) is a brief self-report measure that assesses symptoms of anxiety over the course of the last two weeks. [name] obtained a score of *** suggesting {gbgad7severity:21753}. [name] finds the endorsed symptoms to be {gbphq9difficulty:21754}. [0= Not at all; 1= Several days; 2= Over half the days; 3= Nearly every day] Feeling nervous, anxious, on edge ***  Not being able to stop or control worrying ***  Worrying too much about different things ***  Trouble relaxing ***  Being so restless that it's hard to sit still ***  Becoming easily annoyed or irritable ***  Feeling afraid as if something awful might happen ***  GAD-7 Score ***   Interventions:  {Interventions for Progress Notes:23405}  DSM-5 Diagnosis(es): 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 [name] has demonstrated progress in her goal  as evidenced by {gbtxprogress:22839}. [name] also {gbtxprogress2:22951}.  Plan: The next appointment will be scheduled in {gbweeks:21758}, which  will be {gbtxmodality:23402}. The next session will focus on {Plan for Next Appointment:23400}.

## 2020-06-16 ENCOUNTER — Ambulatory Visit (INDEPENDENT_AMBULATORY_CARE_PROVIDER_SITE_OTHER): Payer: 59 | Admitting: Internal Medicine

## 2020-06-16 ENCOUNTER — Other Ambulatory Visit: Payer: Self-pay | Admitting: Internal Medicine

## 2020-06-16 ENCOUNTER — Encounter: Payer: Self-pay | Admitting: Internal Medicine

## 2020-06-16 ENCOUNTER — Other Ambulatory Visit: Payer: Self-pay

## 2020-06-16 VITALS — BP 118/80 | HR 113 | Temp 98.4°F | Ht 60.0 in | Wt >= 6400 oz

## 2020-06-16 DIAGNOSIS — R002 Palpitations: Secondary | ICD-10-CM

## 2020-06-16 DIAGNOSIS — I872 Venous insufficiency (chronic) (peripheral): Secondary | ICD-10-CM | POA: Diagnosis not present

## 2020-06-16 DIAGNOSIS — F32A Depression, unspecified: Secondary | ICD-10-CM

## 2020-06-16 DIAGNOSIS — R319 Hematuria, unspecified: Secondary | ICD-10-CM

## 2020-06-16 DIAGNOSIS — R7303 Prediabetes: Secondary | ICD-10-CM | POA: Diagnosis not present

## 2020-06-16 DIAGNOSIS — R937 Abnormal findings on diagnostic imaging of other parts of musculoskeletal system: Secondary | ICD-10-CM | POA: Diagnosis not present

## 2020-06-16 DIAGNOSIS — F419 Anxiety disorder, unspecified: Secondary | ICD-10-CM | POA: Diagnosis not present

## 2020-06-16 DIAGNOSIS — I890 Lymphedema, not elsewhere classified: Secondary | ICD-10-CM

## 2020-06-16 DIAGNOSIS — Z1283 Encounter for screening for malignant neoplasm of skin: Secondary | ICD-10-CM

## 2020-06-16 DIAGNOSIS — Z780 Asymptomatic menopausal state: Secondary | ICD-10-CM

## 2020-06-16 DIAGNOSIS — I10 Essential (primary) hypertension: Secondary | ICD-10-CM | POA: Diagnosis not present

## 2020-06-16 DIAGNOSIS — G8929 Other chronic pain: Secondary | ICD-10-CM

## 2020-06-16 DIAGNOSIS — G629 Polyneuropathy, unspecified: Secondary | ICD-10-CM

## 2020-06-16 DIAGNOSIS — L219 Seborrheic dermatitis, unspecified: Secondary | ICD-10-CM | POA: Diagnosis not present

## 2020-06-16 MED ORDER — HYDROCHLOROTHIAZIDE 25 MG PO TABS: 25.0000 mg | ORAL_TABLET | Freq: Every day | ORAL | 3 refills | Status: DC

## 2020-06-16 MED ORDER — FLUOXETINE HCL 20 MG PO TABS
40.0000 mg | ORAL_TABLET | Freq: Every day | ORAL | 3 refills | Status: DC
Start: 2020-06-16 — End: 2020-06-16

## 2020-06-16 MED ORDER — METFORMIN HCL 500 MG PO TABS
500.0000 mg | ORAL_TABLET | Freq: Every day | ORAL | 3 refills | Status: DC
Start: 2020-06-16 — End: 2020-06-16

## 2020-06-16 MED ORDER — LISINOPRIL 30 MG PO TABS: 30.0000 mg | ORAL_TABLET | Freq: Every day | ORAL | 3 refills | Status: DC

## 2020-06-16 MED ORDER — GABAPENTIN 300 MG PO CAPS: 300.0000 mg | ORAL_CAPSULE | Freq: Every day | ORAL | 3 refills | Status: DC

## 2020-06-16 NOTE — Progress Notes (Signed)
Chief Complaint  Patient presents with  . Follow-up   F/u  1.palpitations intermittently Q4 months heart races x 10-15 min 2. Chronic lymphedema disc consider vascular f/u for lymph pumps has tried compression stockings no cellulitis sx's today wants also referral Dr. Evorn Gong for seb derm, skin check and this issue  3. Menopause with hot flashes crying spells mood swings/anxiety due to mom has fallen 4x recently had tried therapy at [facility] and [facility] on prozac 40 mg qd and wellbutrin 150 SR bid  4. Disc need for colonoscopy and mammogram call to schedule 5. Chronic pain in joints 3/10 today only doing tylenol  6. Disc shingrix and covid 4th dose today   Review of Systems  Constitutional: Negative for weight loss.  HENT: Negative for hearing loss.   Eyes: Negative for blurred vision.  Respiratory: Negative for shortness of breath.   Cardiovascular: Positive for palpitations. Negative for chest pain.  Gastrointestinal: Negative for abdominal pain.  Genitourinary:       +hot flashes  Musculoskeletal: Positive for back pain, joint pain and neck pain. Negative for falls.  Skin: Negative for rash.  Neurological: Negative for headaches.  Psychiatric/Behavioral: Positive for depression. The patient is nervous/anxious.    Past Medical History:  Diagnosis Date  . Abnormal menses   . Allergy   . Arthritis   . Back pain   . Back pain   . Chicken pox   . Depression   . Diabetes mellitus without complication ([place])   . Hypertension   . Joint pain   . Joint pain   . Lymphedema   . Palpitations   . Peripheral neuropathy 09/30/2018  . Prediabetes   . Rosacea   . Seasonal allergies   . Sleep apnea   . Stasis dermatitis of both legs   . Vertigo    Past Surgical History:  Procedure Laterality Date  . WISDOM TOOTH EXTRACTION     Family History  Problem Relation Age of Onset  . Arthritis Mother   . Depression Mother   . Hypertension Mother   . Thyroid disease Mother   .  Obesity Mother   . Depression Father   . Parkinson's disease Father   . Dementia Father   . Obesity Father   . Arthritis Maternal Grandmother   . Heart disease Maternal Grandmother   . Hypertension Maternal Grandmother   . Hyperlipidemia Maternal Grandmother   . Stroke Maternal Grandmother   . Alcohol abuse Maternal Grandfather   . Arthritis Maternal Grandfather   . Depression Maternal Grandfather   . Hearing loss Maternal Grandfather   . Heart disease Maternal Grandfather   . Stroke Maternal Grandfather   . Arthritis Paternal Grandmother   . Arthritis Paternal Grandfather   . Depression Paternal Grandfather   . Heart disease Paternal Grandfather   . Hypertension Paternal Grandfather   . Stroke Paternal Grandfather   . Breast cancer Neg Hx    Social History   Socioeconomic History  . Marital status: Single    Spouse name: Not on file  . Number of children: Not on file  . Years of education: Not on file  . Highest education level: Not on file  Occupational History  . Occupation: work from home, Designer, jewellery - [place]  Tobacco Use  . Smoking status: Never Smoker  . Smokeless tobacco: Never Used  Substance and Sexual Activity  . Alcohol use: Not Currently    Alcohol/week: 0.0 standard drinks  . Drug use:  Not Currently  . Sexual activity: Never  Other Topics Concern  . Not on file  Social History Narrative   Works for [employer] from home    No kids    No guns    Wears seat belt    Safe in relationship    Social Determinants of Radio broadcast assistant Strain: Not on file  Food Insecurity: Not on file  Transportation Needs: Not on file  Physical Activity: Not on file  Stress: Not on file  Social Connections: Not on file  Intimate Partner Violence: Not on file   Current Meds  Medication Sig  . acetaminophen (TYLENOL) 500 MG tablet Take 500 mg by mouth every 6 (six) hours as needed.  . Alpha-Lipoic Acid (LIPOIC ACID PO) Take 600 mg by mouth daily.   . B Complex Vitamins (VITAMIN B COMPLEX PO) Take by mouth.  [name] buPROPion (WELLBUTRIN SR) 150 MG 12 hr tablet Take 1 tablet (150 mg total) by mouth 2 (two) times daily.  . Cholecalciferol (VITAMIN D3) 50 MCG (2000 UT) capsule Take 2,000 Units by mouth daily.  [name] glucosamine-chondroitin 500-400 MG tablet Take 1 tablet by mouth 3 (three) times daily.  [name] loratadine (CLARITIN) 10 MG tablet Take 10 mg by mouth daily.  . medroxyPROGESTERone (PROVERA) 2.5 MG tablet Take 1 tablet (2.5 mg total) by mouth daily.  . metroNIDAZOLE (METROGEL) 0.75 % gel Apply 1 application topically 2 (two) times daily.  . Multiple Vitamin (MULTIVITAMIN) capsule Take 1 capsule by mouth daily.  . Omega-3 Fatty Acids (FISH OIL) 1000 MG CAPS Take 1,000 mg by mouth daily.  . Semaglutide,0.25 or 0.5MG/DOS, (OZEMPIC, 0.25 OR 0.5 MG/DOSE,) 2 MG/1.5ML SOPN Inject 0.5 mg into the skin once a week.  . [DISCONTINUED] FLUoxetine (PROZAC) 20 MG tablet Take 2 tablets (40 mg total) by mouth daily.  . [DISCONTINUED] gabapentin (NEURONTIN) 300 MG capsule Take 1 capsule (300 mg total) by mouth at bedtime.  . [DISCONTINUED] hydrochlorothiazide (HYDRODIURIL) 25 MG tablet Take 1 tablet (25 mg total) by mouth daily. In am  . [DISCONTINUED] lisinopril (ZESTRIL) 30 MG tablet TAKE 1 TABLET BY MOUTH DAILY.  . [DISCONTINUED] metFORMIN (GLUCOPHAGE) 500 MG tablet Take 1 tablet (500 mg total) by mouth daily with breakfast.   Allergies  Allergen Reactions  . Bee Pollen Other (See Comments)  . Dust Mite Extract   . Latex   . Pollen Extract    Recent Results (from the past 2160 hour(s))  Vitamin B12     Status: None   Collection Time: 04/29/20 10:15 AM  Result Value Ref Range   Vitamin B-12 919 232 - 1,245 pg/mL  Insulin, random     Status: None   Collection Time: 04/29/20 10:15 AM  Result Value Ref Range   INSULIN 17.3 2.6 - 24.9 uIU/mL  Hemoglobin A1c     Status: Abnormal   Collection Time: 04/29/20 10:15 AM  Result Value Ref Range   Hgb A1c  MFr Bld 5.9 (H) 4.8 - 5.6 %    Comment:          Prediabetes: 5.7 - 6.4          Diabetes: >6.4          Glycemic control for adults with diabetes: <7.0    Est. average glucose Bld gHb Est-mCnc 123 mg/dL  Lipid Panel With LDL/HDL Ratio     Status: Abnormal   Collection Time: 04/29/20 10:15 AM  Result Value Ref Range   Cholesterol, Total 180 100 -  199 mg/dL   Triglycerides 107 0 - 149 mg/dL   HDL 38 (L) >39 mg/dL   VLDL Cholesterol Cal 20 5 - 40 mg/dL   LDL Chol Calc (NIH) 122 (H) 0 - 99 mg/dL   LDL/HDL Ratio 3.2 0.0 - 3.2 ratio    Comment:                                     LDL/HDL Ratio                                             Men  Women                               1/2 Avg.Risk  1.0    1.5                                   Avg.Risk  3.6    3.2                                2X Avg.Risk  6.2    5.0                                3X Avg.Risk  8.0    6.1   VITAMIN D 25 Hydroxy (Vit-D Deficiency, Fractures)     Status: None   Collection Time: 04/29/20 10:15 AM  Result Value Ref Range   Vit D, 25-Hydroxy 57.3 30.0 - 100.0 ng/mL    Comment: Vitamin D deficiency has been defined by the [place] practice guideline as a level of serum 25-OH vitamin D less than 20 ng/mL (1,2). The Endocrine Society went on to further define vitamin D insufficiency as a level between 21 and 29 ng/mL (2). 1. IOM (Institute of Medicine). 2010. Dietary reference    intakes for calcium and D. [place]: The    [employer]. 2. Holick MF, Binkley Hudson, Bischoff-Ferrari HA, et al.    Evaluation, treatment, and prevention of vitamin D    deficiency: an Endocrine Society clinical practice    guideline. JCEM. 2011 Jul; 96(7):1911-30.   Comprehensive metabolic panel     Status: Abnormal   Collection Time: 04/29/20 10:15 AM  Result Value Ref Range   Glucose 93 65 - 99 mg/dL   BUN 15 6 - 24 mg/dL   Creatinine, Ser 1.07 (H) 0.57 - 1.00 mg/dL   GFR calc  non Af Amer 60 >59 mL/min/1.73   GFR calc Af Amer 69 >59 mL/min/1.73    Comment: **In accordance with recommendations from the NKF-ASN Task force,**   [facility] is in the process of updating its eGFR calculation to the   2021 CKD-EPI creatinine equation that estimates kidney function   without a race variable.    BUN/Creatinine Ratio 14 9 - 23   Sodium 141 134 - 144 mmol/L   Potassium 4.4 3.5 - 5.2 mmol/L   Chloride 102 96 - 106 mmol/L   CO2 21 20 - 29 mmol/L  Calcium 9.2 8.7 - 10.2 mg/dL   Total Protein 6.7 6.0 - 8.5 g/dL   Albumin 4.2 3.8 - 4.9 g/dL   Globulin, Total 2.5 1.5 - 4.5 g/dL   Albumin/Globulin Ratio 1.7 1.2 - 2.2   Bilirubin Total 0.8 0.0 - 1.2 mg/dL   Alkaline Phosphatase 72 44 - 121 IU/L   AST 19 0 - 40 IU/L   ALT 29 0 - 32 IU/L   Objective  Body mass index is 89.02 kg/m. Wt Readings from Last 3 Encounters:  06/16/20 (!) 455 lb 12.8 oz (206.7 kg)  06/07/20 (!) 448 lb (203.2 kg)  05/19/20 (!) 446 lb (202.3 kg)   Temp Readings from Last 3 Encounters:  06/16/20 98.4 F (36.9 C) (Oral)  06/07/20 98.2 F (36.8 C)  05/19/20 97.7 F (36.5 C)   BP Readings from Last 3 Encounters:  06/16/20 118/80  06/07/20 112/68  05/19/20 113/71   Pulse Readings from Last 3 Encounters:  06/16/20 (!) 113  06/07/20 (!) 109  05/19/20 (!) 114    Physical Exam Vitals and nursing note reviewed.  Constitutional:      Appearance: Normal appearance. She is well-developed and well-groomed. She is morbidly obese.  HENT:     Head: Normocephalic and atraumatic.  Eyes:     Conjunctiva/sclera: Conjunctivae normal.     Pupils: Pupils are equal, round, and reactive to light.  Cardiovascular:     Rate and Rhythm: Normal rate and regular rhythm.     Heart sounds: Normal heart sounds. No murmur heard.   Pulmonary:     Effort: Pulmonary effort is normal.     Breath sounds: Normal breath sounds.  Skin:    General: Skin is warm and dry.     Comments: Chronic lymphedema stasis derm  changes b/l legs with redness no cellulitis   Neurological:     General: No focal deficit present.     Mental Status: She is alert and oriented to person, place, and time. Mental status is at baseline.     Gait: Gait normal.  Psychiatric:        Attention and Perception: Attention and perception normal.        Mood and Affect: Mood and affect normal.        Speech: Speech normal.        Behavior: Behavior normal. Behavior is cooperative.        Thought Content: Thought content normal.        Cognition and Memory: Cognition and memory normal.        Judgment: Judgment normal.     Assessment  Plan  Hematuria, unspecified type - Plan: Urinalysis, Routine w reflex microscopic, Urine Culture CT had 06/2019 Consider urology and gyn for US pelvic and transvaginal in future   Venous stasis dermatitis of both lower extremities - Plan: Ambulatory referral to Dermatology Skin cancer screening - Plan: Ambulatory referral to Dermatology Seborrheic dermatitis - Plan: Ambulatory referral to Dermatology  Essential hypertension BP controlled- Plan: hydrochlorothiazide (HYDRODIURIL) 25 MG tablet, lisinopril (ZESTRIL) 30 MG tablet  Anxiety and depression - Plan: FLUoxetine (PROZAC) 40 MG tablet qd wellbutrin sr 150 bid  Pre-diabetes - Plan: metFORMIN (GLUCOPHAGE) 500 MG tablet  Peripheral polyneuropathy - Plan: gabapentin (NEURONTIN) 300 MG capsule  Abnormal CT scan, lumbar spine - Plan: gabapentin (NEURONTIN) 300 MG capsule  Palpitations Consider cards for zio in future   Lymphedema of both lower extremities Consider vascular in hte fuutre   Menopause Disc today estrovan and ambereen  otc supplements has ceiling fan  Other chronic pain  Prn Tylenol   HM Flu shot utd UTD Tdap  rec MMR and hep B vaccine pt to think about covid vx had 3/3 consider 4th dose  Consider shingrix x 2 doses in the future   Declines STD testing  Pap 02/02/16 neg neg HPVf/u [facility] to see if needs to w/u DUB  Pap had 01/14/20 neg neg pap  6/28/19mammogramnegreferredpreviously overdue Reschedule   Derm is Dr. Juanetta Gosling 2020  Colonoscopy consider in future age 89 y.o 12/2020vs cologuard disc at f/u  -message me back when ready vs cologuard disc 12/16/19 and 3/303/22   Rec healthy diet and exercise Provider: Dr. Olivia Mackie McLean-Scocuzza-Internal Medicine

## 2020-06-16 NOTE — Patient Instructions (Addendum)
estrovan or ambereen supplements for menopause over the counter  Let me know when ready to see  GI for colonoscopy  Or  vascular for lymph pumps  Or cardiology    [facility] [place]  [place] [address] [phone]   [facility] [place]  [address]  [place] [address]  [phone] Menopause Menopause is the normal time of a woman's life when menstrual periods stop completely. It marks the natural end to a woman's ability to become pregnant. It can be defined as the absence of a menstrual period for 12 months without another medical cause. The transition to menopause (perimenopause) most often happens between the ages of 41 and 39, and can last for many years. During perimenopause, hormone levels change in your body, which can cause symptoms and affect your health. Menopause may increase your risk for:  Weakened bones (osteoporosis), which causes fractures.  Depression.  Hardening and narrowing of the arteries (atherosclerosis), which can cause heart attacks and strokes. What are the causes? This condition is usually caused by a natural change in hormone levels that happens as you get older. The condition may also be caused by changes that are not natural, including:  Surgery to remove both ovaries (surgical menopause).  Side effects from some medicines, such as chemotherapy used to treat cancer (chemical menopause). What increases the risk? This condition is more likely to start at an earlier age if you have certain medical conditions or have undergone treatments, including:  A tumor of the pituitary gland in the brain.  A disease that affects the ovaries and hormones.  Certain cancer treatments, such as chemotherapy or hormone therapy, or radiation therapy on the pelvis.  Heavy smoking and excessive alcohol use.  Family history of early menopause. This condition is also  more likely to develop earlier in women who are very thin. What are the signs or symptoms? Symptoms of this condition include:  Hot flashes.  Irregular menstrual periods.  Night sweats.  Changes in feelings about sex. This could be a decrease in sex drive or an increased discomfort around your sexuality.  Vaginal dryness and thinning of the vaginal walls. This may cause painful sex.  Dryness of the skin and development of wrinkles.  Headaches.  Problems sleeping (insomnia).  Mood swings or irritability.  Memory problems.  Weight gain.  Hair growth on the face and chest.  Bladder infections or problems with urinating. How is this diagnosed? This condition is diagnosed based on your medical history, a physical exam, your age, your menstrual history, and your symptoms. Hormone tests may also be done. How is this treated? In some cases, no treatment is needed. You and your health care provider should make a decision together about whether treatment is necessary. Treatment will be based on your individual condition and preferences. Treatment for this condition focuses on managing symptoms. Treatment may include:  Menopausal hormone therapy (MHT).  Medicines to treat specific symptoms or complications.  Acupuncture.  Vitamin or herbal supplements. Before starting treatment, make sure to let your health care provider know if you have a personal or family history of these conditions:  Heart disease.  Breast cancer.  Blood clots.  Diabetes.  Osteoporosis. Follow these instructions at home: Lifestyle  Do not use any products that contain nicotine or tobacco, such as cigarettes, e-cigarettes, and chewing tobacco. If you need help quitting, ask your health care provider.  Get at least  30 minutes of physical activity on 5 or more days each week.  Avoid alcoholic and caffeinated beverages, as well as spicy foods. This may help prevent hot flashes.  Get 7-8 hours of  sleep each night.  If you have hot flashes, try: ? Dressing in layers. ? Avoiding things that may trigger hot flashes, such as spicy food, warm places, or stress. ? Taking slow, deep breaths when a hot flash starts. ? Keeping a fan in your home and office.  Find ways to manage stress, such as deep breathing, meditation, or journaling.  Consider going to group therapy with other women who are having menopause symptoms. Ask your health care provider about recommended group therapy meetings. Eating and drinking  Eat a healthy, balanced diet that contains whole grains, lean protein, low-fat dairy, and plenty of fruits and vegetables.  Your health care provider may recommend adding more soy to your diet. Foods that contain soy include tofu, tempeh, and soy milk.  Eat plenty of foods that contain calcium and vitamin D for bone health. Items that are rich in calcium include low-fat milk, yogurt, beans, almonds, sardines, broccoli, and kale.   Medicines  Take over-the-counter and prescription medicines only as told by your health care provider.  Talk with your health care provider before starting any herbal supplements. If prescribed, take vitamins and supplements as told by your health care provider. General instructions  Keep track of your menstrual periods, including: ? When they occur. ? How heavy they are and how long they last. ? How much time passes between periods.  Keep track of your symptoms, noting when they start, how often you have them, and how long they last.  Use vaginal lubricants or moisturizers to help with vaginal dryness and improve comfort during sex.  Keep all follow-up visits. This is important. This includes any group therapy or counseling.   Contact a health care provider if:  You are still having menstrual periods after age 30.  You have pain during sex.  You have not had a period for 12 months and you develop vaginal bleeding. Get help right away if you  have:  Severe depression.  Excessive vaginal bleeding.  Pain when you urinate.  A fast or irregular heartbeat (palpitations).  Severe headaches.  Abdominal pain or severe indigestion. Summary  Menopause is a normal time of life when menstrual periods stop completely. It is usually defined as the absence of a menstrual period for 12 months without another medical cause.  The transition to menopause (perimenopause) most often happens between the ages of 32 and 29 and can last for several years.  Symptoms can be managed through medicines, lifestyle changes, and complementary therapies such as acupuncture.  Eat a balanced diet that is rich in nutrients to promote bone health and heart health and to manage symptoms during menopause. This information is not intended to replace advice given to you by your health care provider. Make sure you discuss any questions you have with your health care provider. Document Revised: 12/05/2019 Document Reviewed: 08/21/2019 Elsevier Patient Education  2021 [place].    Colonoscopy, Adult A colonoscopy is a procedure to look at the entire large intestine. This procedure is done using a long, thin, flexible tube that has a camera on the end. You may have a colonoscopy:  As a part of normal colorectal screening.  If you have certain symptoms, such as: ? A low number of red blood cells in your blood (anemia). ? Diarrhea that  does not go away. ? Pain in your abdomen. ? Blood in your stool. A colonoscopy can help screen for and diagnose medical problems, including:  Tumors.  Extra tissue that grows where mucus forms (polyps).  Inflammation.  Areas of bleeding. Tell your health care provider about:  Any allergies you have.  All medicines you are taking, including vitamins, herbs, eye drops, creams, and over-the-counter medicines.  Any problems you or family members have had with anesthetic medicines.  Any blood disorders you  have.  Any surgeries you have had.  Any medical conditions you have.  Any problems you have had with having bowel movements.  Whether you are pregnant or may be pregnant. What are the risks? Generally, this is a safe procedure. However, problems may occur, including:  Bleeding.  Damage to your intestine.  Allergic reactions to medicines given during the procedure.  Infection. This is rare. What happens before the procedure? Eating and drinking restrictions Follow instructions from your health care provider about eating or drinking restrictions, which may include:  A few days before the procedure: ? Follow a low-fiber diet. ? Avoid nuts, seeds, dried fruit, raw fruits, and vegetables.  1-3 days before the procedure: ? Eat only gelatin dessert or ice pops. ? Drink only clear liquids, such as water, clear juice, clear broth or bouillon, black coffee or tea, or clear soft drinks or sports drinks. ? Avoid liquids that contain red or purple dye.  The day of the procedure: ? Do not eat solid foods. You may continue to drink clear liquids until up to 2 hours before the procedure. ? Do not eat or drink anything starting 2 hours before the procedure, or within the time period that your health care provider recommends. Bowel prep If you were prescribed a bowel prep to take by mouth (orally) to clean out your colon:  Take it as told by your health care provider. Starting the day before your procedure, you will need to drink a large amount of liquid medicine. The liquid will cause you to have many bowel movements of loose stool until your stool becomes almost clear or light green.  If your skin or the opening between the buttocks (anus) gets irritated from diarrhea, you may relieve the irritation using: ? Wipes with medicine in them, such as adult wet wipes with aloe and vitamin E. ? A product to soothe skin, such as petroleum jelly.  If you vomit while drinking the bowel prep: ? Take  a break for up to 60 minutes. ? Begin the bowel prep again. ? Call your health care provider if you keep vomiting or you cannot take the bowel prep without vomiting.  To clean out your colon, you may also be given: ? Laxative medicines. These help you have a bowel movement. ? Instructions for enema use. An enema is liquid medicine injected into your rectum. Medicines Ask your health care provider about:  Changing or stopping your regular medicines or supplements. This is especially important if you are taking iron supplements, diabetes medicines, or blood thinners.  Taking medicines such as aspirin and ibuprofen. These medicines can thin your blood. Do not take these medicines unless your health care provider tells you to take them.  Taking over-the-counter medicines, vitamins, herbs, and supplements. General instructions  Ask your health care provider what steps will be taken to help prevent infection. These may include washing skin with a germ-killing soap.  Plan to have someone take you home from the hospital or clinic.  What happens during the procedure?  An IV will be inserted into one of your veins.  You may be given one or more of the following: ? A medicine to help you relax (sedative). ? A medicine to numb the area (local anesthetic). ? A medicine to make you fall asleep (general anesthetic). This is rarely needed.  You will lie on your side with your knees bent.  The tube will: ? Have oil or gel put on it (be lubricated). ? Be inserted into your anus. ? Be gently eased through all parts of your large intestine.  Air will be sent into your colon to keep it open. This may cause some pressure or cramping.  Images will be taken with the camera and will appear on a screen.  A small tissue sample may be removed to be looked at under a microscope (biopsy). The tissue may be sent to a lab for testing if any signs of problems are found.  If small polyps are found, they may  be removed and checked for cancer cells.  When the procedure is finished, the tube will be removed. The procedure may vary among health care providers and hospitals.   What happens after the procedure?  Your blood pressure, heart rate, breathing rate, and blood oxygen level will be monitored until you leave the hospital or clinic.  You may have a small amount of blood in your stool.  You may pass gas and have mild cramping or bloating in your abdomen. This is caused by the air that was used to open your colon during the exam.  Do not drive for 24 hours after the procedure.  It is up to you to get the results of your procedure. Ask your health care provider, or the department that is doing the procedure, when your results will be ready. Summary  A colonoscopy is a procedure to look at the entire large intestine.  Follow instructions from your health care provider about eating and drinking before the procedure.  If you were prescribed an oral bowel prep to clean out your colon, take it as told by your health care provider.  During the colonoscopy, a flexible tube with a camera on its end is inserted into the anus and then passed into the other parts of the large intestine. This information is not intended to replace advice given to you by your health care provider. Make sure you discuss any questions you have with your health care provider. Document Revised: 09/27/2018 Document Reviewed: 09/27/2018 Elsevier Patient Education  2021 [place]. Zoster Vaccine, Recombinant injection What is this medicine? ZOSTER VACCINE (ZOS ter vak SEEN) is a vaccine used to reduce the risk of getting shingles. This vaccine is not used to treat shingles or nerve pain from shingles. This medicine may be used for other purposes; ask your health care provider or pharmacist if you have questions. COMMON BRAND NAME(S): [facility] What should I tell my health care provider before I take this medicine? They  need to know if you have any of these conditions:  cancer  immune system problems  an unusual or allergic reaction to Zoster vaccine, other medications, foods, dyes, or preservatives  pregnant or trying to get pregnant  breast-feeding How should I use this medicine? This vaccine is injected into a muscle. It is given by a health care provider. A copy of Vaccine Information Statements will be given before each vaccination. Be sure to read this information carefully each time. This sheet may  change often. Talk to your health care provider about the use of this vaccine in children. This vaccine is not approved for use in children. Overdosage: If you think you have taken too much of this medicine contact a poison control center or emergency room at once. NOTE: This medicine is only for you. Do not share this medicine with others. What if I miss a dose? Keep appointments for follow-up (booster) doses. It is important not to miss your dose. Call your health care provider if you are unable to keep an appointment. What may interact with this medicine?  medicines that suppress your immune system  medicines to treat cancer  steroid medicines like prednisone or cortisone This list may not describe all possible interactions. Give your health care provider a list of all the medicines, herbs, non-prescription drugs, or dietary supplements you use. Also tell them if you smoke, drink alcohol, or use illegal drugs. Some items may interact with your medicine. What should I watch for while using this medicine? Visit your health care provider regularly. This vaccine, like all vaccines, may not fully protect everyone. What side effects may I notice from receiving this medicine? Side effects that you should report to your doctor or health care professional as soon as possible:  allergic reactions (skin rash, itching or hives; swelling of the face, lips, or tongue)  trouble breathing Side effects that  usually do not require medical attention (report these to your doctor or health care professional if they continue or are bothersome):  chills  headache  fever  nausea  pain, redness, or irritation at site where injected  tiredness  vomiting This list may not describe all possible side effects. Call your doctor for medical advice about side effects. You may report side effects to FDA at 1-800-FDA-1088. Where should I keep my medicine? This vaccine is only given by a health care provider. It will not be stored at home. NOTE: This sheet is a summary. It may not cover all possible information. If you have questions about this medicine, talk to your doctor, pharmacist, or health care provider.  2021 Elsevier/Gold Standard (2019-04-11 16:23:07)  Recombinant Zoster (Shingles) Vaccine: What You Need to Know 1. Why get vaccinated? Recombinant zoster (shingles) vaccine can prevent shingles. Shingles (also called herpes zoster, or just zoster) is a painful skin rash, usually with blisters. In addition to the rash, shingles can cause fever, headache, chills, or upset stomach. More rarely, shingles can lead to pneumonia, hearing problems, blindness, brain inflammation (encephalitis), or death. The most common complication of shingles is long-term nerve pain called postherpetic neuralgia (PHN). PHN occurs in the areas where the shingles rash was, even after the rash clears up. It can last for months or years after the rash goes away. The pain from PHN can be severe and debilitating. About 10 to 18% of people who get shingles will experience PHN. The risk of PHN increases with age. An older adult with shingles is more likely to develop PHN and have longer lasting and more severe pain than a younger person with shingles. Shingles is caused by the varicella zoster virus, the same virus that causes chickenpox. After you have chickenpox, the virus stays in your body and can cause shingles later in life.  Shingles cannot be passed from one person to another, but the virus that causes shingles can spread and cause chickenpox in someone who had never had chickenpox or received chickenpox vaccine. 2. Recombinant shingles vaccine Recombinant shingles vaccine provides strong protection  against shingles. By preventing shingles, recombinant shingles vaccine also protects against PHN. Recombinant shingles vaccine is the preferred vaccine for the prevention of shingles. However, a different vaccine, live shingles vaccine, may be used in some circumstances. The recombinant shingles vaccine is recommended for adults 50 years and older without serious immune problems. It is given as a two-dose series. This vaccine is also recommended for people who have already gotten another type of shingles vaccine, the live shingles vaccine. There is no live virus in this vaccine. Shingles vaccine may be given at the same time as other vaccines. 3. Talk with your health care provider Tell your vaccine provider if the person getting the vaccine:  Has had an allergic reaction after a previous dose of recombinant shingles vaccine, or has any severe, life-threatening allergies.  Is pregnant or breastfeeding.  Is currently experiencing an episode of shingles. In some cases, your health care provider may decide to postpone shingles vaccination to a future visit. People with minor illnesses, such as a cold, may be vaccinated. People who are moderately or severely ill should usually wait until they recover before getting recombinant shingles vaccine. Your health care provider can give you more information. 4. Risks of a vaccine reaction  A sore arm with mild or moderate pain is very common after recombinant shingles vaccine, affecting about 80% of vaccinated people. Redness and swelling can also happen at the site of the injection.  Tiredness, muscle pain, headache, shivering, fever, stomach pain, and nausea happen after  vaccination in more than half of people who receive recombinant shingles vaccine. In clinical trials, about 1 out of 6 people who got recombinant zoster vaccine experienced side effects that prevented them from doing regular activities. Symptoms usually went away on their own in 2 to 3 days. You should still get the second dose of recombinant zoster vaccine even if you had one of these reactions after the first dose. People sometimes faint after medical procedures, including vaccination. Tell your provider if you feel dizzy or have vision changes or ringing in the ears. As with any medicine, there is a very remote chance of a vaccine causing a severe allergic reaction, other serious injury, or death. 5. What if there is a serious problem? An allergic reaction could occur after the vaccinated person leaves the clinic. If you see signs of a severe allergic reaction (hives, swelling of the face and throat, difficulty breathing, a fast heartbeat, dizziness, or weakness), call 9-1-1 and get the person to the nearest hospital. For other signs that concern you, call your health care provider. Adverse reactions should be reported to the Vaccine Adverse Event Reporting System (VAERS). Your health care provider will usually file this report, or you can do it yourself. Visit the VAERS website at www.vaers.SamedayNews.es or call 906-110-2173. VAERS is only for reporting reactions, and VAERS staff do not give medical advice. 6. How can I learn more?  Ask your health care provider.  Call your local or state health department.  Contact the Centers for Disease Control and Prevention (CDC): ? Call 531-457-0723 (1-800-CDC-INFO) or ? Visit CDC's website at http://hunter.com/ Vaccine Information Statement Recombinant Zoster Vaccine (01/16/2018) This information is not intended to replace advice given to you by your health care provider. Make sure you discuss any questions you have with your health care  provider. Document Revised: 11/07/2019 Document Reviewed: 11/07/2019 Elsevier Patient Education  [place].

## 2020-06-17 LAB — URINALYSIS, ROUTINE W REFLEX MICROSCOPIC
Bilirubin, UA: NEGATIVE
Glucose, UA: NEGATIVE
Nitrite, UA: NEGATIVE
RBC, UA: NEGATIVE
Specific Gravity, UA: 1.026 (ref 1.005–1.030)
Urobilinogen, Ur: 0.2 mg/dL (ref 0.2–1.0)
pH, UA: 5.5 (ref 5.0–7.5)

## 2020-06-17 LAB — MICROSCOPIC EXAMINATION: Casts: NONE SEEN /lpf

## 2020-06-19 ENCOUNTER — Other Ambulatory Visit: Payer: Self-pay

## 2020-06-19 LAB — URINE CULTURE

## 2020-06-19 MED FILL — Gabapentin Cap 300 MG: ORAL | 90 days supply | Qty: 90 | Fill #0 | Status: CN

## 2020-06-21 ENCOUNTER — Other Ambulatory Visit (HOSPITAL_COMMUNITY): Payer: Self-pay

## 2020-06-21 ENCOUNTER — Other Ambulatory Visit: Payer: Self-pay

## 2020-06-21 ENCOUNTER — Ambulatory Visit (INDEPENDENT_AMBULATORY_CARE_PROVIDER_SITE_OTHER): Payer: 59 | Admitting: Bariatrics

## 2020-06-21 MED FILL — Gabapentin Cap 300 MG: ORAL | 90 days supply | Qty: 90 | Fill #0 | Status: AC

## 2020-06-21 MED FILL — Medroxyprogesterone Acetate Tab 2.5 MG: ORAL | 90 days supply | Qty: 90 | Fill #0 | Status: AC

## 2020-06-22 ENCOUNTER — Other Ambulatory Visit (HOSPITAL_COMMUNITY): Payer: Self-pay

## 2020-06-23 ENCOUNTER — Other Ambulatory Visit (HOSPITAL_COMMUNITY): Payer: Self-pay

## 2020-06-24 ENCOUNTER — Other Ambulatory Visit (HOSPITAL_COMMUNITY): Payer: Self-pay

## 2020-06-24 ENCOUNTER — Telehealth (INDEPENDENT_AMBULATORY_CARE_PROVIDER_SITE_OTHER): Payer: 59 | Admitting: Psychology

## 2020-06-29 DIAGNOSIS — G4733 Obstructive sleep apnea (adult) (pediatric): Secondary | ICD-10-CM | POA: Diagnosis not present

## 2020-07-01 ENCOUNTER — Other Ambulatory Visit: Payer: Self-pay

## 2020-07-01 ENCOUNTER — Encounter (INDEPENDENT_AMBULATORY_CARE_PROVIDER_SITE_OTHER): Payer: Self-pay | Admitting: Bariatrics

## 2020-07-01 ENCOUNTER — Ambulatory Visit (INDEPENDENT_AMBULATORY_CARE_PROVIDER_SITE_OTHER): Payer: 59 | Admitting: Bariatrics

## 2020-07-01 VITALS — BP 128/78 | HR 72 | Temp 98.1°F | Ht 60.0 in | Wt >= 6400 oz

## 2020-07-01 DIAGNOSIS — R7303 Prediabetes: Secondary | ICD-10-CM

## 2020-07-01 DIAGNOSIS — Z9189 Other specified personal risk factors, not elsewhere classified: Secondary | ICD-10-CM

## 2020-07-01 DIAGNOSIS — I10 Essential (primary) hypertension: Secondary | ICD-10-CM

## 2020-07-01 DIAGNOSIS — Z6841 Body Mass Index (BMI) 40.0 and over, adult: Secondary | ICD-10-CM

## 2020-07-01 DIAGNOSIS — F3289 Other specified depressive episodes: Secondary | ICD-10-CM

## 2020-07-01 MED ORDER — BUPROPION HCL ER (SR) 150 MG PO TB12
ORAL_TABLET | Freq: Two times a day (BID) | ORAL | 0 refills | Status: DC
  Filled 2020-07-01 – 2020-07-12 (×2): qty 60, 30d supply, fill #0

## 2020-07-01 MED ORDER — SEMAGLUTIDE(0.25 OR 0.5MG/DOS) 2 MG/1.5ML ~~LOC~~ SOPN
0.5000 mg | PEN_INJECTOR | SUBCUTANEOUS | 0 refills | Status: DC
  Filled 2020-07-01 – 2020-07-12 (×2): qty 1.5, 28d supply, fill #0

## 2020-07-06 ENCOUNTER — Encounter (INDEPENDENT_AMBULATORY_CARE_PROVIDER_SITE_OTHER): Payer: Self-pay | Admitting: Bariatrics

## 2020-07-06 ENCOUNTER — Other Ambulatory Visit: Payer: Self-pay

## 2020-07-06 NOTE — Progress Notes (Signed)
Chief Complaint:   OBESITY Belinda Garrett is here to discuss her progress with her obesity treatment plan along with follow-up of her obesity related diagnoses. Belinda Garrett is on the Category 4 Plan and states she is following her eating plan approximately 45% of the time. Belinda Garrett states she is not exercising regularly at this time.  Today's visit was #: 39 Starting weight: 454 lbs Starting date: 12/24/2017 Today's weight: 441 lbs Today's date: 07/01/2020 Total lbs lost to date: 13 lbs Total lbs lost since last in-office visit: 7 lbs  Interim History: Belinda Garrett is down an additional 7 pounds since her last visit.  Subjective:   1. Pre-diabetes Belinda Garrett has a diagnosis of prediabetes based on her elevated HgA1c and was informed this puts her at greater risk of developing diabetes. She continues to work on diet and exercise to decrease her risk of diabetes. She denies nausea or hypoglycemia.  She is taking metformin and Ozempic.  She has decreased her portion sizes.  Lab Results  Component Value Date   HGBA1C 5.9 (H) 04/29/2020   Lab Results  Component Value Date   INSULIN 17.3 04/29/2020   INSULIN 31.4 (H) 10/20/2019   INSULIN 37.1 (H) 12/24/2017   2. Essential hypertension Controlled.  Review: taking medications as instructed, no medication side effects noted, no chest pain on exertion, no dyspnea on exertion, no swelling of ankles.    BP Readings from Last 3 Encounters:  07/01/20 128/78  06/16/20 118/80  06/07/20 112/68   3. Other depression, with emotional eating She is taking Wellbutrin and says it is working well.  4. At risk for nausea Kendalynn is at risk for nausea due to taking Ozempic.  Assessment/Plan:   1. Pre-diabetes Belinda Garrett will continue to work on weight loss, exercise, and decreasing simple carbohydrates to help decrease the risk of diabetes.  Continue medications.  She will make better choices.  - Refill Semaglutide,0.25 or 0.5MG /DOS, 2 MG/1.5ML SOPN; INJECT 0.5 MG  INTO THE SKIN ONCE A WEEK.  Dispense: 1.5 mL; Refill: 0  2. Essential hypertension Belinda Garrett is working on healthy weight loss and exercise to improve blood pressure control. We will watch for signs of hypotension as she continues her lifestyle modifications.  Continue medications.  3. Other depression, with emotional eating Continue Wellbutrin.  Will refill today, as per below. - Refill buPROPion (WELLBUTRIN SR) 150 MG 12 hr tablet; TAKE 1 TABLET BY MOUTH TWICE DAILY  Dispense: 60 tablet; Refill: 0  4. At risk for nausea Belinda Garrett was given approximately 15 minutes of nausea prevention counseling today. Lillie is at risk for nausea due to her new or current medication. She was encouraged to titrate her medication slowly, make sure to stay hydrated, eat smaller portions throughout the day, and avoid high fat meals.   5. Obesity with current BMI 86.13  Belinda Garrett is currently in the action stage of change. As such, her goal is to continue with weight loss efforts. She has agreed to the Category 4 Plan.   She will work on meal planning and intentional eating.  Exercise goals: All adults should avoid inactivity. Some physical activity is better than none, and adults who participate in any amount of physical activity gain some health benefits.  Behavioral modification strategies: increasing lean protein intake, decreasing simple carbohydrates, increasing vegetables, increasing water intake, decreasing eating out, no skipping meals, meal planning and cooking strategies, keeping healthy foods in the home and planning for success.  Belinda Garrett has agreed to follow-up with  our clinic in 2-3 weeks. She was informed of the importance of frequent follow-up visits to maximize her success with intensive lifestyle modifications for her multiple health conditions.   Objective:   Blood pressure 128/78, pulse 72, temperature 98.1 F (36.7 C), height 5' (1.524 m), weight (!) 441 lb (200 kg), SpO2 96 %. Body mass  index is 86.13 kg/m.  General: Cooperative, alert, well developed, in no acute distress. HEENT: Conjunctivae and lids unremarkable. Cardiovascular: Regular rhythm.  Lungs: Normal work of breathing. Neurologic: No focal deficits.   Lab Results  Component Value Date   CREATININE 1.07 (H) 04/29/2020   BUN 15 04/29/2020   NA 141 04/29/2020   K 4.4 04/29/2020   CL 102 04/29/2020   CO2 21 04/29/2020   Lab Results  Component Value Date   ALT 29 04/29/2020   AST 19 04/29/2020   ALKPHOS 72 04/29/2020   BILITOT 0.8 04/29/2020   Lab Results  Component Value Date   HGBA1C 5.9 (H) 04/29/2020   HGBA1C 6.1 (H) 10/20/2019   HGBA1C 6.0 06/04/2019   HGBA1C 5.6 09/25/2018   HGBA1C 5.8 (H) 12/24/2017   Lab Results  Component Value Date   INSULIN 17.3 04/29/2020   INSULIN 31.4 (H) 10/20/2019   INSULIN 37.1 (H) 12/24/2017   Lab Results  Component Value Date   TSH 2.030 10/20/2019   Lab Results  Component Value Date   CHOL 180 04/29/2020   HDL 38 (L) 04/29/2020   LDLCALC 122 (H) 04/29/2020   TRIG 107 04/29/2020   CHOLHDL 5 06/04/2019   Lab Results  Component Value Date   WBC 8.7 06/04/2019   HGB 12.5 06/04/2019   HCT 38.5 06/04/2019   MCV 79.7 06/04/2019   PLT 217.0 06/04/2019   Attestation Statements:   Reviewed by clinician on day of visit: allergies, medications, problem list, medical history, surgical history, family history, social history, and previous encounter notes.  I, Water quality scientist, CMA, am acting as Location manager for [employer], DO  I have reviewed the above documentation for accuracy and completeness, and I agree with the above. Jearld Lesch, DO

## 2020-07-08 ENCOUNTER — Ambulatory Visit (INDEPENDENT_AMBULATORY_CARE_PROVIDER_SITE_OTHER): Payer: 59 | Admitting: Family Medicine

## 2020-07-08 ENCOUNTER — Other Ambulatory Visit: Payer: Self-pay

## 2020-07-12 ENCOUNTER — Other Ambulatory Visit (HOSPITAL_COMMUNITY): Payer: Self-pay

## 2020-07-20 ENCOUNTER — Ambulatory Visit (INDEPENDENT_AMBULATORY_CARE_PROVIDER_SITE_OTHER): Payer: 59 | Admitting: Bariatrics

## 2020-07-20 ENCOUNTER — Encounter (INDEPENDENT_AMBULATORY_CARE_PROVIDER_SITE_OTHER): Payer: Self-pay | Admitting: Bariatrics

## 2020-07-20 ENCOUNTER — Other Ambulatory Visit: Payer: Self-pay

## 2020-07-20 VITALS — BP 139/77 | HR 90 | Temp 98.3°F | Ht 60.0 in | Wt >= 6400 oz

## 2020-07-20 DIAGNOSIS — R7303 Prediabetes: Secondary | ICD-10-CM | POA: Diagnosis not present

## 2020-07-20 DIAGNOSIS — I10 Essential (primary) hypertension: Secondary | ICD-10-CM | POA: Diagnosis not present

## 2020-07-20 DIAGNOSIS — Z6841 Body Mass Index (BMI) 40.0 and over, adult: Secondary | ICD-10-CM

## 2020-07-22 NOTE — Progress Notes (Signed)
Chief Complaint:   OBESITY Belinda Garrett is here to discuss her progress with her obesity treatment plan along with follow-up of her obesity related diagnoses. Belinda Garrett is on the Category 4 Plan and states she is following her eating plan approximately 25% of the time. Belinda Garrett states she is not currently exercising.  Today's visit was #: 20 Starting weight: 454 lbs Starting date: 12/24/2017 Today's weight: 446 lbs Today's date: 07/21/2020 Total lbs lost to date: 8 lbs Total lbs lost since last in-office visit: 0  Interim History: Belinda Garrett is up 5 lbs since her last visit. She went about 1 and 1/2  weeks without the Ozempic and Wellbutrin.  Subjective:   1. Essential hypertension Belinda Garrett's BP is reasonably well controlled.   BP Readings from Last 3 Encounters:  07/20/20 139/77  07/01/20 128/78  06/16/20 118/80   2. Pre-diabetes Belinda Garrett is taking Ozempic 0.5 mg and denies side effects.   Lab Results  Component Value Date   HGBA1C 5.9 (H) 04/29/2020   Lab Results  Component Value Date   INSULIN 17.3 04/29/2020   INSULIN 31.4 (H) 10/20/2019   INSULIN 37.1 (H) 12/24/2017    Assessment/Plan:   1. Essential hypertension Belinda Garrett is working on healthy weight loss and exercise to improve blood pressure control. We will watch for signs of hypotension as she continues her lifestyle modifications. Continue current treatment plan.  2. Pre-diabetes Belinda Garrett will continue to work on weight loss, exercise, and decreasing simple carbohydrates to help decrease the risk of diabetes. Continue current treatment plan.  3. Obesity, current BMI 87  Belinda Garrett is currently in the action stage of change. As such, her goal is to continue with weight loss efforts. She has agreed to the Category 4 Plan.   Meal plan Will adhere closely to the plan.  Exercise goals: As is  Behavioral modification strategies: increasing lean protein intake, decreasing simple carbohydrates, increasing vegetables, increasing  water intake, decreasing eating out, no skipping meals, meal planning and cooking strategies, keeping healthy foods in the home and planning for success.  Belinda Garrett has agreed to follow-up with our clinic in 2 weeks. She was informed of the importance of frequent follow-up visits to maximize her success with intensive lifestyle modifications for her multiple health conditions.   Objective:   Blood pressure 139/77, pulse 90, temperature 98.3 F (36.8 C), height 5' (1.524 m), weight (!) 446 lb (202.3 kg), SpO2 95 %. Body mass index is 87.1 kg/m.  General: Cooperative, alert, well developed, in no acute distress. HEENT: Conjunctivae and lids unremarkable. Cardiovascular: Regular rhythm.  Lungs: Normal work of breathing. Neurologic: No focal deficits.   Lab Results  Component Value Date   CREATININE 1.07 (H) 04/29/2020   BUN 15 04/29/2020   NA 141 04/29/2020   K 4.4 04/29/2020   CL 102 04/29/2020   CO2 21 04/29/2020   Lab Results  Component Value Date   ALT 29 04/29/2020   AST 19 04/29/2020   ALKPHOS 72 04/29/2020   BILITOT 0.8 04/29/2020   Lab Results  Component Value Date   HGBA1C 5.9 (H) 04/29/2020   HGBA1C 6.1 (H) 10/20/2019   HGBA1C 6.0 06/04/2019   HGBA1C 5.6 09/25/2018   HGBA1C 5.8 (H) 12/24/2017   Lab Results  Component Value Date   INSULIN 17.3 04/29/2020   INSULIN 31.4 (H) 10/20/2019   INSULIN 37.1 (H) 12/24/2017   Lab Results  Component Value Date   TSH 2.030 10/20/2019   Lab Results  Component Value Date  CHOL 180 04/29/2020   HDL 38 (L) 04/29/2020   LDLCALC 122 (H) 04/29/2020   TRIG 107 04/29/2020   CHOLHDL 5 06/04/2019   Lab Results  Component Value Date   WBC 8.7 06/04/2019   HGB 12.5 06/04/2019   HCT 38.5 06/04/2019   MCV 79.7 06/04/2019   PLT 217.0 06/04/2019    Attestation Statements:   Reviewed by clinician on day of visit: allergies, medications, problem list, medical history, surgical history, family history, social history, and  previous encounter notes.  Time spent on visit including pre-visit chart review and post-visit care and charting was 20 minutes.   Coral Ceo, CMA, am acting as Location manager for [employer], DO.  I have reviewed the above documentation for accuracy and completeness, and I agree with the above. Jearld Lesch, DO

## 2020-07-26 ENCOUNTER — Encounter (INDEPENDENT_AMBULATORY_CARE_PROVIDER_SITE_OTHER): Payer: Self-pay | Admitting: Bariatrics

## 2020-07-26 ENCOUNTER — Other Ambulatory Visit: Payer: Self-pay

## 2020-07-26 ENCOUNTER — Telehealth: Payer: Self-pay | Admitting: Internal Medicine

## 2020-07-26 ENCOUNTER — Ambulatory Visit (INDEPENDENT_AMBULATORY_CARE_PROVIDER_SITE_OTHER): Payer: 59 | Admitting: Bariatrics

## 2020-07-26 MED FILL — Metformin HCl Tab 500 MG: ORAL | 90 days supply | Qty: 90 | Fill #0 | Status: AC

## 2020-07-26 NOTE — Telephone Encounter (Signed)
Okay to change? 

## 2020-07-26 NOTE — Telephone Encounter (Signed)
Patient called wanted prescription to be re written for capsule because they are cheaper than the tablet forFLUoxetine (PROZAC) 20 MG tablet

## 2020-07-27 ENCOUNTER — Ambulatory Visit: Payer: 59 | Attending: Internal Medicine

## 2020-07-27 ENCOUNTER — Other Ambulatory Visit: Payer: Self-pay | Admitting: Internal Medicine

## 2020-07-27 ENCOUNTER — Other Ambulatory Visit: Payer: Self-pay

## 2020-07-27 DIAGNOSIS — F32A Depression, unspecified: Secondary | ICD-10-CM

## 2020-07-27 DIAGNOSIS — F419 Anxiety disorder, unspecified: Secondary | ICD-10-CM

## 2020-07-27 DIAGNOSIS — Z23 Encounter for immunization: Secondary | ICD-10-CM

## 2020-07-27 MED ORDER — FLUOXETINE HCL 40 MG PO CAPS
40.0000 mg | ORAL_CAPSULE | Freq: Every day | ORAL | 3 refills | Status: DC
  Filled 2020-07-27 – 2020-07-29 (×2): qty 90, 90d supply, fill #0
  Filled 2020-10-18: qty 90, 90d supply, fill #1
  Filled 2021-01-25: qty 90, 90d supply, fill #2
  Filled 2021-04-19: qty 90, 90d supply, fill #3

## 2020-07-27 NOTE — Progress Notes (Signed)
   Covid-19 Vaccination Clinic  Name:  SUMEYA YONTZ    MRN: [id number] DOB: [birth year]  07/27/2020  Ms. Mccorvey was observed post Covid-19 immunization for 15 minutes without incident. She was provided with Vaccine Information Sheet and instruction to access the V-Safe system.   Ms. Bickle was instructed to call 911 with any severe reactions post vaccine: [name] Difficulty breathing  . Swelling of face and throat  . A fast heartbeat  . A bad rash all over body  . Dizziness and weakness   Immunizations Administered    Name Date Dose VIS Date Route   PFIZER Comrnaty(Gray TOP) Covid-19 Vaccine 07/27/2020 10:42 AM 0.3 mL 02/26/2020 Intramuscular   Manufacturer: [employer]   Lot: [id number]   NDC: [place], PharmD, MBA Clinical Acute Care Pharmacist

## 2020-07-28 ENCOUNTER — Other Ambulatory Visit: Payer: Self-pay

## 2020-07-28 MED ORDER — PFIZER-BIONT COVID-19 VAC-TRIS 30 MCG/0.3ML IM SUSP
INTRAMUSCULAR | 0 refills | Status: DC
  Filled 2020-07-28: qty 0.3, 20d supply, fill #0

## 2020-07-29 ENCOUNTER — Other Ambulatory Visit: Payer: Self-pay

## 2020-07-29 ENCOUNTER — Other Ambulatory Visit (HOSPITAL_COMMUNITY): Payer: Self-pay

## 2020-07-29 ENCOUNTER — Encounter: Payer: Self-pay | Admitting: Internal Medicine

## 2020-07-29 DIAGNOSIS — G4733 Obstructive sleep apnea (adult) (pediatric): Secondary | ICD-10-CM | POA: Diagnosis not present

## 2020-07-30 NOTE — Telephone Encounter (Signed)
Please advise 

## 2020-08-03 ENCOUNTER — Other Ambulatory Visit: Payer: Self-pay

## 2020-08-03 ENCOUNTER — Telehealth: Payer: Self-pay | Admitting: Internal Medicine

## 2020-08-03 ENCOUNTER — Ambulatory Visit: Payer: 59 | Admitting: Internal Medicine

## 2020-08-03 ENCOUNTER — Ambulatory Visit
Admission: RE | Admit: 2020-08-03 | Discharge: 2020-08-03 | Disposition: A | Discharge status: Home / Self Care | Payer: 59 | Source: Ambulatory Visit | Attending: Internal Medicine | Admitting: Internal Medicine

## 2020-08-03 ENCOUNTER — Encounter: Payer: Self-pay | Admitting: Internal Medicine

## 2020-08-03 VITALS — BP 132/84 | HR 101 | Temp 98.0°F | Ht 60.0 in | Wt >= 6400 oz

## 2020-08-03 DIAGNOSIS — K119 Disease of salivary gland, unspecified: Secondary | ICD-10-CM

## 2020-08-03 DIAGNOSIS — J387 Other diseases of larynx: Secondary | ICD-10-CM | POA: Diagnosis not present

## 2020-08-03 DIAGNOSIS — M542 Cervicalgia: Secondary | ICD-10-CM

## 2020-08-03 DIAGNOSIS — R1310 Dysphagia, unspecified: Secondary | ICD-10-CM | POA: Insufficient documentation

## 2020-08-03 DIAGNOSIS — K118 Other diseases of salivary glands: Secondary | ICD-10-CM | POA: Diagnosis not present

## 2020-08-03 DIAGNOSIS — D329 Benign neoplasm of meninges, unspecified: Secondary | ICD-10-CM

## 2020-08-03 DIAGNOSIS — G9389 Other specified disorders of brain: Secondary | ICD-10-CM

## 2020-08-03 DIAGNOSIS — R221 Localized swelling, mass and lump, neck: Secondary | ICD-10-CM | POA: Diagnosis not present

## 2020-08-03 NOTE — Telephone Encounter (Signed)
Ct results are in epic

## 2020-08-03 NOTE — Progress Notes (Addendum)
Chief Complaint  Patient presents with  . Neck Pain   Acute visit Neck pain right sided with swallowing w/o pain, tender with swallowing, denies dysphagia duration x 3 weeks dental appt upcoming tender to touch and with swallowing  Review of Systems  Constitutional: Negative for weight loss.  HENT: Positive for sore throat. Negative for hearing loss.   Eyes: Negative for blurred vision.  Respiratory: Negative for shortness of breath.   Cardiovascular: Negative for chest pain.  Gastrointestinal: Negative for abdominal pain.  Musculoskeletal: Positive for neck pain.  Skin: Negative for rash.   Past Medical History:  Diagnosis Date  . Abnormal menses   . Allergy   . Arthritis   . Back pain   . Back pain   . Chicken pox   . Depression   . Diabetes mellitus without complication ([place])   . Hypertension   . Joint pain   . Joint pain   . Lymphedema   . Palpitations   . Peripheral neuropathy 09/30/2018  . Prediabetes   . Rosacea   . Seasonal allergies   . Sleep apnea   . Stasis dermatitis of both legs   . Vertigo    Past Surgical History:  Procedure Laterality Date  . WISDOM TOOTH EXTRACTION     Family History  Problem Relation Age of Onset  . Arthritis Mother   . Depression Mother   . Hypertension Mother   . Thyroid disease Mother   . Obesity Mother   . Depression Father   . Parkinson's disease Father   . Dementia Father   . Obesity Father   . Arthritis Maternal Grandmother   . Heart disease Maternal Grandmother   . Hypertension Maternal Grandmother   . Hyperlipidemia Maternal Grandmother   . Stroke Maternal Grandmother   . Alcohol abuse Maternal Grandfather   . Arthritis Maternal Grandfather   . Depression Maternal Grandfather   . Hearing loss Maternal Grandfather   . Heart disease Maternal Grandfather   . Stroke Maternal Grandfather   . Arthritis Paternal Grandmother   . Arthritis Paternal Grandfather   . Depression Paternal Grandfather   . Heart disease  Paternal Grandfather   . Hypertension Paternal Grandfather   . Stroke Paternal Grandfather   . Breast cancer Neg Hx    Social History   Socioeconomic History  . Marital status: Single    Spouse name: Not on file  . Number of children: Not on file  . Years of education: Not on file  . Highest education level: Not on file  Occupational History  . Occupation: work from home, Designer, jewellery - [place]  Tobacco Use  . Smoking status: Never Smoker  . Smokeless tobacco: Never Used  Substance and Sexual Activity  . Alcohol use: Not Currently    Alcohol/week: 0.0 standard drinks  . Drug use: Not Currently  . Sexual activity: Never  Other Topics Concern  . Not on file  Social History Narrative   Works for [employer] from home    No kids    No guns    Wears seat belt    Safe in relationship    Social Determinants of Radio broadcast assistant Strain: Not on file  Food Insecurity: Not on file  Transportation Needs: Not on file  Physical Activity: Not on file  Stress: Not on file  Social Connections: Not on file  Intimate Partner Violence: Not on file   Current Meds  Medication Sig  . acetaminophen (TYLENOL) 500  MG tablet Take 500 mg by mouth every 6 (six) hours as needed.  . Alpha-Lipoic Acid (LIPOIC ACID PO) Take 600 mg by mouth daily.  . B Complex Vitamins (VITAMIN B COMPLEX PO) Take by mouth.  [name] buPROPion (WELLBUTRIN SR) 150 MG 12 hr tablet TAKE 1 TABLET BY MOUTH TWICE DAILY  . Cholecalciferol (VITAMIN D3) 50 MCG (2000 UT) capsule Take 2,000 Units by mouth daily.  [name] FLUoxetine (PROZAC) 40 MG capsule Take 1 capsule (40 mg total) by mouth daily.  [name] gabapentin (NEURONTIN) 300 MG capsule TAKE 1 CAPSULE (300 MG TOTAL) BY MOUTH AT BEDTIME.  [name] glucosamine-chondroitin 500-400 MG tablet Take 1 tablet by mouth 3 (three) times daily.  . hydrochlorothiazide (HYDRODIURIL) 25 MG tablet TAKE 1 TABLET BY MOUTH DAILY IN MORNING  . lisinopril (ZESTRIL) 30 MG tablet TAKE 1 TABLET (30  MG TOTAL) BY MOUTH DAILY.  [name] loratadine (CLARITIN) 10 MG tablet Take 10 mg by mouth daily.  . medroxyPROGESTERone (PROVERA) 2.5 MG tablet TAKE 1 TABLET BY MOUTH DAILY  . metFORMIN (GLUCOPHAGE) 500 MG tablet TAKE 1 TABLET (500 MG TOTAL) BY MOUTH DAILY WITH BREAKFAST.  [name] metroNIDAZOLE (METROGEL) 0.75 % gel Apply 1 application topically 2 (two) times daily.  . Multiple Vitamin (MULTIVITAMIN) capsule Take 1 capsule by mouth daily.  . Omega-3 Fatty Acids (FISH OIL) 1000 MG CAPS Take 1,000 mg by mouth daily.  . Semaglutide,0.25 or 0.5MG/DOS, 2 MG/1.5ML SOPN INJECT 0.5 MG INTO THE SKIN ONCE A WEEK.   Allergies  Allergen Reactions  . Bee Pollen Other (See Comments)  . Dust Mite Extract   . Latex   . Pollen Extract    Recent Results (from the past 2160 hour(s))  Urinalysis, Routine w reflex microscopic     Status: Abnormal   Collection Time: 06/16/20  8:42 AM  Result Value Ref Range   Specific Gravity, UA 1.026 1.005 - 1.030   pH, UA 5.5 5.0 - 7.5   Color, UA Yellow Yellow   Appearance Ur Turbid (A) Clear   Leukocytes,UA 1+ (A) Negative   Protein,UA Trace Negative/Trace   Glucose, UA Negative Negative   Ketones, UA Trace (A) Negative   RBC, UA Negative Negative   Bilirubin, UA Negative Negative   Urobilinogen, Ur 0.2 0.2 - 1.0 mg/dL   Nitrite, UA Negative Negative   Microscopic Examination See below:     Comment: Microscopic was indicated and was performed.  Urine Culture     Status: None   Collection Time: 06/16/20  8:42 AM   Specimen: Urine   Urine  Result Value Ref Range   Urine Culture, Routine Final report    Organism ID, Bacteria Lactobacillus species     Comment: 50,000-100,000 colony forming units per mL Susceptibility not normally performed on this organism.   Microscopic Examination     Status: Abnormal   Collection Time: 06/16/20  8:42 AM   Urine  Result Value Ref Range   WBC, UA 11-30 (A) 0 - 5 /hpf   RBC 0-2 0 - 2 /hpf   Epithelial Cells (non renal) 0-10 0 - 10  /hpf   Casts None seen None seen /lpf   Crystals Present (A) N/A   Crystal Type Calcium Oxalate N/A   Bacteria, UA Moderate (A) None seen/Few   Objective  Body mass index is 86.05 kg/m. Wt Readings from Last 3 Encounters:  08/03/20 (!) 440 lb 9.6 oz (199.9 kg)  07/20/20 (!) 446 lb (202.3 kg)  07/01/20 (!) 441 lb (200  kg)   Temp Readings from Last 3 Encounters:  08/03/20 98 F (36.7 C) (Oral)  07/20/20 98.3 F (36.8 C)  07/01/20 98.1 F (36.7 C)   BP Readings from Last 3 Encounters:  08/03/20 132/84  07/20/20 139/77  07/01/20 128/78   Pulse Readings from Last 3 Encounters:  08/03/20 (!) 101  07/20/20 90  07/01/20 72    Physical Exam Vitals and nursing note reviewed.  Constitutional:      Appearance: Normal appearance. She is well-developed and well-groomed.  HENT:     Head: Normocephalic and atraumatic.  Eyes:     Conjunctiva/sclera: Conjunctivae normal.     Pupils: Pupils are equal, round, and reactive to light.  Cardiovascular:     Rate and Rhythm: Normal rate and regular rhythm.     Heart sounds: Normal heart sounds. No murmur heard.   Skin:    General: Skin is warm and dry.  Neurological:     General: No focal deficit present.     Mental Status: She is alert and oriented to person, place, and time. Mental status is at baseline.     Gait: Gait normal.     Comments: BL walks with cane  Psychiatric:        Attention and Perception: Attention and perception normal.        Mood and Affect: Mood and affect normal.        Speech: Speech normal.        Behavior: Behavior normal. Behavior is cooperative.        Thought Content: Thought content normal.        Cognition and Memory: Cognition and memory normal.        Judgment: Judgment normal.   08/18/20 MRI brain   FINDINGS: Brain: There is an extra-axial, parasagittal left parieto-occipital enhancing mass measuring approximately 4.9 x 5 x 5.9 cm. This makes broad contact with the cerebral convexity and  posterior falx. There is mass effect on the underlying parenchyma as well as the adjacent superior sagittal sinus. There is no underlying parenchymal edema.  There is no acute infarction or intracranial hemorrhage. No parenchymal mass. There is patchy T2 hyperintensity in the central pons likely reflecting chronic microvascular ischemic changes. There is no hydrocephalus or extra-axial fluid collection.  Vascular: Compression of the superior sagittal sinus by the above mass without evidence of thrombosis. There may be invasion (series 14, image 98). Major vessel flow voids at the skull base are preserved.  Skull and upper cervical spine: Normal marrow signal is preserved.  Sinuses/Orbits: Paranasal sinuses are aerated. Orbits are unremarkable.  Other: Sella is partially empty. Mild patchy right mastoid fluid opacification.  IMPRESSION: 5.9 cm left parieto-occipital meningioma along the cerebral convexity and posterior falx. Mass effect on the adjacent superior sagittal sinus with possible focal invasion. No parenchymal edema.   Electronically Signed   By: Macy Mis M.D.   On: 08/18/2020 15:26  Assessment  Plan  Neck pain - Plan: CT SOFT TISSUE NECK WO CONTRAST Swallowing painful - Plan: CT SOFT TISSUE NECK WO CONTRAST  Fu dental   Ct neck 08/03/20  IMPRESSION: No appreciable swelling or discrete mass within the oral cavity, pharynx or larynx on this non-contrast examination. However, if symptoms persist, consider ENT consultation and post-contrast CT imaging of the neck for further evaluation.  7 mm ovoid nodule within the right parotid gland. This may reflect a nonspecific mildly prominent lymph node. A small primary parotid neoplasm is difficult to exclude.  There  is suspicion for a partially imaged intracranial mass within the left occipital region (at the very periphery of the field of view). MRI of the brain without and with contrast is  recommended for further evaluation.  7 mm nodule right parotid gland vs mildly enlarged lymph node  ? Mass in left back of brain we need to do an MRI of brain with and w/o dye open is recommended  -->I think she will have to go to [place] with this to further work up  ENT consult is recommended to consider CT scan repeat neck with and w/o dye and consult for parotid gland mass -does she want to go to [place] or [employer] [facility] pt sees Dr. Richardson Landry will send message for him to sch pt    Meningioma  Referred Dr. Tennis Must [facility]   HM Flu shot utd UTD Tdap  rec MMR and hep B vaccine pt to think about covid vx had 3/3 consider 4th dose  Consider shingrix x 2 doses in the future   Declines STD testing  Pap 02/02/16 neg neg HPVf/u [facility] Dr. Guy Franco to encompass [facility] encompass to see if needs to w/u DUB Pap had 01/14/20 neg neg pap  6/28/19mammogramnegreferredpreviously overdue Reschedule  Derm is Dr. Juanetta Gosling 2020  Colonoscopy consider in future age 53 y.o 12/2020vs cologuard disc at f/u -message me back when ready vs cologuard disc 12/16/19 and 3/303/22   Rec healthy diet and exercise Provider: Dr. Olivia Mackie McLean-Scocuzza-Internal Medicine

## 2020-08-03 NOTE — Patient Instructions (Addendum)
Call [facility] and schedule mammogram  Let me about colonoscopy: [facility] in [address], Havery Moros, Burna Mortimer, [facility] Dr. Joselyn Arrow  [facility] Dr. Rob Bunting or his partners in [place]       Meningioma  A meningioma is a growth (tumor) that occurs in the meninges, which is the thin tissue that covers the brain and spinal cord. Meningiomas are usually benign. Benign means they are not cancerous and do not spread to other areas. In rare cases, a meningioma may become cancerous (malignant). What are the causes? This condition may be caused by:  Being exposed to ionizing radiation. This type of energy occurs naturally, and it has artificial sources, such as X-rays and some medical devices.  Neurofibromatosis 2. This is a genetic disorder that causes multiple soft tumors.  Genetic mutation. This is a change in certain genes. In many cases, the cause of this condition is not known. What increases the risk? The following factors may make you more likely to develop this condition:  Having been exposed to radiation, especially as a child.  Being a woman. There may be a greater risk associated with having female hormones. Older women have a higher risk of meningiomas than men or children. However, men have a higher risk of malignant meningiomas.  Obesity. What are the signs or symptoms? Common symptoms of this condition include:  Headaches.  Nausea and vomiting.  Vision changes.  Changes to hearing.  Loss of your sense of smell. Other symptoms include:  Seizures.  Weakness or numbness on one side of your body, or in an arm or a leg.  Problems with memory or thinking.  Mood or personality changes. Symptoms of this condition usually begin very slowly. The symptoms may depend on the size and location of your tumor. How is this diagnosed? This condition is diagnosed based on:  Results of brain imaging tests, such as a CT scan or an MRI.  Removal of a  tissue sample of the tumor to look at under a microscope (biopsy). This may be done to confirm the diagnosis and to help determine the best treatment for your condition. How is this treated? This condition may be treated with:  Steroids. These are medicines for lowering brain swelling and improving symptoms.  Radiation therapy. This therapy uses high-energy rays to shrink or kill your tumor.  Surgery to remove as much of your tumor as possible. You may not have treatment until your symptoms start to affect your daily activities. This is because meningiomas grow very slowly. Your health care provider may prefer to watch for growth of your tumor before starting treatment. Follow these instructions at home:  Take over-the-counter and prescription medicines only as told by your health care provider.  Follow instructions from your health care provider about eating or drinking restrictions.  Drink enough fluid to keep your urine pale yellow.  Return to your normal activities as told by your health care provider. Ask your health care provider what activities are safe for you.  Keep all follow-up visits as told by your health care provider. This is important. You may need regular visits to watch the growth of your tumor. Contact a health care provider if:  You have symptoms that come back.  You have diarrhea.  You vomit.  You have pain in your abdomen (abdominal pain).  You cannot eat or drink as much as you need.  You are weaker or more tired than usual.  You are losing weight without trying.  Get help right away if:  Your diarrhea, vomiting, or abdominal pain does not go away, or you cannot eat or drink without vomiting.  You have sudden changes in vision.  You have trouble walking.  You have a seizure.  You have bleeding that does not stop.  You have trouble breathing.  You have a fever.  You have new weakness or numbness on one side of your body. Summary  A  meningioma is a growth (tumor) that occurs in the meninges, which is the thin tissue that covers the brain and spinal cord.  Meningiomas are usually benign. Benign means they are not cancerous and do not spread to other areas.  Symptoms of this condition usually begin very slowly. The symptoms may depend on the size and location of your tumor.  You may not need treatment until your symptoms start to affect your daily activities. Your tumor may be watched over time. This information is not intended to replace advice given to you by your health care provider. Make sure you discuss any questions you have with your health care provider. Document Revised: 03/10/2019 Document Reviewed: 03/10/2019 Elsevier Patient Education  2021 [place].   Dysphagia is trouble swallowing. This condition occurs when solids and liquids stick in a person's throat on the way down to the stomach, or when food takes longer to get to the stomach than usual. You may have problems swallowing food, liquids, or both. You may also have pain while trying to swallow. It may take you more time and effort to swallow something. What are the causes? This condition may be caused by:  Muscle problems. These may make it difficult for you to move food and liquids through the esophagus, which is the tube that connects your mouth to your stomach.  Blockages. You may have ulcers, scar tissue, or inflammation that blocks the normal passage of food and liquids. Causes of these problems include: ? Acid reflux from your stomach into your esophagus (gastroesophageal reflux). ? Infections. ? Radiation treatment for cancer. ? Medicines taken without enough fluids to wash them down into your stomach.  Stroke. This can affect the nerves and make it difficult to swallow.  Nerve problems. These prevent signals from being sent to the muscles of your esophagus to squeeze (contract) and move what you swallow down to your stomach.  Globus  pharyngeus. This is a common problem that involves a feeling like something is stuck in your throat or a sense of trouble with swallowing, even though nothing is wrong with the swallowing passages.  Certain conditions, such as cerebral palsy or Parkinson's disease. What are the signs or symptoms? Common symptoms of this condition include:  A feeling that solids or liquids are stuck in your throat on the way down to the stomach.  Pain while swallowing.  Coughing or gagging while trying to swallow. Other symptoms include:  Food moving back from your stomach to your mouth (regurgitation).  Noises coming from your throat.  Chest discomfort when swallowing.  A feeling of fullness when swallowing.  Drooling, especially when the throat is blocked.  Heartburn. How is this diagnosed? This condition may be diagnosed by:  Barium swallow X-ray. In this test, you will swallow a white liquid that sticks to the inside of your esophagus. X-ray images are then taken.  Endoscopy. In this test, a flexible telescope is inserted down your throat to look at your esophagus and your stomach.  CT scans or an MRI. How is this treated? Treatment  for dysphagia depends on the cause of this condition:  If the dysphagia is caused by acid reflux or infection, medicines may be used. These may include antibiotics or heartburn medicines.  If the dysphagia is caused by problems with the muscles, swallowing therapy may be used to help you strengthen your swallowing muscles. You may have to do specific exercises to strengthen the muscles or stretch them.  If the dysphagia is caused by a blockage or mass, procedures to remove the blockage may be done. You may need surgery and a feeding tube. You may need to make diet changes. Ask your health care provider for specific instructions. Follow these instructions at home: Medicines  Take over-the-counter and prescription medicines only as told by your health care  provider.  If you were prescribed an antibiotic medicine, take it as told by your health care provider. Do not stop taking the antibiotic even if you start to feel better. Eating and drinking  Make any diet changes as told by your health care provider.  Work with a diet and nutrition specialist (dietitian) to create an eating plan that will help you get the nutrients you need in order to stay healthy.  Eat soft foods that are easier to swallow.  Cut your food into small pieces and eat slowly. Take small bites.  Eat and drink only when you are sitting upright.  Do not drink alcohol or caffeine. If you need help quitting, ask your health care provider.   General instructions  Check your weight every day to make sure you are not losing weight.  Do not use any products that contain nicotine or tobacco. These products include cigarettes, chewing tobacco, and vaping devices, such as e-cigarettes. If you need help quitting, ask your health care provider.  Keep all follow-up visits. This is important. Contact a health care provider if:  You lose weight because you cannot swallow.  You cough when you drink liquids.  You cough up partially digested food. Get help right away if:  You cannot swallow your saliva.  You have shortness of breath, a fever, or both.  Your voice is hoarse and you have trouble swallowing. These symptoms may represent a serious problem that is an emergency. Do not wait to see if the symptoms will go away. Get medical help right away. Call your local emergency services (911 in the [place] Do not drive yourself to the hospital. Summary  Dysphagia is trouble swallowing. This condition occurs when solids and liquids stick in a person's throat on the way down to the stomach. You may cough or gag while trying to swallow.  Dysphagia has many possible causes.  Treatment for dysphagia depends on the cause of the condition.  Keep all follow-up visits. This is  important. This information is not intended to replace advice given to you by your health care provider. Make sure you discuss any questions you have with your health care provider. Document Revised: 10/25/2019 Document Reviewed: 10/25/2019 Elsevier Patient Education  2021 [employer].

## 2020-08-03 NOTE — Telephone Encounter (Signed)
Patient called in wanted a callback to discuss Ct results

## 2020-08-03 NOTE — Telephone Encounter (Signed)
  [facility] called STAT CT Impression below.   IMPRESSION: No appreciable swelling or discrete mass within the oral cavity, pharynx or larynx on this non-contrast examination. However, if symptoms persist, consider ENT consultation and post-contrast CT imaging of the neck for further evaluation.  7 mm ovoid nodule within the right parotid gland. This may reflect a nonspecific mildly prominent lymph node. A small primary parotid neoplasm is difficult to exclude.  There is suspicion for a partially imaged intracranial mass within the left occipital region (at the very periphery of the field of view). MRI of the brain without and with contrast is recommended for further evaluation.

## 2020-08-04 ENCOUNTER — Encounter (INDEPENDENT_AMBULATORY_CARE_PROVIDER_SITE_OTHER): Payer: Self-pay

## 2020-08-04 ENCOUNTER — Ambulatory Visit (INDEPENDENT_AMBULATORY_CARE_PROVIDER_SITE_OTHER): Payer: 59 | Admitting: Bariatrics

## 2020-08-04 ENCOUNTER — Encounter: Payer: Self-pay | Admitting: Internal Medicine

## 2020-08-04 NOTE — Telephone Encounter (Signed)
Pt called to get CT results  

## 2020-08-04 NOTE — Telephone Encounter (Signed)
Patient calling in again, Please advise

## 2020-08-05 NOTE — Telephone Encounter (Signed)
Patient inquiring about MRI order. Patient aware order need to be placed, approved, and then scheduled.

## 2020-08-05 NOTE — Telephone Encounter (Signed)
I spoke to the patient today when she called to see if her MRI was scheduled. I informed her that the order will be put into the system and if a prior authorization is needed we would call her insurance company and we will call her with her scheduled date. I also informed the patient due to her not being symptomatic it may not be put in as urgent. When I informed her of the symptoms like vomiting , headache etc. She became relieved because she was not having any of those symptoms. She said ," Thank you, I'm relieved that those things are not going on." The patient is willing to wait until the referral coordinator returns.

## 2020-08-09 ENCOUNTER — Other Ambulatory Visit: Payer: Self-pay | Admitting: Internal Medicine

## 2020-08-09 ENCOUNTER — Encounter: Payer: Self-pay | Admitting: Internal Medicine

## 2020-08-09 DIAGNOSIS — G9389 Other specified disorders of brain: Secondary | ICD-10-CM

## 2020-08-09 DIAGNOSIS — K119 Disease of salivary gland, unspecified: Secondary | ICD-10-CM | POA: Insufficient documentation

## 2020-08-09 DIAGNOSIS — Z1231 Encounter for screening mammogram for malignant neoplasm of breast: Secondary | ICD-10-CM

## 2020-08-09 HISTORY — DX: Other specified disorders of brain: G93.89

## 2020-08-09 NOTE — Telephone Encounter (Signed)
Please sch open Mri in [place]  Thank you Needs lab visit 1st here for creatinine/kidney function

## 2020-08-09 NOTE — Addendum Note (Signed)
Addended by: Orland Mustard on: 08/09/2020 10:38 AM   Modules accepted: Orders

## 2020-08-10 NOTE — Telephone Encounter (Signed)
Patient scheduled for lab 11:30 Friday.

## 2020-08-11 ENCOUNTER — Other Ambulatory Visit: Payer: Self-pay

## 2020-08-11 DIAGNOSIS — D37030 Neoplasm of uncertain behavior of the parotid salivary glands: Secondary | ICD-10-CM | POA: Diagnosis not present

## 2020-08-11 DIAGNOSIS — I8310 Varicose veins of unspecified lower extremity with inflammation: Secondary | ICD-10-CM | POA: Diagnosis not present

## 2020-08-11 DIAGNOSIS — L718 Other rosacea: Secondary | ICD-10-CM | POA: Diagnosis not present

## 2020-08-11 MED ORDER — METRONIDAZOLE 1 % EX GEL
Freq: Every day | CUTANEOUS | 5 refills | Status: DC
  Filled 2020-08-11: qty 60, 30d supply, fill #0
  Filled 2020-09-02: qty 60, 20d supply, fill #0

## 2020-08-12 ENCOUNTER — Other Ambulatory Visit (INDEPENDENT_AMBULATORY_CARE_PROVIDER_SITE_OTHER): Payer: Self-pay | Admitting: Bariatrics

## 2020-08-12 ENCOUNTER — Other Ambulatory Visit: Payer: Self-pay

## 2020-08-12 ENCOUNTER — Encounter (INDEPENDENT_AMBULATORY_CARE_PROVIDER_SITE_OTHER): Payer: Self-pay | Admitting: Bariatrics

## 2020-08-12 DIAGNOSIS — F3289 Other specified depressive episodes: Secondary | ICD-10-CM

## 2020-08-12 DIAGNOSIS — R7303 Prediabetes: Secondary | ICD-10-CM

## 2020-08-12 MED ORDER — SEMAGLUTIDE(0.25 OR 0.5MG/DOS) 2 MG/1.5ML ~~LOC~~ SOPN
0.5000 mg | PEN_INJECTOR | SUBCUTANEOUS | 0 refills | Status: AC
  Filled 2020-08-12: qty 1.5, 28d supply, fill #0

## 2020-08-12 MED ORDER — BUPROPION HCL ER (SR) 150 MG PO TB12
ORAL_TABLET | Freq: Two times a day (BID) | ORAL | 0 refills | Status: DC
  Filled 2020-08-12: qty 60, 30d supply, fill #0

## 2020-08-12 NOTE — Telephone Encounter (Signed)
Please review

## 2020-08-12 NOTE — Telephone Encounter (Signed)
FYI

## 2020-08-12 NOTE — Telephone Encounter (Signed)
Dr.Brown 

## 2020-08-12 NOTE — Telephone Encounter (Signed)
Pt last seen by Dr. Brown.  

## 2020-08-13 ENCOUNTER — Other Ambulatory Visit (INDEPENDENT_AMBULATORY_CARE_PROVIDER_SITE_OTHER): Payer: 59

## 2020-08-13 ENCOUNTER — Other Ambulatory Visit: Payer: Self-pay

## 2020-08-13 DIAGNOSIS — M542 Cervicalgia: Secondary | ICD-10-CM | POA: Diagnosis not present

## 2020-08-13 DIAGNOSIS — G9389 Other specified disorders of brain: Secondary | ICD-10-CM | POA: Diagnosis not present

## 2020-08-13 DIAGNOSIS — K119 Disease of salivary gland, unspecified: Secondary | ICD-10-CM

## 2020-08-13 DIAGNOSIS — R1310 Dysphagia, unspecified: Secondary | ICD-10-CM | POA: Diagnosis not present

## 2020-08-13 LAB — CREATININE, SERUM: Creatinine, Ser: 1.08 mg/dL (ref 0.40–1.20)

## 2020-08-18 ENCOUNTER — Encounter: Payer: Self-pay | Admitting: Internal Medicine

## 2020-08-18 ENCOUNTER — Ambulatory Visit
Admission: RE | Admit: 2020-08-18 | Discharge: 2020-08-18 | Disposition: A | Discharge status: Home / Self Care | Payer: 59 | Source: Ambulatory Visit | Attending: Internal Medicine | Admitting: Internal Medicine

## 2020-08-18 ENCOUNTER — Other Ambulatory Visit: Payer: Self-pay

## 2020-08-18 DIAGNOSIS — R220 Localized swelling, mass and lump, head: Secondary | ICD-10-CM | POA: Diagnosis not present

## 2020-08-18 DIAGNOSIS — G9389 Other specified disorders of brain: Secondary | ICD-10-CM | POA: Diagnosis not present

## 2020-08-18 DIAGNOSIS — R1310 Dysphagia, unspecified: Secondary | ICD-10-CM

## 2020-08-18 DIAGNOSIS — M542 Cervicalgia: Secondary | ICD-10-CM

## 2020-08-18 DIAGNOSIS — K119 Disease of salivary gland, unspecified: Secondary | ICD-10-CM

## 2020-08-18 DIAGNOSIS — D329 Benign neoplasm of meninges, unspecified: Secondary | ICD-10-CM | POA: Diagnosis not present

## 2020-08-18 IMAGING — MR MR HEAD WO/W CM
13 series · 48 of 48 positions shown · IV contrast (multihance)
Comparison: None.

CLINICAL DATA: Brain mass on CT

EXAM:
MRI HEAD WITHOUT AND WITH CONTRAST
TECHNIQUE: Multiplanar, multiecho pulse sequences of the brain and surrounding
structures were obtained without and with intravenous contrast.
CONTRAST:  20mL MULTIHANCE GADOBENATE DIMEGLUMINE 529 MG/ML IV SOLN

[Series 2: T1 · sagittal · 5.0mm · 0.45mm/px · 2 of 25 slices shown]
[im 1/25]
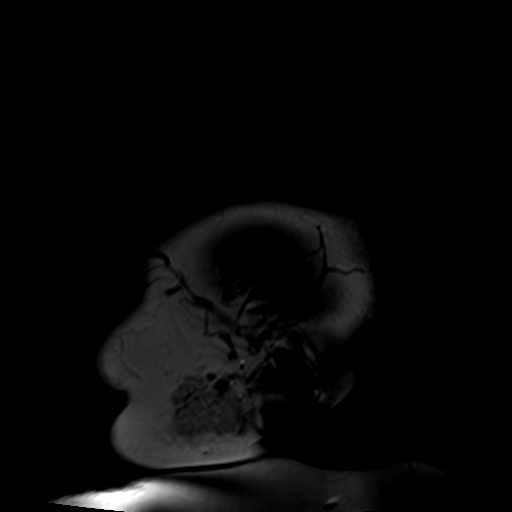
[im 25/25]
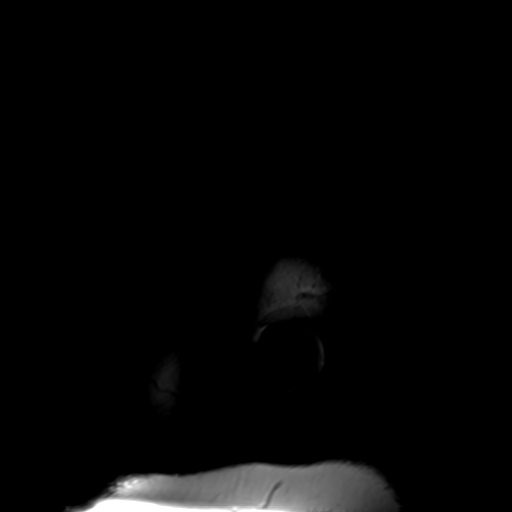

[Series 3: ax ep2d_diff_3 · axial · 3.0mm · 1.80mm/px · z∈[-132,+28]mm · 6 of 107 slices shown]
[im 1/107]
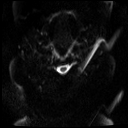
[im 22/107]
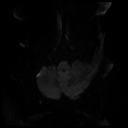
[im 43/107]
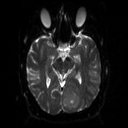
[im 64/107]
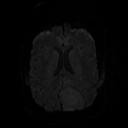
[im 85/107]
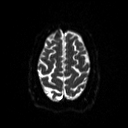
[im 107/107]
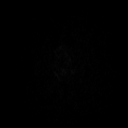

[Series 4: ax ep2d_diff_3_adc · axial · 3.0mm · 1.80mm/px · z∈[-132,+28]mm · 3 of 54 slices shown]
[im 1/54]
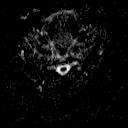
[im 27/54]
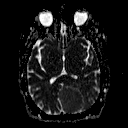
[im 54/54]
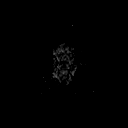

[Series 5: cor ep2d_diff · coronal · 5.0mm · 1.77mm/px · 3 of 59 slices shown]
[im 1/59]
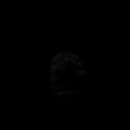
[im 30/59]
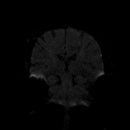
[im 59/59]
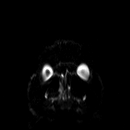

[Series 6: cor ep2d_diff_adc · coronal · 5.0mm · 1.77mm/px · 2 of 30 slices shown]
[im 1/30]
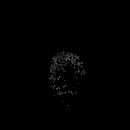
[im 30/30]
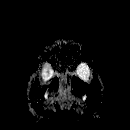

[Series 8: swi_images · axial · 2.0mm · 0.98mm/px · z∈[-128,+29]mm · 5 of 80 slices shown]
[im 1/80]
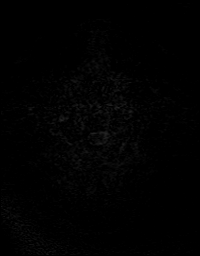
[im 20/80]
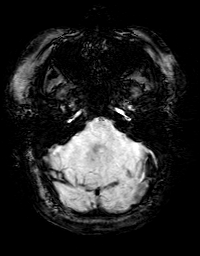
[im 40/80]
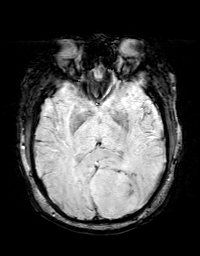
[im 60/80]
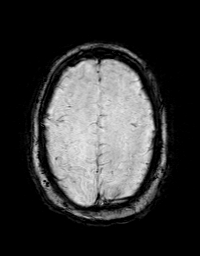
[im 80/80]
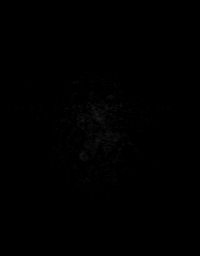

[Series 9: FLAIR · axial · 3.0mm · 0.47mm/px · z∈[-127,+24]mm · 2 of 40 slices shown]
[im 1/40]
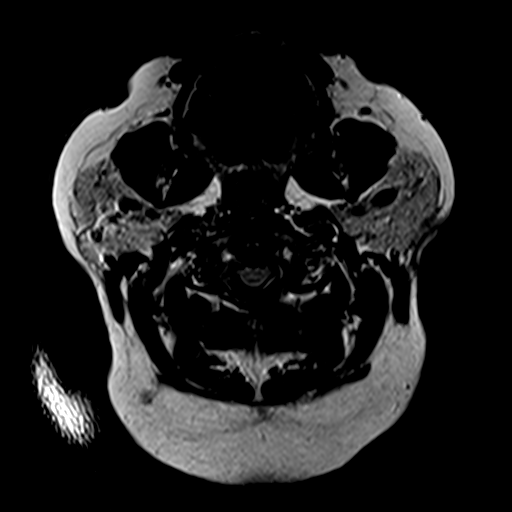
[im 40/40]
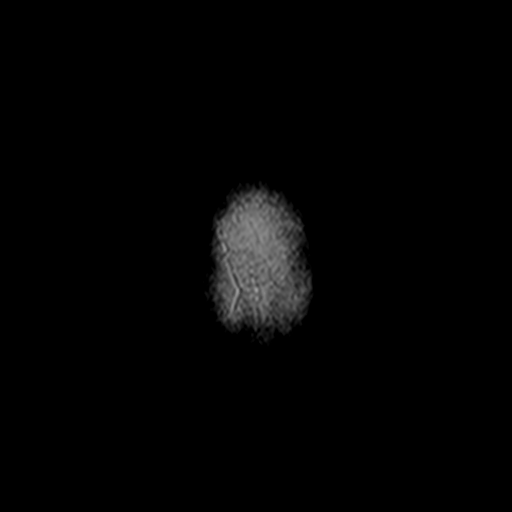

[Series 10: T2 · axial · 5.0mm · 0.65mm/px · z∈[-133,+34]mm · 2 of 29 slices shown (1 of 2)]
[im 1/29]
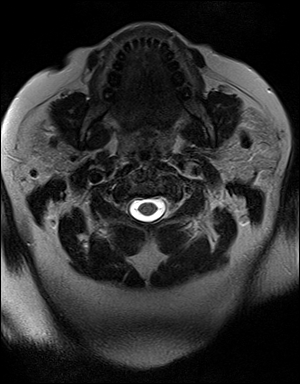
[im 29/29]
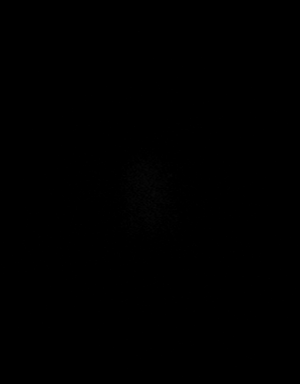

[Series 11: t1_mpr_tra · axial · 1.0mm · 0.72mm/px · z∈[-129,+29]mm · 9 of 160 slices shown]
[im 1/160]
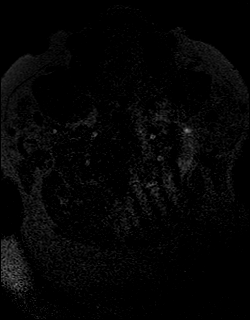
[im 20/160]
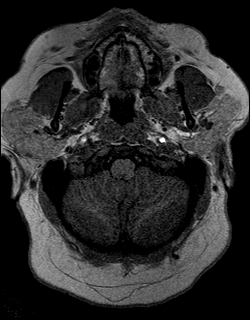
[im 40/160]
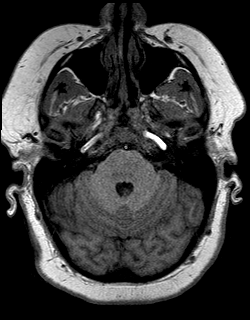
[im 60/160]
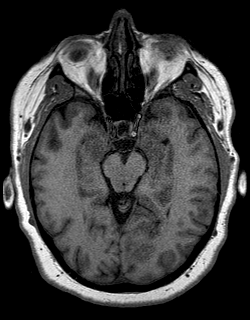
[im 80/160]
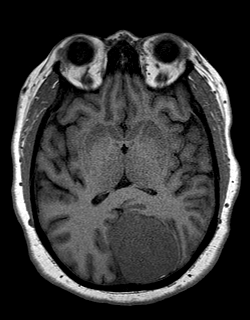
[im 100/160]
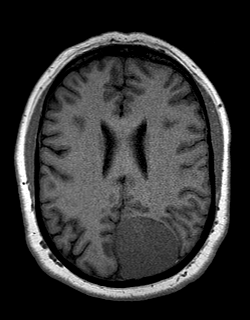
[im 120/160]
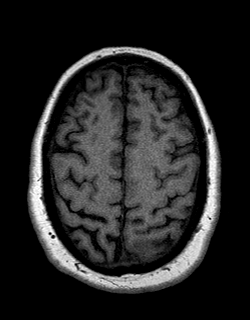
[im 140/160]
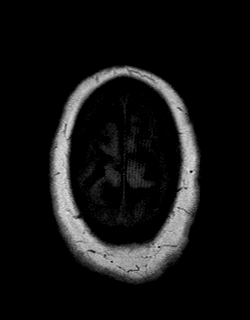
[im 160/160]
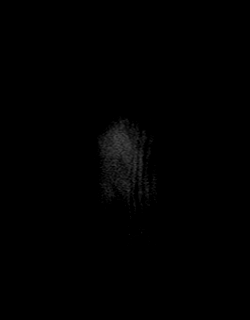

[Series 12: T2 · coronal · 5.0mm · 0.45mm/px · 2 of 31 slices shown (2 of 2)]
[im 1/31]
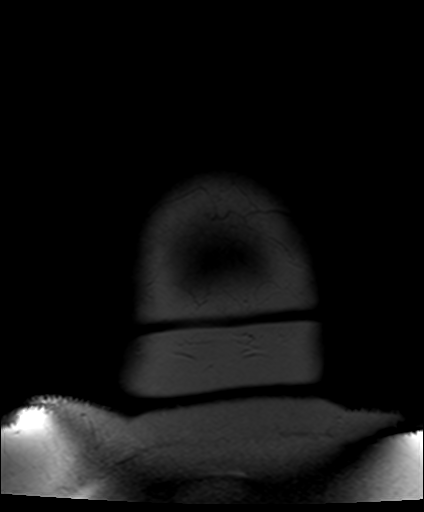
[im 31/31]
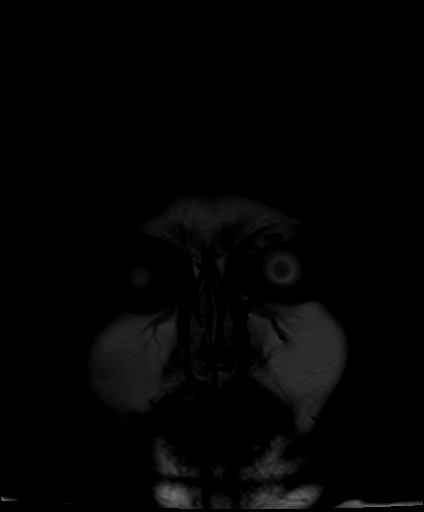

[Series 13: T1 post-contrast · coronal · 5.0mm · 0.72mm/px · 2 of 30 slices shown]
[im 1/30]
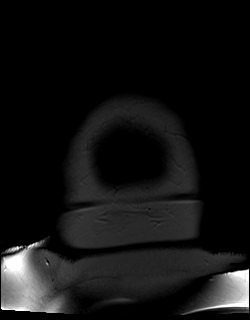
[im 30/30]
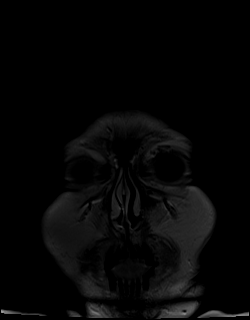

[Series 14: post t1_mpr_tra · axial · 1.0mm · 0.75mm/px · z∈[-129,+29]mm · 9 of 160 slices shown]
[im 1/160]
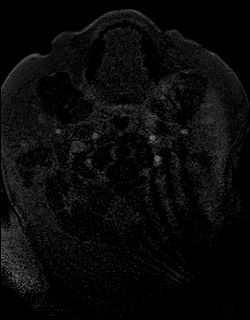
[im 20/160]
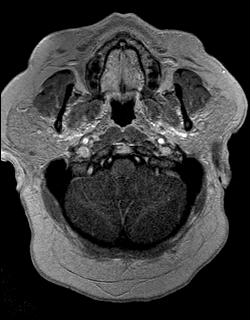
[im 40/160]
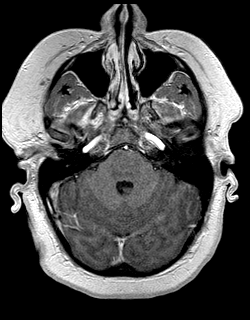
[im 60/160]
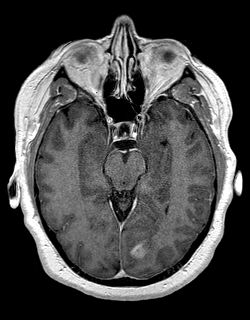
[im 80/160]
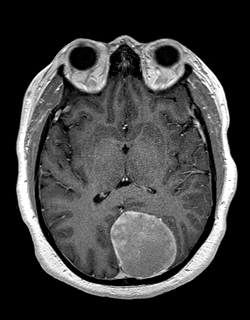
[im 100/160]
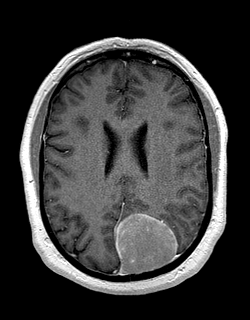
[im 120/160]
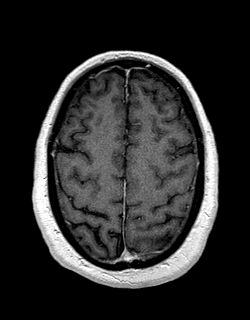
[im 140/160]
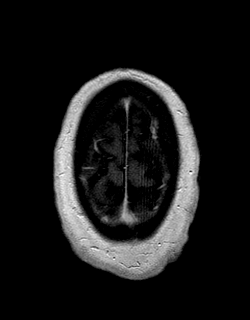
[im 160/160]
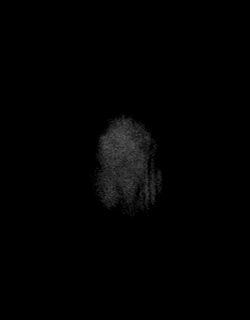

[Series 15: t1_se_sag post · sagittal · 5.0mm · 0.45mm/px · 1 of 25 slices shown]
[im 1/25]
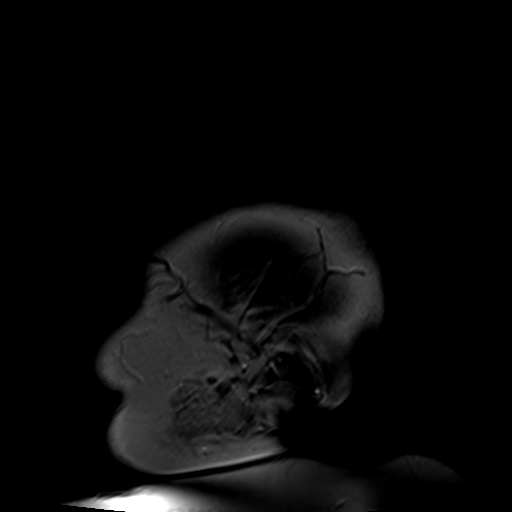

[48 of 48 positions shown; findings below may reference images not displayed]

FINDINGS: Brain: There is an extra-axial, parasagittal left parieto-occipital
enhancing mass measuring approximately 4.9 x 5 x 5.9 cm. This makes
broad contact with the cerebral convexity and posterior falx. There
is mass effect on the underlying parenchyma as well as the adjacent
superior sagittal sinus. There is no underlying parenchymal edema.

There is no acute infarction or intracranial hemorrhage. No
parenchymal mass. There is patchy T2 hyperintensity in the central
pons likely reflecting chronic microvascular ischemic changes. There
is no hydrocephalus or extra-axial fluid collection.

Vascular: Compression of the superior sagittal sinus by the above
mass without evidence of thrombosis. There may be invasion (series
14, image 98). Major vessel flow voids at the skull base are
preserved.

Skull and upper cervical spine: Normal marrow signal is preserved.

Sinuses/Orbits: Paranasal sinuses are aerated. Orbits are
unremarkable.

Other: Sella is partially empty. Mild patchy right mastoid fluid
opacification.
IMPRESSION: 5.9 cm left parieto-occipital meningioma along the cerebral
convexity and posterior falx. Mass effect on the adjacent superior
sagittal sinus with possible focal invasion. No parenchymal edema.

## 2020-08-18 MED ORDER — GADOBENATE DIMEGLUMINE 529 MG/ML IV SOLN
20.0000 mL | Freq: Once | INTRAVENOUS | Status: AC | PRN
  Administered 2020-08-18: 20 mL via INTRAVENOUS

## 2020-08-18 NOTE — Addendum Note (Signed)
Addended by: Orland Mustard on: 08/18/2020 05:25 PM   Modules accepted: Orders

## 2020-08-19 ENCOUNTER — Telehealth: Payer: Self-pay | Admitting: Internal Medicine

## 2020-08-19 NOTE — Telephone Encounter (Signed)
Patient called in wanted to make sure Dr.Mclean saw her mychart message about the referral that she can not go to [facility] she has to go to a [facility] in the [place] [facility] system

## 2020-08-20 ENCOUNTER — Telehealth: Payer: Self-pay

## 2020-08-20 NOTE — Telephone Encounter (Signed)
I saw the message will change to [facility]

## 2020-08-20 NOTE — Addendum Note (Signed)
Addended by: Orland Mustard on: 08/20/2020 05:47 PM   Modules accepted: Orders

## 2020-08-20 NOTE — Telephone Encounter (Signed)
Please advise 

## 2020-08-20 NOTE — Telephone Encounter (Signed)
-----   Message from Belinda Jackson, MD sent at 08/18/2020  3:42 PM EDT ----- 4.9 x 5 x 5.9 cm meningioma typically a benign mass but you need to see neurosurgery I rec at [place] Dr. Johnney Killian  Can you go to [facility] with [facility] insurance ?  It is pressing on sinus in the brain and possible invasion

## 2020-08-20 NOTE — Telephone Encounter (Signed)
See 08/18/20 Patient message encounter

## 2020-08-23 ENCOUNTER — Other Ambulatory Visit: Payer: Self-pay

## 2020-08-24 ENCOUNTER — Encounter (INDEPENDENT_AMBULATORY_CARE_PROVIDER_SITE_OTHER): Payer: Self-pay | Admitting: Bariatrics

## 2020-08-29 DIAGNOSIS — G4733 Obstructive sleep apnea (adult) (pediatric): Secondary | ICD-10-CM | POA: Diagnosis not present

## 2020-08-30 ENCOUNTER — Ambulatory Visit (INDEPENDENT_AMBULATORY_CARE_PROVIDER_SITE_OTHER): Payer: 59 | Admitting: Bariatrics

## 2020-09-01 DIAGNOSIS — D496 Neoplasm of unspecified behavior of brain: Secondary | ICD-10-CM | POA: Diagnosis not present

## 2020-09-02 ENCOUNTER — Other Ambulatory Visit: Payer: Self-pay

## 2020-09-02 ENCOUNTER — Other Ambulatory Visit: Payer: Self-pay | Admitting: Neurological Surgery

## 2020-09-02 MED FILL — Gabapentin Cap 300 MG: ORAL | 90 days supply | Qty: 90 | Fill #1 | Status: AC

## 2020-09-02 MED FILL — Lisinopril Tab 30 MG: ORAL | 90 days supply | Qty: 90 | Fill #0 | Status: AC

## 2020-09-02 MED FILL — Medroxyprogesterone Acetate Tab 2.5 MG: ORAL | 90 days supply | Qty: 90 | Fill #1 | Status: AC

## 2020-09-06 NOTE — Telephone Encounter (Signed)
Please review

## 2020-09-07 ENCOUNTER — Other Ambulatory Visit (HOSPITAL_BASED_OUTPATIENT_CLINIC_OR_DEPARTMENT_OTHER): Payer: Self-pay

## 2020-09-07 ENCOUNTER — Encounter: Payer: Self-pay | Admitting: Internal Medicine

## 2020-09-08 ENCOUNTER — Other Ambulatory Visit: Payer: Self-pay

## 2020-09-09 ENCOUNTER — Other Ambulatory Visit: Payer: Self-pay

## 2020-09-13 ENCOUNTER — Other Ambulatory Visit (HOSPITAL_BASED_OUTPATIENT_CLINIC_OR_DEPARTMENT_OTHER): Payer: Self-pay

## 2020-09-13 DIAGNOSIS — Z6841 Body Mass Index (BMI) 40.0 and over, adult: Secondary | ICD-10-CM | POA: Diagnosis not present

## 2020-09-13 DIAGNOSIS — D329 Benign neoplasm of meninges, unspecified: Secondary | ICD-10-CM | POA: Diagnosis not present

## 2020-09-13 DIAGNOSIS — G608 Other hereditary and idiopathic neuropathies: Secondary | ICD-10-CM | POA: Diagnosis not present

## 2020-09-14 NOTE — Progress Notes (Signed)
Surgical Instructions    Your procedure is scheduled on 09-28-20 at 0730.  Report to [facility] Main Entrance "A" at 0530 A.M., then check in with the Admitting office.  Call this number if you have problems the morning of surgery:  [phone]   If you have any questions prior to your surgery date call [phone]: Open Monday-Friday 8am-4pm    Remember:  Do not eat after midnight the night before your surgery  You may drink clear liquids until 0430 the morning of your surgery.   Clear liquids allowed are: Water, Non-Citrus Juices (without pulp), Carbonated Beverages, Clear Tea, Black Coffee Only, and Gatorade    Take these medicines the morning of surgery with A SIP OF WATER  acetaminophen (TYLENOL)    medroxyPROGESTERone (PROVERA) FLUoxetine (PROZAC)             loratadine (CLARITIN)    As of today, STOP taking any Aspirin (unless otherwise instructed by your surgeon) Aleve, Naproxen, Ibuprofen, Motrin, Advil, Goody's, BC's, all herbal medications, fish oil, and all vitamins.  WHAT DO I DO ABOUT MY DIABETES MEDICATION?   Do not take oral diabetes medicines (pills) the morning of surgery.  THE MORNING OF SURGERY, do not take metFORMIN (GLUCOPHAGE)   HOW TO MANAGE YOUR DIABETES BEFORE AND AFTER SURGERY  Why is it important to control my blood sugar before and after surgery? Improving blood sugar levels before and after surgery helps healing and can limit problems. A way of improving blood sugar control is eating a healthy diet by:  Eating less sugar and carbohydrates  Increasing activity/exercise  Talking with your doctor about reaching your blood sugar goals High blood sugars (greater than 180 mg/dL) can raise your risk of infections and slow your recovery, so you will need to focus on controlling your diabetes during the weeks before surgery. Make sure that the doctor who takes care of your diabetes knows about your planned surgery including the date and  location.  How do I manage my blood sugar before surgery? Check your blood sugar at least 4 times a day, starting 2 days before surgery, to make sure that the level is not too high or low.  Check your blood sugar the morning of your surgery when you wake up and every 2 hours until you get to the Short Stay unit.  If your blood sugar is less than 70 mg/dL, you will need to treat for low blood sugar: Do not take insulin. Treat a low blood sugar (less than 70 mg/dL) with  cup of clear juice (cranberry or apple), 4 glucose tablets, OR glucose gel. Recheck blood sugar in 15 minutes after treatment (to make sure it is greater than 70 mg/dL). If your blood sugar is not greater than 70 mg/dL on recheck, call [phone] for further instructions. Report your blood sugar to the short stay nurse when you get to Short Stay.  If you are admitted to the hospital after surgery: Your blood sugar will be checked by the staff and you will probably be given insulin after surgery (instead of oral diabetes medicines) to make sure you have good blood sugar levels. The goal for blood sugar control after surgery is 80-180 mg/dL.           Do not wear jewelry or makeup Do not wear lotions, powders, perfumes/colognes, or deodorant. Do not shave 48 hours prior to surgery.  Men may shave face and neck. Do not bring valuables to the hospital. DO Not wear nail  polish, gel polish, artificial nails, or any other type of covering on natural nails including finger and toenails. If patients have artificial nails, gel coating, etc. that need to be removed by a nail salon please have this removed prior to surgery or surgery may need to be canceled/delayed if the surgeon/ anesthesia feels like the patient is unable to be adequately monitored.             [facility] is not responsible for any belongings or valuables.  Do NOT Smoke (Tobacco/Vaping) or drink Alcohol 24 hours prior to your procedure If you use a CPAP at night,  you may bring all equipment for your overnight stay.   Contacts, glasses, dentures or bridgework may not be worn into surgery, please bring cases for these belongings   For patients admitted to the hospital, discharge time will be determined by your treatment team.   Patients discharged the day of surgery will not be allowed to drive home, and someone needs to stay with them for 24 hours.  ONLY 1 SUPPORT PERSON MAY BE PRESENT WHILE YOU ARE IN SURGERY. IF YOU ARE TO BE ADMITTED ONCE YOU ARE IN YOUR ROOM YOU WILL BE ALLOWED TWO (2) VISITORS.  Minor children may have two parents present. Special consideration for safety and communication needs will be reviewed on a case by case basis.  Special instructions:    Oral Hygiene is also important to reduce your risk of infection.  Remember - BRUSH YOUR TEETH THE MORNING OF SURGERY WITH YOUR REGULAR TOOTHPASTE   [facility]- Preparing For Surgery  Before surgery, you can play an important role. Because skin is not sterile, your skin needs to be as free of germs as possible. You can reduce the number of germs on your skin by washing with CHG (chlorahexidine gluconate) Soap before surgery.  CHG is an antiseptic cleaner which kills germs and bonds with the skin to continue killing germs even after washing.     Please do not use if you have an allergy to CHG or antibacterial soaps. If your skin becomes reddened/irritated stop using the CHG.  Do not shave (including legs and underarms) for at least 48 hours prior to first CHG shower. It is OK to shave your face.  Please follow these instructions carefully.     Shower the NIGHT BEFORE SURGERY and the MORNING OF SURGERY with CHG Soap.   If you chose to wash your hair, wash your hair first as usual with your normal shampoo. After you shampoo, rinse your hair and body thoroughly to remove the shampoo.  Then [employer] and genitals (private parts) with your normal soap and rinse thoroughly to remove  soap.  After that Use CHG Soap as you would any other liquid soap. You can apply CHG directly to the skin and wash gently with a scrungie or a clean washcloth.   Apply the CHG Soap to your body ONLY FROM THE NECK DOWN.  Do not use on open wounds or open sores. Avoid contact with your eyes, ears, mouth and genitals (private parts). Wash Face and genitals (private parts)  with your normal soap.   Wash thoroughly, paying special attention to the area where your surgery will be performed.  Thoroughly rinse your body with warm water from the neck down.  DO NOT shower/wash with your normal soap after using and rinsing off the CHG Soap.  Pat yourself dry with a CLEAN TOWEL.  Wear CLEAN PAJAMAS to bed the night before  surgery  Place CLEAN SHEETS on your bed the night before your surgery  DO NOT SLEEP WITH PETS.   Day of Surgery: Take a shower with CHG soap. Wear Clean/Comfortable clothing the morning of surgery Do not apply any deodorants/lotions.   Remember to brush your teeth WITH YOUR REGULAR TOOTHPASTE.   Please read over the following fact sheets that you were given.

## 2020-09-15 ENCOUNTER — Encounter (HOSPITAL_COMMUNITY): Payer: Self-pay

## 2020-09-15 ENCOUNTER — Encounter (HOSPITAL_COMMUNITY)
Admission: RE | Admit: 2020-09-15 | Discharge: 2020-09-15 | Disposition: A | Discharge status: Home / Self Care | Payer: 59 | Source: Ambulatory Visit | Attending: Neurological Surgery | Admitting: Neurological Surgery

## 2020-09-15 ENCOUNTER — Other Ambulatory Visit: Payer: Self-pay

## 2020-09-15 DIAGNOSIS — Z01812 Encounter for preprocedural laboratory examination: Secondary | ICD-10-CM | POA: Insufficient documentation

## 2020-09-15 HISTORY — DX: Anxiety disorder, unspecified: F41.9

## 2020-09-15 HISTORY — DX: Prediabetes: R73.03

## 2020-09-15 LAB — BASIC METABOLIC PANEL
Anion gap: 9 (ref 5–15)
BUN: 11 mg/dL (ref 6–20)
CO2: 26 mmol/L (ref 22–32)
Calcium: 9.1 mg/dL (ref 8.9–10.3)
Chloride: 102 mmol/L (ref 98–111)
Creatinine, Ser: 0.98 mg/dL (ref 0.44–1.00)
GFR, Estimated: >60 mL/min (ref >60)
Glucose, Bld: 109 mg/dL — ABNORMAL HIGH (ref 70–99)
Potassium: 4.4 mmol/L (ref 3.5–5.1)
Sodium: 137 mmol/L (ref 135–145)

## 2020-09-15 LAB — CBC
HCT: 41 % (ref 36.0–46.0)
Hemoglobin: 12.7 g/dL (ref 12.0–15.0)
MCH: 27.1 pg (ref 26.0–34.0)
MCHC: 31 g/dL (ref 30.0–36.0)
MCV: 87.4 fL (ref 80.0–100.0)
Platelets: 268 10*3/uL (ref 150–400)
RBC: 4.69 MIL/uL (ref 3.87–5.11)
RDW: 15.2 % (ref 11.5–15.5)
WBC: 12.1 10*3/uL — ABNORMAL HIGH (ref 4.0–10.5)
nRBC: 0 % (ref 0.0–0.2)

## 2020-09-15 LAB — GLUCOSE, CAPILLARY: Glucose-Capillary: 114 mg/dL — ABNORMAL HIGH (ref 70–99)

## 2020-09-15 NOTE — Progress Notes (Signed)
PCP - Dr. Jone Baseman Cardiologist - denies  PPM/ICD - n/a Device Orders - n/a Rep Notified - n/a  Chest x-ray - n/a EKG - 10/20/19 Stress Test - denies ECHO - denies Cardiac Cath - denies  Sleep Study - Yes. OSA. Wears BiPAP nightly. Patient cannot remember settings. Requested sleep study from Malon Kindle at [facility].    Fasting Blood Sugar - 114 Checks Blood Sugar zero times a day. Patient states she is pre-diabetic and takes Metformin but does not check her blood sugar.   Blood Thinner Instructions: n/a Aspirin Instructions: n/a  ERAS Protcol - n/a   COVID TEST- Scheduled for 09/24/20 at [facility]    Anesthesia review: Yes.   Patient denies shortness of breath, fever, cough and chest pain at PAT appointment   All instructions explained to the patient, with a verbal understanding of the material. Patient agrees to go over the instructions while at home for a better understanding. Patient also instructed to self quarantine after being tested for COVID-19. The opportunity to ask questions was provided.

## 2020-09-15 NOTE — Progress Notes (Signed)
Surgical Instructions    Your procedure is scheduled on 09-28-20 at 0730.  Report to [facility] Main Entrance "A" at 0530 A.M., then check in with the Admitting office.  Call this number if you have problems the morning of surgery:  [phone]   If you have any questions prior to your surgery date call [phone]: Open Monday-Friday 8am-4pm    Remember:  Do not eat or drink after midnight the night before your surgery   Take these medicines the morning of surgery with A SIP OF WATER  acetaminophen (TYLENOL)     medroxyPROGESTERone (PROVERA) FLUoxetine (PROZAC)              Loratadine (CLARITIN)    As of today, STOP taking any Aspirin (unless otherwise instructed by your surgeon) Aleve, Naproxen, Ibuprofen, Motrin, Advil, Goody's, BC's, all herbal medications, fish oil, and all vitamins.  WHAT DO I DO ABOUT MY DIABETES MEDICATION?   Do not take oral diabetes medicines (pills) the morning of surgery.  THE MORNING OF SURGERY, do not take metFORMIN (GLUCOPHAGE)   HOW TO MANAGE YOUR DIABETES BEFORE AND AFTER SURGERY  Why is it important to control my blood sugar before and after surgery? Improving blood sugar levels before and after surgery helps healing and can limit problems. A way of improving blood sugar control is eating a healthy diet by:  Eating less sugar and carbohydrates  Increasing activity/exercise  Talking with your doctor about reaching your blood sugar goals High blood sugars (greater than 180 mg/dL) can raise your risk of infections and slow your recovery, so you will need to focus on controlling your diabetes during the weeks before surgery. Make sure that the doctor who takes care of your diabetes knows about your planned surgery including the date and location.  How do I manage my blood sugar before surgery? Check your blood sugar at least 4 times a day, starting 2 days before surgery, to make sure that the level is not too high or low.  Check your blood  sugar the morning of your surgery when you wake up and every 2 hours until you get to the Short Stay unit.  If your blood sugar is less than 70 mg/dL, you will need to treat for low blood sugar: Do not take insulin. Treat a low blood sugar (less than 70 mg/dL) with  cup of clear juice (cranberry or apple), 4 glucose tablets, OR glucose gel. Recheck blood sugar in 15 minutes after treatment (to make sure it is greater than 70 mg/dL). If your blood sugar is not greater than 70 mg/dL on recheck, call [phone] for further instructions. Report your blood sugar to the short stay nurse when you get to Short Stay.  If you are admitted to the hospital after surgery: Your blood sugar will be checked by the staff and you will probably be given insulin after surgery (instead of oral diabetes medicines) to make sure you have good blood sugar levels. The goal for blood sugar control after surgery is 80-180 mg/dL.           Do not wear jewelry or makeup Do not wear lotions, powders, perfumes/colognes, or deodorant. Do not shave 48 hours prior to surgery.  Men may shave face and neck. Do not bring valuables to the hospital. DO Not wear nail polish, gel polish, artificial nails, or any other type of covering on natural nails including finger and toenails. If patients have artificial nails, gel coating, etc. that need to be  removed by a nail salon please have this removed prior to surgery or surgery may need to be canceled/delayed if the surgeon/ anesthesia feels like the patient is unable to be adequately monitored.             [facility] is not responsible for any belongings or valuables.  Do NOT Smoke (Tobacco/Vaping) or drink Alcohol 24 hours prior to your procedure If you use a CPAP at night, you may bring all equipment for your overnight stay.   Contacts, glasses, dentures or bridgework may not be worn into surgery, please bring cases for these belongings   For patients admitted to the hospital,  discharge time will be determined by your treatment team.   Patients discharged the day of surgery will not be allowed to drive home, and someone needs to stay with them for 24 hours.  ONLY 1 SUPPORT PERSON MAY BE PRESENT WHILE YOU ARE IN SURGERY. IF YOU ARE TO BE ADMITTED ONCE YOU ARE IN YOUR ROOM YOU WILL BE ALLOWED TWO (2) VISITORS.  Minor children may have two parents present. Special consideration for safety and communication needs will be reviewed on a case by case basis.  Special instructions:    Oral Hygiene is also important to reduce your risk of infection.  Remember - BRUSH YOUR TEETH THE MORNING OF SURGERY WITH YOUR REGULAR TOOTHPASTE   - Preparing For Surgery  Before surgery, you can play an important role. Because skin is not sterile, your skin needs to be as free of germs as possible. You can reduce the number of germs on your skin by washing with CHG (chlorahexidine gluconate) Soap before surgery.  CHG is an antiseptic cleaner which kills germs and bonds with the skin to continue killing germs even after washing.     Please do not use if you have an allergy to CHG or antibacterial soaps. If your skin becomes reddened/irritated stop using the CHG.  Do not shave (including legs and underarms) for at least 48 hours prior to first CHG shower. It is OK to shave your face.  Please follow these instructions carefully.     Shower the NIGHT BEFORE SURGERY and the MORNING OF SURGERY with CHG Soap.   If you chose to wash your hair, wash your hair first as usual with your normal shampoo. After you shampoo, rinse your hair and body thoroughly to remove the shampoo.  Then [employer] and genitals (private parts) with your normal soap and rinse thoroughly to remove soap.  After that Use CHG Soap as you would any other liquid soap. You can apply CHG directly to the skin and wash gently with a scrungie or a clean washcloth.   Apply the CHG Soap to your body ONLY FROM THE NECK DOWN.   Do not use on open wounds or open sores. Avoid contact with your eyes, ears, mouth and genitals (private parts). Wash Face and genitals (private parts)  with your normal soap.   Wash thoroughly, paying special attention to the area where your surgery will be performed.  Thoroughly rinse your body with warm water from the neck down.  DO NOT shower/wash with your normal soap after using and rinsing off the CHG Soap.  Pat yourself dry with a CLEAN TOWEL.  Wear CLEAN PAJAMAS to bed the night before surgery  Place CLEAN SHEETS on your bed the night before your surgery  DO NOT SLEEP WITH PETS.   Day of Surgery: Take a shower with CHG soap.  Wear Clean/Comfortable clothing the morning of surgery Do not apply any deodorants/lotions.   Remember to brush your teeth WITH YOUR REGULAR TOOTHPASTE.   Please read over the following fact sheets that you were given.

## 2020-09-16 LAB — HEMOGLOBIN A1C
Hgb A1c MFr Bld: 5.8 % — ABNORMAL HIGH (ref 4.8–5.6)
Mean Plasma Glucose: 120 mg/dL

## 2020-09-24 ENCOUNTER — Other Ambulatory Visit: Payer: Self-pay

## 2020-09-24 ENCOUNTER — Other Ambulatory Visit
Admission: RE | Admit: 2020-09-24 | Discharge: 2020-09-24 | Disposition: A | Discharge status: Home / Self Care | Payer: 59 | Source: Ambulatory Visit | Attending: Neurological Surgery | Admitting: Neurological Surgery

## 2020-09-24 DIAGNOSIS — Z20822 Contact with and (suspected) exposure to covid-19: Secondary | ICD-10-CM | POA: Insufficient documentation

## 2020-09-24 DIAGNOSIS — Z01812 Encounter for preprocedural laboratory examination: Secondary | ICD-10-CM | POA: Insufficient documentation

## 2020-09-24 LAB — SARS CORONAVIRUS 2 (TAT 6-24 HRS): SARS Coronavirus 2: NEGATIVE

## 2020-09-27 NOTE — Anesthesia Preprocedure Evaluation (Addendum)
Anesthesia Evaluation  Patient identified by MRN, date of birth, ID band Patient awake    Reviewed: Allergy & Precautions, H&P , NPO status , Patient's Chart, lab work & pertinent test results  Airway Mallampati: III  TM Distance: >3 FB Neck ROM: Full    Dental no notable dental hx. (+) Teeth Intact, Dental Advisory Given   Pulmonary sleep apnea and Continuous Positive Airway Pressure Ventilation ,    Pulmonary exam normal breath sounds clear to auscultation       Cardiovascular Exercise Tolerance: Good hypertension, Pt. on medications  Rhythm:Regular Rate:Normal     Neuro/Psych Anxiety Depression negative neurological ROS     GI/Hepatic negative GI ROS, Neg liver ROS,   Endo/Other  Morbid obesity  Renal/GU negative Renal ROS  negative genitourinary   Musculoskeletal  (+) Arthritis , Osteoarthritis,    Abdominal   Peds  Hematology negative hematology ROS (+)   Anesthesia Other Findings   Reproductive/Obstetrics negative OB ROS                            Anesthesia Physical Anesthesia Plan  ASA: 3  Anesthesia Plan: General   Post-op Pain Management:    Induction: Intravenous  PONV Risk Score and Plan: 4 or greater and Ondansetron, Dexamethasone and Midazolam  Airway Management Planned: Oral ETT  Additional Equipment: Arterial line, CVP and Ultrasound Guidance Line Placement  Intra-op Plan:   Post-operative Plan: Extubation in OR  Informed Consent: I have reviewed the patients History and Physical, chart, labs and discussed the procedure including the risks, benefits and alternatives for the proposed anesthesia with the patient or authorized representative who has indicated his/her understanding and acceptance.     Dental advisory given  Plan Discussed with: CRNA  Anesthesia Plan Comments:        Anesthesia Quick Evaluation

## 2020-09-28 ENCOUNTER — Other Ambulatory Visit: Payer: Self-pay

## 2020-09-28 ENCOUNTER — Inpatient Hospital Stay (HOSPITAL_COMMUNITY)
Admission: RE | Admit: 2020-09-28 | Discharge: 2020-09-30 | DRG: 026 | Disposition: A | Discharge status: Home Health | Payer: 59 | Source: Ambulatory Visit | Attending: Neurological Surgery | Admitting: Neurological Surgery

## 2020-09-28 ENCOUNTER — Encounter (HOSPITAL_COMMUNITY): Payer: Self-pay | Admitting: Neurological Surgery

## 2020-09-28 ENCOUNTER — Inpatient Hospital Stay (HOSPITAL_COMMUNITY): Payer: 59

## 2020-09-28 ENCOUNTER — Inpatient Hospital Stay (HOSPITAL_COMMUNITY)
Admission: RE | Disposition: A | Discharge status: Home Health | Payer: Self-pay | Source: Ambulatory Visit | Attending: Neurological Surgery

## 2020-09-28 ENCOUNTER — Inpatient Hospital Stay (HOSPITAL_COMMUNITY): Payer: 59 | Admitting: Physician Assistant

## 2020-09-28 DIAGNOSIS — I4891 Unspecified atrial fibrillation: Secondary | ICD-10-CM

## 2020-09-28 DIAGNOSIS — I480 Paroxysmal atrial fibrillation: Secondary | ICD-10-CM | POA: Diagnosis present

## 2020-09-28 DIAGNOSIS — D320 Benign neoplasm of cerebral meninges: Secondary | ICD-10-CM | POA: Diagnosis not present

## 2020-09-28 DIAGNOSIS — Z6841 Body Mass Index (BMI) 40.0 and over, adult: Secondary | ICD-10-CM | POA: Diagnosis not present

## 2020-09-28 DIAGNOSIS — Z9989 Dependence on other enabling machines and devices: Secondary | ICD-10-CM | POA: Diagnosis not present

## 2020-09-28 DIAGNOSIS — M1990 Unspecified osteoarthritis, unspecified site: Secondary | ICD-10-CM | POA: Diagnosis present

## 2020-09-28 DIAGNOSIS — S0003XA Contusion of scalp, initial encounter: Secondary | ICD-10-CM | POA: Diagnosis not present

## 2020-09-28 DIAGNOSIS — G4733 Obstructive sleep apnea (adult) (pediatric): Secondary | ICD-10-CM | POA: Diagnosis not present

## 2020-09-28 DIAGNOSIS — Z9889 Other specified postprocedural states: Secondary | ICD-10-CM | POA: Diagnosis not present

## 2020-09-28 DIAGNOSIS — F419 Anxiety disorder, unspecified: Secondary | ICD-10-CM | POA: Diagnosis not present

## 2020-09-28 DIAGNOSIS — Z8261 Family history of arthritis: Secondary | ICD-10-CM

## 2020-09-28 DIAGNOSIS — R531 Weakness: Secondary | ICD-10-CM | POA: Diagnosis not present

## 2020-09-28 DIAGNOSIS — G9389 Other specified disorders of brain: Secondary | ICD-10-CM | POA: Diagnosis not present

## 2020-09-28 DIAGNOSIS — D329 Benign neoplasm of meninges, unspecified: Secondary | ICD-10-CM | POA: Diagnosis not present

## 2020-09-28 DIAGNOSIS — I10 Essential (primary) hypertension: Secondary | ICD-10-CM | POA: Diagnosis not present

## 2020-09-28 DIAGNOSIS — G4730 Sleep apnea, unspecified: Secondary | ICD-10-CM | POA: Diagnosis not present

## 2020-09-28 DIAGNOSIS — E785 Hyperlipidemia, unspecified: Secondary | ICD-10-CM | POA: Diagnosis not present

## 2020-09-28 DIAGNOSIS — D496 Neoplasm of unspecified behavior of brain: Secondary | ICD-10-CM | POA: Diagnosis present

## 2020-09-28 HISTORY — PX: APPLICATION OF CRANIAL NAVIGATION: SHX6578

## 2020-09-28 HISTORY — PX: CRANIOTOMY: SHX93

## 2020-09-28 LAB — POCT I-STAT 7, (LYTES, BLD GAS, ICA,H+H)
Acid-base deficit: 3 mmol/L — ABNORMAL HIGH (ref 0.0–2.0)
Acid-base deficit: 3 mmol/L — ABNORMAL HIGH (ref 0.0–2.0)
Acid-base deficit: 4 mmol/L — ABNORMAL HIGH (ref 0.0–2.0)
Bicarbonate: 23.1 mmol/L (ref 20.0–28.0)
Bicarbonate: 23.7 mmol/L (ref 20.0–28.0)
Bicarbonate: 22.8 mmol/L (ref 20.0–28.0)
Calcium, Ion: 1.14 mmol/L — ABNORMAL LOW (ref 1.15–1.40)
Calcium, Ion: 1.11 mmol/L — ABNORMAL LOW (ref 1.15–1.40)
Calcium, Ion: 1.1 mmol/L — ABNORMAL LOW (ref 1.15–1.40)
HCT: 34 % — ABNORMAL LOW (ref 36.0–46.0)
HCT: 30 % — ABNORMAL LOW (ref 36.0–46.0)
HCT: 29 % — ABNORMAL LOW (ref 36.0–46.0)
Hemoglobin: 11.6 g/dL — ABNORMAL LOW (ref 12.0–15.0)
Hemoglobin: 10.2 g/dL — ABNORMAL LOW (ref 12.0–15.0)
Hemoglobin: 9.9 g/dL — ABNORMAL LOW (ref 12.0–15.0)
O2 Saturation: 97 %
O2 Saturation: 97 %
O2 Saturation: 98 %
Patient temperature: 37
Patient temperature: 36.9
Potassium: 3.9 mmol/L (ref 3.5–5.1)
Potassium: 4 mmol/L (ref 3.5–5.1)
Potassium: 4.1 mmol/L (ref 3.5–5.1)
Sodium: 140 mmol/L (ref 135–145)
Sodium: 140 mmol/L (ref 135–145)
Sodium: 141 mmol/L (ref 135–145)
TCO2: 24 mmol/L (ref 22–32)
TCO2: 25 mmol/L (ref 22–32)
TCO2: 24 mmol/L (ref 22–32)
pCO2 arterial: 44.4 mmHg (ref 32.0–48.0)
pCO2 arterial: 46.1 mmHg (ref 32.0–48.0)
pCO2 arterial: 49.7 mmHg — ABNORMAL HIGH (ref 32.0–48.0)
pH, Arterial: 7.324 — ABNORMAL LOW (ref 7.350–7.450)
pH, Arterial: 7.318 — ABNORMAL LOW (ref 7.350–7.450)
pH, Arterial: 7.27 — ABNORMAL LOW (ref 7.350–7.450)
pO2, Arterial: 96 mmHg (ref 83.0–108.0)
pO2, Arterial: 104 mmHg (ref 83.0–108.0)
pO2, Arterial: 116 mmHg — ABNORMAL HIGH (ref 83.0–108.0)

## 2020-09-28 LAB — CBC
HCT: 31.4 % — ABNORMAL LOW (ref 36.0–46.0)
Hemoglobin: 9.5 g/dL — ABNORMAL LOW (ref 12.0–15.0)
MCH: 26.5 pg (ref 26.0–34.0)
MCHC: 30.3 g/dL (ref 30.0–36.0)
MCV: 87.7 fL (ref 80.0–100.0)
Platelets: 215 10*3/uL (ref 150–400)
RBC: 3.58 MIL/uL — ABNORMAL LOW (ref 3.87–5.11)
RDW: 14.8 % (ref 11.5–15.5)
WBC: 12.4 10*3/uL — ABNORMAL HIGH (ref 4.0–10.5)
nRBC: 0 % (ref 0.0–0.2)

## 2020-09-28 LAB — CREATININE, SERUM
Creatinine, Ser: 1 mg/dL (ref 0.44–1.00)
GFR, Estimated: >60 mL/min (ref >60)

## 2020-09-28 LAB — MRSA NEXT GEN BY PCR, NASAL: MRSA by PCR Next Gen: NOT DETECTED

## 2020-09-28 LAB — GLUCOSE, CAPILLARY
Glucose-Capillary: 132 mg/dL — ABNORMAL HIGH (ref 70–99)
Glucose-Capillary: 207 mg/dL — ABNORMAL HIGH (ref 70–99)
Glucose-Capillary: 216 mg/dL — ABNORMAL HIGH (ref 70–99)
Glucose-Capillary: 180 mg/dL — ABNORMAL HIGH (ref 70–99)

## 2020-09-28 LAB — POCT PREGNANCY, URINE: Preg Test, Ur: NEGATIVE

## 2020-09-28 LAB — PREPARE RBC (CROSSMATCH)

## 2020-09-28 LAB — ABO/RH: ABO/RH(D): A POS

## 2020-09-28 SURGERY — CRANIOTOMY TUMOR EXCISION
Anesthesia: General

## 2020-09-28 MED ORDER — MIDAZOLAM HCL 2 MG/2ML IJ SOLN
INTRAMUSCULAR | Status: AC
  Filled 2020-09-28: qty 2

## 2020-09-28 MED ORDER — THROMBIN 20000 UNITS EX SOLR
CUTANEOUS | Status: AC
  Filled 2020-09-28: qty 20000

## 2020-09-28 MED ORDER — HYDROMORPHONE HCL 1 MG/ML IJ SOLN
INTRAMUSCULAR | Status: AC
  Administered 2020-09-28: 0.5 mg via INTRAVENOUS
  Filled 2020-09-28: qty 1

## 2020-09-28 MED ORDER — CEFAZOLIN IN SODIUM CHLORIDE 3-0.9 GM/100ML-% IV SOLN
3.0000 g | INTRAVENOUS | Status: AC
  Administered 2020-09-28 (×2): 3 g via INTRAVENOUS
  Filled 2020-09-28: qty 100

## 2020-09-28 MED ORDER — DEXAMETHASONE SODIUM PHOSPHATE 10 MG/ML IJ SOLN
INTRAMUSCULAR | Status: AC
  Filled 2020-09-28: qty 1

## 2020-09-28 MED ORDER — HYDROCODONE-ACETAMINOPHEN 5-325 MG PO TABS
1.0000 | ORAL_TABLET | ORAL | Status: DC | PRN
  Administered 2020-09-28 – 2020-09-30 (×9): 1 via ORAL
  Filled 2020-09-28 (×9): qty 1

## 2020-09-28 MED ORDER — LIDOCAINE-EPINEPHRINE 1 %-1:100000 IJ SOLN
INTRAMUSCULAR | Status: DC | PRN
  Administered 2020-09-28: 10 mL

## 2020-09-28 MED ORDER — ACETAMINOPHEN 650 MG RE SUPP: 650.0000 mg | RECTAL | Status: DC | PRN

## 2020-09-28 MED ORDER — THROMBIN 5000 UNITS EX SOLR: OROMUCOSAL | Status: DC | PRN

## 2020-09-28 MED ORDER — 0.9 % SODIUM CHLORIDE (POUR BTL) OPTIME
TOPICAL | Status: DC | PRN
  Administered 2020-09-28: 3000 mL

## 2020-09-28 MED ORDER — SODIUM CHLORIDE 0.9 % IV SOLN: INTRAVENOUS | Status: DC | PRN

## 2020-09-28 MED ORDER — HYDROMORPHONE HCL 1 MG/ML IJ SOLN
0.5000 mg | INTRAMUSCULAR | Status: DC | PRN
  Administered 2020-09-28 – 2020-09-30 (×4): 0.5 mg via INTRAVENOUS
  Filled 2020-09-28: qty 1
  Filled 2020-09-28: qty 0.5
  Filled 2020-09-28 (×2): qty 1

## 2020-09-28 MED ORDER — SUGAMMADEX SODIUM 500 MG/5ML IV SOLN
INTRAVENOUS | Status: DC | PRN
  Administered 2020-09-28 (×2): 250 mg via INTRAVENOUS

## 2020-09-28 MED ORDER — THROMBIN 5000 UNITS EX SOLR
CUTANEOUS | Status: AC
  Filled 2020-09-28: qty 5000

## 2020-09-28 MED ORDER — DEXAMETHASONE SODIUM PHOSPHATE 10 MG/ML IJ SOLN
INTRAMUSCULAR | Status: DC | PRN
  Administered 2020-09-28: 10 mg via INTRAVENOUS

## 2020-09-28 MED ORDER — PROPOFOL 10 MG/ML IV BOLUS
INTRAVENOUS | Status: AC
  Filled 2020-09-28: qty 40

## 2020-09-28 MED ORDER — SODIUM CHLORIDE 0.9% IV SOLUTION: Freq: Once | INTRAVENOUS | Status: DC

## 2020-09-28 MED ORDER — FENTANYL CITRATE (PF) 250 MCG/5ML IJ SOLN
INTRAMUSCULAR | Status: AC
  Filled 2020-09-28: qty 5

## 2020-09-28 MED ORDER — LIDOCAINE-EPINEPHRINE 1 %-1:100000 IJ SOLN
INTRAMUSCULAR | Status: AC
  Filled 2020-09-28: qty 1

## 2020-09-28 MED ORDER — HYDROMORPHONE HCL 1 MG/ML IJ SOLN: 0.2500 mg | INTRAMUSCULAR | Status: DC | PRN

## 2020-09-28 MED ORDER — SUGAMMADEX SODIUM 500 MG/5ML IV SOLN
INTRAVENOUS | Status: AC
  Filled 2020-09-28: qty 5

## 2020-09-28 MED ORDER — ONDANSETRON HCL 4 MG/2ML IJ SOLN
INTRAMUSCULAR | Status: DC | PRN
  Administered 2020-09-28: 4 mg via INTRAVENOUS

## 2020-09-28 MED ORDER — HEMOSTATIC AGENTS (NO CHARGE) OPTIME
TOPICAL | Status: DC | PRN
  Administered 2020-09-28: 1 via TOPICAL

## 2020-09-28 MED ORDER — CEFAZOLIN SODIUM-DEXTROSE 2-4 GM/100ML-% IV SOLN
2.0000 g | Freq: Three times a day (TID) | INTRAVENOUS | Status: AC
  Administered 2020-09-28 – 2020-09-29 (×2): 2 g via INTRAVENOUS
  Filled 2020-09-28 (×3): qty 100

## 2020-09-28 MED ORDER — BACITRACIN ZINC 500 UNIT/GM EX OINT
TOPICAL_OINTMENT | CUTANEOUS | Status: DC | PRN
  Administered 2020-09-28: 1 via TOPICAL

## 2020-09-28 MED ORDER — ROCURONIUM BROMIDE 10 MG/ML (PF) SYRINGE
PREFILLED_SYRINGE | INTRAVENOUS | Status: DC | PRN
  Administered 2020-09-28: 20 mg via INTRAVENOUS
  Administered 2020-09-28: 60 mg via INTRAVENOUS
  Administered 2020-09-28: 40 mg via INTRAVENOUS
  Administered 2020-09-28 (×2): 20 mg via INTRAVENOUS
  Administered 2020-09-28: 40 mg via INTRAVENOUS
  Administered 2020-09-28: 20 mg via INTRAVENOUS

## 2020-09-28 MED ORDER — PROPOFOL 10 MG/ML IV BOLUS
INTRAVENOUS | Status: DC | PRN
  Administered 2020-09-28: 200 mg via INTRAVENOUS
  Administered 2020-09-28: 80 mg via INTRAVENOUS

## 2020-09-28 MED ORDER — DOCUSATE SODIUM 100 MG PO CAPS
100.0000 mg | ORAL_CAPSULE | Freq: Two times a day (BID) | ORAL | Status: DC
  Administered 2020-09-28 – 2020-09-30 (×3): 100 mg via ORAL
  Filled 2020-09-28 (×3): qty 1

## 2020-09-28 MED ORDER — FLUOXETINE HCL 20 MG PO CAPS
40.0000 mg | ORAL_CAPSULE | Freq: Every day | ORAL | Status: DC
  Administered 2020-09-29 – 2020-09-30 (×2): 40 mg via ORAL
  Filled 2020-09-28 (×2): qty 2

## 2020-09-28 MED ORDER — METFORMIN HCL 500 MG PO TABS
500.0000 mg | ORAL_TABLET | Freq: Every day | ORAL | Status: DC
  Administered 2020-09-29 – 2020-09-30 (×2): 500 mg via ORAL
  Filled 2020-09-28 (×2): qty 1

## 2020-09-28 MED ORDER — POLYETHYLENE GLYCOL 3350 17 G PO PACK: 17.0000 g | PACK | Freq: Every day | ORAL | Status: DC | PRN

## 2020-09-28 MED ORDER — ORAL CARE MOUTH RINSE: 15.0000 mL | Freq: Once | OROMUCOSAL | Status: AC

## 2020-09-28 MED ORDER — PHENYLEPHRINE 40 MCG/ML (10ML) SYRINGE FOR IV PUSH (FOR BLOOD PRESSURE SUPPORT)
PREFILLED_SYRINGE | INTRAVENOUS | Status: AC
  Filled 2020-09-28: qty 10

## 2020-09-28 MED ORDER — ACETAMINOPHEN 500 MG PO TABS
1000.0000 mg | ORAL_TABLET | Freq: Once | ORAL | Status: DC
  Filled 2020-09-28: qty 2

## 2020-09-28 MED ORDER — CHLORHEXIDINE GLUCONATE CLOTH 2 % EX PADS
6.0000 | MEDICATED_PAD | Freq: Every day | CUTANEOUS | Status: DC
  Administered 2020-09-28 – 2020-09-29 (×2): 6 via TOPICAL

## 2020-09-28 MED ORDER — ROCURONIUM BROMIDE 10 MG/ML (PF) SYRINGE
PREFILLED_SYRINGE | INTRAVENOUS | Status: AC
  Filled 2020-09-28: qty 10

## 2020-09-28 MED ORDER — LABETALOL HCL 5 MG/ML IV SOLN: 10.0000 mg | INTRAVENOUS | Status: DC | PRN

## 2020-09-28 MED ORDER — BACITRACIN ZINC 500 UNIT/GM EX OINT
TOPICAL_OINTMENT | CUTANEOUS | Status: AC
  Filled 2020-09-28: qty 28.35

## 2020-09-28 MED ORDER — PHENYLEPHRINE HCL-NACL 10-0.9 MG/250ML-% IV SOLN
INTRAVENOUS | Status: DC | PRN
  Administered 2020-09-28: 30 ug/min via INTRAVENOUS
  Administered 2020-09-28: 75 ug/min via INTRAVENOUS

## 2020-09-28 MED ORDER — EPHEDRINE SULFATE 50 MG/ML IJ SOLN
INTRAMUSCULAR | Status: DC | PRN
  Administered 2020-09-28: 5 mg via INTRAVENOUS

## 2020-09-28 MED ORDER — THROMBIN 20000 UNITS EX SOLR: CUTANEOUS | Status: DC | PRN

## 2020-09-28 MED ORDER — PHENYLEPHRINE 40 MCG/ML (10ML) SYRINGE FOR IV PUSH (FOR BLOOD PRESSURE SUPPORT)
PREFILLED_SYRINGE | INTRAVENOUS | Status: DC | PRN
  Administered 2020-09-28: 120 ug via INTRAVENOUS
  Administered 2020-09-28: 160 ug via INTRAVENOUS
  Administered 2020-09-28 (×2): 120 ug via INTRAVENOUS
  Administered 2020-09-28: 80 ug via INTRAVENOUS
  Administered 2020-09-28: 160 ug via INTRAVENOUS
  Administered 2020-09-28 (×2): 80 ug via INTRAVENOUS

## 2020-09-28 MED ORDER — LIDOCAINE 2% (20 MG/ML) 5 ML SYRINGE
INTRAMUSCULAR | Status: AC
  Filled 2020-09-28: qty 5

## 2020-09-28 MED ORDER — ACETAMINOPHEN 325 MG PO TABS
650.0000 mg | ORAL_TABLET | ORAL | Status: DC | PRN
  Administered 2020-09-29 – 2020-09-30 (×3): 650 mg via ORAL
  Filled 2020-09-28 (×3): qty 2

## 2020-09-28 MED ORDER — ONDANSETRON HCL 4 MG/2ML IJ SOLN
4.0000 mg | INTRAMUSCULAR | Status: DC | PRN
  Administered 2020-09-28 (×2): 4 mg via INTRAVENOUS
  Filled 2020-09-28 (×2): qty 2

## 2020-09-28 MED ORDER — ONDANSETRON HCL 4 MG/2ML IJ SOLN
INTRAMUSCULAR | Status: AC
  Filled 2020-09-28: qty 2

## 2020-09-28 MED ORDER — ONDANSETRON HCL 4 MG PO TABS: 4.0000 mg | ORAL_TABLET | ORAL | Status: DC | PRN

## 2020-09-28 MED ORDER — ALBUMIN HUMAN 5 % IV SOLN: INTRAVENOUS | Status: DC | PRN

## 2020-09-28 MED ORDER — CHLORHEXIDINE GLUCONATE CLOTH 2 % EX PADS: 6.0000 | MEDICATED_PAD | Freq: Once | CUTANEOUS | Status: DC

## 2020-09-28 MED ORDER — LACTATED RINGERS IV SOLN: INTRAVENOUS | Status: DC

## 2020-09-28 MED ORDER — FENTANYL CITRATE (PF) 250 MCG/5ML IJ SOLN
INTRAMUSCULAR | Status: DC | PRN
  Administered 2020-09-28: 100 ug via INTRAVENOUS
  Administered 2020-09-28 (×2): 50 ug via INTRAVENOUS

## 2020-09-28 MED ORDER — THROMBIN 20000 UNITS EX SOLR
CUTANEOUS | Status: AC
  Filled 2020-09-28: qty 40000

## 2020-09-28 MED ORDER — EPHEDRINE 5 MG/ML INJ
INTRAVENOUS | Status: AC
  Filled 2020-09-28: qty 10

## 2020-09-28 MED ORDER — LISINOPRIL 20 MG PO TABS
30.0000 mg | ORAL_TABLET | Freq: Every day | ORAL | Status: DC
  Administered 2020-09-29 – 2020-09-30 (×2): 30 mg via ORAL
  Filled 2020-09-28 (×2): qty 1

## 2020-09-28 MED ORDER — GABAPENTIN 300 MG PO CAPS
300.0000 mg | ORAL_CAPSULE | Freq: Every day | ORAL | Status: DC
  Administered 2020-09-28 – 2020-09-29 (×2): 300 mg via ORAL
  Filled 2020-09-28 (×2): qty 1

## 2020-09-28 MED ORDER — HYDROCHLOROTHIAZIDE 25 MG PO TABS
25.0000 mg | ORAL_TABLET | Freq: Every day | ORAL | Status: DC
  Administered 2020-09-30: 25 mg via ORAL
  Filled 2020-09-28: qty 1

## 2020-09-28 MED ORDER — SUCCINYLCHOLINE CHLORIDE 200 MG/10ML IV SOSY
PREFILLED_SYRINGE | INTRAVENOUS | Status: AC
  Filled 2020-09-28: qty 10

## 2020-09-28 MED ORDER — CHLORHEXIDINE GLUCONATE 0.12 % MT SOLN
15.0000 mL | Freq: Once | OROMUCOSAL | Status: AC
  Administered 2020-09-28: 15 mL via OROMUCOSAL
  Filled 2020-09-28: qty 15

## 2020-09-28 MED ORDER — THROMBIN 5000 UNITS EX SOLR
CUTANEOUS | Status: AC
  Filled 2020-09-28: qty 10000

## 2020-09-28 MED ORDER — LIDOCAINE 2% (20 MG/ML) 5 ML SYRINGE
INTRAMUSCULAR | Status: DC | PRN
  Administered 2020-09-28: 60 mg via INTRAVENOUS

## 2020-09-28 MED ORDER — PROMETHAZINE HCL 12.5 MG PO TABS
12.5000 mg | ORAL_TABLET | ORAL | Status: DC | PRN
  Filled 2020-09-28: qty 2

## 2020-09-28 MED ORDER — HEPARIN SODIUM (PORCINE) 5000 UNIT/ML IJ SOLN
5000.0000 [IU] | Freq: Three times a day (TID) | INTRAMUSCULAR | Status: DC
  Administered 2020-09-30: 5000 [IU] via SUBCUTANEOUS
  Filled 2020-09-28: qty 1

## 2020-09-28 MED ORDER — SUCCINYLCHOLINE CHLORIDE 200 MG/10ML IV SOSY
PREFILLED_SYRINGE | INTRAVENOUS | Status: DC | PRN
  Administered 2020-09-28: 140 mg via INTRAVENOUS

## 2020-09-28 SURGICAL SUPPLY — 72 items
BAG COUNTER SPONGE SURGICOUNT (BAG) ×9 IMPLANT
BAND RUBBER #18 3X1/16 STRL (MISCELLANEOUS) ×6 IMPLANT
BUR ACORN 9.0 PRECISION (BURR) ×3 IMPLANT
BUR ROUND FLUTED 4 SOFT TCH (BURR) ×3 IMPLANT
BUR SPIRAL ROUTER 2.3 (BUR) ×3 IMPLANT
CANISTER SUCT 3000ML PPV (MISCELLANEOUS) ×6 IMPLANT
CLIP VESOCCLUDE MED 6/CT (CLIP) ×9 IMPLANT
CNTNR URN SCR LID CUP LEK RST (MISCELLANEOUS) ×4 IMPLANT
CONT SPEC 4OZ STRL OR WHT (MISCELLANEOUS) ×2
COVER BURR HOLE UNIV 10 (Orthopedic Implant) ×9 IMPLANT
DECANTER SPIKE VIAL GLASS SM (MISCELLANEOUS) ×3 IMPLANT
DRAPE HALF SHEET 40X57 (DRAPES) ×3 IMPLANT
DRAPE MICROSCOPE LEICA (MISCELLANEOUS) ×3 IMPLANT
DRAPE NEUROLOGICAL W/INCISE (DRAPES) ×3 IMPLANT
DRAPE WARM FLUID 44X44 (DRAPES) ×3 IMPLANT
DURAPREP 26ML APPLICATOR (WOUND CARE) ×3 IMPLANT
ELECT REM PT RETURN 9FT ADLT (ELECTROSURGICAL) ×3
ELECTRODE REM PT RTRN 9FT ADLT (ELECTROSURGICAL) ×2 IMPLANT
EVACUATOR 1/8 PVC DRAIN (DRAIN) IMPLANT
EVACUATOR SILICONE 100CC (DRAIN) IMPLANT
FORCEPS BIPOLAR SPETZLER 8 1.0 (NEUROSURGERY SUPPLIES) ×3 IMPLANT
GAUZE 4X4 16PLY ~~LOC~~+RFID DBL (SPONGE) ×6 IMPLANT
GAUZE SPONGE 4X4 12PLY STRL (GAUZE/BANDAGES/DRESSINGS) IMPLANT
GLOVE EXAM NITRILE LRG STRL (GLOVE) IMPLANT
GLOVE EXAM NITRILE XS STR PU (GLOVE) IMPLANT
GLOVE SURG UNDER POLY LF SZ7 (GLOVE) ×9 IMPLANT
GLOVE SURG UNDER POLY LF SZ7.5 (GLOVE) ×9 IMPLANT
GOWN STRL REUS W/ TWL LRG LVL3 (GOWN DISPOSABLE) ×4 IMPLANT
GOWN STRL REUS W/ TWL XL LVL3 (GOWN DISPOSABLE) IMPLANT
GOWN STRL REUS W/TWL 2XL LVL3 (GOWN DISPOSABLE) IMPLANT
GOWN STRL REUS W/TWL LRG LVL3 (GOWN DISPOSABLE) ×2
GOWN STRL REUS W/TWL XL LVL3 (GOWN DISPOSABLE)
GRAFT DURAGEN MATRIX 3WX3L (Graft) ×1 IMPLANT
GRAFT DURAGEN MATRIX 3X3 SNGL (Graft) ×2 IMPLANT
HEMOSTAT POWDER KIT SURGIFOAM (HEMOSTASIS) ×6 IMPLANT
HEMOSTAT SURGICEL 2X14 (HEMOSTASIS) ×3 IMPLANT
IV NS 1000ML (IV SOLUTION) ×1
IV NS 1000ML BAXH (IV SOLUTION) ×2 IMPLANT
KIT BASIN OR (CUSTOM PROCEDURE TRAY) ×3 IMPLANT
KIT DRAIN CSF ACCUDRAIN (MISCELLANEOUS) IMPLANT
KIT TURNOVER KIT B (KITS) ×3 IMPLANT
MARKER SPHERE PSV REFLC 13MM (MARKER) ×9 IMPLANT
NEEDLE HYPO 22GX1.5 SAFETY (NEEDLE) ×3 IMPLANT
NEEDLE SPNL 18GX3.5 QUINCKE PK (NEEDLE) IMPLANT
NS IRRIG 1000ML POUR BTL (IV SOLUTION) ×9 IMPLANT
PACK CRANIOTOMY CUSTOM (CUSTOM PROCEDURE TRAY) ×3 IMPLANT
PATTIES SURGICAL .25X.25 (GAUZE/BANDAGES/DRESSINGS) IMPLANT
PATTIES SURGICAL .5 X.5 (GAUZE/BANDAGES/DRESSINGS) ×3 IMPLANT
PATTIES SURGICAL .5 X3 (DISPOSABLE) IMPLANT
PATTIES SURGICAL 1/4 X 3 (GAUZE/BANDAGES/DRESSINGS) IMPLANT
PATTIES SURGICAL 1X1 (DISPOSABLE) IMPLANT
PIN MAYFIELD SKULL DISP (PIN) ×3 IMPLANT
SCREW UNIII AXS SD 1.5X4 (Screw) ×39 IMPLANT
SPECIMEN JAR SMALL (MISCELLANEOUS) IMPLANT
SPONGE NEURO XRAY DETECT 1X3 (DISPOSABLE) IMPLANT
SPONGE SURGIFOAM ABS GEL 100 (HEMOSTASIS) ×6 IMPLANT
STAPLER VISISTAT 35W (STAPLE) ×3 IMPLANT
SUT ETHILON 3 0 FSL (SUTURE) IMPLANT
SUT ETHILON 3 0 PS 1 (SUTURE) IMPLANT
SUT MNCRL AB 3-0 PS2 18 (SUTURE) ×3 IMPLANT
SUT MON AB 3-0 SH 27 (SUTURE)
SUT MON AB 3-0 SH27 (SUTURE) IMPLANT
SUT NURALON 4 0 TR CR/8 (SUTURE) ×3 IMPLANT
SUT SILK 0 TIES 10X30 (SUTURE) IMPLANT
SUT VIC AB 2-0 CP2 18 (SUTURE) ×3 IMPLANT
TOWEL GREEN STERILE (TOWEL DISPOSABLE) ×3 IMPLANT
TOWEL GREEN STERILE FF (TOWEL DISPOSABLE) ×3 IMPLANT
TRAY FOL W/BAG SLVR 16FR STRL (SET/KITS/TRAYS/PACK) ×2 IMPLANT
TRAY FOLEY W/BAG SLVR 16FR LF (SET/KITS/TRAYS/PACK) ×1
TUBE CONNECTING 12X1/4 (SUCTIONS) ×3 IMPLANT
UNDERPAD 30X36 HEAVY ABSORB (UNDERPADS AND DIAPERS) ×3 IMPLANT
WATER STERILE IRR 1000ML POUR (IV SOLUTION) ×3 IMPLANT

## 2020-09-28 NOTE — Anesthesia Procedure Notes (Signed)
Arterial Line Insertion Start/End7/02/2021 7:00 AM, 09/28/2020 7:05 AM Performed by: CRNA  Patient location: Pre-op. Preanesthetic checklist: patient identified, IV checked, site marked, risks and benefits discussed, surgical consent, monitors and equipment checked, pre-op evaluation, timeout performed and anesthesia consent Lidocaine 1% used for infiltration Left, radial was placed Catheter size: 20 G Hand hygiene performed  and maximum sterile barriers used   Attempts: 1 Procedure performed without using ultrasound guided technique. Following insertion, Biopatch and dressing applied. Post procedure assessment: normal  Patient tolerated the procedure well with no immediate complications.

## 2020-09-28 NOTE — Progress Notes (Signed)
Holding off on CPAP for the night, pt just had a craniotomy and is very sore.

## 2020-09-28 NOTE — Progress Notes (Signed)
ECG read 2nd degree heart block. Dr. Zada Finders notified at this time. VSS.

## 2020-09-28 NOTE — Progress Notes (Signed)
Just spoke to MRI and they are afraid they are not able to accommodate patient based on the size of the MRI. I let Dr. Zada Finders know at this time and he said we will just do a CT scan tomorrow.   Montez Hageman RN

## 2020-09-28 NOTE — Op Note (Signed)
PATIENT: Belinda Garrett  DAY OF SURGERY: 09/28/20   PRE-OPERATIVE DIAGNOSIS:  Intracranial meningioma   POST-OPERATIVE DIAGNOSIS:  Same   PROCEDURE:  Left craniotomy for tumor resection   SURGEON:  Surgeon(s) and Role:    Judith Part, MD - Primary    Consuella Lose, MD - Assisting   ANESTHESIA: ETGA   BRIEF HISTORY: This is a 50 year old woman who was undergoing workup for a possible thyroid nodule and imaging revealed a very large intracranial meningioma that invaded the sagittal sinus. Given that it was nearly 5cm and invading the sinus, I recommended surgical resection. This was discussed with the patient as well as risks, benefits, and alternatives and wished to proceed with surgery.   OPERATIVE DETAIL: The patient was taken to the operating room, anesthesia was induced by the anesthesia team, the [place] head holder was placed on the patient's head and she was carefully placed prone on the OR table. Given her habitus, we paid particular attention to pad pressure points and remove any areas of focal pressure but it was difficult. Given the soft tissues hanging off the OR table, I used 3" tape to secure her to the OR table and made sure it was neutral forward-back to prevent tipping.   A formal time out was performed with two patient identifiers and confirmed the operative site. The registration array was attached to the [place]. This was co-registered with the patient's preoperative imaging, the fit appeared to be acceptable. Using frameless stereotaxy, the operative trajectory was planned and the incision was marked. The area was then prepped and draped in a sterile fashion.  A linear incision was placed centered over the tumor using frameless stereotaxy for guidance. Soft tissues were dissected to expose the SSS and a craniotomy flap was turned. I was elevating the dura off the bone flap with a penfield one when I got brisk bleeding. I quickly removed the bone flap and  there was a portion of tumor that had come off with the bone flap, exposing the lumen of the sinus. This obviously resulted in profuse bleeding. I got control of this with a large piece of gelfoam covering the defect and a cottonoid with gentle pressure.   The tumor was immediately evident and was debulked and circumferentially dissected free. Dr. Kathyrn Sheriff scrubbed in to help with retraction and tumor resection as well as dealing with some of the bleeding from the sinus. The portion invading the sinus was removed last. I then removed the involved dura and falx as far as possible. There did not appear to be any obvious remaining tumor but it was difficult to assess on brainlab due to shift. Durring dissection, care was paid to avoid disrupting traversing veins, given the sinus involvement. With resection complete, hemostasis was obtained and confirmed, the wound was copiously irrigated, and a piece of duragen was placed over the dural defect. The deep portion of the bone flap was shaved down and the flap was replaced with titanium plates and screws.   All instrument and sponge counts were correct, the incision was then closed in layers. The patient was then returned to anesthesia for emergence. No apparent complications at the completion of the procedure.   EBL:  2240mL   DRAINS: none   SPECIMENS: Occipital brain tumor   Judith Part, MD 09/28/20 7:29 AM

## 2020-09-28 NOTE — Progress Notes (Signed)
Neurosurgery Service Post-operative progress note  Assessment & Plan: 50 y.o. woman s/p crani for tumor resection, seen in PACU, Ox3, Fcx4, recovering well.  -admit to 4N ICU -MRI today or tomorrow to evaluate resection -SBP<180  Joyice Faster Konstantina Nachreiner  09/28/20 1:21 PM

## 2020-09-28 NOTE — Brief Op Note (Signed)
09/28/2020  12:56 PM  PATIENT:  [name]  50 y.o. female  PRE-OPERATIVE DIAGNOSIS:  Brain tumor  POST-OPERATIVE DIAGNOSIS:  Brain tumor  PROCEDURE:  Procedure(s) with comments: Left Occipital craniotomy for tumor resection (Left) - RM 20 APPLICATION OF CRANIAL NAVIGATION (N/A)  SURGEON:  Surgeon(s) and Role:    * Judith Part, MD - Primary  PHYSICIAN ASSISTANT:   ASSISTANTS: Consuella Lose MD   ANESTHESIA:   general  EBL:  2250 mL   BLOOD ADMINISTERED:none  DRAINS: none   LOCAL MEDICATIONS USED:  LIDOCAINE   SPECIMEN:  Source of Specimen:  Occipital brain tumor  DISPOSITION OF SPECIMEN:  PATHOLOGY  COUNTS:  YES  TOURNIQUET:  * No tourniquets in log *  DICTATION: .Note written in EPIC  PLAN OF CARE: Admit to inpatient   PATIENT DISPOSITION:  PACU - hemodynamically stable.   Delay start of Pharmacological VTE agent (>24hrs) due to surgical blood loss or risk of bleeding: yes

## 2020-09-28 NOTE — Anesthesia Postprocedure Evaluation (Signed)
Anesthesia Post Note  Patient: Belinda Garrett  Procedure(s) Performed: Left Occipital craniotomy for tumor resection (Left) APPLICATION OF CRANIAL NAVIGATION     Patient location during evaluation: PACU Anesthesia Type: General Level of consciousness: awake and alert Pain management: pain level controlled Vital Signs Assessment: post-procedure vital signs reviewed and stable Respiratory status: spontaneous breathing, nonlabored ventilation, respiratory function stable and patient connected to nasal cannula oxygen Cardiovascular status: blood pressure returned to baseline, stable and tachycardic Postop Assessment: no apparent nausea or vomiting Anesthetic complications: no Comments: Pt noted to be in afib/flutter during the case and into PACU. D/w Dr. Zada Finders and he agreed to get an EKG and consult Cardiology to further evaluate.   No notable events documented.  Last Vitals:  Vitals:   09/28/20 1500 09/28/20 1600  BP: 107/73 104/69  Pulse: (!) 103 99  Resp: 14 (!) 28  Temp:  36.9 C  SpO2: 91% 95%    Last Pain:  Vitals:   09/28/20 1634  TempSrc:   PainSc: Asleep                 Leaf [employer],W. EDMOND

## 2020-09-28 NOTE — H&P (Signed)
Surgical H&P Update  HPI: 50 y.o. woman with large intracranial meningioma, here for surgical resection. No changes in health since she was last seen.   PMHx:  Past Medical History:  Diagnosis Date   Abnormal menses    Allergy    Anxiety    Arthritis    Back pain    Back pain    Chicken pox    Depression    Hypertension    Joint pain    Joint pain    Lymphedema    Palpitations    Peripheral neuropathy 09/30/2018   Pre-diabetes    Prediabetes    Rosacea    Seasonal allergies    Sleep apnea    Stasis dermatitis of both legs    Vertigo    FamHx:  Family History  Problem Relation Age of Onset   Arthritis Mother    Depression Mother    Hypertension Mother    Thyroid disease Mother    Obesity Mother    Depression Father    Parkinson's disease Father    Dementia Father    Obesity Father    Arthritis Maternal Grandmother    Heart disease Maternal Grandmother    Hypertension Maternal Grandmother    Hyperlipidemia Maternal Grandmother    Stroke Maternal Grandmother    Alcohol abuse Maternal Grandfather    Arthritis Maternal Grandfather    Depression Maternal Grandfather    Hearing loss Maternal Grandfather    Heart disease Maternal Grandfather    Stroke Maternal Grandfather    Arthritis Paternal Grandmother    Arthritis Paternal Grandfather    Depression Paternal Grandfather    Heart disease Paternal Grandfather    Hypertension Paternal Grandfather    Stroke Paternal Grandfather    Breast cancer Neg Hx    SocHx:  reports that she has never smoked. She has never used smokeless tobacco. She reports previous alcohol use. She reports previous drug use.  Physical Exam: AOx3, PERRL, FS, TM  Strength 5/5 x4, SILTx4  Assesment/Plan: 50 y.o. woman with large posterior left convexity meningioma, here for craniotomy and surgical resection. Risks, benefits, and alternatives discussed and the patient would like to continue with surgery.  -OR today -4N post-op  Judith Part, MD 09/28/20 7:28 AM

## 2020-09-28 NOTE — Transfer of Care (Signed)
Immediate Anesthesia Transfer of Care Note  Patient: Belinda Garrett  Procedure(s) Performed: Left Occipital craniotomy for tumor resection (Left) APPLICATION OF CRANIAL NAVIGATION  Patient Location: PACU  Anesthesia Type:General  Level of Consciousness: awake and drowsy  Airway & Oxygen Therapy: Patient Spontanous Breathing and Patient connected to face mask oxygen  Post-op Assessment: Report given to RN and Post -op Vital signs reviewed and stable  Post vital signs: Reviewed and stable  Last Vitals:  Vitals Value Taken Time  BP 112/59 09/28/20 1258  Temp    Pulse 131 09/28/20 1302  Resp 18 09/28/20 1302  SpO2 96 % 09/28/20 1302  Vitals shown include unvalidated device data.  Last Pain:  Vitals:   09/28/20 0613  TempSrc:   PainSc: 0-No pain         Complications: No notable events documented.

## 2020-09-28 NOTE — Anesthesia Procedure Notes (Signed)
Procedure Name: Intubation Date/Time: 09/28/2020 7:44 AM Performed by: Trinna Post., CRNA Pre-anesthesia Checklist: Patient identified, Emergency Drugs available, Suction available and Patient being monitored Patient Re-evaluated:Patient Re-evaluated prior to induction Oxygen Delivery Method: Circle system utilized Preoxygenation: Pre-oxygenation with 100% oxygen Induction Type: IV induction Ventilation: Mask ventilation without difficulty Laryngoscope Size: Glidescope and 3 Grade View: Grade I Tube type: Oral Tube size: 7.0 mm Number of attempts: 1 Airway Equipment and Method: Stylet and Oral airway Placement Confirmation: ETT inserted through vocal cords under direct vision, positive ETCO2 and breath sounds checked- equal and bilateral Secured at: 22 cm Tube secured with: Tape Dental Injury: Teeth and Oropharynx as per pre-operative assessment  Comments: Inserted by Paulina Fusi, SRNA

## 2020-09-29 ENCOUNTER — Encounter (HOSPITAL_COMMUNITY): Payer: Self-pay | Admitting: Neurological Surgery

## 2020-09-29 ENCOUNTER — Inpatient Hospital Stay (HOSPITAL_COMMUNITY): Payer: 59

## 2020-09-29 LAB — SURGICAL PATHOLOGY

## 2020-09-29 LAB — GLUCOSE, CAPILLARY
Glucose-Capillary: 158 mg/dL — ABNORMAL HIGH (ref 70–99)
Glucose-Capillary: 127 mg/dL — ABNORMAL HIGH (ref 70–99)
Glucose-Capillary: 184 mg/dL — ABNORMAL HIGH (ref 70–99)
Glucose-Capillary: 101 mg/dL — ABNORMAL HIGH (ref 70–99)
Glucose-Capillary: 160 mg/dL — ABNORMAL HIGH (ref 70–99)

## 2020-09-29 MED ORDER — VITAMIN D 25 MCG (1000 UNIT) PO TABS
2000.0000 [IU] | ORAL_TABLET | Freq: Every day | ORAL | Status: DC
  Administered 2020-09-29 – 2020-09-30 (×2): 2000 [IU] via ORAL
  Filled 2020-09-29 (×2): qty 2

## 2020-09-29 MED ORDER — MEDROXYPROGESTERONE ACETATE 2.5 MG PO TABS
2.5000 mg | ORAL_TABLET | Freq: Every day | ORAL | Status: DC
  Administered 2020-09-29 – 2020-09-30 (×2): 2.5 mg via ORAL
  Filled 2020-09-29 (×2): qty 1

## 2020-09-29 MED ORDER — IOHEXOL 300 MG/ML  SOLN
75.0000 mL | Freq: Once | INTRAMUSCULAR | Status: AC | PRN
  Administered 2020-09-29: 75 mL via INTRAVENOUS

## 2020-09-29 MED FILL — Thrombin For Soln 5000 Unit: CUTANEOUS | Qty: 5000 | Status: AC

## 2020-09-29 NOTE — Evaluation (Signed)
Occupational Therapy Evaluation Patient Details Name: Belinda Garrett MRN: [id number] DOB: Nov 28, 1970 Today's Date: 09/29/2020    History of Present Illness Pt is a 50 y.o. female who presented 7/12 for elective surgical resection of large posterior left convexity intracranial meningioma. S/p L occipital crani and resection 7/12. PMH: anxiety, arthritis, depression, HTN, lymphedema, palpitations, peripheral neuropathy, pre-diabetes, rosacea, sleep apnea, and vertigo.   Clinical Impression   Pt admitted with above. She demonstrates the below listed deficits and will benefit from continued OT to maximize safety and independence with BADLs.  Pt presents to OT with generalized weakness, decreased activity tolerance, increased pain, and impaired balance.  She currently requires mod  A for LB ADLs, and min A +2 for functional transfers.  She reports she lives alone, but will have assist from family initially upon discharge to home.  She reports she was mod I with ADLs and IADLs PTA, but does fatigue with activity.  Will follow acutely.      Follow Up Recommendations  Home health OT;Supervision - Intermittent    Equipment Recommendations  None recommended by OT    Recommendations for Other Services       Precautions / Restrictions Precautions Precautions: Fall Precaution Comments: A-line Restrictions Weight Bearing Restrictions: No      Mobility Bed Mobility Overal bed mobility: Needs Assistance Bed Mobility: Supine to Sit     Supine to sit: Min assist;+2 for physical assistance;HOB elevated     General bed mobility comments: assist to shift hips and scoot to EOB.  She was somewhat limited by inability to use Lt UE secondary to A-line placement    Transfers Overall transfer level: Needs assistance Equipment used: 2 person hand held assist Transfers: Sit to/from [employer] Sit to Stand: Min assist;+2 safety/equipment;+2 physical assistance Stand pivot  transfers: Min assist;+2 physical assistance;+2 safety/equipment       General transfer comment: Pt requires assist to boost into standing and assist to steady when transferring.  HR to 158 and DOE 3/5    Balance Overall balance assessment: Needs assistance Sitting-balance support: Feet supported Sitting balance-Leahy Scale: Fair Sitting balance - Comments: Static sitting EOB with supervision.   Standing balance support: Bilateral upper extremity supported Standing balance-Leahy Scale: Poor Standing balance comment: requires UE support                           ADL either performed or assessed with clinical judgement   ADL Overall ADL's : Needs assistance/impaired Eating/Feeding: Independent   Grooming: Wash/dry hands;Wash/dry face;Oral care;Set up;Sitting   Upper Body Bathing: Set up;Sitting   Lower Body Bathing: Moderate assistance;Sit to/from stand   Upper Body Dressing : Set up;Sitting   Lower Body Dressing: Maximal assistance;Sit to/from stand Lower Body Dressing Details (indicate cue type and reason): Pt is dependent on lower surface to don pants. Toilet Transfer: Minimal assistance;+2 for safety/equipment;Stand-pivot;BSC   Toileting- Clothing Manipulation and Hygiene: Moderate assistance;Sit to/from stand       Functional mobility during ADLs: Minimal assistance;+2 for safety/equipment General ADL Comments: Pt fatigued quickly with activity     Vision Baseline Vision/History: Wears glasses Wears Glasses: At all times Patient Visual Report: No change from baseline Vision Assessment?: Yes Eye Alignment: Within Functional Limits Alignment/Gaze Preference: Within Defined Limits Tracking/Visual Pursuits: Able to track stimulus in all quads without difficulty Convergence: Impaired (comment) Additional Comments: Pt reports she wears glasses all the time, and has worn them since she was [age].  She was able to maintain convergence to ~10" from nose      Perception Perception Perception Tested?: Yes   Praxis Praxis Praxis tested?: Within functional limits    Pertinent Vitals/Pain Pain Assessment: 0-10 Pain Score: 5  Pain Location: posterior aspect of head Pain Descriptors / Indicators: Aching;Grimacing;Discomfort;Sore Pain Intervention(s): Limited activity within patient's tolerance;Monitored during session     Hand Dominance Right   Extremity/Trunk Assessment Upper Extremity Assessment Upper Extremity Assessment: Overall WFL for tasks assessed   Lower Extremity Assessment Lower Extremity Assessment: Defer to PT evaluation RLE Deficits / Details: Limited knee extension AROM with standing, reports this is her baseline; fairly symmetrical bil lower extremity strength; hx of peripheral neuropathy; no dysdiadochokinesia or dysmetria noted RLE Sensation: history of peripheral neuropathy LLE Deficits / Details: Limited knee extension AROM with standing, reports this is her baseline; fairly symmetrical bil lower extremity strength; hx of peripheral neuropathy; no dysdiadochokinesia or dysmetria noted LLE Sensation: history of peripheral neuropathy   Cervical / Trunk Assessment Cervical / Trunk Assessment: Kyphotic   Communication Communication Communication: No difficulties   Cognition Arousal/Alertness: Awake/alert Behavior During Therapy: WFL for tasks assessed/performed Overall Cognitive Status: Within Functional Limits for tasks assessed                                     General Comments  HR to 158 with transfer to chair.  DOE 3/4    Exercises     Shoulder Instructions      Home Living Family/patient expects to be discharged to:: Private residence Living Arrangements: Alone Available Help at Discharge: Family;Available PRN/intermittently (24/7 availability initially) Type of Home: House Home Access: Stairs to enter [employer] of Steps: 1 Entrance Stairs-Rails: None Home Layout: One  level     Bathroom Shower/Tub: Occupational psychologist: Handicapped height     Home Equipment: Cane - single point;Shower seat - built in;Grab bars - tub/shower (can reach grab bar in shower from toilet)          Prior Functioning/Environment Level of Independence: Independent with assistive device(s)        Comments: Uses SPC. Drives. 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  She reports she fatigues with ambulation and is only able to tolerate 25-30ft at at time.  She has her groceries delivered        OT Problem List: Decreased activity tolerance;Impaired balance (sitting and/or standing);Decreased knowledge of use of DME or AE;Cardiopulmonary status limiting activity;Obesity;Pain      OT Treatment/Interventions: Self-care/ADL training;DME and/or AE instruction;Therapeutic activities;Patient/family education;Balance training    OT Goals(Current goals can be found in the care plan section) Acute Rehab OT Goals Patient Stated Goal: to get out of the bed and get back to normal OT Goal Formulation: With patient Time For Goal Achievement: 10/13/20 Potential to Achieve Goals: Good ADL Goals Pt Will Perform Lower Body Bathing: sit to/from stand;with supervision;with adaptive equipment Pt Will Perform Lower Body Dressing: with supervision;sit to/from stand;with adaptive equipment Pt Will Transfer to Toilet: with supervision;ambulating;regular height toilet;grab bars Pt Will Perform Toileting - Clothing Manipulation and hygiene: with supervision;with adaptive equipment;sit to/from stand Pt Will Perform Tub/Shower Transfer: Shower transfer;with supervision;grab bars;rolling walker;tub bench  OT Frequency: Min 2X/week   Barriers to D/C:            Co-evaluation PT/OT/SLP Co-Evaluation/Treatment: Yes Reason for Co-Treatment: For patient/therapist safety;To address  functional/ADL transfers PT goals addressed during session: Mobility/safety  with mobility;Balance;Proper use of DME OT goals addressed during session: ADL's and self-care      AM-PAC OT "6 Clicks" Daily Activity     Outcome Measure Help from another person eating meals?: None Help from another person taking care of personal grooming?: A Little Help from another person toileting, which includes using toliet, bedpan, or urinal?: A Lot Help from another person bathing (including washing, rinsing, drying)?: A Lot Help from another person to put on and taking off regular upper body clothing?: A Little Help from another person to put on and taking off regular lower body clothing?: A Lot 6 Click Score: 16   End of Session Nurse Communication: Mobility status  Activity Tolerance: Patient tolerated treatment well Patient left: in chair;with call bell/phone within reach  OT Visit Diagnosis: Unsteadiness on feet (R26.81)                Time: 0600-4599 OT Time Calculation (min): 37 min Charges:  OT General Charges $OT Visit: 1 Visit OT Evaluation $OT Eval Moderate Complexity: 1 Mod  Daren Doswell C., OTR/L Acute Rehabilitation Services Pager [phone] Office [phone]    Lucille Passy M 09/29/2020, 12:45 PM

## 2020-09-29 NOTE — Progress Notes (Signed)
Neurosurgery Service Progress Note  Subjective: No acute events overnight, no new weakness or numbness, expected scalp pain   Objective: Vitals:   09/29/20 0700 09/29/20 0800 09/29/20 0900 09/29/20 1000  BP: 103/65 105/62 (!) 118/92 110/63  Pulse: (!) 105 98 (!) 109 (!) 113  Resp: (!) 24 (!) 21 (!) 24 (!) 23  Temp:  98.7 F (37.1 C)    TempSrc:  Oral    SpO2: 97% 97% 100% 99%  Weight:      Height:        Physical Exam: AOx3, PERRL, EOMI, FS, TM, Strength 5/5 x4, SILTx4, no drift  Assessment & Plan: 50 y.o. woman s/p resection of large meningioma, recovering well. Post-op CTH w/wo with likely GTR, small SDH.  -transfer to floor today, PT/OT  Judith Part  09/29/20 10:44 AM

## 2020-09-29 NOTE — Evaluation (Addendum)
Physical Therapy Evaluation Patient Details Name: Belinda Garrett MRN: [id number] DOB: Feb 02, 1971 Today's Date: 09/29/2020   History of Present Illness  Pt is a 50 y.o. female who presented 7/12 for elective surgical resection of large posterior left convexity intracranial meningioma. S/p L occipital crani and resection 7/12. PMH: anxiety, arthritis, depression, HTN, lymphedema, palpitations, peripheral neuropathy, pre-diabetes, rosacea, sleep apnea, and vertigo.   Clinical Impression  Pt presents with condition above and deficits mentioned below, see PT Problem List. PTA, she was walking short bedroom distances at a time with mod I utilizing her SPC in her R hand. She was living alone in a 1-level house with 1 STE. Currently, pt demonstrates deficits in general strength, bil knee extension AROM (reports this is her baseline), activity tolerance, and balance that impact her safety and independence with all functional mobility. Pt required minAx2 for transfers and to ambulate a few ft with her SPC this date before she fatigued and her HR increased to 153 bpm. Recommend follow-up with HHPT. Will continue to follow acutely.    Follow Up Recommendations Supervision for mobility/OOB;Home health PT    Equipment Recommendations  None recommended by PT    Recommendations for Other Services       Precautions / Restrictions Precautions Precautions: Fall Precaution Comments: A-line Restrictions Weight Bearing Restrictions: No      Mobility  Bed Mobility Overal bed mobility: Needs Assistance Bed Mobility: Supine to Sit     Supine to sit: Min assist;+2 for physical assistance;HOB elevated     General bed mobility comments: assist to shift hips and scoot to EOB.  She was somewhat limited by inability to use Lt UE secondary to A-line placement    Transfers Overall transfer level: Needs assistance Equipment used: 2 person hand held assist Transfers: Sit to/from [employer] Sit to Stand: Min assist;+2 safety/equipment;+2 physical assistance Stand pivot transfers: Min assist;+2 physical assistance;+2 safety/equipment       General transfer comment: Pt requires assist to boost into standing and assist to steady when transferring.  HR to 158 and DOE 3/5  Ambulation/Gait Ambulation/Gait assistance: Min assist;+2 physical assistance;+2 safety/equipment Gait Distance (Feet): 4 Feet Assistive device: Straight cane Gait Pattern/deviations: Step-through pattern;Decreased stride length;Trunk flexed Gait velocity: reduced Gait velocity interpretation: <1.31 ft/sec, indicative of household ambulator General Gait Details: Pt taking fairly symmetrical bil step lengths but overall decreased stride length. Pt with bil knee flexion in standing, reporting this is her baseline. MinAx2 to steady with pt using SPC in R hand. Fatigues quickly.  Stairs            Wheelchair Mobility    Modified Rankin (Stroke Patients Only) Modified Rankin (Stroke Patients Only) Pre-Morbid Rankin Score: Slight disability Modified Rankin: Moderately severe disability     Balance Overall balance assessment: Needs assistance Sitting-balance support: Feet supported Sitting balance-Leahy Scale: Fair Sitting balance - Comments: Static sitting EOB with supervision.   Standing balance support: Bilateral upper extremity supported Standing balance-Leahy Scale: Poor Standing balance comment: requires UE support                             Pertinent Vitals/Pain Pain Assessment: 0-10 Pain Score: 5  Pain Location: posterior aspect of head Pain Descriptors / Indicators: Aching;Grimacing;Discomfort;Sore Pain Intervention(s): Limited activity within patient's tolerance;Monitored during session    [place] expects to be discharged to:: Private residence Living Arrangements: Alone Available Help at Discharge: Family;Available PRN/intermittently (24/7  availability  initially) Type of Home: House Home Access: Stairs to enter Entrance Stairs-Rails: None Entrance Stairs-Number of Steps: 1 Home Layout: One level Home Equipment: Cane - single point;Shower seat - built in;Grab bars - tub/shower (can reach grab bar in shower from toilet)      Prior Function Level of Independence: Independent with assistive device(s)         Comments: Uses SPC. Drives. 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  She reports she fatigues with ambulation and is only able to tolerate 25-80ft at at time.  She has her groceries delivered     Hand Dominance   Dominant Hand: Right    Extremity/Trunk Assessment   Upper Extremity Assessment Upper Extremity Assessment: Overall WFL for tasks assessed    Lower Extremity Assessment Lower Extremity Assessment: Defer to PT evaluation RLE Deficits / Details: Limited knee extension AROM with standing, reports this is her baseline; fairly symmetrical bil lower extremity strength; hx of peripheral neuropathy; no dysdiadochokinesia or dysmetria noted RLE Sensation: history of peripheral neuropathy LLE Deficits / Details: Limited knee extension AROM with standing, reports this is her baseline; fairly symmetrical bil lower extremity strength; hx of peripheral neuropathy; no dysdiadochokinesia or dysmetria noted LLE Sensation: history of peripheral neuropathy    Cervical / Trunk Assessment Cervical / Trunk Assessment: Kyphotic  Communication   Communication: No difficulties  Cognition Arousal/Alertness: Awake/alert Behavior During Therapy: WFL for tasks assessed/performed Overall Cognitive Status: Within Functional Limits for tasks assessed                                        General Comments General comments (skin integrity, edema, etc.): HR to 158 with transfer to chair.  DOE 3/4    Exercises     Assessment/Plan    PT Assessment Patient needs continued PT services   PT Problem List Decreased strength;Decreased range of motion;Decreased activity tolerance;Decreased balance;Decreased mobility;Impaired sensation;Obesity       PT Treatment Interventions DME instruction;Gait training;Stair training;Functional mobility training;Therapeutic activities;Therapeutic exercise;Balance training;Neuromuscular re-education;Patient/family education    PT Goals (Current goals can be found in the Care Plan section)  Acute Rehab PT Goals Patient Stated Goal: to get out of the bed and get back to normal PT Goal Formulation: With patient Time For Goal Achievement: 10/13/20 Potential to Achieve Goals: Good    Frequency Min 3X/week   Barriers to discharge        Co-evaluation PT/OT/SLP Co-Evaluation/Treatment: Yes Reason for Co-Treatment: For patient/therapist safety;To address functional/ADL transfers PT goals addressed during session: Mobility/safety with mobility;Balance;Proper use of DME OT goals addressed during session: ADL's and self-care       AM-PAC PT "6 Clicks" Mobility  Outcome Measure Help needed turning from your back to your side while in a flat bed without using bedrails?: A Little Help needed moving from lying on your back to sitting on the side of a flat bed without using bedrails?: A Little Help needed moving to and from a bed to a chair (including a wheelchair)?: A Lot Help needed standing up from a chair using your arms (e.g., wheelchair or bedside chair)?: A Lot Help needed to walk in hospital room?: A Lot Help needed climbing 3-5 steps with a railing? : A Lot 6 Click Score: 14    End of Session   Activity Tolerance: Patient tolerated treatment well Patient left: in chair;with call bell/phone within  reach Nurse Communication: Mobility status PT Visit Diagnosis: Unsteadiness on feet (R26.81);Other abnormalities of gait and mobility (R26.89);Muscle weakness (generalized) (M62.81);Difficulty in walking, not elsewhere classified (R26.2)     Time: 8441-7127 PT Time Calculation (min) (ACUTE ONLY): 28 min   Charges:   PT Evaluation $PT Eval Moderate Complexity: 1 Mod          Moishe Spice, PT, DPT Acute Rehabilitation Services  Pager: [phone] Office: [phone]   Orvan Falconer 09/29/2020, 12:50 PM

## 2020-09-30 ENCOUNTER — Other Ambulatory Visit: Payer: Self-pay

## 2020-09-30 LAB — TYPE AND SCREEN
ABO/RH(D): A POS
Antibody Screen: NEGATIVE
Unit division: 0
Unit division: 0

## 2020-09-30 LAB — BPAM RBC
Blood Product Expiration Date: 202208072359
Blood Product Expiration Date: 202208072359
ISSUE DATE / TIME: 202207120943
ISSUE DATE / TIME: 202207120943
Unit Type and Rh: 6200
Unit Type and Rh: 6200

## 2020-09-30 LAB — GLUCOSE, CAPILLARY
Glucose-Capillary: 117 mg/dL — ABNORMAL HIGH (ref 70–99)
Glucose-Capillary: 121 mg/dL — ABNORMAL HIGH (ref 70–99)
Glucose-Capillary: 169 mg/dL — ABNORMAL HIGH (ref 70–99)
Glucose-Capillary: 96 mg/dL (ref 70–99)

## 2020-09-30 MED ORDER — HYDROCODONE-ACETAMINOPHEN 5-325 MG PO TABS
1.0000 | ORAL_TABLET | ORAL | 0 refills | Status: DC | PRN
  Filled 2020-09-30: qty 30, 5d supply, fill #0

## 2020-09-30 NOTE — Discharge Summary (Signed)
Discharge Summary  Date of Admission: 09/28/2020  Date of Discharge: 09/30/20  Attending Physician: Emelda Brothers, MD  Hospital Course: Patient was admitted following an uncomplicated craniotomy for resection of a large posterior midline meningioma invading the SSS. She was recovered in PACU and transferred to 4N. A post-op CTH w/wo contrast was done because the patient could not fit in to the MRI scanner in the hospital, which showed likely GTR. Her hospital course was notable for Afib with occasional RVR that was asymptomatic, for which she will follow up with cardiology as an outpatient. Her hospital stay was otherwise uncomplicated and the patient was discharged home on 09/30/20. She will follow up in clinic with me in 2 weeks.  Neurologic exam at discharge:  AOx3, PERRL, EOMI, FS, TM Strength 5/5 x4, SILTx4, no drift  Discharge diagnosis: Intracranial meningioma  Judith Part, MD 09/30/20 8:58 AM

## 2020-09-30 NOTE — Plan of Care (Signed)
  Problem: Education: Goal: Knowledge of General Education information will improve Description: Including pain rating scale, medication(s)/side effects and non-pharmacologic comfort measures Outcome: Adequate for Discharge   Problem: Health Behavior/Discharge Planning: Goal: Ability to manage health-related needs will improve Outcome: Adequate for Discharge   Problem: Clinical Measurements: Goal: Ability to maintain clinical measurements within normal limits will improve Outcome: Adequate for Discharge Goal: Will remain free from infection Outcome: Adequate for Discharge Goal: Diagnostic test results will improve Outcome: Adequate for Discharge Goal: Respiratory complications will improve Outcome: Adequate for Discharge Goal: Cardiovascular complication will be avoided Outcome: Adequate for Discharge   Problem: Activity: Goal: Risk for activity intolerance will decrease Outcome: Adequate for Discharge   Problem: Nutrition: Goal: Adequate nutrition will be maintained Outcome: Adequate for Discharge   Problem: Coping: Goal: Level of anxiety will decrease Outcome: Adequate for Discharge   Problem: Elimination: Goal: Will not experience complications related to bowel motility Outcome: Adequate for Discharge Goal: Will not experience complications related to urinary retention Outcome: Adequate for Discharge   Problem: Pain Managment: Goal: General experience of comfort will improve Outcome: Adequate for Discharge   Problem: Safety: Goal: Ability to remain free from injury will improve Outcome: Adequate for Discharge   Problem: Skin Integrity: Goal: Risk for impaired skin integrity will decrease Outcome: Adequate for Discharge   Problem: Education: Goal: Knowledge of the prescribed therapeutic regimen will improve Outcome: Adequate for Discharge   Problem: Clinical Measurements: Goal: Usual level of consciousness will be regained or maintained. Outcome: Adequate  for Discharge Goal: Neurologic status will improve Outcome: Adequate for Discharge Goal: Ability to maintain intracranial pressure will improve Outcome: Adequate for Discharge   Problem: Skin Integrity: Goal: Demonstration of wound healing without infection will improve Outcome: Adequate for Discharge   

## 2020-09-30 NOTE — Progress Notes (Addendum)
Neurosurgery Service Progress Note  Subjective: No acute events overnight, no new weakness or numbness, improving scalp pain  Objective: Vitals:   09/29/20 2010 09/29/20 2342 09/30/20 0327 09/30/20 0802  BP: (!) 96/58 (!) 101/58 (!) 109/54 (!) 114/55  Pulse: (!) 58 (!) 109 94 81  Resp: 16 17 17 18   Temp: 99.1 F (37.3 C) 98.2 F (36.8 C) 99.2 F (37.3 C) 98.4 F (36.9 C)  TempSrc: Oral Oral Oral Oral  SpO2: 96% 96% 96% 95%  Weight:      Height:        Physical Exam: AOx3, PERRL, EOMI, FS, TM, Strength 5/5 x4, SILTx4, no drift  Assessment & Plan: 50 y.o. woman s/p resection of large meningioma, recovering well. Post-op CTH w/wo with likely GTR, small SDH.  -discharge home today w/ HHOT -outpt cards f/u for PAF, not a candidate for anticoagulation at this time / immediately post-op and rate well controlled / asymptomatic  Judith Part  09/30/20 8:53 AM

## 2020-09-30 NOTE — Consult Note (Signed)
   [facility] Inpatient Consult   09/30/2020  Belinda Garrett 06/22/1970 [id number]   [place] [facility] [ACO] Patient: Belinda Garrett plan  Patient is assigned with a [place] [facility]  RN CM for hospital follow up for support in the health plan for resource and needs. Patient had already transitioned home today.   Plan: Patient will be followed by [place] Coordinator.Patient will receive a post hospital call and will be evaluated for assessments and disease process education.     For additional questions or referrals please contact:   Natividad Brood, RN BSN [facility] Liaison  [phone] business mobile phone Toll free office [phone]  Fax number: [phone] [email or web address]

## 2020-09-30 NOTE — TOC Transition Note (Signed)
Transition of Care [facility]) - CM/SW Discharge Note   Patient Details  Name: Belinda Garrett MRN: [id number] Date of Birth: 1970/04/13  [facility] CM/SW Contact:  Geralynn Ochs, LCSW Phone Number: 09/30/2020, 12:05 PM   Clinical Narrative:   Patient setup with [facility] for [facility], ordered bariatric walker through [employer] to be delivered to the room. No other needs at this time.    Final next level of care: [place] Barriers to Discharge: Barriers Resolved   Patient Goals and CMS Choice Patient states their goals for this hospitalization and ongoing recovery are:: to get home CMS Medicare.gov Compare Post Acute Care list provided to:: Patient Choice offered to / list presented to : Patient  Discharge Placement                Patient to be transferred to facility by: Family Name of family member notified: Self Patient and family notified of of transfer: 09/30/20  Discharge Plan and Services                DME Arranged: [place] Rile wide DME Agency: [employer] Date DME Agency Contacted: 09/30/20   Representative spoke with at DME Agency: Freda Munro HH Arranged: PT, OT [facility] Agency: [place] Date [place]: 09/30/20   Representative spoke with at [place]: [place] ([employer]) Interventions     Readmission Risk Interventions No flowsheet data found.

## 2020-09-30 NOTE — Progress Notes (Signed)
Occupational Therapy Treatment Patient Details Name: Belinda Garrett MRN: [id number] DOB: October 07, 1970 Today's Date: 09/30/2020    History of present illness Pt is a 50 y.o. female who presented 7/12 for elective surgical resection of large posterior left convexity intracranial meningioma. S/p L occipital crani and resection 7/12. PMH: anxiety, arthritis, depression, HTN, lymphedema, palpitations, peripheral neuropathy, pre-diabetes, rosacea, sleep apnea, and vertigo.   OT comments  Pt progressing well towards acute OT goals. Completed functional mobility and transfers at +1 min guard assist level. Cues for technique with rw. D/c plan remains appropriate.    Follow Up Recommendations  Home health OT;Supervision - Intermittent    Equipment Recommendations  None recommended by OT    Recommendations for Other Services      Precautions / Restrictions Precautions Precautions: Fall Restrictions Weight Bearing Restrictions: No       Mobility Bed Mobility Overal bed mobility: Needs Assistance Bed Mobility: Supine to Sit     Supine to sit: Min assist     General bed mobility comments: up in recliner    Transfers Overall transfer level: Needs assistance Equipment used: Rolling walker (2 wheeled) Transfers: Sit to/from Stand Sit to Stand: Min guard Stand pivot transfers: Min guard       General transfer comment: Cues for technique uncluding hand placement during transitional movements    Balance Overall balance assessment: Needs assistance Sitting-balance support: No upper extremity supported;Feet supported Sitting balance-Leahy Scale: Fair Sitting balance - Comments: Static sitting EOB with supervision.   Standing balance support: Single extremity supported;During functional activity;Bilateral upper extremity supported Standing balance-Leahy Scale: Poor Standing balance comment: Reliant on at least 1 UE support.                           ADL either  performed or assessed with clinical judgement   ADL Overall ADL's : Needs assistance/impaired                         Toilet Transfer: Supervision/safety;Min guard;Ambulation;RW;Requires wide/bariatric Toilet Transfer Details (indicate cue type and reason): simulated with walking in the room and transfer to recliner           General ADL Comments: Pt fatigued quickly with activity. Balance improved from yesterday.     Vision       Perception     Praxis      Cognition Arousal/Alertness: Awake/alert Behavior During Therapy: WFL for tasks assessed/performed Overall Cognitive Status: Within Functional Limits for tasks assessed                                          Exercises     Shoulder Instructions       General Comments      Pertinent Vitals/ Pain       Pain Assessment: Faces Faces Pain Scale: Hurts a little bit Pain Location: head Pain Descriptors / Indicators: Discomfort Pain Intervention(s): Monitored during session  Home Living                                          Prior Functioning/Environment              Frequency  Min 2X/week  Progress Toward Goals  OT Goals(current goals can now be found in the care plan section)     Acute Rehab OT Goals Patient Stated Goal: to go home OT Goal Formulation: With patient Time For Goal Achievement: 10/13/20 Potential to Achieve Goals: Good ADL Goals Pt Will Perform Lower Body Bathing: sit to/from stand;with supervision;with adaptive equipment Pt Will Perform Lower Body Dressing: with supervision;sit to/from stand;with adaptive equipment Pt Will Transfer to Toilet: with supervision;ambulating;regular height toilet;grab bars Pt Will Perform Toileting - Clothing Manipulation and hygiene: with supervision;with adaptive equipment;sit to/from stand Pt Will Perform Tub/Shower Transfer: Shower transfer;with supervision;grab bars;rolling walker;tub bench   Plan Discharge plan remains appropriate    Co-evaluation                 AM-PAC OT "6 Clicks" Daily Activity     Outcome Measure   Help from another person eating meals?: None Help from another person taking care of personal grooming?: A Little Help from another person toileting, which includes using toliet, bedpan, or urinal?: A Little Help from another person bathing (including washing, rinsing, drying)?: A Little Help from another person to put on and taking off regular upper body clothing?: A Little Help from another person to put on and taking off regular lower body clothing?: A Little 6 Click Score: 19    End of Session Equipment Utilized During Treatment: Rolling walker  OT Visit Diagnosis: Unsteadiness on feet (R26.81)   Activity Tolerance Patient tolerated treatment well   Patient Left in chair;with call bell/phone within reach;with chair alarm set   Nurse Communication          Time: [phone] OT Time Calculation (min): 12 min  Charges: OT General Charges $OT Visit: 1 Visit OT Treatments $Self Care/Home Management : 8-22 mins  Tyrone Schimke, OT Acute Rehabilitation Services Pager: [phone] Office: [phone]    Hortencia Pilar 09/30/2020, 12:32 PM

## 2020-09-30 NOTE — Progress Notes (Signed)
Physical Therapy Treatment Patient Details Name: Belinda Garrett MRN: [id number] DOB: December 15, 1970 Today's Date: 09/30/2020    History of Present Illness Pt is a 50 y.o. female who presented 7/12 for elective surgical resection of large posterior left convexity intracranial meningioma. S/p L occipital crani and resection 7/12. PMH: anxiety, arthritis, depression, HTN, lymphedema, palpitations, peripheral neuropathy, pre-diabetes, rosacea, sleep apnea, and vertigo.    PT Comments    Pt is making good progress with mobility, ambulating with improved stability when using a RW vs her SPC. Thus, recommending pt d/c home with a bariatric RW. Pt was able to ambulate up to ~10 ft with the RW and negotiate x2 short stairs with bil hand rails with only min guard assist this date. This permits her to enter/exit her home and negotiate short distances in her home without physical assistance. Will continue to follow acutely. Current recommendations remain appropriate.   Follow Up Recommendations  Supervision for mobility/OOB;Home health PT     Equipment Recommendations  Rolling walker with 5" wheels (bariatric RW)    Recommendations for Other Services       Precautions / Restrictions Precautions Precautions: Fall Restrictions Weight Bearing Restrictions: No    Mobility  Bed Mobility Overal bed mobility: Needs Assistance Bed Mobility: Supine to Sit     Supine to sit: Min assist     General bed mobility comments: Bed flat, pt using L bed rail to simulate bed post she uses at home, minA to pull trunk and steady with transition to sit L EOB.    Transfers Overall transfer level: Needs assistance Equipment used: Straight cane;Rolling walker (2 wheeled) Transfers: Sit to/from [facility] Sit to Stand: Min guard Stand pivot transfers: Min guard       General transfer comment: Sit to stand 1x from EOB and 2x from recliner, min guard assist for safety. Cues for hand placement  when using RW.  Ambulation/Gait Ambulation/Gait assistance: Min guard;Min assist Gait Distance (Feet):  (x2 bouts of ~3 ft with SPC > ~10 ft with RW) Assistive device: Straight cane;Rolling walker (2 wheeled) Gait Pattern/deviations: Step-through pattern;Decreased stride length;Trunk flexed Gait velocity: reduced Gait velocity interpretation: <1.8 ft/sec, indicate of risk for recurrent falls General Gait Details: Pt taking fairly symmetrical bil step lengths but overall decreased stride length. Pt with bil knee flexion in standing, reporting this is her baseline. MinA for steadying and safety using SPC but min guard using RW. No LOB.   Stairs Stairs: Yes Stairs assistance: Min guard Stair Management: Two rails;Step to pattern;Forwards Number of Stairs: 2 (short steps) General stair comments: Ascends and descends x2 short steps with bil handrails to simulate door frame she uses to support self with x1 short stair to enter/exit home, no LOB, min guard for safety.   Wheelchair Mobility    Modified Rankin (Stroke Patients Only) Modified Rankin (Stroke Patients Only) Pre-Morbid Rankin Score: Slight disability Modified Rankin: Moderately severe disability     Balance Overall balance assessment: Needs assistance Sitting-balance support: No upper extremity supported;Feet supported Sitting balance-Leahy Scale: Fair Sitting balance - Comments: Static sitting EOB with supervision.   Standing balance support: Single extremity supported;During functional activity;Bilateral upper extremity supported Standing balance-Leahy Scale: Poor Standing balance comment: Reliant on at least 1 UE support.                            Cognition Arousal/Alertness: Awake/alert Behavior During Therapy: WFL for tasks assessed/performed Overall Cognitive Status: Within  Functional Limits for tasks assessed                                        Exercises      General  Comments        Pertinent Vitals/Pain Pain Assessment: Faces Faces Pain Scale: No hurt Pain Location: head Pain Descriptors / Indicators: Discomfort Pain Intervention(s): Monitored during session    Home Living                      Prior Function            PT Goals (current goals can now be found in the care plan section) Acute Rehab PT Goals Patient Stated Goal: to go home PT Goal Formulation: With patient Time For Goal Achievement: 10/13/20 Potential to Achieve Goals: Good Progress towards PT goals: Progressing toward goals    Frequency    Min 3X/week      PT Plan Current plan remains appropriate;Equipment recommendations need to be updated    Co-evaluation              AM-PAC PT "6 Clicks" Mobility   Outcome Measure  Help needed turning from your back to your side while in a flat bed without using bedrails?: A Little Help needed moving from lying on your back to sitting on the side of a flat bed without using bedrails?: A Little Help needed moving to and from a bed to a chair (including a wheelchair)?: A Little Help needed standing up from a chair using your arms (e.g., wheelchair or bedside chair)?: A Little Help needed to walk in hospital room?: A Little Help needed climbing 3-5 steps with a railing? : A Little 6 Click Score: 18    End of Session Equipment Utilized During Treatment: Gait belt Activity Tolerance: Patient tolerated treatment well Patient left: in chair;with call bell/phone within reach;with chair alarm set Nurse Communication: Mobility status PT Visit Diagnosis: Unsteadiness on feet (R26.81);Other abnormalities of gait and mobility (R26.89);Muscle weakness (generalized) (M62.81);Difficulty in walking, not elsewhere classified (R26.2)     Time: [id number] PT Time Calculation (min) (ACUTE ONLY): 25 min  Charges:  $Gait Training: 23-37 mins                     Moishe Spice, PT, DPT Acute Rehabilitation Services  Pager:  [phone] Office: [place] 09/30/2020, 11:39 AM

## 2020-10-01 ENCOUNTER — Telehealth: Payer: Self-pay

## 2020-10-01 ENCOUNTER — Other Ambulatory Visit: Payer: Self-pay | Admitting: *Deleted

## 2020-10-01 ENCOUNTER — Encounter: Payer: Self-pay | Admitting: *Deleted

## 2020-10-01 NOTE — Patient Outreach (Signed)
[facility]) Care Management  10/01/2020  ARES TEGTMEYER 08-Jul-1970 [id number]   Transition of care call/case closure   Referral received:09/28/20 Initial outreach:10/01/20 Insurance: [place] [employer]    Subjective: Initial successful telephone call to patient's preferred number in order to complete transition of care assessment; 2 HIPAA identifiers verified. Explained purpose of call and completed transition of care assessment.  Angie states she is doing good amazed that she has surgery and is doing so well. She denies post-operative problems, says surgical incisions are unremarkable, states surgical pain well managed with prescribed medications. She discussed tolerating mobility in the home using her rolling walker , able to complete shower on yesterday. She is tolerating diet, denies bowel or bladder problems.  Patient discussed planned follow up with cardiology for identified episode of atrial fib during surgery, she denies any signs symptoms of shortness of breath, fast heart beat. Patient mother and brother are available to assist in her recovery.   Reviewed accessing the following [place] Benefits : She discussed any ongoing health issues, prediabetes, weight and has been participating in identified action steps . She is unsure if she has the  hospital indemnity plan provided contact number for [employer] to file a claim if needed.She discussed use of EACP also.  She uses a  [facility] outpatient pharmacy [facility] employee pharmacy.      Objective:  Juanna Pudlo was hospitalized at [facility] 7/12-09/1420 for Craniotomy for resection fo large posterior midline meningioma . Comorbidities include: Prediabetes, obesity She was discharged to home on 09/30/20  with [facility] for home health therapy and DME of Bariatric rolling walker.    Assessment:  Patient voices good understanding of all discharge instructions.  See transition of care flowsheet for assessment details.    Plan:  Reviewed hospital discharge diagnosis of Resection of meningioma  and discharge treatment plan using hospital discharge instructions, assessing medication adherence, reviewing problems requiring provider notification, and discussing the importance of follow up with surgeon, primary care provider and/or specialists as directed. Verified patient has contact number for [facility] home health and to contact if no response in next 48 hours, She verifies having contact number for [facility]  cardiology [place] awaiting call for referral.  Reinforced worsening symptoms to notify MD , signs symptoms of infection, dizziness, pain, concerns related to atrial fib.  Patient denies additional resources needs or follow up.   Reviewed [employer] healthy lifestyle program information to receive discounted premium for  2023   Step 1: Get  your annual physical  Step 2: Complete your health assessment  Step 3:Identify your current health status and complete the corresponding action step between March 20, 2020 and November 18, 2020.    Using [facility] website, verified that patient is an active participate in [facility] Active Health Management chronic disease management program.    No ongoing care management needs identified so will close case to [facility] Management services and route successful outreach letter with [facility] pamphlet and 24 Hour Nurse Line Magnet to [facility] clinical pool to be mailed to patient's home address.  Thanked patient for their services to [facility].  Joylene Draft, RN, BSN  [facility] [place] Management Coordinator  [phone]- Mobile [phone]- Toll Free Main Office

## 2020-10-01 NOTE — Telephone Encounter (Cosign Needed)
Transition Care Management Follow-up Telephone Call Date of discharge and from where: [facility] 09/30/20 How have you been since you were released from the hospital? good Any questions or concerns? No  Items Reviewed: Did the pt receive and understand the discharge instructions provided? Yes  Medications obtained and verified? Yes  Other? No  Any new allergies since your discharge? No  Dietary orders reviewed? Yes Do you have support at home? Yes   Home Care and Equipment/Supplies: Were home health services ordered? yes If so, what is the name of the agency? [facility]  Has the agency set up a time to come to the patient's home? yes Were any new equipment or medical supplies ordered?   What is the name of the medical supply agency?  Were you able to get the supplies/equipment? not applicable Do you have any questions related to the use of the equipment or supplies? No  Functional Questionnaire: (I = Independent and D = Dependent) ADLs: I  Bathing/Dressing- I  Meal Prep- I  Eating- I  Maintaining continence- I  Transferring/Ambulation- I  Managing Meds- I  Follow up appointments reviewed:  PCP Hospital f/u appt confirmed? No   Specialist Hospital f/u appt confirmed? No  Pt stated she will follow up with Cardiology  Are transportation arrangements needed? No  If their condition worsens, is the pt aware to call PCP or go to the Emergency Dept.? Yes Was the patient provided with contact information for the PCP's office or ED? Yes Was to pt encouraged to call back with questions or concerns? Yes

## 2020-10-02 ENCOUNTER — Encounter: Payer: Self-pay | Admitting: Internal Medicine

## 2020-10-04 DIAGNOSIS — I10 Essential (primary) hypertension: Secondary | ICD-10-CM | POA: Diagnosis not present

## 2020-10-04 DIAGNOSIS — Z483 Aftercare following surgery for neoplasm: Secondary | ICD-10-CM | POA: Diagnosis not present

## 2020-10-04 DIAGNOSIS — D320 Benign neoplasm of cerebral meninges: Secondary | ICD-10-CM | POA: Diagnosis not present

## 2020-10-04 DIAGNOSIS — I4891 Unspecified atrial fibrillation: Secondary | ICD-10-CM | POA: Diagnosis not present

## 2020-10-04 DIAGNOSIS — E1151 Type 2 diabetes mellitus with diabetic peripheral angiopathy without gangrene: Secondary | ICD-10-CM | POA: Diagnosis not present

## 2020-10-04 NOTE — Telephone Encounter (Signed)
Please advise on new patient

## 2020-10-06 ENCOUNTER — Other Ambulatory Visit: Payer: Self-pay

## 2020-10-06 ENCOUNTER — Other Ambulatory Visit: Payer: Self-pay | Admitting: Radiation Therapy

## 2020-10-06 DIAGNOSIS — I4891 Unspecified atrial fibrillation: Secondary | ICD-10-CM | POA: Diagnosis not present

## 2020-10-06 DIAGNOSIS — Z483 Aftercare following surgery for neoplasm: Secondary | ICD-10-CM | POA: Diagnosis not present

## 2020-10-06 DIAGNOSIS — E1151 Type 2 diabetes mellitus with diabetic peripheral angiopathy without gangrene: Secondary | ICD-10-CM | POA: Diagnosis not present

## 2020-10-06 DIAGNOSIS — I10 Essential (primary) hypertension: Secondary | ICD-10-CM | POA: Diagnosis not present

## 2020-10-06 DIAGNOSIS — D320 Benign neoplasm of cerebral meninges: Secondary | ICD-10-CM | POA: Diagnosis not present

## 2020-10-08 DIAGNOSIS — D320 Benign neoplasm of cerebral meninges: Secondary | ICD-10-CM | POA: Diagnosis not present

## 2020-10-08 DIAGNOSIS — I10 Essential (primary) hypertension: Secondary | ICD-10-CM | POA: Diagnosis not present

## 2020-10-08 DIAGNOSIS — E1151 Type 2 diabetes mellitus with diabetic peripheral angiopathy without gangrene: Secondary | ICD-10-CM | POA: Diagnosis not present

## 2020-10-08 DIAGNOSIS — I4891 Unspecified atrial fibrillation: Secondary | ICD-10-CM | POA: Diagnosis not present

## 2020-10-08 DIAGNOSIS — Z483 Aftercare following surgery for neoplasm: Secondary | ICD-10-CM | POA: Diagnosis not present

## 2020-10-11 ENCOUNTER — Inpatient Hospital Stay: Payer: 59 | Attending: Neurological Surgery

## 2020-10-12 DIAGNOSIS — E1151 Type 2 diabetes mellitus with diabetic peripheral angiopathy without gangrene: Secondary | ICD-10-CM | POA: Diagnosis not present

## 2020-10-12 DIAGNOSIS — I10 Essential (primary) hypertension: Secondary | ICD-10-CM | POA: Diagnosis not present

## 2020-10-12 DIAGNOSIS — Z483 Aftercare following surgery for neoplasm: Secondary | ICD-10-CM | POA: Diagnosis not present

## 2020-10-12 DIAGNOSIS — D320 Benign neoplasm of cerebral meninges: Secondary | ICD-10-CM | POA: Diagnosis not present

## 2020-10-12 DIAGNOSIS — I4891 Unspecified atrial fibrillation: Secondary | ICD-10-CM | POA: Diagnosis not present

## 2020-10-13 ENCOUNTER — Other Ambulatory Visit: Payer: Self-pay | Admitting: Neurological Surgery

## 2020-10-13 ENCOUNTER — Other Ambulatory Visit: Payer: Self-pay

## 2020-10-13 ENCOUNTER — Other Ambulatory Visit (HOSPITAL_COMMUNITY): Payer: Self-pay | Admitting: Neurological Surgery

## 2020-10-13 DIAGNOSIS — D496 Neoplasm of unspecified behavior of brain: Secondary | ICD-10-CM

## 2020-10-14 ENCOUNTER — Other Ambulatory Visit: Payer: Self-pay

## 2020-10-14 DIAGNOSIS — E1151 Type 2 diabetes mellitus with diabetic peripheral angiopathy without gangrene: Secondary | ICD-10-CM | POA: Diagnosis not present

## 2020-10-14 DIAGNOSIS — I4891 Unspecified atrial fibrillation: Secondary | ICD-10-CM | POA: Diagnosis not present

## 2020-10-14 DIAGNOSIS — G608 Other hereditary and idiopathic neuropathies: Secondary | ICD-10-CM | POA: Diagnosis not present

## 2020-10-14 DIAGNOSIS — D320 Benign neoplasm of cerebral meninges: Secondary | ICD-10-CM | POA: Diagnosis not present

## 2020-10-14 DIAGNOSIS — Z483 Aftercare following surgery for neoplasm: Secondary | ICD-10-CM | POA: Diagnosis not present

## 2020-10-14 DIAGNOSIS — I10 Essential (primary) hypertension: Secondary | ICD-10-CM | POA: Diagnosis not present

## 2020-10-18 ENCOUNTER — Other Ambulatory Visit (HOSPITAL_COMMUNITY): Payer: Self-pay

## 2020-10-18 DIAGNOSIS — E1151 Type 2 diabetes mellitus with diabetic peripheral angiopathy without gangrene: Secondary | ICD-10-CM | POA: Diagnosis not present

## 2020-10-18 DIAGNOSIS — D320 Benign neoplasm of cerebral meninges: Secondary | ICD-10-CM | POA: Diagnosis not present

## 2020-10-18 DIAGNOSIS — I10 Essential (primary) hypertension: Secondary | ICD-10-CM | POA: Diagnosis not present

## 2020-10-18 DIAGNOSIS — Z483 Aftercare following surgery for neoplasm: Secondary | ICD-10-CM | POA: Diagnosis not present

## 2020-10-18 DIAGNOSIS — I4891 Unspecified atrial fibrillation: Secondary | ICD-10-CM | POA: Diagnosis not present

## 2020-10-18 MED FILL — Hydrochlorothiazide Tab 25 MG: ORAL | 90 days supply | Qty: 90 | Fill #0 | Status: AC

## 2020-10-19 DIAGNOSIS — Z483 Aftercare following surgery for neoplasm: Secondary | ICD-10-CM | POA: Diagnosis not present

## 2020-10-19 DIAGNOSIS — I10 Essential (primary) hypertension: Secondary | ICD-10-CM | POA: Diagnosis not present

## 2020-10-19 DIAGNOSIS — I4891 Unspecified atrial fibrillation: Secondary | ICD-10-CM | POA: Diagnosis not present

## 2020-10-19 DIAGNOSIS — E1151 Type 2 diabetes mellitus with diabetic peripheral angiopathy without gangrene: Secondary | ICD-10-CM | POA: Diagnosis not present

## 2020-10-19 DIAGNOSIS — D320 Benign neoplasm of cerebral meninges: Secondary | ICD-10-CM | POA: Diagnosis not present

## 2020-10-21 DIAGNOSIS — E1151 Type 2 diabetes mellitus with diabetic peripheral angiopathy without gangrene: Secondary | ICD-10-CM | POA: Diagnosis not present

## 2020-10-21 DIAGNOSIS — I10 Essential (primary) hypertension: Secondary | ICD-10-CM | POA: Diagnosis not present

## 2020-10-21 DIAGNOSIS — D320 Benign neoplasm of cerebral meninges: Secondary | ICD-10-CM | POA: Diagnosis not present

## 2020-10-21 DIAGNOSIS — I4891 Unspecified atrial fibrillation: Secondary | ICD-10-CM | POA: Diagnosis not present

## 2020-10-21 DIAGNOSIS — Z483 Aftercare following surgery for neoplasm: Secondary | ICD-10-CM | POA: Diagnosis not present

## 2020-10-25 NOTE — Progress Notes (Unsigned)
Vanlafazine [id number].'5mg'$  sent to plan [employer] health plan, fax form, sent to scan.

## 2020-10-28 ENCOUNTER — Other Ambulatory Visit: Payer: Self-pay

## 2020-10-28 ENCOUNTER — Other Ambulatory Visit (HOSPITAL_COMMUNITY): Payer: Self-pay

## 2020-10-28 MED ORDER — GABAPENTIN 100 MG PO CAPS
100.0000 mg | ORAL_CAPSULE | Freq: Every day | ORAL | 1 refills | Status: DC
  Filled 2020-10-28 – 2020-12-01 (×4): qty 90, 90d supply, fill #0

## 2020-10-28 MED ORDER — GABAPENTIN 300 MG PO CAPS
ORAL_CAPSULE | ORAL | 3 refills | Status: DC
  Filled 2020-10-28 – 2020-12-01 (×2): qty 90, 90d supply, fill #0

## 2020-11-01 ENCOUNTER — Other Ambulatory Visit (HOSPITAL_COMMUNITY): Payer: Self-pay

## 2020-11-02 ENCOUNTER — Other Ambulatory Visit: Payer: Self-pay

## 2020-11-02 ENCOUNTER — Ambulatory Visit
Admission: RE | Admit: 2020-11-02 | Discharge: 2020-11-02 | Disposition: A | Discharge status: Home / Self Care | Payer: 59 | Source: Ambulatory Visit | Attending: Neurological Surgery | Admitting: Neurological Surgery

## 2020-11-02 DIAGNOSIS — C719 Malignant neoplasm of brain, unspecified: Secondary | ICD-10-CM | POA: Diagnosis not present

## 2020-11-02 DIAGNOSIS — D496 Neoplasm of unspecified behavior of brain: Secondary | ICD-10-CM

## 2020-11-02 MED ORDER — GADOBENATE DIMEGLUMINE 529 MG/ML IV SOLN
20.0000 mL | Freq: Once | INTRAVENOUS | Status: AC | PRN
  Administered 2020-11-02: 20 mL via INTRAVENOUS

## 2020-11-03 ENCOUNTER — Encounter: Payer: Self-pay | Admitting: Cardiology

## 2020-11-03 ENCOUNTER — Ambulatory Visit (INDEPENDENT_AMBULATORY_CARE_PROVIDER_SITE_OTHER): Payer: 59 | Admitting: Cardiology

## 2020-11-03 ENCOUNTER — Other Ambulatory Visit (HOSPITAL_COMMUNITY): Payer: Self-pay

## 2020-11-03 VITALS — BP 107/68 | HR 103 | Ht 62.0 in | Wt >= 6400 oz

## 2020-11-03 DIAGNOSIS — I4819 Other persistent atrial fibrillation: Secondary | ICD-10-CM

## 2020-11-03 DIAGNOSIS — D496 Neoplasm of unspecified behavior of brain: Secondary | ICD-10-CM

## 2020-11-03 DIAGNOSIS — I10 Essential (primary) hypertension: Secondary | ICD-10-CM

## 2020-11-03 MED ORDER — METOPROLOL SUCCINATE ER 25 MG PO TB24: 12.5000 mg | ORAL_TABLET | Freq: Every day | ORAL | 3 refills | Status: DC

## 2020-11-03 MED ORDER — METOPROLOL SUCCINATE ER 25 MG PO TB24
12.5000 mg | ORAL_TABLET | Freq: Every day | ORAL | 3 refills | Status: DC
  Filled 2020-11-03: qty 45, 90d supply, fill #0
  Filled 2021-01-25: qty 45, 90d supply, fill #1
  Filled 2021-03-07 – 2021-04-19 (×2): qty 45, 90d supply, fill #2
  Filled 2021-07-17: qty 45, 90d supply, fill #3

## 2020-11-03 NOTE — Progress Notes (Signed)
Electrophysiology Office Note:    Date:  11/03/2020   ID:  Belinda Garrett, DOB Nov 01, 1970, MRN [id number]  PCP:  McLean-Scocuzza, Nino Glow, MD  [facility] Cardiologist:  None  [facility] Electrophysiologist:  Vickie Epley, MD   Referring MD: Judith Part, MD   Chief Complaint: Atrial fibrillation  History of Present Illness:    Belinda Garrett is a 50 y.o. female who presents for an evaluation of atrial fibrillation at the request of Dr. Venetia Constable. Their medical history includes morbid obesity, hypertension, prediabetes, meningioma status post surgical resection in July 2022.  In the perioperative period she experienced atrial fibrillation for which she is referred.  She is currently not on anticoagulation because of recent meningioma resection.  She tells me that she is completely asymptomatic with her atrial fibrillation.  She does not feel palpitations.  No lightheadedness or dizziness.  No syncope.  She tells me that her recovery after her brain tumor resection is doing remarkably well.  Past Medical History:  Diagnosis Date   Abnormal menses    Allergy    Anxiety    Arthritis    Back pain    Back pain    Chicken pox    Depression    Hypertension    Joint pain    Joint pain    Lymphedema    Palpitations    Peripheral neuropathy 09/30/2018   Pre-diabetes    Prediabetes    Rosacea    Seasonal allergies    Sleep apnea    Stasis dermatitis of both legs    Vertigo     Past Surgical History:  Procedure Laterality Date   APPLICATION OF CRANIAL NAVIGATION N/A 09/28/2020   Procedure: APPLICATION OF CRANIAL NAVIGATION;  Surgeon: Judith Part, MD;  Location: [place];  Service: Neurosurgery;  Laterality: N/A;   CRANIOTOMY Left 09/28/2020   Procedure: Left Occipital craniotomy for tumor resection;  Surgeon: Judith Part, MD;  Location: [place];  Service: Neurosurgery;  Laterality: Left;  RM 20   WISDOM TOOTH EXTRACTION      Current  Medications: Current Meds  Medication Sig   Alpha-Lipoic Acid 600 MG TABS Take 600 mg by mouth daily.   B Complex Vitamins (VITAMIN B COMPLEX PO) Take 1 capsule by mouth daily as needed (energy boost).   buPROPion (WELLBUTRIN SR) 150 MG 12 hr tablet TAKE 1 TABLET BY MOUTH TWICE DAILY   Cholecalciferol (VITAMIN D3) 50 MCG (2000 UT) capsule Take 2,000 Units by mouth daily.   FLUoxetine (PROZAC) 40 MG capsule Take 1 capsule (40 mg total) by mouth daily.   gabapentin (NEURONTIN) 100 MG capsule Take 1 capsule by mouth at bedtime   gabapentin (NEURONTIN) 300 MG capsule Take 1 capsule (300 mg total) by mouth at bedtime   glucosamine-chondroitin 500-400 MG tablet Take 1 tablet by mouth daily.   hydrochlorothiazide (HYDRODIURIL) 25 MG tablet TAKE 1 TABLET BY MOUTH DAILY IN MORNING   HYDROcodone-acetaminophen (NORCO/VICODIN) 5-325 MG tablet Take 1 tablet by mouth every 4 (four) hours as needed for moderate pain.   lisinopril (ZESTRIL) 30 MG tablet TAKE 1 TABLET (30 MG TOTAL) BY MOUTH DAILY.   loratadine (CLARITIN) 10 MG tablet Take 10 mg by mouth daily.   medroxyPROGESTERone (PROVERA) 2.5 MG tablet TAKE 1 TABLET BY MOUTH DAILY   metFORMIN (GLUCOPHAGE) 500 MG tablet TAKE 1 TABLET (500 MG TOTAL) BY MOUTH DAILY WITH BREAKFAST.   metoprolol succinate (TOPROL XL) 25 MG 24 hr tablet Take 0.5  tablets (12.5 mg total) by mouth daily.   metroNIDAZOLE (METROGEL) 1 % gel Apply to face daily   Multiple Vitamin (MULTIVITAMIN) capsule Take 1 capsule by mouth daily.   Omega-3 Fatty Acids (FISH OIL) 1000 MG CAPS Take 1,000 mg by mouth daily.   [DISCONTINUED] gabapentin (NEURONTIN) 300 MG capsule TAKE 1 CAPSULE (300 MG TOTAL) BY MOUTH AT BEDTIME.     Allergies:   Dust mite extract, Latex, and Pollen extract   Social History   Socioeconomic History   Marital status: Single    Spouse name: Not on file   Number of children: Not on file   Years of education: Not on file   Highest education level: Not on file   Occupational History   Occupation: work from home, Designer, jewellery - [place]  Tobacco Use   Smoking status: Never   Smokeless tobacco: Never  Vaping Use   Vaping Use: Never used  Substance and Sexual Activity   Alcohol use: Not Currently    Alcohol/week: 0.0 standard drinks   Drug use: Not Currently   Sexual activity: Never  Other Topics Concern   Not on file  Social History Narrative   Works for [employer] from home    No kids    No guns    Wears seat belt    Safe in relationship    Social Determinants of Radio broadcast assistant Strain: Not on file  Food Insecurity: Not on file  Transportation Needs: Not on file  Physical Activity: Not on file  Stress: Not on file  Social Connections: Not on file     Family History: The patient's family history includes Alcohol abuse in her maternal grandfather; Arthritis in her maternal grandfather, maternal grandmother, mother, paternal grandfather, and paternal grandmother; Dementia in her father; Depression in her father, maternal grandfather, mother, and paternal grandfather; Hearing loss in her maternal grandfather; Heart disease in her maternal grandfather, maternal grandmother, and paternal grandfather; Hyperlipidemia in her maternal grandmother; Hypertension in her maternal grandmother, mother, and paternal grandfather; Obesity in her father and mother; Parkinson's disease in her father; Stroke in her maternal grandfather, maternal grandmother, and paternal grandfather; Thyroid disease in her mother. There is no history of Breast cancer.  ROS:   Please see the history of present illness.    All other systems reviewed and are negative.  EKGs/Labs/Other Studies Reviewed:    The following studies were reviewed today: Prior notes  EKG:  The ekg ordered today demonstrates atrial fibrillation with ventricular rates of 105 bpm.  Recent Labs: 04/29/2020: ALT 29 09/15/2020: BUN 11 09/28/2020: Creatinine, Ser 1.00; Hemoglobin  9.5; Platelets 215; Potassium 4.1; Sodium 141  Recent Lipid Panel    Component Value Date/Time   CHOL 180 04/29/2020 1015   TRIG 107 04/29/2020 1015   HDL 38 (L) 04/29/2020 1015   CHOLHDL 5 06/04/2019 0803   VLDL 22.2 06/04/2019 0803   LDLCALC 122 (H) 04/29/2020 1015    Physical Exam:    VS:  BP 107/68   Pulse (!) 103   Ht '5\' 2"'$  (1.575 m)   Wt (!) 459 lb 3.2 oz (208.3 kg)   SpO2 98%   BMI 83.99 kg/m     Wt Readings from Last 3 Encounters:  11/03/20 (!) 459 lb 3.2 oz (208.3 kg)  09/28/20 (!) 456 lb (206.8 kg)  09/15/20 (!) 454 lb (205.9 kg)     GEN:  Well nourished, well developed in no acute distress.  Morbidly obese HEENT:  Normal NECK: No JVD; No carotid bruits LYMPHATICS: No lymphadenopathy CARDIAC: Tachycardic, irregularly irregular , no murmurs, rubs, gallops RESPIRATORY:  Clear to auscultation without rales, wheezing or rhonchi  ABDOMEN: Soft, non-tender, non-distended MUSCULOSKELETAL:  No edema; No deformity  SKIN: Warm and dry NEUROLOGIC:  Alert and oriented x 3 PSYCHIATRIC:  Normal affect   ASSESSMENT:    1. Persistent atrial fibrillation ([place])   2. Brain tumor ([place])   3. Primary hypertension   4. Morbid obesity ([place])    PLAN:    In order of problems listed above:  1. Persistent atrial fibrillation ([place]) Patient in rapidly conducted atrial fibrillation with ventricular rates over 100 bpm at rest.  She is relatively asymptomatic.  She is not currently anticoagulated because of recent brain surgery.  Ideally would try to restore normal rhythm given her young age but this would require at least 7 weeks of anticoagulation.  There is no emergent need to pursue cardioversion given she is asymptomatic and hemodynamically stable.  I will send a message to her neurosurgeon to discuss when they think anticoagulation can be safely used postoperatively.  In the meantime, we will plan to start metoprolol succinate 12.5 mg by mouth once daily to see if we get her  ventricular rates slowed.  Ideally would have her resting ventricular rates less than 90 bpm.  2. Brain tumor ([place]) Doing well postop.  3. Primary hypertension Controlled.  Continue current regimen.  4. Morbid obesity ([place]) We discussed the link between atrial fibrillation and her weight during today's visit.  Weight loss encouraged.   I will plan to see her back in 6 months.  Or sooner as needed.     Medication Adjustments/Labs and Tests Ordered: Current medicines are reviewed at length with the patient today.  Concerns regarding medicines are outlined above.  Orders Placed This Encounter  Procedures   EKG 12-Lead   Meds ordered this encounter  Medications   metoprolol succinate (TOPROL XL) 25 MG 24 hr tablet    Sig: Take 0.5 tablets (12.5 mg total) by mouth daily.    Dispense:  45 tablet    Refill:  3   metoprolol succinate (TOPROL XL) 25 MG 24 hr tablet    Sig: Take 0.5 tablets (12.5 mg total) by mouth daily.    Dispense:  45 tablet    Refill:  3     Signed, Maclovio Henson T. Quentin Ore, MD, [facility], [facility] 11/03/2020 4:43 PM    Electrophysiology [facility]

## 2020-11-03 NOTE — Patient Instructions (Addendum)
Medication Instructions:  Your physician has recommended you make the following change in your medication:    START taking metoprolol succinate 25 mg-  Take 1/2 tablet (12.5 mg) by mouth once a day  *If you need a refill on your cardiac medications before your next appointment, please call your pharmacy*  Lab Work: None ordered. If you have labs (blood work) drawn today and your tests are completely normal, you will receive your results only by: [place] (if you have MyChart) OR A paper copy in the mail If you have any lab test that is abnormal or we need to change your treatment, we will call you to review the results.  Testing/Procedures: None ordered.  Follow-Up: At [facility], you and your health needs are our priority.  As part of our continuing mission to provide you with exceptional heart care, we have created designated Provider Care Teams.  These Care Teams include your primary Cardiologist (physician) and Advanced Practice Providers (APPs -  Physician Assistants and Nurse Practitioners) who all work together to provide you with the care you need, when you need it.  Your next appointment:   Your physician wants you to follow-up in: 6 months with Dr. Quentin Ore   April 27, 2020 at 8;00 am at the [facility] office  Metoprolol Extended-Release Capsules What is this medication? METOPROLOL (me TOE proe lole) treats high blood pressure and heart failure. It may also be used to prevent chest pain (angina). It works by lowering your blood pressure and heart rate, making it easier for your heart to pump blood to the rest of your body. It belongs to a group of medications called betablockers. This medicine may be used for other purposes; ask your health care provider orpharmacist if you have questions. COMMON BRAND NAME(S): [facility] What should I tell my care team before I take this medication? They need to know if you have any of these conditions: Diabetes Heart  disease Liver disease Lung or breathing disease, like asthma Pheochromocytoma Thyroid disease An unusual or allergic reaction to metoprolol, other beta blockers, drugs, foods, dyes, or preservatives Pregnant or trying to get pregnant Breast-feeding How should I use this medication? Take this medication by mouth with water. Take it as directed on the prescription label at the same time every day. Do not cut, crush, or chew this medication. Swallow the capsules whole. You may open the capsule and put the contents in 1 teaspoon of applesauce. Swallow the medication and applesauce right away. Do not chew the medication or applesauce. Keep taking it unlessyour care team tells you to stop. Talk to your care team about the use of this medication in children. While it may be prescribed for children as young as 6 years for selected conditions,precautions do apply. Overdosage: If you think you have taken too much of this medicine contact apoison control center or emergency room at once. NOTE: This medicine is only for you. Do not share this medicine with others. What if I miss a dose? If you miss a dose, take it as soon as you can. If it is almost time for yournext dose, take only that dose. Do not take double or extra doses. What may interact with this medication? This medication may interact with the following: Certain medications for blood pressure, heart disease, irregular heartbeat Epinephrine Fluoxetine MAOIs like Carbex, Eldepryl, Marplan, Nardil, and Parnate Paroxetine Reserpine This list may not describe all possible interactions. Give your health care provider a list of all the medicines, herbs,  non-prescription drugs, or dietary supplements you use. Also tell them if you smoke, drink alcohol, or use illegaldrugs. Some items may interact with your medicine. What should I watch for while using this medication? Visit your care team for regular checks on your progress. Check your blood pressure  as directed. Ask your care team what your blood pressure should be.Also, find out when you should contact them. Do not treat yourself for coughs, colds, or pain while you are using this medication without asking your care team for advice. Some medications mayincrease your blood pressure. You may get drowsy or dizzy. Do not drive, use machinery, or do anything that needs mental alertness until you know how this medication affects you. Do not stand up or sit up quickly, especially if you are an older patient. This reduces the risk of dizzy or fainting spells. Alcohol may interfere with theeffect of this medication. Avoid alcoholic drinks. This medication may increase blood sugar. Ask your care team if changes in dietor medications are needed if you have diabetes. What side effects may I notice from receiving this medication? Side effects that you should report to your care team as soon as possible: Allergic reactions-skin rash, itching, hives, swelling of the face, lips, tongue, or throat Heart failure-shortness of breath, swelling of the ankles, feet, or hands, sudden weight gain, unusual weakness or fatigue Low blood pressure-dizziness, feeling faint or lightheaded, blurry vision Raynaud's-cool, numb, or painful fingers or toes that may change color from pale, to blue, to red Slow heartbeat-dizziness, feeling faint or lightheaded, confusion, trouble breathing, unusual weakness or fatigue Worsening mood, feelings of depression Side effects that usually do not require medical attention (report to your careteam if they continue or are bothersome): Change in sex drive or performance Diarrhea Dizziness Fatigue Headache This list may not describe all possible side effects. Call your doctor for medical advice about side effects. You may report side effects to FDA at1-800-FDA-1088. Where should I keep my medication? Keep out of the reach of children and pets. Store at room temperature between 20 and 25  degrees C (68 and 77 degrees F).Throw away any unused medication after the expiration date. NOTE: This sheet is a summary. It may not cover all possible information. If you have questions about this medicine, talk to your doctor, pharmacist, orhealth care provider.  2022 Elsevier/Gold Standard (2020-04-08 12:41:51)

## 2020-11-04 ENCOUNTER — Other Ambulatory Visit (HOSPITAL_COMMUNITY): Payer: Self-pay

## 2020-11-04 MED FILL — Metformin HCl Tab 500 MG: ORAL | 90 days supply | Qty: 90 | Fill #1 | Status: AC

## 2020-12-01 ENCOUNTER — Other Ambulatory Visit (HOSPITAL_COMMUNITY): Payer: Self-pay

## 2020-12-01 MED FILL — Medroxyprogesterone Acetate Tab 2.5 MG: ORAL | 90 days supply | Qty: 90 | Fill #2 | Status: AC

## 2020-12-01 MED FILL — Lisinopril Tab 30 MG: ORAL | 90 days supply | Qty: 90 | Fill #1 | Status: AC

## 2020-12-07 ENCOUNTER — Other Ambulatory Visit: Payer: Self-pay

## 2020-12-14 ENCOUNTER — Other Ambulatory Visit (HOSPITAL_COMMUNITY): Payer: Self-pay

## 2020-12-14 MED ORDER — GABAPENTIN 400 MG PO CAPS
ORAL_CAPSULE | ORAL | 3 refills | Status: DC
  Filled 2020-12-14: qty 90, 90d supply, fill #0
  Filled 2021-03-07: qty 90, 90d supply, fill #1
  Filled 2021-06-16: qty 90, 90d supply, fill #2
  Filled 2021-09-09: qty 90, 90d supply, fill #3

## 2020-12-15 ENCOUNTER — Other Ambulatory Visit (HOSPITAL_COMMUNITY): Payer: Self-pay

## 2020-12-21 NOTE — Progress Notes (Signed)
Virtual Visit via Telephone Note   This visit type was conducted due to national recommendations for restrictions regarding the COVID-19 Pandemic (e.g. social distancing) in an effort to limit this patient's exposure and mitigate transmission in our community.  Due to her co-morbid illnesses, this patient is at least at moderate risk for complications without adequate follow up.  This format is felt to be most appropriate for this patient at this time.  The patient did not have access to video technology/had technical difficulties with video requiring transitioning to audio format only (telephone).  All issues noted in this document were discussed and addressed.  No physical exam could be performed with this format.  Please refer to the patient's chart for her  consent to telehealth for [facility].    Date:  12/22/2020   ID:  [name], DOB 1970-04-24, MRN [id number] The patient was identified using 2 identifiers.  Patient Location: Home Provider Location: Office/Clinic   PCP:  McLean-Scocuzza, Nino Glow, MD   [facility] Providers Cardiologist:  None Electrophysiologist:  Vickie Epley, MD     Evaluation Performed:  Follow-Up Visit  Chief Complaint:  AF  History of Present Illness:     BRYTNEE BECHLER is a 50 y.o. female who presents for a follow up visit. They were last seen in clinic 11/03/2020 for her AF. At that appointment, she was in rapidly conducted AF. I started metoprolol and sent a message to her neurosurgeon (Dr Zada Finders) to discuss [facility]. Dr Zada Finders replied to my message after our last appointment and advised that it would be safe to start anticoagulation.  Today she tells me she is doing well.  Past Medical History:  Diagnosis Date   Abnormal menses    Allergy    Anxiety    Arthritis    Back pain    Back pain    Chicken pox    Depression    Hypertension    Joint pain    Joint pain    Lymphedema    Palpitations    Peripheral neuropathy  09/30/2018   Pre-diabetes    Prediabetes    Rosacea    Seasonal allergies    Sleep apnea    Stasis dermatitis of both legs    Vertigo    Past Surgical History:  Procedure Laterality Date   APPLICATION OF CRANIAL NAVIGATION N/A 09/28/2020   Procedure: APPLICATION OF CRANIAL NAVIGATION;  Surgeon: Judith Part, MD;  Location: [place];  Service: Neurosurgery;  Laterality: N/A;   CRANIOTOMY Left 09/28/2020   Procedure: Left Occipital craniotomy for tumor resection;  Surgeon: Judith Part, MD;  Location: [place];  Service: Neurosurgery;  Laterality: Left;  RM 20   WISDOM TOOTH EXTRACTION       No outpatient medications have been marked as taking for the 12/22/20 encounter (Video Visit) with Vickie Epley, MD.     Allergies:   Dust mite extract, Latex, and Pollen extract   Social History   Tobacco Use   Smoking status: Never   Smokeless tobacco: Never  Vaping Use   Vaping Use: Never used  Substance Use Topics   Alcohol use: Not Currently    Alcohol/week: 0.0 standard drinks   Drug use: Not Currently     Family Hx: The patient's family history includes Alcohol abuse in her maternal grandfather; Arthritis in her maternal grandfather, maternal grandmother, mother, paternal grandfather, and paternal grandmother; Dementia in her father; Depression in her father, maternal grandfather, mother,  and paternal grandfather; Hearing loss in her maternal grandfather; Heart disease in her maternal grandfather, maternal grandmother, and paternal grandfather; Hyperlipidemia in her maternal grandmother; Hypertension in her maternal grandmother, mother, and paternal grandfather; Obesity in her father and mother; Parkinson's disease in her father; Stroke in her maternal grandfather, maternal grandmother, and paternal grandfather; Thyroid disease in her mother. There is no history of Breast cancer.  ROS:   Please see the history of present illness.     All other systems reviewed and are  negative.   Prior CV studies:   The following studies were reviewed today:  Prior EKGs  Labs/Other Tests and Data Reviewed:    EKG:  No ECG reviewed.  Recent Labs: 04/29/2020: ALT 29 09/15/2020: BUN 11 09/28/2020: Creatinine, Ser 1.00; Hemoglobin 9.5; Platelets 215; Potassium 4.1; Sodium 141   Recent Lipid Panel Lab Results  Component Value Date/Time   CHOL 180 04/29/2020 10:15 AM   TRIG 107 04/29/2020 10:15 AM   HDL 38 (L) 04/29/2020 10:15 AM   CHOLHDL 5 06/04/2019 08:03 AM   LDLCALC 122 (H) 04/29/2020 10:15 AM    Wt Readings from Last 3 Encounters:  11/03/20 (!) 459 lb 3.2 oz (208.3 kg)  09/28/20 (!) 456 lb (206.8 kg)  09/15/20 (!) 454 lb (205.9 kg)         Objective:    Vital Signs:  There were no vitals taken for this visit.   VITAL SIGNS:  reviewed  ASSESSMENT & PLAN:    Persistent atrial fibrillation Historically had poorly controlled ventricular rates.  I did start her on Toprol-XL 12.5 mg by mouth once daily.  Her heart rates have trended downward but it is unclear to me whether or not she is back in normal rhythm.  She tells me her heart rate today is 82 bpm but I am unsure whether or not she is in sinus or rate controlled A. fib.  I will have her come in for a nurse visit with EKG.  If she is still in atrial fibrillation at that nurse visit, will start Eliquis 5 mg by mouth twice daily for 1 month.  We will do a cardioversion 1 month after starting the Eliquis.  She will continue Eliquis for at least 1 month after the cardioversion.  I will plan to see her a few weeks after her cardioversion.  If she presents for her nurse visit and is in normal sinus rhythm, I will plan to see her back in follow-up.  2.  History of brain tumor postresection Discussed her case with Dr. Venetia Constable who thought it would be safe to start blood thinners at this point after surgery.   Nurse visit in next 1 to 2 weeks.   Time:   Today, I have spent 8 minutes with the patient with  telehealth technology discussing the above problems.     Medication Adjustments/Labs and Tests Ordered: Current medicines are reviewed at length with the patient today.  Concerns regarding medicines are outlined above.   Tests Ordered: No orders of the defined types were placed in this encounter.   Medication Changes: No orders of the defined types were placed in this encounter.  Signed, Vickie Epley, MD  12/22/2020 11:40 AM    [place]

## 2020-12-22 ENCOUNTER — Telehealth (INDEPENDENT_AMBULATORY_CARE_PROVIDER_SITE_OTHER): Payer: 59 | Admitting: Cardiology

## 2020-12-22 ENCOUNTER — Other Ambulatory Visit: Payer: Self-pay

## 2020-12-22 DIAGNOSIS — I10 Essential (primary) hypertension: Secondary | ICD-10-CM | POA: Diagnosis not present

## 2020-12-22 DIAGNOSIS — D496 Neoplasm of unspecified behavior of brain: Secondary | ICD-10-CM | POA: Diagnosis not present

## 2020-12-22 DIAGNOSIS — I4819 Other persistent atrial fibrillation: Secondary | ICD-10-CM

## 2020-12-22 NOTE — Patient Instructions (Addendum)
Medication Instructions:  Your physician recommends that you continue on your current medications as directed. Please refer to the Current Medication list given to you today. *If you need a refill on your cardiac medications before your next appointment, please call your pharmacy*  Lab Work: None ordered. If you have labs (blood work) drawn today and your tests are completely normal, you will receive your results only by: [place] (if you have MyChart) OR A paper copy in the mail If you have any lab test that is abnormal or we need to change your treatment, we will call you to review the results.  Testing/Procedures: None ordered.  Follow-Up:  You are scheduled for a nurse visit 12/29/2020 at 8:00 am with Sonia Baller RN for an EKG.

## 2020-12-24 ENCOUNTER — Other Ambulatory Visit: Payer: Self-pay

## 2020-12-24 MED ORDER — INFLUENZA VAC SPLIT QUAD 0.5 ML IM SUSY
PREFILLED_SYRINGE | INTRAMUSCULAR | 0 refills | Status: DC
  Filled 2020-12-24: qty 0.5, 1d supply, fill #0

## 2020-12-29 ENCOUNTER — Ambulatory Visit (INDEPENDENT_AMBULATORY_CARE_PROVIDER_SITE_OTHER): Payer: 59

## 2020-12-29 ENCOUNTER — Other Ambulatory Visit: Payer: Self-pay

## 2020-12-29 ENCOUNTER — Ambulatory Visit: Payer: 59

## 2020-12-29 DIAGNOSIS — I480 Paroxysmal atrial fibrillation: Secondary | ICD-10-CM | POA: Diagnosis not present

## 2020-12-29 MED ORDER — APIXABAN 5 MG PO TABS: 5.0000 mg | ORAL_TABLET | Freq: Two times a day (BID) | ORAL | 11 refills | Status: DC

## 2020-12-29 NOTE — Patient Instructions (Addendum)
Medication Instructions:  Your physician has recommended you make the following change in your medication:    START taking Eliquis 5 mg-  Take one tablet by mouth twice a day   Lab Work: None ordered. If you have labs (blood work) drawn today and your tests are completely normal, you will receive your results only by: [place] (if you have MyChart) OR A paper copy in the mail If you have any lab test that is abnormal or we need to change your treatment, we will call you to review the results.  Testing/Procedures: Your physician has recommended that you have a Cardioversion (DCCV). Electrical Cardioversion uses a jolt of electricity to your heart either through paddles or wired patches attached to your chest. This is a controlled, usually prescheduled, procedure. Defibrillation is done under light anesthesia in the hospital, and you usually go home the day of the procedure. This is done to get your heart back into a normal rhythm. You are not awake for the procedure. Please see the instruction sheet given to you today.   Follow-Up: At [facility], you and your health needs are our priority.  As part of our continuing mission to provide you with exceptional heart care, we have created designated Provider Care Teams.  These Care Teams include your primary Cardiologist (physician) and Advanced Practice Providers (APPs -  Physician Assistants and Nurse Practitioners) who all work together to provide you with the care you need, when you need it.  Your next appointment:   Your physician wants you to follow-up in: 8 weeks with Dr. Hunt Oris are scheduled for a Cardioversion on January 28, 2021 with Dr. Rockey Situ Please arrive at the [place] [facility] at 8:00 am on the day of your procedure.  DIET INSTRUCTIONS:  Nothing to eat or drink after midnight except your medications with a              sip of water.         Labs: on arrival  Medications:  YOU MAY TAKE ALL of your  remaining medications with a small amount of water.  Must have a responsible person to drive you home.  Bring a current list of your medications and current insurance cards.    If you have any questions after you get home, please call the office at [phone]

## 2020-12-29 NOTE — Progress Notes (Signed)
Reason for visit: 4 week follow up new start Toprol  Name of MD requesting visit: Dr. Quentin Ore  H&P: Pt with atrial fibrillation.  Started Toprol 4 weeks ago.  Follow up to determine if Pt is still in afib.  ROS related to problem: Patient states she has not had any issues with Toprol.    Assessment and plan per MD: EKG reviewed by Dr. Quentin Ore.  Pt continues to be in afib.  Pt scheduled for DCCV in 4 weeks after new start Eliquis.  Will follow up with Dr. Quentin Ore 4 weeks after DCCV.

## 2021-01-19 ENCOUNTER — Ambulatory Visit (INDEPENDENT_AMBULATORY_CARE_PROVIDER_SITE_OTHER): Payer: 59 | Admitting: Certified Nurse Midwife

## 2021-01-19 ENCOUNTER — Other Ambulatory Visit: Payer: Self-pay

## 2021-01-19 VITALS — BP 123/71 | HR 88 | Ht 61.0 in | Wt >= 6400 oz

## 2021-01-19 DIAGNOSIS — Z01419 Encounter for gynecological examination (general) (routine) without abnormal findings: Secondary | ICD-10-CM | POA: Diagnosis not present

## 2021-01-19 MED ORDER — MEDROXYPROGESTERONE ACETATE 2.5 MG PO TABS: ORAL_TABLET | Freq: Every day | ORAL | 3 refills | Status: DC

## 2021-01-19 NOTE — Progress Notes (Signed)
GYNECOLOGY ANNUAL PREVENTATIVE CARE ENCOUNTER NOTE  History:     Belinda Garrett is a 50 y.o. G0P0000 female here for a routine annual gynecologic exam.  Current complaints: none.   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 Denies abnormal vaginal bleeding, discharge, pelvic pain, problems with intercourse or other gynecologic concerns.     Social Relationship: Single Living: alone Work: none Exercise: Smoke/Alcohol/drug use: denies use   Gynecologic History No LMP recorded (lmp unknown). (Menstrual status: Irregular Periods). Contraception:  provera for bleeding control  Last Pap: 01/14/20. Results were: normal with negative HPV Last mammogram: 09/14/17. Results were: normal  Obstetric History OB History  Gravida Para Term Preterm AB Living  0 0 0 0 0 0  SAB IAB Ectopic Multiple Live Births  0 0 0 0 0    Past Medical History:  Diagnosis Date   Abnormal menses    Allergy    Anxiety    Arthritis    Back pain    Back pain    Chicken pox    Depression    Hypertension    Joint pain    Joint pain    Lymphedema    Palpitations    Peripheral neuropathy 09/30/2018   Pre-diabetes    Prediabetes    Rosacea    Seasonal allergies    Sleep apnea    Stasis dermatitis of both legs    Vertigo     Past Surgical History:  Procedure Laterality Date   APPLICATION OF CRANIAL NAVIGATION N/A 09/28/2020   Procedure: APPLICATION OF CRANIAL NAVIGATION;  Surgeon: Judith Part, MD;  Location: [place];  Service: Neurosurgery;  Laterality: N/A;   CRANIOTOMY Left 09/28/2020   Procedure: Left Occipital craniotomy for tumor resection;  Surgeon: Judith Part, MD;  Location: [place];  Service: Neurosurgery;  Laterality: Left;  RM 20   WISDOM TOOTH EXTRACTION      Current Outpatient Medications on File Prior to Visit  Medication Sig Dispense Refill   Alpha-Lipoic Acid 600 MG TABS Take 600 mg by mouth daily.     apixaban (ELIQUIS) 5 MG TABS tablet Take 1  tablet (5 mg total) by mouth 2 (two) times daily. 60 tablet 11   B Complex Vitamins (VITAMIN B COMPLEX PO) Take 1 capsule by mouth daily as needed (energy boost).     buPROPion (WELLBUTRIN SR) 150 MG 12 hr tablet TAKE 1 TABLET BY MOUTH TWICE DAILY 60 tablet 0   Cholecalciferol (VITAMIN D3) 50 MCG (2000 UT) capsule Take 2,000 Units by mouth daily.     FLUoxetine (PROZAC) 40 MG capsule Take 1 capsule (40 mg total) by mouth daily. 90 capsule 3   gabapentin (NEURONTIN) 400 MG capsule Take 1 capsule (400 mg total) by mouth at bedtime for 90 days 90 capsule 3   glucosamine-chondroitin 500-400 MG tablet Take 1 tablet by mouth daily.     hydrochlorothiazide (HYDRODIURIL) 25 MG tablet TAKE 1 TABLET BY MOUTH DAILY IN MORNING 90 tablet 3   HYDROcodone-acetaminophen (NORCO/VICODIN) 5-325 MG tablet Take 1 tablet by mouth every 4 (four) hours as needed for moderate pain. 30 tablet 0   influenza vac split quadrivalent PF (FLUARIX) 0.5 ML injection Inject into the muscle. 0.5 mL 0   lisinopril (ZESTRIL) 30 MG tablet TAKE 1 TABLET (30 MG TOTAL) BY MOUTH DAILY. 90 tablet 3   loratadine (CLARITIN) 10 MG tablet Take 10 mg by mouth daily.     medroxyPROGESTERone (  PROVERA) 2.5 MG tablet TAKE 1 TABLET BY MOUTH DAILY 90 tablet 2   metFORMIN (GLUCOPHAGE) 500 MG tablet TAKE 1 TABLET (500 MG TOTAL) BY MOUTH DAILY WITH BREAKFAST. 90 tablet 3   metoprolol succinate (TOPROL XL) 25 MG 24 hr tablet Take 0.5 tablets (12.5 mg total) by mouth daily. 45 tablet 3   metroNIDAZOLE (METROGEL) 1 % gel Apply to face daily 60 g 5   Multiple Vitamin (MULTIVITAMIN) capsule Take 1 capsule by mouth daily.     Omega-3 Fatty Acids (FISH OIL) 1000 MG CAPS Take 1,000 mg by mouth daily.     No current facility-administered medications on file prior to visit.    Allergies  Allergen Reactions   Dust Mite Extract     Sneezing, itchy, watery eyes   Latex     Tingling sensation   Pollen Extract     Sneezing, itchy, watery eyes    Social  History:  reports that she has never smoked. She has never used smokeless tobacco. She reports that she does not currently use alcohol. She reports that she does not currently use drugs.  Family History  Problem Relation Age of Onset   Arthritis Mother    Depression Mother    Hypertension Mother    Thyroid disease Mother    Obesity Mother    Depression Father    Parkinson's disease Father    Dementia Father    Obesity Father    Arthritis Maternal Grandmother    Heart disease Maternal Grandmother    Hypertension Maternal Grandmother    Hyperlipidemia Maternal Grandmother    Stroke Maternal Grandmother    Alcohol abuse Maternal Grandfather    Arthritis Maternal Grandfather    Depression Maternal Grandfather    Hearing loss Maternal Grandfather    Heart disease Maternal Grandfather    Stroke Maternal Grandfather    Arthritis Paternal Grandmother    Arthritis Paternal Grandfather    Depression Paternal Grandfather    Heart disease Paternal Grandfather    Hypertension Paternal Grandfather    Stroke Paternal Grandfather    Breast cancer Neg Hx     The following portions of the patient's history were reviewed and updated as appropriate: allergies, current medications, past family history, past medical history, past social history, past surgical history and problem list.  Review of Systems Pertinent items noted in HPI and remainder of comprehensive ROS otherwise negative.  Physical Exam:  BP 123/71   Pulse 88   Ht 5\' 1"  (1.549 m)   Wt (!) 458 lb 8 oz (208 kg)   LMP  (LMP Unknown)   BMI 86.63 kg/m  CONSTITUTIONAL: Well-developed, well-nourished female in no acute distress.  HENT:  Normocephalic, atraumatic, External right and left ear normal. Oropharynx is clear and moist EYES: Conjunctivae and EOM are normal. Pupils are equal, round, and reactive to light. No scleral icterus.  NECK: Normal range of motion, supple, no masses.  Normal thyroid.  SKIN: Skin is warm and dry. No  rash noted. Not diaphoretic. No erythema. No pallor. MUSCULOSKELETAL: Normal range of motion. No tenderness.  No cyanosis, clubbing, or edema.  2+ distal pulses. NEUROLOGIC: Alert and oriented to person, place, and time. Normal reflexes, muscle tone coordination.  PSYCHIATRIC: Normal mood and affect. Normal behavior. Normal judgment and thought content. CARDIOVASCULAR: Normal heart rate noted, regular rhythm RESPIRATORY: Clear to auscultation bilaterally. Effort and breath sounds normal, no problems with respiration noted. BREASTS: Symmetric in size. No masses, tenderness, skin changes, nipple drainage, or lymphadenopathy  bilaterally.  ABDOMEN: Soft, no distention noted.  No tenderness, rebound or guarding.  PELVIC: Pt declines, not due for pap has no abnormal discharge, odor, or itching. .   Assessment and Plan:    1. Well woman exam with routine gynecological exam   Pap: not due 2026 Mammogram : ordered Labs: done by PCP  Refills: provera, discussed menopause, she request to stay on it due to all of the things she has going on right now , she tried to stop last year and had several months of heavy bleeding. Discusses potential risks. Referral: none Routine preventative health maintenance measures emphasized. Please refer to After Visit Summary for other counseling recommendations.      Philip Aspen, CNM [facility]

## 2021-01-20 ENCOUNTER — Other Ambulatory Visit: Payer: Self-pay

## 2021-01-20 ENCOUNTER — Encounter: Payer: Self-pay | Admitting: Internal Medicine

## 2021-01-20 ENCOUNTER — Ambulatory Visit (INDEPENDENT_AMBULATORY_CARE_PROVIDER_SITE_OTHER): Payer: 59 | Admitting: Internal Medicine

## 2021-01-20 VITALS — BP 112/74 | HR 100 | Temp 98.4°F | Ht 61.0 in | Wt >= 6400 oz

## 2021-01-20 DIAGNOSIS — I10 Essential (primary) hypertension: Secondary | ICD-10-CM

## 2021-01-20 DIAGNOSIS — R7303 Prediabetes: Secondary | ICD-10-CM

## 2021-01-20 DIAGNOSIS — Z1211 Encounter for screening for malignant neoplasm of colon: Secondary | ICD-10-CM

## 2021-01-20 DIAGNOSIS — Z0000 Encounter for general adult medical examination without abnormal findings: Secondary | ICD-10-CM

## 2021-01-20 DIAGNOSIS — E785 Hyperlipidemia, unspecified: Secondary | ICD-10-CM | POA: Diagnosis not present

## 2021-01-20 DIAGNOSIS — K219 Gastro-esophageal reflux disease without esophagitis: Secondary | ICD-10-CM | POA: Diagnosis not present

## 2021-01-20 DIAGNOSIS — D72829 Elevated white blood cell count, unspecified: Secondary | ICD-10-CM

## 2021-01-20 DIAGNOSIS — Z23 Encounter for immunization: Secondary | ICD-10-CM | POA: Diagnosis not present

## 2021-01-20 DIAGNOSIS — I4891 Unspecified atrial fibrillation: Secondary | ICD-10-CM

## 2021-01-20 LAB — CBC WITH DIFFERENTIAL/PLATELET
Basophils Absolute: 0 10*3/uL (ref 0.0–0.1)
Basophils Relative: 0.5 % (ref 0.0–3.0)
Eosinophils Absolute: 0.1 10*3/uL (ref 0.0–0.7)
Eosinophils Relative: 1.5 % (ref 0.0–5.0)
HCT: 35.1 % — ABNORMAL LOW (ref 36.0–46.0)
Hemoglobin: 10.6 g/dL — ABNORMAL LOW (ref 12.0–15.0)
Lymphocytes Relative: 18 % (ref 12.0–46.0)
Lymphs Abs: 1.6 10*3/uL (ref 0.7–4.0)
MCHC: 30.3 g/dL (ref 30.0–36.0)
MCV: 72.8 fl — ABNORMAL LOW (ref 78.0–100.0)
Monocytes Absolute: 0.6 10*3/uL (ref 0.1–1.0)
Monocytes Relative: 6.8 % (ref 3.0–12.0)
Neutro Abs: 6.5 10*3/uL (ref 1.4–7.7)
Neutrophils Relative %: 73.2 % (ref 43.0–77.0)
Platelets: 281 10*3/uL (ref 150.0–400.0)
RBC: 4.82 Mil/uL (ref 3.87–5.11)
RDW: 20.4 % — ABNORMAL HIGH (ref 11.5–15.5)
WBC: 8.9 10*3/uL (ref 4.0–10.5)

## 2021-01-20 LAB — COMPREHENSIVE METABOLIC PANEL
ALT: 16 U/L (ref 0–35)
AST: 14 U/L (ref 0–37)
Albumin: 4 g/dL (ref 3.5–5.2)
Alkaline Phosphatase: 54 U/L (ref 39–117)
BUN: 16 mg/dL (ref 6–23)
CO2: 27 mEq/L (ref 19–32)
Calcium: 9 mg/dL (ref 8.4–10.5)
Chloride: 103 mEq/L (ref 96–112)
Creatinine, Ser: 0.93 mg/dL (ref 0.40–1.20)
GFR: 70.97 mL/min (ref >60.00)
Glucose, Bld: 126 mg/dL — ABNORMAL HIGH (ref 70–99)
Potassium: 4.3 mEq/L (ref 3.5–5.1)
Sodium: 139 mEq/L (ref 135–145)
Total Bilirubin: 1 mg/dL (ref 0.2–1.2)
Total Protein: 6 g/dL (ref 6.0–8.3)

## 2021-01-20 LAB — LIPID PANEL
Cholesterol: 173 mg/dL (ref 0–200)
HDL: 36.9 mg/dL — ABNORMAL LOW (ref >39.00)
LDL Cholesterol: 109 mg/dL — ABNORMAL HIGH (ref 0–99)
NonHDL: 136.26
Total CHOL/HDL Ratio: 5
Triglycerides: 136 mg/dL (ref 0.0–149.0)
VLDL: 27.2 mg/dL (ref 0.0–40.0)

## 2021-01-20 LAB — MAGNESIUM: Magnesium: 2.1 mg/dL (ref 1.5–2.5)

## 2021-01-20 LAB — TSH: TSH: 1.55 u[IU]/mL (ref 0.35–5.50)

## 2021-01-20 LAB — HEMOGLOBIN A1C: Hgb A1c MFr Bld: 6.4 % (ref 4.6–6.5)

## 2021-01-20 MED ORDER — SHINGRIX 50 MCG/0.5ML IM SUSR: 0.5000 mL | Freq: Once | INTRAMUSCULAR | 0 refills | Status: AC

## 2021-01-20 MED ORDER — PNEUMOCOCCAL 13-VAL CONJ VACC IM SUSP: 0.5000 mL | Freq: Once | INTRAMUSCULAR | 0 refills | Status: AC

## 2021-01-20 NOTE — Patient Instructions (Addendum)
Pepcid 1-2 x per day if not working nexium otc 1-2 x per day Consider shingrix and prevnar   Consider [facility] and PT for lymphedema in the future let me know    Zoster Vaccine, Recombinant injection What is this medication? ZOSTER VACCINE (ZOS ter vak SEEN) is a vaccine used to reduce the risk of getting shingles. This vaccine is not used to treat shingles or nerve pain from shingles. This medicine may be used for other purposes; ask your health care provider or pharmacist if you have questions. COMMON BRAND NAME(S): [facility] What should I tell my care team before I take this medication? They need to know if you have any of these conditions: cancer immune system problems an unusual or allergic reaction to Zoster vaccine, other medications, foods, dyes, or preservatives pregnant or trying to get pregnant breast-feeding How should I use this medication? This vaccine is injected into a muscle. It is given by a health care provider. A copy of Vaccine Information Statements will be given before each vaccination. Be sure to read this information carefully each time. This sheet may change often. Talk to your health care provider about the use of this vaccine in children. This vaccine is not approved for use in children. Overdosage: If you think you have taken too much of this medicine contact a poison control center or emergency room at once. NOTE: This medicine is only for you. Do not share this medicine with others. What if I miss a dose? Keep appointments for follow-up (booster) doses. It is important not to miss your dose. Call your health care provider if you are unable to keep an appointment. What may interact with this medication? medicines that suppress your immune system medicines to treat cancer steroid medicines like prednisone or cortisone This list may not describe all possible interactions. Give your health care provider a list of all the medicines, herbs,  non-prescription drugs, or dietary supplements you use. Also tell them if you smoke, drink alcohol, or use illegal drugs. Some items may interact with your medicine. What should I watch for while using this medication? Visit your health care provider regularly. This vaccine, like all vaccines, may not fully protect everyone. What side effects may I notice from receiving this medication? Side effects that you should report to your doctor or health care professional as soon as possible: allergic reactions (skin rash, itching or hives; swelling of the face, lips, or tongue) trouble breathing Side effects that usually do not require medical attention (report these to your doctor or health care professional if they continue or are bothersome): chills headache fever nausea pain, redness, or irritation at site where injected tiredness vomiting This list may not describe all possible side effects. Call your doctor for medical advice about side effects. You may report side effects to FDA at 1-800-FDA-1088. Where should I keep my medication? This vaccine is only given by a health care provider. It will not be stored at home. NOTE: This sheet is a summary. It may not cover all possible information. If you have questions about this medicine, talk to your doctor, pharmacist, or health care provider.  2022 Elsevier/Gold Standard (2019-04-11 16:23:07)  Pneumococcal Conjugate Vaccine (Prevnar 13) Suspension for Injection What is this medication? PNEUMOCOCCAL VACCINE (NEU mo KOK al vak SEEN) is a vaccine used to prevent pneumococcus bacterial infections. These bacteria can cause serious infections like pneumonia, meningitis, and blood infections. This vaccine will lower your chance of getting pneumonia. If you do get pneumonia,  it can make your symptoms milder and your illness shorter. This vaccine will not treat an infection and will not cause infection. This vaccine is recommended for infants and young  children, adults with certain medical conditions, and adults 83 years or older. This medicine may be used for other purposes; ask your health care provider or pharmacist if you have questions. COMMON BRAND NAME(S): Prevnar, Prevnar 13 What should I tell my care team before I take this medication? They need to know if you have any of these conditions: bleeding problems fever immune system problems an unusual or allergic reaction to pneumococcal vaccine, diphtheria toxoid, other vaccines, latex, other medicines, foods, dyes, or preservatives pregnant or trying to get pregnant breast-feeding How should I use this medication? This vaccine is for injection into a muscle. It is given by a health care professional. A copy of Vaccine Information Statements will be given before each vaccination. Read this sheet carefully each time. The sheet may change frequently. Talk to your pediatrician regarding the use of this medicine in children. While this drug may be prescribed for children as young as 67 weeks old for selected conditions, precautions do apply. Overdosage: If you think you have taken too much of this medicine contact a poison control center or emergency room at once. NOTE: This medicine is only for you. Do not share this medicine with others. What if I miss a dose? It is important not to miss your dose. Call your doctor or health care professional if you are unable to keep an appointment. What may interact with this medication? medicines for cancer chemotherapy medicines that suppress your immune function steroid medicines like prednisone or cortisone This list may not describe all possible interactions. Give your health care provider a list of all the medicines, herbs, non-prescription drugs, or dietary supplements you use. Also tell them if you smoke, drink alcohol, or use illegal drugs. Some items may interact with your medicine. What should I watch for while using this medication? Mild  fever and pain should go away in 3 days or less. Report any unusual symptoms to your doctor or health care professional. What side effects may I notice from receiving this medication? Side effects that you should report to your doctor or health care professional as soon as possible: allergic reactions like skin rash, itching or hives, swelling of the face, lips, or tongue breathing problems confused fast or irregular heartbeat fever over 102 degrees F seizures unusual bleeding or bruising unusual muscle weakness Side effects that usually do not require medical attention (report to your doctor or health care professional if they continue or are bothersome): aches and pains diarrhea fever of 102 degrees F or less headache irritable loss of appetite pain, tender at site where injected trouble sleeping This list may not describe all possible side effects. Call your doctor for medical advice about side effects. You may report side effects to FDA at 1-800-FDA-1088. Where should I keep my medication? This does not apply. This vaccine is given in a clinic, pharmacy, doctor's office, or other health care setting and will not be stored at home. NOTE: This sheet is a summary. It may not cover all possible information. If you have questions about this medicine, talk to your doctor, pharmacist, or health care provider.  2022 Elsevier/Gold Standard (2013-12-11 10:27:27)  Gastroesophageal Reflux Disease, Adult Gastroesophageal reflux (GER) happens when acid from the stomach flows up into the tube that connects the mouth and the stomach (esophagus). Normally, food travels down  the esophagus and stays in the stomach to be digested. However, when a person has GER, food and stomach acid sometimes move back up into the esophagus. If this becomes a more serious problem, the person may be diagnosed with a disease called gastroesophageal reflux disease (GERD). GERD occurs when the reflux: Happens  often. Causes frequent or severe symptoms. Causes problems such as damage to the esophagus. When stomach acid comes in contact with the esophagus, the acid may cause inflammation in the esophagus. Over time, GERD may create small holes (ulcers) in the lining of the esophagus. What are the causes? This condition is caused by a problem with the muscle between the esophagus and the stomach (lower esophageal sphincter, or LES). Normally, the LES muscle closes after food passes through the esophagus to the stomach. When the LES is weakened or abnormal, it does not close properly, and that allows food and stomach acid to go back up into the esophagus. The LES can be weakened by certain dietary substances, medicines, and medical conditions, including: Tobacco use. Pregnancy. Having a hiatal hernia. Alcohol use. Certain foods and beverages, such as coffee, chocolate, onions, and peppermint. What increases the risk? You are more likely to develop this condition if you: Have an increased body weight. Have a connective tissue disorder. Take NSAIDs, such as ibuprofen. What are the signs or symptoms? Symptoms of this condition include: Heartburn. Difficult or painful swallowing and the feeling of having a lump in the throat. A bitter taste in the mouth. Bad breath and having a large amount of saliva. Having an upset or bloated stomach and belching. Chest pain. Different conditions can cause chest pain. Make sure you see your health care provider if you experience chest pain. Shortness of breath or wheezing. Ongoing (chronic) cough or a nighttime cough. Wearing away of tooth enamel. Weight loss. How is this diagnosed? This condition may be diagnosed based on a medical history and a physical exam. To determine if you have mild or severe GERD, your health care provider may also monitor how you respond to treatment. You may also have tests, including: A test to examine your stomach and esophagus with a  small camera (endoscopy). A test that measures the acidity level in your esophagus. A test that measures how much pressure is on your esophagus. A barium swallow or modified barium swallow test to show the shape, size, and functioning of your esophagus. How is this treated? Treatment for this condition may vary depending on how severe your symptoms are. Your health care provider may recommend: Changes to your diet. Medicine. Surgery. The goal of treatment is to help relieve your symptoms and to prevent complications. Follow these instructions at home: Eating and drinking  Follow a diet as recommended by your health care provider. This may involve avoiding foods and drinks such as: Coffee and tea, with or without caffeine. Drinks that contain alcohol. Energy drinks and sports drinks. Carbonated drinks or sodas. Chocolate and cocoa. Peppermint and mint flavorings. Garlic and onions. Horseradish. Spicy and acidic foods, including peppers, chili powder, curry powder, vinegar, hot sauces, and barbecue sauce. Citrus fruit juices and citrus fruits, such as oranges, lemons, and limes. Tomato-based foods, such as red sauce, chili, salsa, and pizza with red sauce. Fried and fatty foods, such as donuts, french fries, potato chips, and high-fat dressings. High-fat meats, such as hot dogs and fatty cuts of red and white meats, such as rib eye steak, sausage, ham, and bacon. High-fat dairy items, such as whole  milk, butter, and cream cheese. Eat small, frequent meals instead of large meals. Avoid drinking large amounts of liquid with your meals. Avoid eating meals during the 2-3 hours before bedtime. Avoid lying down right after you eat. Do not exercise right after you eat. Lifestyle  Do not use any products that contain nicotine or tobacco. These products include cigarettes, chewing tobacco, and vaping devices, such as e-cigarettes. If you need help quitting, ask your health care provider. Try  to reduce your stress by using methods such as yoga or meditation. If you need help reducing stress, ask your health care provider. If you are overweight, reduce your weight to an amount that is healthy for you. Ask your health care provider for guidance about a safe weight loss goal. General instructions Pay attention to any changes in your symptoms. Take over-the-counter and prescription medicines only as told by your health care provider. Do not take aspirin, ibuprofen, or other NSAIDs unless your health care provider told you to take these medicines. Wear loose-fitting clothing. Do not wear anything tight around your waist that causes pressure on your abdomen. Raise (elevate) the head of your bed about 6 inches (15 cm). You can use a wedge to do this. Avoid bending over if this makes your symptoms worse. Keep all follow-up visits. This is important. Contact a health care provider if: You have: New symptoms. Unexplained weight loss. Difficulty swallowing or it hurts to swallow. Wheezing or a persistent cough. A hoarse voice. Your symptoms do not improve with treatment. Get help right away if: You have sudden pain in your arms, neck, jaw, teeth, or back. You suddenly feel sweaty, dizzy, or light-headed. You have chest pain or shortness of breath. You vomit and the vomit is green, yellow, or black, or it looks like blood or coffee grounds. You faint. You have stool that is red, bloody, or black. You cannot swallow, drink, or eat. These symptoms may represent a serious problem that is an emergency. Do not wait to see if the symptoms will go away. Get medical help right away. Call your local emergency services (911 in the [place] Do not drive yourself to the hospital. Summary Gastroesophageal reflux happens when acid from the stomach flows up into the esophagus. GERD is a disease in which the reflux happens often, causes frequent or severe symptoms, or causes problems such as damage to the  esophagus. Treatment for this condition may vary depending on how severe your symptoms are. Your health care provider may recommend diet and lifestyle changes, medicine, or surgery. Contact a health care provider if you have new or worsening symptoms. Take over-the-counter and prescription medicines only as told by your health care provider. Do not take aspirin, ibuprofen, or other NSAIDs unless your health care provider told you to do so. Keep all follow-up visits as told by your health care provider. This is important. This information is not intended to replace advice given to you by your health care provider. Make sure you discuss any questions you have with your health care provider. Document Revised: 09/15/2019 Document Reviewed: 09/15/2019 Elsevier Patient Education  2022 [place] for Gastroesophageal Reflux Disease, Adult When you have gastroesophageal reflux disease (GERD), the foods you eat and your eating habits are very important. Choosing the right foods can help ease the discomfort of GERD. Consider working with a dietitian to help you make healthy food choices. What are tips for following this plan? Reading food labels Look for foods that are  low in saturated fat. Foods that have less than 5% of daily value (DV) of fat and 0 g of trans fats may help with your symptoms. Cooking Cook foods using methods other than frying. This may include baking, steaming, grilling, or broiling. These are all methods that do not need a lot of fat for cooking. To add flavor, try to use herbs that are low in spice and acidity. Meal planning  Choose healthy foods that are low in fat, such as fruits, vegetables, whole grains, low-fat dairy products, lean meats, fish, and poultry. Eat frequent, small meals instead of three large meals each day. Eat your meals slowly, in a relaxed setting. Avoid bending over or lying down until 2-3 hours after eating. Limit high-fat foods such as fatty  meats or fried foods. Limit your intake of fatty foods, such as oils, butter, and shortening. Avoid the following as told by your health care provider: Foods that cause symptoms. These may be different for different people. Keep a food diary to keep track of foods that cause symptoms. Alcohol. Drinking large amounts of liquid with meals. Eating meals during the 2-3 hours before bed. Lifestyle Maintain a healthy weight. Ask your health care provider what weight is healthy for you. If you need to lose weight, work with your health care provider to do so safely. Exercise for at least 30 minutes on 5 or more days each week, or as told by your health care provider. Avoid wearing clothes that fit tightly around your waist and chest. Do not use any products that contain nicotine or tobacco. These products include cigarettes, chewing tobacco, and vaping devices, such as e-cigarettes. If you need help quitting, ask your health care provider. Sleep with the head of your bed raised. Use a wedge under the mattress or blocks under the bed frame to raise the head of the bed. Chew sugar-free gum after mealtimes. What foods should I eat? Eat a healthy, well-balanced diet of fruits, vegetables, whole grains, low-fat dairy products, lean meats, fish, and poultry. Each person is different. Foods that may trigger symptoms in one person may not trigger any symptoms in another person. Work with your health care provider to identify foods that are safe for you. The items listed above may not be a complete list of recommended foods and beverages. Contact a dietitian for more information. What foods should I avoid? Limiting some of these foods may help manage the symptoms of GERD. Everyone is different. Consult a dietitian or your health care provider to help you identify the exact foods to avoid, if any. Fruits Any fruits prepared with added fat. Any fruits that cause symptoms. For some people this may include citrus  fruits, such as oranges, grapefruit, pineapple, and lemons. Vegetables Deep-fried vegetables. [place] fries. Any vegetables prepared with added fat. Any vegetables that cause symptoms. For some people, this may include tomatoes and tomato products, chili peppers, onions and garlic, and horseradish. Grains Pastries or quick breads with added fat. Meats and other proteins High-fat meats, such as fatty beef or pork, hot dogs, ribs, ham, sausage, salami, and bacon. Fried meat or protein, including fried fish and fried chicken. Nuts and nut butters, in large amounts. Dairy Whole milk and chocolate milk. Sour cream. Cream. Ice cream. Cream cheese. Milkshakes. Fats and oils Butter. Margarine. Shortening. Ghee. Beverages Coffee and tea, with or without caffeine. Carbonated beverages. Sodas. Energy drinks. Fruit juice made with acidic fruits, such as orange or grapefruit. Tomato juice. Alcoholic drinks. Sweets and  desserts Chocolate and cocoa. Donuts. Seasonings and condiments Pepper. Peppermint and spearmint. Added salt. Any condiments, herbs, or seasonings that cause symptoms. For some people, this may include curry, hot sauce, or vinegar-based salad dressings. The items listed above may not be a complete list of foods and beverages to avoid. Contact a dietitian for more information. Questions to ask your health care provider Diet and lifestyle changes are usually the first steps that are taken to manage symptoms of GERD. If diet and lifestyle changes do not improve your symptoms, talk with your health care provider about taking medicines. Where to find more information [employer] G[employer]intestinal [employer]: aboutgerd.org Summary When you have gastroesophageal reflux disease (GERD), food and lifestyle choices may be very helpful in easing the discomfort of GERD. Eat frequent, small meals instead of three large meals each day. Eat your meals slowly, in a relaxed setting. Avoid  bending over or lying down until 2-3 hours after eating. Limit high-fat foods such as fatty meats or fried foods. This information is not intended to replace advice given to you by your health care provider. Make sure you discuss any questions you have with your health care provider. Document Revised: 09/15/2019 Document Reviewed: 09/15/2019 Elsevier Patient Education  [place].

## 2021-01-20 NOTE — Progress Notes (Signed)
Chief Complaint  Patient presents with   Annual Exam   Annual  1. 6 months gerd sxs only had water this am and throat is burning and due colonoscoyp will refer to GI  2. Chronic lymphedema disc vascular referral and consider compression and lymph pumps  3. Afib pending CVS 01/28/21 on eliquid 5 mg bid  Review of Systems  Constitutional:  Negative for weight loss.  HENT:  Negative for hearing loss.   Eyes:  Negative for blurred vision.  Respiratory:  Negative for shortness of breath.   Cardiovascular:  Positive for palpitations and leg swelling. Negative for chest pain.  Gastrointestinal:  Negative for abdominal pain.  Musculoskeletal:  Negative for falls and joint pain.  Skin:  Negative for rash.  Neurological:  Negative for headaches.  Psychiatric/Behavioral:  Negative for depression.   Past Medical History:  Diagnosis Date   Abnormal menses    Allergy    Anxiety    Arthritis    Back pain    Back pain    Chicken pox    Depression    Hypertension    Joint pain    Joint pain    Lymphedema    Palpitations    Peripheral neuropathy 09/30/2018   Pre-diabetes    Prediabetes    Rosacea    Seasonal allergies    Sleep apnea    Stasis dermatitis of both legs    Vertigo    Past Surgical History:  Procedure Laterality Date   APPLICATION OF CRANIAL NAVIGATION N/A 09/28/2020   Procedure: APPLICATION OF CRANIAL NAVIGATION;  Surgeon: Judith Part, MD;  Location: [place];  Service: Neurosurgery;  Laterality: N/A;   CRANIOTOMY Left 09/28/2020   Procedure: Left Occipital craniotomy for tumor resection;  Surgeon: Judith Part, MD;  Location: [place];  Service: Neurosurgery;  Laterality: Left;  RM 53   WISDOM TOOTH EXTRACTION     Family History  Problem Relation Age of Onset   Arthritis Mother    Depression Mother    Hypertension Mother    Thyroid disease Mother    Obesity Mother    Depression Father    Parkinson's disease Father    Dementia Father    Obesity Father     Arthritis Maternal Grandmother    Heart disease Maternal Grandmother    Hypertension Maternal Grandmother    Hyperlipidemia Maternal Grandmother    Stroke Maternal Grandmother    Alcohol abuse Maternal Grandfather    Arthritis Maternal Grandfather    Depression Maternal Grandfather    Hearing loss Maternal Grandfather    Heart disease Maternal Grandfather    Stroke Maternal Grandfather    Arthritis Paternal Grandmother    Arthritis Paternal Grandfather    Depression Paternal Grandfather    Heart disease Paternal Grandfather    Hypertension Paternal Grandfather    Stroke Paternal Grandfather    Breast cancer Neg Hx    Social History   Socioeconomic History   Marital status: Single    Spouse name: Not on file   Number of children: Not on file   Years of education: Not on file   Highest education level: Not on file  Occupational History   Occupation: work from home, Designer, jewellery - [place]  Tobacco Use   Smoking status: Never   Smokeless tobacco: Never  Vaping Use   Vaping Use: Never used  Substance and Sexual Activity   Alcohol use: Not Currently    Alcohol/week: 0.0 standard drinks  Drug use: Not Currently   Sexual activity: Never  Other Topics Concern   Not on file  Social History Narrative   Works for [employer] from home    No kids    No guns    Wears seat belt    Safe in relationship    Social Determinants of Health   Financial Resource Strain: Not on file  Food Insecurity: Not on file  Transportation Needs: Not on file  Physical Activity: Not on file  Stress: Not on file  Social Connections: Not on file  Intimate Partner Violence: Not on file   Current Meds  Medication Sig   Alpha-Lipoic Acid 600 MG TABS Take 600 mg by mouth daily.   apixaban (ELIQUIS) 5 MG TABS tablet Take 1 tablet (5 mg total) by mouth 2 (two) times daily.   Cholecalciferol (VITAMIN D3) 50 MCG (2000 UT) capsule Take 2,000 Units by mouth daily.   FLUoxetine (PROZAC) 40  MG capsule Take 1 capsule (40 mg total) by mouth daily.   gabapentin (NEURONTIN) 400 MG capsule Take 1 capsule (400 mg total) by mouth at bedtime for 90 days   glucosamine-chondroitin 500-400 MG tablet Take 1 tablet by mouth daily.   hydrochlorothiazide (HYDRODIURIL) 25 MG tablet TAKE 1 TABLET BY MOUTH DAILY IN MORNING   lisinopril (ZESTRIL) 30 MG tablet TAKE 1 TABLET (30 MG TOTAL) BY MOUTH DAILY.   loratadine (CLARITIN) 10 MG tablet Take 10 mg by mouth daily.   medroxyPROGESTERone (PROVERA) 2.5 MG tablet TAKE 1 TABLET BY MOUTH DAILY   metFORMIN (GLUCOPHAGE) 500 MG tablet TAKE 1 TABLET (500 MG TOTAL) BY MOUTH DAILY WITH BREAKFAST.   metoprolol succinate (TOPROL XL) 25 MG 24 hr tablet Take 0.5 tablets (12.5 mg total) by mouth daily.   metroNIDAZOLE (METROGEL) 1 % gel Apply to face daily   Multiple Vitamin (MULTIVITAMIN) capsule Take 1 capsule by mouth daily.   Omega-3 Fatty Acids (FISH OIL) 1000 MG CAPS Take 1,000 mg by mouth daily.   pneumococcal 13-valent conjugate vaccine (PREVNAR 13) SUSP injection Inject 0.5 mLs into the muscle once for 1 dose.   Zoster Vaccine Adjuvanted [facility]) injection Inject 0.5 mLs into the muscle once for 1 dose. X 2 months   Allergies  Allergen Reactions   Dust Mite Extract     Sneezing, itchy, watery eyes   Latex     Tingling sensation   Pollen Extract     Sneezing, itchy, watery eyes   No results found for this or any previous visit (from the past 2160 hour(s)). Objective  Body mass index is 86.73 kg/m. Wt Readings from Last 3 Encounters:  01/20/21 (!) 459 lb (208.2 kg)  01/19/21 (!) 458 lb 8 oz (208 kg)  11/03/20 (!) 459 lb 3.2 oz (208.3 kg)   Temp Readings from Last 3 Encounters:  01/20/21 98.4 F (36.9 C) (Oral)  09/30/20 98.9 F (37.2 C) (Oral)  09/15/20 99 F (37.2 C) (Oral)   BP Readings from Last 3 Encounters:  01/20/21 112/74  01/19/21 123/71  11/03/20 107/68   Pulse Readings from Last 3 Encounters:  01/20/21 100  01/19/21 88   12/29/20 92    Physical Exam Vitals and nursing note reviewed.  Constitutional:      Appearance: Normal appearance. She is well-developed and well-groomed. She is morbidly obese.  HENT:     Head: Normocephalic and atraumatic.  Eyes:     Conjunctiva/sclera: Conjunctivae normal.     Pupils: Pupils are equal, round, and reactive to  light.  Cardiovascular:     Rate and Rhythm: Normal rate. Rhythm irregular.     Heart sounds: Normal heart sounds.  Pulmonary:     Effort: Pulmonary effort is normal.     Breath sounds: Normal breath sounds.  Abdominal:     General: Abdomen is flat. Bowel sounds are normal.  Skin:    General: Skin is warm and moist.     Comments: Lymphedema with chronic leg changes legs  Neurological:     General: No focal deficit present.     Mental Status: She is alert and oriented to person, place, and time. Mental status is at baseline.     Gait: Gait normal.  Psychiatric:        Attention and Perception: Attention and perception normal.        Mood and Affect: Mood and affect normal.        Speech: Speech normal.        Behavior: Behavior normal. Behavior is cooperative.        Thought Content: Thought content normal.        Cognition and Memory: Cognition and memory normal.        Judgment: Judgment normal.    Assessment  Plan  Annual physical exam - Plan: Comprehensive metabolic panel, Lipid panel, CBC with Differential/Platelet, TSH, Hemoglobin A1c  Atrial fibrillation, unspecified type ([place]) - Plan: Microalbumin / creatinine urine ratio, Magnesium Cv sch 01/28/21  On bb and eliquis per cards  Gastroesophageal reflux disease without esophagitis - Plan: Ambulatory referral to Gastroenterology  Hyperlipidemia, htn controlled - Plan: Lipid panel On lis 30 mg qd and bb metop xl 12.5 mg qd   Leukocytosis, unspecified type  Prediabetes - Plan: Hemoglobin A1c    HM Flu shot utd UTD Tdap  rec MMR and hep B vaccine pt to think about covid vx had 3/3  consider 4th dose  Consider shingrix x 2 doses in the future rx pharmacy and rx prevnar   Declines STD testing  Pap 02/02/16 neg neg HPV f/u [facility] Dr. Chauncey Cruel changed to encompass [facility] CC encompass to see if needs to w/u DUB  Pap had 01/14/20 neg neg pap   09/14/17 mammogram neg referred previously overdue  Reschedule as of 01/20/21   Derm is Dr. Evorn Gong saw ln2 2020    Colonoscopy consider in future age 54 y.o 02/2019 vs cologuard disc at f/u  -message me back when ready vs cologuard disc 12/16/19 and 06/16/20 declines  Referred GI   Rec healthy diet and exercise Provider: Dr. Olivia Mackie McLean-Scocuzza-Internal Medicine

## 2021-01-21 LAB — MICROALBUMIN / CREATININE URINE RATIO
Creatinine, Urine: 182 mg/dL (ref 20–275)
Microalb Creat Ratio: 22 mcg/mg creat (ref <30)
Microalb, Ur: 4 mg/dL

## 2021-01-25 ENCOUNTER — Other Ambulatory Visit (HOSPITAL_COMMUNITY): Payer: Self-pay

## 2021-01-25 MED ORDER — APIXABAN 5 MG PO TABS
5.0000 mg | ORAL_TABLET | Freq: Two times a day (BID) | ORAL | 10 refills | Status: DC
Start: 2020-12-29 — End: 2021-12-12
  Filled 2021-01-25: qty 60, 30d supply, fill #0
  Filled 2021-03-07: qty 60, 30d supply, fill #1
  Filled 2021-04-05: qty 180, 90d supply, fill #2
  Filled 2021-06-29: qty 180, 90d supply, fill #3
  Filled 2021-08-28: qty 180, 90d supply, fill #4

## 2021-01-25 MED FILL — Metformin HCl Tab 500 MG: ORAL | 90 days supply | Qty: 90 | Fill #2 | Status: AC

## 2021-01-25 MED FILL — Hydrochlorothiazide Tab 25 MG: ORAL | 90 days supply | Qty: 90 | Fill #1 | Status: AC

## 2021-01-27 ENCOUNTER — Telehealth: Payer: Self-pay | Admitting: Cardiology

## 2021-01-27 NOTE — Telephone Encounter (Signed)
Resent instructions via mychart.

## 2021-01-27 NOTE — Telephone Encounter (Signed)
New Message:      Patient says she is having a Cardioversion tomorrow and have no received any instructions. She said she called the hospital and they told her to call here and get her instructions.

## 2021-01-28 ENCOUNTER — Encounter: Payer: Self-pay | Admitting: Cardiovascular Disease

## 2021-01-28 ENCOUNTER — Ambulatory Visit
Admission: RE | Admit: 2021-01-28 | Discharge: 2021-01-28 | Disposition: A | Discharge status: Home / Self Care | Payer: 59 | Attending: Cardiovascular Disease | Admitting: Cardiovascular Disease

## 2021-01-28 ENCOUNTER — Ambulatory Visit: Payer: 59 | Admitting: Anesthesiology

## 2021-01-28 ENCOUNTER — Encounter
Admission: RE | Disposition: A | Discharge status: Home / Self Care | Payer: Self-pay | Source: Home / Self Care | Attending: Cardiovascular Disease

## 2021-01-28 ENCOUNTER — Other Ambulatory Visit: Payer: Self-pay

## 2021-01-28 DIAGNOSIS — I491 Atrial premature depolarization: Secondary | ICD-10-CM | POA: Insufficient documentation

## 2021-01-28 DIAGNOSIS — Z6841 Body Mass Index (BMI) 40.0 and over, adult: Secondary | ICD-10-CM | POA: Insufficient documentation

## 2021-01-28 DIAGNOSIS — Z79899 Other long term (current) drug therapy: Secondary | ICD-10-CM | POA: Diagnosis not present

## 2021-01-28 DIAGNOSIS — I4819 Other persistent atrial fibrillation: Secondary | ICD-10-CM | POA: Diagnosis not present

## 2021-01-28 DIAGNOSIS — I4891 Unspecified atrial fibrillation: Secondary | ICD-10-CM | POA: Diagnosis not present

## 2021-01-28 DIAGNOSIS — Z7901 Long term (current) use of anticoagulants: Secondary | ICD-10-CM | POA: Insufficient documentation

## 2021-01-28 DIAGNOSIS — I10 Essential (primary) hypertension: Secondary | ICD-10-CM | POA: Insufficient documentation

## 2021-01-28 DIAGNOSIS — K219 Gastro-esophageal reflux disease without esophagitis: Secondary | ICD-10-CM | POA: Insufficient documentation

## 2021-01-28 DIAGNOSIS — F32A Depression, unspecified: Secondary | ICD-10-CM | POA: Diagnosis not present

## 2021-01-28 DIAGNOSIS — I480 Paroxysmal atrial fibrillation: Secondary | ICD-10-CM

## 2021-01-28 HISTORY — PX: CARDIOVERSION: SHX1299

## 2021-01-28 SURGERY — CARDIOVERSION
Anesthesia: General

## 2021-01-28 MED ORDER — PROPOFOL 10 MG/ML IV BOLUS
INTRAVENOUS | Status: AC
  Filled 2021-01-28: qty 20

## 2021-01-28 MED ORDER — SODIUM CHLORIDE 0.9 % IV SOLN
INTRAVENOUS | Status: DC | PRN
Start: 2021-01-28 — End: 2021-01-28

## 2021-01-28 MED ORDER — PROPOFOL 10 MG/ML IV BOLUS
INTRAVENOUS | Status: DC | PRN
  Administered 2021-01-28: 60 mg via INTRAVENOUS
  Administered 2021-01-28: 40 mg via INTRAVENOUS
  Administered 2021-01-28: 20 mg via INTRAVENOUS
  Administered 2021-01-28 (×2): 30 mg via INTRAVENOUS
  Administered 2021-01-28: 20 mg via INTRAVENOUS
  Administered 2021-01-28 (×2): 30 mg via INTRAVENOUS

## 2021-01-28 NOTE — Anesthesia Procedure Notes (Signed)
Procedure Name: MAC Date/Time: 01/28/2021 9:45 AM Performed by: Jerrye Noble, CRNA Pre-anesthesia Checklist: Patient identified, Emergency Drugs available, Suction available and Patient being monitored Patient Re-evaluated:Patient Re-evaluated prior to induction Oxygen Delivery Method: Supernova nasal CPAP

## 2021-01-28 NOTE — Anesthesia Postprocedure Evaluation (Signed)
Anesthesia Post Note  Patient: Belinda Garrett  Procedure(s) Performed: CARDIOVERSION  Patient location: cardiovascular suite. Anesthesia Type: General Level of consciousness: awake and alert Pain management: pain level controlled Vital Signs Assessment: post-procedure vital signs reviewed and stable Respiratory status: spontaneous breathing, nonlabored ventilation and respiratory function stable Cardiovascular status: blood pressure returned to baseline and stable Postop Assessment: no apparent nausea or vomiting Anesthetic complications: no   No notable events documented.   Last Vitals:  Vitals:   01/28/21 1031 01/28/21 1045  BP: 92/69 (!) 122/55  Pulse: 77   Resp: 19 20  Temp:    SpO2: 96% 95%    Last Pain:  Vitals:   01/28/21 1045  TempSrc:   PainSc: 0-No pain                 Iran Ouch

## 2021-01-28 NOTE — Anesthesia Preprocedure Evaluation (Addendum)
Anesthesia Evaluation  Patient identified by MRN, date of birth, ID band Patient awake    Reviewed: Allergy & Precautions, H&P , NPO status , Patient's Chart, lab work & pertinent test results  Airway Mallampati: III  TM Distance: >3 FB Neck ROM: Full    Dental no notable dental hx. (+) Teeth Intact, Dental Advisory Given   Pulmonary shortness of breath and with exertion, sleep apnea and Continuous Positive Airway Pressure Ventilation ,    Pulmonary exam normal breath sounds clear to auscultation       Cardiovascular Exercise Tolerance: Poor hypertension, Pt. on medications + Orthopnea and + DOE  + dysrhythmias Atrial Fibrillation  Rhythm:Irregular Rate:Normal + Peripheral Edema Chronic lymphedema   Neuro/Psych PSYCHIATRIC DISORDERS Anxiety Depression H/o meningioma  Neuromuscular disease (Peripheral Nueropathy)    GI/Hepatic Neg liver ROS, GERD  Controlled,  Endo/Other  Morbid obesity  Renal/GU negative Renal ROS  negative genitourinary   Musculoskeletal  (+) Arthritis , Osteoarthritis,    Abdominal (+) + obese,   Peds  Hematology  (+) anemia ,   Anesthesia Other Findings   Reproductive/Obstetrics negative OB ROS                            Anesthesia Physical  Anesthesia Plan  ASA: 4  Anesthesia Plan: General   Post-op Pain Management:    Induction: Intravenous  PONV Risk Score and Plan: 3 and Treatment may vary due to age or medical condition  Airway Management Planned: Natural Airway and Simple Face Mask  Additional Equipment:   Intra-op Plan:   Post-operative Plan:   Informed Consent: I have reviewed the patients History and Physical, chart, labs and discussed the procedure including the risks, benefits and alternatives for the proposed anesthesia with the patient or authorized representative who has indicated his/her understanding and acceptance.     Dental advisory  given  Plan Discussed with: CRNA and Anesthesiologist  Anesthesia Plan Comments:        Anesthesia Quick Evaluation

## 2021-01-28 NOTE — Transfer of Care (Signed)
Immediate Anesthesia Transfer of Care Note  Patient: Belinda Garrett  Procedure(s) Performed: CARDIOVERSION  Patient Location: PACU and Special Receoveries  Anesthesia Type:General  Level of Consciousness: drowsy and patient cooperative  Airway & Oxygen Therapy: Patient Spontanous Breathing and Patient connected to face mask oxygen  Post-op Assessment: Report given to RN and Post -op Vital signs reviewed and stable  Post vital signs: Reviewed and stable  Last Vitals:  Vitals Value Taken Time  BP 91/69 01/28/21 1000  Temp    Pulse 71 01/28/21 1003  Resp 26 01/28/21 1003  SpO2 100 % 01/28/21 1003  Vitals shown include unvalidated device data.  Last Pain:  Vitals:   01/28/21 0844  TempSrc: Oral  PainSc: 0-No pain         Complications: No notable events documented.

## 2021-01-28 NOTE — CV Procedure (Signed)
Cardioversion procedure note For atrial fibrillation, persistent  Procedure Details:  Consent: Risks of procedure as well as the alternatives and risks of each were explained to the (patient/caregiver).  Consent for procedure obtained.  Time Out: Verified patient identification, verified procedure, site/side was marked, verified correct patient position, special equipment/implants available, medications/allergies/relevent history reviewed, required imaging and test results available.  Performed  Patient placed on cardiac monitor, pulse oximetry, supplemental oxygen as necessary.   Sedation given: propofol IV, Dr.Johnson  Pacer pads placed anterior and posterior chest. Pads changed anterior lateral as detailed below   Cardioverted 5 time(s).   Cardioverted at anterior and posterior pads 150 J, 200 J, 200 J with manual compression.  Remained in atrial fibrillation Pads placed anterior lateral , 200 J , 200 J with manual compression  synchronized biphasic Converted to NSR on final attempt   Evaluation: Findings: Post procedure EKG shows: NSR Complications: None Patient did tolerate procedure well.  Time Spent Directly with the Patient:  28 minutes   Esmond Plants, M.D., Ph.D.

## 2021-01-30 ENCOUNTER — Encounter: Payer: Self-pay | Admitting: Cardiovascular Disease

## 2021-02-22 NOTE — Progress Notes (Signed)
Electrophysiology Office Follow up Visit Note:    Date:  02/23/2021   ID:  AILED DEFIBAUGH, DOB 1971-01-24, MRN [id number]  PCP:  McLean-Scocuzza, Nino Glow, MD  [facility] Cardiologist:  None  [facility] Electrophysiologist:  Vickie Epley, MD    Interval History:    TOPEKA GIAMMONA is a 50 y.o. female who presents for a follow up visit. I last saw her 8/17 in person and 10/5 via video visit.   At the last visit we planned to check an ECG and schedule a DCCV if she was still in AF. Eliquis was planned. On 11/11 she had an ECG showing sinus rhythm.    Past Medical History:  Diagnosis Date   Abnormal menses    Allergy    Anxiety    Arthritis    Back pain    Back pain    Chicken pox    Depression    Hypertension    Joint pain    Joint pain    Lymphedema    Palpitations    Peripheral neuropathy 09/30/2018   Pre-diabetes    Prediabetes    Rosacea    Seasonal allergies    Sleep apnea    Stasis dermatitis of both legs    Vertigo     Past Surgical History:  Procedure Laterality Date   APPLICATION OF CRANIAL NAVIGATION N/A 09/28/2020   Procedure: APPLICATION OF CRANIAL NAVIGATION;  Surgeon: Judith Part, MD;  Location: [place];  Service: Neurosurgery;  Laterality: N/A;   CARDIOVERSION N/A 01/28/2021   Procedure: CARDIOVERSION;  Surgeon: Minna Merritts, MD;  Location: [facility];  Service: Cardiovascular;  Laterality: N/A;   CRANIOTOMY Left 09/28/2020   Procedure: Left Occipital craniotomy for tumor resection;  Surgeon: Judith Part, MD;  Location: [place];  Service: Neurosurgery;  Laterality: Left;  RM 20   WISDOM TOOTH EXTRACTION      Current Medications: Current Meds  Medication Sig   Alpha-Lipoic Acid 600 MG TABS Take 600 mg by mouth daily.   apixaban (ELIQUIS) 5 MG TABS tablet Take 1 tablet by mouth 2 times daily.   Cholecalciferol (VITAMIN D3) 50 MCG (2000 UT) capsule Take 2,000 Units by mouth daily.   FLUoxetine (PROZAC) 40 MG capsule Take 1  capsule (40 mg total) by mouth daily.   gabapentin (NEURONTIN) 400 MG capsule Take 1 capsule (400 mg total) by mouth at bedtime for 90 days   glucosamine-chondroitin 500-400 MG tablet Take 1 tablet by mouth daily.   hydrochlorothiazide (HYDRODIURIL) 25 MG tablet TAKE 1 TABLET BY MOUTH DAILY IN MORNING   lisinopril (ZESTRIL) 30 MG tablet TAKE 1 TABLET (30 MG TOTAL) BY MOUTH DAILY.   loratadine (CLARITIN) 10 MG tablet Take 10 mg by mouth daily.   medroxyPROGESTERone (PROVERA) 2.5 MG tablet TAKE 1 TABLET BY MOUTH DAILY   metFORMIN (GLUCOPHAGE) 500 MG tablet TAKE 1 TABLET (500 MG TOTAL) BY MOUTH DAILY WITH BREAKFAST.   metoprolol succinate (TOPROL XL) 25 MG 24 hr tablet Take 0.5 tablets (12.5 mg total) by mouth daily.   metroNIDAZOLE (METROGEL) 1 % gel Apply to face daily   Multiple Vitamin (MULTIVITAMIN) capsule Take 1 capsule by mouth daily.   Omega-3 Fatty Acids (FISH OIL) 1000 MG CAPS Take 1,000 mg by mouth daily.     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[place]  Tobacco Use   Smoking status: Never   Smokeless tobacco: Never  Vaping Use   Vaping Use: Never used  Substance and Sexual Activity   Alcohol use: Not Currently    Alcohol/week: 0.0 standard drinks   Drug use: Not Currently   Sexual activity: Never  Other Topics Concern   Not on file  Social History Narrative   Works for [employer] from home    No kids    No guns    Wears seat belt    Safe in relationship    Social Determinants of Radio broadcast assistant Strain: Not on file  Food Insecurity: Not on file  Transportation Needs: Not on file  Physical Activity: Not on file  Stress: Not on file  Social Connections:  Not on file     Family History: The patient's family history includes Alcohol abuse in her maternal grandfather; Arthritis in her maternal grandfather, maternal grandmother, mother, paternal grandfather, and paternal grandmother; Dementia in her father; Depression in her father, maternal grandfather, mother, and paternal grandfather; Hearing loss in her maternal grandfather; Heart disease in her maternal grandfather, maternal grandmother, and paternal grandfather; Hyperlipidemia in her maternal grandmother; Hypertension in her maternal grandmother, mother, and paternal grandfather; Obesity in her father and mother; Parkinson's disease in her father; Stroke in her maternal grandfather, maternal grandmother, and paternal grandfather; Thyroid disease in her mother. There is no history of Breast cancer.  ROS:   Please see the history of present illness.    All other systems reviewed and are negative.  EKGs/Labs/Other Studies Reviewed:    The following studies were reviewed today:   EKG:  The ekg ordered today demonstrates sinus rhythm. Low voltage QRS  Recent Labs: 01/20/2021: ALT 16; BUN 16; Creatinine, Ser 0.93; Hemoglobin 10.6; Magnesium 2.1; Platelets 281.0; Potassium 4.3; Sodium 139; TSH 1.55  Recent Lipid Panel    Component Value Date/Time   CHOL 173 01/20/2021 0844   CHOL 180 04/29/2020 1015   TRIG 136.0 01/20/2021 0844   HDL 36.90 (L) 01/20/2021 0844   HDL 38 (L) 04/29/2020 1015   CHOLHDL 5 01/20/2021 0844   VLDL 27.2 01/20/2021 0844   LDLCALC 109 (H) 01/20/2021 0844   LDLCALC 122 (H) 04/29/2020 1015    Physical Exam:    VS:  BP 130/74   Pulse 85   Ht 5\' 1"  (1.549 m)   Wt (!) 457 lb (207.3 kg)   SpO2 98%   BMI 86.35 kg/m     Wt Readings from Last 3 Encounters:  02/23/21 (!) 457 lb (207.3 kg)  01/28/21 (!) 458 lb (207.7 kg)  01/20/21 (!) 459 lb (208.2 kg)     GEN: Well nourished, well developed in no acute distress. Morbidly obese. HEENT: Normal NECK: No JVD; No  carotid bruits LYMPHATICS: No lymphadenopathy CARDIAC: RRR, no murmurs, rubs, gallops RESPIRATORY:  Clear to auscultation without rales, wheezing or rhonchi  ABDOMEN: Soft, non-tender, non-distended MUSCULOSKELETAL:  No edema; No deformity  SKIN: Warm and dry NEUROLOGIC:  Alert and oriented x 3 PSYCHIATRIC:  Normal affect        ASSESSMENT:    1. Persistent atrial fibrillation ([place])   2. Brain tumor ([place])   3. Primary hypertension   4. Morbid obesity ([place])    PLAN:  In order of problems listed above:   #Persistent atrial fibrillation Maintaining sinus rhythm after her recent cardioversion Continue Eliquis for now.  Her CHA2DS2-VASc is at least 2 for hypertension and gender. Continue metoprolol  #Hypertension Controlled Continue current regimen  #Morbid obesity Weight loss encouraged.  This is contributing to her A. fib.   Follow-up 1 year with APP or sooner as needed.  Medication Adjustments/Labs and Tests Ordered: Current medicines are reviewed at length with the patient today.  Concerns regarding medicines are outlined above.  Orders Placed This Encounter  Procedures   EKG 12-Lead    No orders of the defined types were placed in this encounter.    Signed, Lars Mage, MD, [facility] 02/23/2021 8:13 AM    Electrophysiology [facility]

## 2021-02-23 ENCOUNTER — Other Ambulatory Visit: Payer: Self-pay

## 2021-02-23 ENCOUNTER — Ambulatory Visit (INDEPENDENT_AMBULATORY_CARE_PROVIDER_SITE_OTHER): Payer: 59 | Admitting: Cardiology

## 2021-02-23 ENCOUNTER — Encounter: Payer: Self-pay | Admitting: Cardiology

## 2021-02-23 VITALS — BP 130/74 | HR 85 | Ht 61.0 in | Wt >= 6400 oz

## 2021-02-23 DIAGNOSIS — D496 Neoplasm of unspecified behavior of brain: Secondary | ICD-10-CM

## 2021-02-23 DIAGNOSIS — I4819 Other persistent atrial fibrillation: Secondary | ICD-10-CM | POA: Diagnosis not present

## 2021-02-23 DIAGNOSIS — I10 Essential (primary) hypertension: Secondary | ICD-10-CM

## 2021-02-23 NOTE — Patient Instructions (Addendum)
Medication Instructions:  Your physician recommends that you continue on your current medications as directed. Please refer to the Current Medication list given to you today. *If you need a refill on your cardiac medications before your next appointment, please call your pharmacy*  Lab Work: None ordered. If you have labs (blood work) drawn today and your tests are completely normal, you will receive your results only by: [place] (if you have MyChart) OR A paper copy in the mail If you have any lab test that is abnormal or we need to change your treatment, we will call you to review the results.  Testing/Procedures: None ordered.  Follow-Up: At [facility], you and your health needs are our priority.  As part of our continuing mission to provide you with exceptional heart care, we have created designated Provider Care Teams.  These Care Teams include your primary Cardiologist (physician) and Advanced Practice Providers (APPs -  Physician Assistants and Nurse Practitioners) who all work together to provide you with the care you need, when you need it.  Your next appointment:   Your physician wants you to follow-up in: one year with one of the following Advanced Practice Providers on your designated Care Team:   Belinda Hodgkins, NP Belinda Faith, PA-C Belinda Mood, PA-C Belinda Kathlen Mody, PA-C You will receive a reminder letter in the mail two months in advance. If you don't receive a letter, please call our office to schedule the follow-up appointment.

## 2021-03-07 ENCOUNTER — Other Ambulatory Visit (HOSPITAL_COMMUNITY): Payer: Self-pay

## 2021-03-07 ENCOUNTER — Other Ambulatory Visit: Payer: Self-pay | Admitting: Certified Nurse Midwife

## 2021-03-07 MED FILL — Lisinopril Tab 30 MG: ORAL | 90 days supply | Qty: 90 | Fill #2 | Status: AC

## 2021-03-14 NOTE — H&P (Addendum)
H&P Addendum, pre-cardioversion  Patient was seen and evaluated prior to -cardioversion procedure Symptoms, prior testing details again confirmed with the patient Patient examined, no significant change from prior exam Lab work reviewed in detail personally by myself Patient understands risk and benefit of the procedure,  The risks (stroke, cardiac arrhythmias rarely resulting in the need for a temporary or permanent pacemaker, skin irritation or burns and complications associated with conscious sedation including aspiration, arrhythmia, respiratory failure and death), benefits (restoration of normal sinus rhythm) and alternatives of a direct current cardioversion were explained in detail Patient willing to proceed.  Signed, Esmond Plants, MD, Ph.D [facility]      History and Physical  ID:  Belinda Garrett, DOB [birth year], MRN [id number]   PCP:  McLean-Scocuzza, Nino Glow, MD          [facility] Cardiologist:  None  [facility] Electrophysiologist:  Vickie Epley, MD      Interval History:     Belinda Garrett is a 50 y.o. female  With persistent atrial fibrillation  Cardioversion scheduled by Dr. Quentin Ore to atrial fibrillation          Past Medical History:  Diagnosis Date   Abnormal menses     Allergy     Anxiety     Arthritis     Back pain     Back pain     Chicken pox     Depression     Hypertension     Joint pain     Joint pain     Lymphedema     Palpitations     Peripheral neuropathy 09/30/2018   Pre-diabetes     Prediabetes     Rosacea     Seasonal allergies     Sleep apnea     Stasis dermatitis of both legs     Vertigo             Past Surgical History:  Procedure Laterality Date   APPLICATION OF CRANIAL NAVIGATION N/A 09/28/2020    Procedure: APPLICATION OF CRANIAL NAVIGATION;  Surgeon: Judith Part, MD;  Location: [place];  Service: Neurosurgery;  Laterality: N/A;   CARDIOVERSION N/A 01/28/2021    Procedure: CARDIOVERSION;   Surgeon: Minna Merritts, MD;  Location: [facility];  Service: Cardiovascular;  Laterality: N/A;   CRANIOTOMY Left 09/28/2020    Procedure: Left Occipital craniotomy for tumor resection;  Surgeon: Judith Part, MD;  Location: [place];  Service: Neurosurgery;  Laterality: Left;  RM 20   WISDOM TOOTH EXTRACTION          Current Medications: Active Medications      Current Meds  Medication Sig   Alpha-Lipoic Acid 600 MG TABS Take 600 mg by mouth daily.   apixaban (ELIQUIS) 5 MG TABS tablet Take 1 tablet by mouth 2 times daily.   Cholecalciferol (VITAMIN D3) 50 MCG (2000 UT) capsule Take 2,000 Units by mouth daily.   FLUoxetine (PROZAC) 40 MG capsule Take 1 capsule (40 mg total) by mouth daily.   gabapentin (NEURONTIN) 400 MG capsule Take 1 capsule (400 mg total) by mouth at bedtime for 90 days   glucosamine-chondroitin 500-400 MG tablet Take 1 tablet by mouth daily.   hydrochlorothiazide (HYDRODIURIL) 25 MG tablet TAKE 1 TABLET BY MOUTH DAILY IN MORNING   lisinopril (ZESTRIL) 30 MG tablet TAKE 1 TABLET (30 MG TOTAL) BY MOUTH DAILY.   loratadine (CLARITIN) 10 MG tablet Take 10 mg by mouth daily.  medroxyPROGESTERone (PROVERA) 2.5 MG tablet TAKE 1 TABLET BY MOUTH DAILY   metFORMIN (GLUCOPHAGE) 500 MG tablet TAKE 1 TABLET (500 MG TOTAL) BY MOUTH DAILY WITH BREAKFAST.   metoprolol succinate (TOPROL XL) 25 MG 24 hr tablet Take 0.5 tablets (12.5 mg total) by mouth daily.   metroNIDAZOLE (METROGEL) 1 % gel Apply to face daily   Multiple Vitamin (MULTIVITAMIN) capsule Take 1 capsule by mouth daily.   Omega-3 Fatty Acids (FISH OIL) 1000 MG CAPS Take 1,000 mg by mouth daily.        Allergies:   Dust mite extract, Latex, and Pollen extract    Social History         Socioeconomic History   Marital status: Single      Spouse name: Not on file   Number of children: Not on file   Years of education: Not on file   Highest education level: Not on file  Occupational History   Occupation:  work from home, Designer, jewellery - [place]  Tobacco Use   Smoking status: Never   Smokeless tobacco: Never  Vaping Use   Vaping Use: Never used  Substance and Sexual Activity   Alcohol use: Not Currently      Alcohol/week: 0.0 standard drinks   Drug use: Not Currently   Sexual activity: Never  Other Topics Concern   Not on file  Social History Narrative    Works for [employer] from home     No kids     No guns     Wears seat belt     Safe in relationship     Social Determinants of Adult nurse Strain: Not on file  Food Insecurity: Not on file  Transportation Needs: Not on file  Physical Activity: Not on file  Stress: Not on file  Social Connections: Not on file      Family History: The patient's family history includes Alcohol abuse in her maternal grandfather; Arthritis in her maternal grandfather, maternal grandmother, mother, paternal grandfather, and paternal grandmother; Dementia in her father; Depression in her father, maternal grandfather, mother, and paternal grandfather; Hearing loss in her maternal grandfather; Heart disease in her maternal grandfather, maternal grandmother, and paternal grandfather; Hyperlipidemia in her maternal grandmother; Hypertension in her maternal grandmother, mother, and paternal grandfather; Obesity in her father and mother; Parkinson's disease in her father; Stroke in her maternal grandfather, maternal grandmother, and paternal grandfather; Thyroid disease in her mother. There is no history of Breast cancer.   ROS:   Please see the history of present illness.    All other systems reviewed and are negative.   EKGs/Labs/Other Studies Reviewed:     The following studies were reviewed today:     EKG:  The ekg ordered today demonstrates atrial fibrillation   Recent Labs: 01/20/2021: ALT 16; BUN 16; Creatinine, Ser 0.93; Hemoglobin 10.6; Magnesium 2.1; Platelets 281.0; Potassium 4.3; Sodium 139; TSH 1.55  Recent Lipid  Panel Labs (Brief)          Component Value Date/Time    CHOL 173 01/20/2021 0844    CHOL 180 04/29/2020 1015    TRIG 136.0 01/20/2021 0844    HDL 36.90 (L) 01/20/2021 0844    HDL 38 (L) 04/29/2020 1015    CHOLHDL 5 01/20/2021 0844    VLDL 27.2 01/20/2021 0844    LDLCALC 109 (H) 01/20/2021 0844    LDLCALC 122 (H) 04/29/2020 1015        Physical Exam:  VS:  BP 130/74    Pulse 85    Ht 5\' 1"  (1.549 m)    Wt (!) 457 lb (207.3 kg)    SpO2 98%    BMI 86.35 kg/m         Wt Readings from Last 3 Encounters:  02/23/21 (!) 457 lb (207.3 kg)  01/28/21 (!) 458 lb (207.7 kg)  01/20/21 (!) 459 lb (208.2 kg)      GEN: Well nourished, well developed in no acute distress. Morbidly obese. HEENT: Normal NECK: No JVD; No carotid bruits LYMPHATICS: No lymphadenopathy CARDIAC: RRR, no murmurs, rubs, gallops RESPIRATORY:  Clear to auscultation without rales, wheezing or rhonchi  ABDOMEN: Soft, non-tender, non-distended MUSCULOSKELETAL:  No edema; No deformity  SKIN: Warm and dry NEUROLOGIC:  Alert and oriented x 3 PSYCHIATRIC:  Normal affect            Assessment     ASSESSMENT:     1. Persistent atrial fibrillation ([place])   2. Brain tumor ([place])   3. Primary hypertension   4. Morbid obesity ([place])     PLAN:     In order of problems listed above:     #Persistent atrial fibrillation Cardioversion scheduled 01/28/21 Continue Eliquis for now.  Her CHA2DS2-VASc is at least 2 for hypertension and gender. Continue metoprolol  #Hypertension Controlled Continue current regimen  Signed, Esmond Plants, MD, Ph.D [facility]

## 2021-03-16 ENCOUNTER — Other Ambulatory Visit (HOSPITAL_COMMUNITY): Payer: Self-pay

## 2021-03-16 ENCOUNTER — Other Ambulatory Visit: Payer: Self-pay | Admitting: Certified Nurse Midwife

## 2021-03-16 NOTE — Telephone Encounter (Signed)
Pt is calling in stating that she is no longer using [facility] in [place], [place]  pt stated that she would like to have Rx medroxyprogesterone (PROVERA) 2.5 MG transferred to pharmacy. Pharm:  [facility].   **Change in Pharmacy pt stated that she is going to be only using [facility].

## 2021-03-17 ENCOUNTER — Other Ambulatory Visit: Payer: Self-pay

## 2021-03-17 ENCOUNTER — Other Ambulatory Visit (HOSPITAL_COMMUNITY): Payer: Self-pay

## 2021-03-17 MED ORDER — MEDROXYPROGESTERONE ACETATE 2.5 MG PO TABS
ORAL_TABLET | Freq: Every day | ORAL | 3 refills | Status: DC
  Filled 2021-03-17 – 2021-10-01 (×2): qty 90, 90d supply, fill #0

## 2021-03-17 NOTE — Telephone Encounter (Signed)
Rx sent 

## 2021-03-30 ENCOUNTER — Other Ambulatory Visit: Payer: Self-pay

## 2021-03-30 MED ORDER — ZOSTER VAC RECOMB ADJUVANTED 50 MCG/0.5ML IM SUSR
INTRAMUSCULAR | 1 refills | Status: DC
  Filled 2021-04-18: qty 1, 1d supply, fill #0
  Filled 2021-07-01: qty 1, 1d supply, fill #1

## 2021-04-05 ENCOUNTER — Other Ambulatory Visit (HOSPITAL_COMMUNITY): Payer: Self-pay

## 2021-04-18 ENCOUNTER — Other Ambulatory Visit: Payer: Self-pay

## 2021-04-19 ENCOUNTER — Other Ambulatory Visit: Payer: Self-pay

## 2021-04-19 ENCOUNTER — Other Ambulatory Visit (HOSPITAL_COMMUNITY): Payer: Self-pay

## 2021-04-19 ENCOUNTER — Other Ambulatory Visit: Payer: Self-pay | Admitting: Certified Nurse Midwife

## 2021-04-19 MED ORDER — MEDROXYPROGESTERONE ACETATE 2.5 MG PO TABS
ORAL_TABLET | Freq: Every day | ORAL | 2 refills | Status: DC
  Filled 2021-04-19: qty 90, 90d supply, fill #0
  Filled 2021-07-11: qty 90, 90d supply, fill #1
  Filled 2021-12-31: qty 90, 90d supply, fill #2

## 2021-04-19 MED FILL — Metformin HCl Tab 500 MG: ORAL | 90 days supply | Qty: 90 | Fill #3 | Status: AC

## 2021-04-20 ENCOUNTER — Other Ambulatory Visit (HOSPITAL_COMMUNITY): Payer: Self-pay

## 2021-04-27 ENCOUNTER — Ambulatory Visit: Payer: 59 | Admitting: Cardiology

## 2021-05-24 ENCOUNTER — Other Ambulatory Visit (HOSPITAL_COMMUNITY): Payer: Self-pay

## 2021-05-24 MED FILL — Hydrochlorothiazide Tab 25 MG: ORAL | 90 days supply | Qty: 90 | Fill #2 | Status: AC

## 2021-05-24 MED FILL — Lisinopril Tab 30 MG: ORAL | 90 days supply | Qty: 90 | Fill #3 | Status: AC

## 2021-06-10 ENCOUNTER — Encounter: Payer: Self-pay | Admitting: Cardiology

## 2021-06-10 DIAGNOSIS — G608 Other hereditary and idiopathic neuropathies: Secondary | ICD-10-CM | POA: Diagnosis not present

## 2021-06-10 DIAGNOSIS — I4819 Other persistent atrial fibrillation: Secondary | ICD-10-CM

## 2021-06-15 ENCOUNTER — Ambulatory Visit (INDEPENDENT_AMBULATORY_CARE_PROVIDER_SITE_OTHER): Payer: 59

## 2021-06-15 ENCOUNTER — Encounter: Payer: Self-pay | Admitting: Internal Medicine

## 2021-06-15 DIAGNOSIS — I4819 Other persistent atrial fibrillation: Secondary | ICD-10-CM

## 2021-06-16 ENCOUNTER — Other Ambulatory Visit (HOSPITAL_COMMUNITY): Payer: Self-pay

## 2021-06-16 NOTE — Telephone Encounter (Signed)
Please advise on weight loss options  ?

## 2021-06-18 DIAGNOSIS — I4819 Other persistent atrial fibrillation: Secondary | ICD-10-CM | POA: Diagnosis not present

## 2021-06-21 ENCOUNTER — Other Ambulatory Visit: Payer: Self-pay

## 2021-06-29 ENCOUNTER — Other Ambulatory Visit (HOSPITAL_COMMUNITY): Payer: Self-pay

## 2021-07-01 ENCOUNTER — Other Ambulatory Visit: Payer: Self-pay

## 2021-07-07 DIAGNOSIS — I4819 Other persistent atrial fibrillation: Secondary | ICD-10-CM | POA: Diagnosis not present

## 2021-07-08 ENCOUNTER — Encounter: Payer: Self-pay | Admitting: Internal Medicine

## 2021-07-11 ENCOUNTER — Other Ambulatory Visit: Payer: Self-pay | Admitting: Internal Medicine

## 2021-07-11 ENCOUNTER — Other Ambulatory Visit (HOSPITAL_COMMUNITY): Payer: Self-pay

## 2021-07-11 DIAGNOSIS — R7303 Prediabetes: Secondary | ICD-10-CM

## 2021-07-12 DIAGNOSIS — G608 Other hereditary and idiopathic neuropathies: Secondary | ICD-10-CM | POA: Diagnosis not present

## 2021-07-13 ENCOUNTER — Other Ambulatory Visit (HOSPITAL_COMMUNITY): Payer: Self-pay

## 2021-07-13 MED ORDER — METFORMIN HCL 500 MG PO TABS
ORAL_TABLET | Freq: Every day | ORAL | 3 refills | Status: DC
  Filled 2021-07-13: qty 90, 90d supply, fill #0
  Filled 2021-10-01: qty 90, 90d supply, fill #1

## 2021-07-17 ENCOUNTER — Other Ambulatory Visit: Payer: Self-pay | Admitting: Internal Medicine

## 2021-07-17 DIAGNOSIS — F419 Anxiety disorder, unspecified: Secondary | ICD-10-CM

## 2021-07-18 ENCOUNTER — Other Ambulatory Visit (HOSPITAL_COMMUNITY): Payer: Self-pay

## 2021-07-18 MED ORDER — FLUOXETINE HCL 40 MG PO CAPS
40.0000 mg | ORAL_CAPSULE | Freq: Every day | ORAL | 0 refills | Status: DC
  Filled 2021-07-18: qty 90, 90d supply, fill #0

## 2021-07-18 NOTE — Telephone Encounter (Signed)
PT NEEDS APPOINTMENT.

## 2021-07-26 NOTE — Progress Notes (Signed)
?Electrophysiology Office Follow up Visit Note:   ? ?Date:  07/27/2021  ? ?ID:  Belinda Garrett, DOB 1970-10-24, MRN [id number] ? ?PCP:  McLean-Scocuzza, Nino Glow, MD  ?[facility] Cardiologist:  None  ?[facility] [facility] Electrophysiologist:  Vickie Epley, MD  ? ? ?Interval History:   ? ?Belinda Garrett is a 51 y.o. female who presents for a follow up visit. They were last seen in clinic 02/23/2022 for persistent atrial fibrillation. She takes eliquis for stroke ppx. She recently wore a heart monitor for palpitations which showed only salvos of SVT. No AF was detected.  She tolerates her metoprolol 12.5 mg by mouth.  She notices that activity brings on her symptoms of palpitations. ? ? ?  ? ?Past Medical History:  ?Diagnosis Date  ? Abnormal menses   ? Allergy   ? Anxiety   ? Arthritis   ? Back pain   ? Back pain   ? Chicken pox   ? Depression   ? Hypertension   ? Joint pain   ? Joint pain   ? Lymphedema   ? Palpitations   ? Peripheral neuropathy 09/30/2018  ? Pre-diabetes   ? Prediabetes   ? Rosacea   ? Seasonal allergies   ? Sleep apnea   ? Stasis dermatitis of both legs   ? Vertigo   ? ? ?Past Surgical History:  ?Procedure Laterality Date  ? APPLICATION OF CRANIAL NAVIGATION N/A 09/28/2020  ? Procedure: APPLICATION OF CRANIAL NAVIGATION;  Surgeon: Judith Part, MD;  Location: [place];  Service: Neurosurgery;  Laterality: N/A;  ? CARDIOVERSION N/A 01/28/2021  ? Procedure: CARDIOVERSION;  Surgeon: Minna Merritts, MD;  Location: [facility];  Service: Cardiovascular;  Laterality: N/A;  ? CRANIOTOMY Left 09/28/2020  ? Procedure: Left Occipital craniotomy for tumor resection;  Surgeon: Judith Part, MD;  Location: [place];  Service: Neurosurgery;  Laterality: Left;  RM 20  ? WISDOM TOOTH EXTRACTION    ? ? ?Current Medications: ?Current Meds  ?Medication Sig  ? Alpha-Lipoic Acid 600 MG TABS Take 600 mg by mouth daily.  ? apixaban (ELIQUIS) 5 MG TABS tablet Take 1 tablet by mouth 2 times daily.  ?  Cholecalciferol (VITAMIN D3) 50 MCG (2000 UT) capsule Take 2,000 Units by mouth daily.  ? FLUoxetine (PROZAC) 40 MG capsule Take 1 capsule (40 mg total) by mouth daily.  ? gabapentin (NEURONTIN) 400 MG capsule Take 1 capsule (400 mg total) by mouth at bedtime for 90 days  ? glucosamine-chondroitin 500-400 MG tablet Take 1 tablet by mouth daily.  ? hydrochlorothiazide (HYDRODIURIL) 25 MG tablet TAKE 1 TABLET BY MOUTH DAILY IN MORNING  ? lisinopril (ZESTRIL) 30 MG tablet TAKE 1 TABLET (30 MG TOTAL) BY MOUTH DAILY.  ? loratadine (CLARITIN) 10 MG tablet Take 10 mg by mouth daily.  ? medroxyPROGESTERone (PROVERA) 2.5 MG tablet TAKE 1 TABLET BY MOUTH DAILY  ? medroxyPROGESTERone (PROVERA) 2.5 MG tablet TAKE 1 TABLET BY MOUTH DAILY  ? metFORMIN (GLUCOPHAGE) 500 MG tablet TAKE 1 TABLET (500 MG TOTAL) BY MOUTH DAILY WITH BREAKFAST.  ? metoprolol succinate (TOPROL XL) 25 MG 24 hr tablet Take 1/2 tablet (12.5 mg total) by mouth daily.  ? metroNIDAZOLE (METROGEL) 1 % gel Apply to face daily  ? Multiple Vitamin (MULTIVITAMIN) capsule Take 1 capsule by mouth daily.  ? Omega-3 Fatty Acids (FISH OIL) 1000 MG CAPS Take 1,000 mg by mouth daily.  ?  ? ?Allergies:   Dust mite extract, Latex, and  Pollen extract  ? ?Social History  ? ?Socioeconomic History  ? Marital status: Single  ?  Spouse name: Not on file  ? Number of children: Not on file  ? Years of education: Not on file  ? Highest education level: Not on file  ?Occupational History  ? Occupation: work from home, Designer, jewellery - [place]  ?Tobacco Use  ? Smoking status: Never  ? Smokeless tobacco: Never  ?Vaping Use  ? Vaping Use: Never used  ?Substance and Sexual Activity  ? Alcohol use: Not Currently  ?  Alcohol/week: 0.0 standard drinks  ? Drug use: Not Currently  ? Sexual activity: Never  ?Other Topics Concern  ? Not on file  ?Social History Narrative  ? Works for [employer] from home   ? No kids   ? No guns   ? Wears seat belt   ? Safe in relationship   ? ?Social  Determinants of Health  ? ?Financial Resource Strain: Not on file  ?Food Insecurity: Not on file  ?Transportation Needs: Not on file  ?Physical Activity: Not on file  ?Stress: Not on file  ?Social Connections: Not on file  ?  ? ?Family History: ?The patient's family history includes Alcohol abuse in her maternal grandfather; Arthritis in her maternal grandfather, maternal grandmother, mother, paternal grandfather, and paternal grandmother; Dementia in her father; Depression in her father, maternal grandfather, mother, and paternal grandfather; Hearing loss in her maternal grandfather; Heart disease in her maternal grandfather, maternal grandmother, and paternal grandfather; Hyperlipidemia in her maternal grandmother; Hypertension in her maternal grandmother, mother, and paternal grandfather; Obesity in her father and mother; Parkinson's disease in her father; Stroke in her maternal grandfather, maternal grandmother, and paternal grandfather; Thyroid disease in her mother. There is no history of Breast cancer. ? ?ROS:   ?Please see the history of present illness.    ?All other systems reviewed and are negative. ? ?EKGs/Labs/Other Studies Reviewed:   ? ?The following studies were reviewed today: ? ?07/11/2021 [facility] ?HR 54 - 203 bpm, average 82 bpm. ?36 SVT, longest 15 beats at an average rate of 120 bpm ?Symptom triggered episodes correspond to SVT. ?Rare supraventricular and ventricular ectopy. ?No sustained arrhythmias. ? ? ? ? ?Recent Labs: ?01/20/2021: ALT 16; BUN 16; Creatinine, Ser 0.93; Hemoglobin 10.6; Magnesium 2.1; Platelets 281.0; Potassium 4.3; Sodium 139; TSH 1.55  ?Recent Lipid Panel ?   ?Component Value Date/Time  ? CHOL 173 01/20/2021 0844  ? CHOL 180 04/29/2020 1015  ? TRIG 136.0 01/20/2021 0844  ? HDL 36.90 (L) 01/20/2021 0844  ? HDL 38 (L) 04/29/2020 1015  ? CHOLHDL 5 01/20/2021 0844  ? VLDL 27.2 01/20/2021 0844  ? [place] 109 (H) 01/20/2021 0844  ? LDLCALC 122 (H) 04/29/2020 1015  ? ? ?Physical Exam:    ? ?VS:  BP 128/76   Pulse 78   Ht 5' 1.5" (1.562 m)   Wt (!) 465 lb 3.2 oz (211 kg)   SpO2 96%   BMI 86.48 kg/m?    ? ?Wt Readings from Last 3 Encounters:  ?07/27/21 (!) 465 lb 3.2 oz (211 kg)  ?02/23/21 (!) 457 lb (207.3 kg)  ?01/28/21 (!) 458 lb (207.7 kg)  ?  ? ?GEN: Well nourished, well developed in no acute distress.  Obese.  In wheelchair ?HEENT: Normal ?NECK: No JVD; No carotid bruits ?LYMPHATICS: No lymphadenopathy ?CARDIAC: RRR, no murmurs, rubs, gallops ?RESPIRATORY:  Clear to auscultation without rales, wheezing or rhonchi  ?ABDOMEN: Soft, non-tender, non-distended ?MUSCULOSKELETAL:  No edema; No deformity  ?SKIN: Warm and dry ?NEUROLOGIC:  Alert and oriented x 3 ?PSYCHIATRIC:  Normal affect  ? ? ? ?  ? ?ASSESSMENT:   ? ?1. Paroxysmal atrial fibrillation (HCC)   ?2. Primary hypertension   ?3. OSA on CPAP   ?4. Meningioma ([place])   ? ?PLAN:   ? ?In order of problems listed above: ? ? ?#Persistent AF ?#SVT, likely atrial tachycardia ?Continue eliquis  ?Increase dose of metoprolol to '25mg'$  PO daily ? ?#OSA on CPAP ?Encourage continued CPAP use ? ?#Hypertension ?Controlled.  Continue current regimen including HCTZ, lisinopril, metoprolol ? ? ?Follow up 12 months with APP. ? ? ? ? ?Medication Adjustments/Labs and Tests Ordered: ?Current medicines are reviewed at length with the patient today.  Concerns regarding medicines are outlined above.  ?No orders of the defined types were placed in this encounter. ? ?No orders of the defined types were placed in this encounter. ? ? ? ?Signed, ?Lars Mage, MD, [facility], [place] ?07/27/2021 8:35 AM    ?Electrophysiology ?[place] ?

## 2021-07-27 ENCOUNTER — Other Ambulatory Visit (HOSPITAL_COMMUNITY): Payer: Self-pay

## 2021-07-27 ENCOUNTER — Ambulatory Visit (INDEPENDENT_AMBULATORY_CARE_PROVIDER_SITE_OTHER): Payer: 59 | Admitting: Cardiology

## 2021-07-27 ENCOUNTER — Encounter: Payer: Self-pay | Admitting: Cardiology

## 2021-07-27 VITALS — BP 128/76 | HR 78 | Ht 61.5 in | Wt >= 6400 oz

## 2021-07-27 DIAGNOSIS — D329 Benign neoplasm of meninges, unspecified: Secondary | ICD-10-CM | POA: Diagnosis not present

## 2021-07-27 DIAGNOSIS — Z9989 Dependence on other enabling machines and devices: Secondary | ICD-10-CM

## 2021-07-27 DIAGNOSIS — I480 Paroxysmal atrial fibrillation: Secondary | ICD-10-CM

## 2021-07-27 DIAGNOSIS — I10 Essential (primary) hypertension: Secondary | ICD-10-CM | POA: Diagnosis not present

## 2021-07-27 DIAGNOSIS — G4733 Obstructive sleep apnea (adult) (pediatric): Secondary | ICD-10-CM | POA: Diagnosis not present

## 2021-07-27 MED ORDER — METOPROLOL SUCCINATE ER 25 MG PO TB24
25.0000 mg | ORAL_TABLET | Freq: Every day | ORAL | 3 refills | Status: DC
  Filled 2021-07-27 – 2021-09-02 (×4): qty 90, 90d supply, fill #0
  Filled 2021-11-28: qty 90, 90d supply, fill #1

## 2021-07-27 NOTE — Addendum Note (Signed)
Addended by: Darrell Jewel on: 07/27/2021 08:43 AM ? ? Modules accepted: Orders ? ?

## 2021-07-27 NOTE — Patient Instructions (Signed)
Medications: ?Increase your Metoprolol succinate to 25 mg daily  ?Your physician recommends that you continue on your current medications as directed. Please refer to the Current Medication list given to you today. ?*If you need a refill on your cardiac medications before your next appointment, please call your pharmacy* ? ?Lab Work: ?None. ?If you have labs (blood work) drawn today and your tests are completely normal, you will receive your results only by: ?MyChart Message (if you have MyChart) OR ?A paper copy in the mail ?If you have any lab test that is abnormal or we need to change your treatment, we will call you to review the results. ? ?Testing/Procedures: ?None. ? ?Follow-Up: ?At [facility], you and your health needs are our priority.  As part of our continuing mission to provide you with exceptional heart care, we have created designated Provider Care Teams.  These Care Teams include your primary Cardiologist (physician) and Advanced Practice Providers (APPs -  Physician Assistants and Nurse Practitioners) who all work together to provide you with the care you need, when you need it. ? ?Your physician wants you to follow-up in: 12 months with one of the following Advanced Practice Providers on your designated Care Team:   ? ?Ignacia Bayley, NP ?[name] PA ?[name] PA ? ?  You will receive a reminder letter in the mail two months in advance. If you don't receive a letter, please call our office to schedule the follow-up appointment. ? ?We recommend signing up for the patient portal called "MyChart".  Sign up information is provided on this After Visit Summary.  MyChart is used to connect with patients for Virtual Visits (Telemedicine).  Patients are able to view lab/test results, encounter notes, upcoming appointments, etc.  Non-urgent messages can be sent to your provider as well.   ?To learn more about what you can do with MyChart, go to [email or web address].   ? ?Any Other Special  Instructions Will Be Listed Below (If Applicable).  ?

## 2021-08-10 ENCOUNTER — Ambulatory Visit: Payer: 59 | Admitting: Internal Medicine

## 2021-08-10 ENCOUNTER — Encounter: Payer: Self-pay | Admitting: Internal Medicine

## 2021-08-10 ENCOUNTER — Other Ambulatory Visit (HOSPITAL_COMMUNITY): Payer: Self-pay

## 2021-08-10 VITALS — BP 126/70 | HR 94 | Temp 98.4°F | Resp 14 | Ht 61.5 in | Wt >= 6400 oz

## 2021-08-10 DIAGNOSIS — F419 Anxiety disorder, unspecified: Secondary | ICD-10-CM

## 2021-08-10 DIAGNOSIS — E119 Type 2 diabetes mellitus without complications: Secondary | ICD-10-CM | POA: Diagnosis not present

## 2021-08-10 DIAGNOSIS — E785 Hyperlipidemia, unspecified: Secondary | ICD-10-CM | POA: Diagnosis not present

## 2021-08-10 DIAGNOSIS — Z6841 Body Mass Index (BMI) 40.0 and over, adult: Secondary | ICD-10-CM

## 2021-08-10 DIAGNOSIS — F32A Depression, unspecified: Secondary | ICD-10-CM | POA: Diagnosis not present

## 2021-08-10 DIAGNOSIS — I10 Essential (primary) hypertension: Secondary | ICD-10-CM | POA: Diagnosis not present

## 2021-08-10 LAB — COMPREHENSIVE METABOLIC PANEL
ALT: 22 U/L (ref 0–35)
AST: 16 U/L (ref 0–37)
Albumin: 4.1 g/dL (ref 3.5–5.2)
Alkaline Phosphatase: 62 U/L (ref 39–117)
BUN: 16 mg/dL (ref 6–23)
CO2: 27 mEq/L (ref 19–32)
Calcium: 9.3 mg/dL (ref 8.4–10.5)
Chloride: 101 mEq/L (ref 96–112)
Creatinine, Ser: 1 mg/dL (ref 0.40–1.20)
GFR: 64.8 mL/min (ref >60.00)
Glucose, Bld: 114 mg/dL — ABNORMAL HIGH (ref 70–99)
Potassium: 4.4 mEq/L (ref 3.5–5.1)
Sodium: 138 mEq/L (ref 135–145)
Total Bilirubin: 1 mg/dL (ref 0.2–1.2)
Total Protein: 6.4 g/dL (ref 6.0–8.3)

## 2021-08-10 LAB — HEMOGLOBIN A1C: Hgb A1c MFr Bld: 6.1 % (ref 4.6–6.5)

## 2021-08-10 LAB — CBC WITH DIFFERENTIAL/PLATELET
Basophils Absolute: 0 10*3/uL (ref 0.0–0.1)
Basophils Relative: 0.3 % (ref 0.0–3.0)
Eosinophils Absolute: 0.2 10*3/uL (ref 0.0–0.7)
Eosinophils Relative: 2.1 % (ref 0.0–5.0)
HCT: 35.5 % — ABNORMAL LOW (ref 36.0–46.0)
Hemoglobin: 11.5 g/dL — ABNORMAL LOW (ref 12.0–15.0)
Lymphocytes Relative: 19.9 % (ref 12.0–46.0)
Lymphs Abs: 1.9 10*3/uL (ref 0.7–4.0)
MCHC: 32.2 g/dL (ref 30.0–36.0)
MCV: 79.5 fl (ref 78.0–100.0)
Monocytes Absolute: 0.5 10*3/uL (ref 0.1–1.0)
Monocytes Relative: 5.7 % (ref 3.0–12.0)
Neutro Abs: 6.8 10*3/uL (ref 1.4–7.7)
Neutrophils Relative %: 72 % (ref 43.0–77.0)
Platelets: 232 10*3/uL (ref 150.0–400.0)
RBC: 4.47 Mil/uL (ref 3.87–5.11)
RDW: 16.9 % — ABNORMAL HIGH (ref 11.5–15.5)
WBC: 9.5 10*3/uL (ref 4.0–10.5)

## 2021-08-10 LAB — LIPID PANEL
Cholesterol: 175 mg/dL (ref 0–200)
HDL: 42.2 mg/dL (ref >39.00)
LDL Cholesterol: 111 mg/dL — ABNORMAL HIGH (ref 0–99)
NonHDL: 133.28
Total CHOL/HDL Ratio: 4
Triglycerides: 111 mg/dL (ref 0.0–149.0)
VLDL: 22.2 mg/dL (ref 0.0–40.0)

## 2021-08-10 MED ORDER — WEGOVY 1.7 MG/0.75ML ~~LOC~~ SOAJ
1.7000 mg | SUBCUTANEOUS | 0 refills | Status: DC
  Filled 2021-08-10: qty 3, fill #0

## 2021-08-10 MED ORDER — FLUOXETINE HCL 20 MG PO CAPS
60.0000 mg | ORAL_CAPSULE | Freq: Every day | ORAL | 3 refills | Status: DC
  Filled 2021-08-10: qty 90, 30d supply, fill #0
  Filled 2021-08-28: qty 90, 30d supply, fill #1
  Filled 2021-10-05: qty 90, 30d supply, fill #2
  Filled 2021-11-15: qty 90, 30d supply, fill #3

## 2021-08-10 MED ORDER — WEGOVY 0.25 MG/0.5ML ~~LOC~~ SOAJ
0.2500 mg | SUBCUTANEOUS | 0 refills | Status: DC
  Filled 2021-08-10: qty 2, 28d supply, fill #0

## 2021-08-10 MED ORDER — HYDROCHLOROTHIAZIDE 25 MG PO TABS
ORAL_TABLET | ORAL | 3 refills | Status: DC
Start: 2021-08-10 — End: 2021-12-06
  Filled 2021-08-10: qty 90, 90d supply, fill #0
  Filled 2021-11-15: qty 90, 90d supply, fill #1

## 2021-08-10 MED ORDER — WEGOVY 2.4 MG/0.75ML ~~LOC~~ SOAJ
2.4000 mg | SUBCUTANEOUS | 5 refills | Status: DC
  Filled 2021-08-10: qty 3, fill #0

## 2021-08-10 MED ORDER — LISINOPRIL 30 MG PO TABS
ORAL_TABLET | Freq: Three times a day (TID) | ORAL | 3 refills | Status: DC | PRN
  Filled 2021-08-10: qty 90, 90d supply, fill #0
  Filled 2021-11-15: qty 90, 90d supply, fill #1

## 2021-08-10 MED ORDER — WEGOVY 0.5 MG/0.5ML ~~LOC~~ SOAJ
0.5000 mg | SUBCUTANEOUS | 0 refills | Status: DC
  Filled 2021-08-10: qty 2, fill #0

## 2021-08-10 MED ORDER — WEGOVY 1 MG/0.5ML ~~LOC~~ SOAJ
1.0000 mg | SUBCUTANEOUS | 0 refills | Status: DC
  Filled 2021-08-10: qty 2, fill #0

## 2021-08-10 NOTE — Patient Instructions (Addendum)
[facility] 336 [id number] Let me know if mood is not better will add wellbutrin  Reschedule mammogram  Colonoscopy please do asap when ready   Semaglutide Injection (Weight Management) What is this medication? SEMAGLUTIDE (SEM a GLOO tide) promotes weight loss. It may also be used to maintain weight loss. It works by decreasing appetite. Changes to diet and exercise are often combined with this medication. This medicine may be used for other purposes; ask your health care provider or pharmacist if you have questions. COMMON BRAND NAME(S): ZOXWRU What should I tell my care team before I take this medication? They need to know if you have any of these conditions: Endocrine tumors (MEN 2) or if someone in your family had these tumors Eye disease, vision problems Gallbladder disease History of depression or mental health disease History of pancreatitis Kidney disease Stomach or intestine problems Suicidal thoughts, plans, or attempt; a previous suicide attempt by you or a family member Thyroid cancer or if someone in your family had thyroid cancer An unusual or allergic reaction to semaglutide, other medications, foods, dyes, or preservatives Pregnant or trying to get pregnant Breast-feeding How should I use this medication? This medication is injected under the skin. You will be taught how to prepare and give it. Take it as directed on the prescription label. It is given once every week (every 7 days). Keep taking it unless your care team tells you to stop. It is important that you put your used needles and pens in a special sharps container. Do not put them in a trash can. If you do not have a sharps container, call your pharmacist or care team to get one. A special MedGuide will be given to you by the pharmacist with each prescription and refill. Be sure to read this information carefully each time. This medication comes with INSTRUCTIONS FOR USE. Ask your pharmacist for directions on how to use  this medication. Read the information carefully. Talk to your pharmacist or care team if you have questions. Talk to your care team about the use of this medication in children. While it may be prescribed for children as young as 12 years for selected conditions, precautions do apply. Overdosage: If you think you have taken too much of this medicine contact a poison control center or emergency room at once. NOTE: This medicine is only for you. Do not share this medicine with others. What if I miss a dose? If you miss a dose and the next scheduled dose is more than 2 days away, take the missed dose as soon as possible. If you miss a dose and the next scheduled dose is less than 2 days away, do not take the missed dose. Take the next dose at your regular time. Do not take double or extra doses. If you miss your dose for 2 weeks or more, take the next dose at your regular time or call your care team to talk about how to restart this medication. What may interact with this medication? Insulin and other medications for diabetes This list may not describe all possible interactions. Give your health care provider a list of all the medicines, herbs, non-prescription drugs, or dietary supplements you use. Also tell them if you smoke, drink alcohol, or use illegal drugs. Some items may interact with your medicine. What should I watch for while using this medication? Visit your care team for regular checks on your progress. It may be some time before you see the benefit from this medication.  Drink plenty of fluids while taking this medication. Check with your care team if you have severe diarrhea, nausea, and vomiting, or if you sweat a lot. The loss of too much body fluid may make it dangerous for you to take this medication. This medication may affect blood sugar levels. Ask your care team if changes in diet or medications are needed if you have diabetes. If you or your family notice any changes in your  behavior, such as new or worsening depression, thoughts of harming yourself, anxiety, other unusual or disturbing thoughts, or memory loss, call your care team right away. Women should inform their care team if they wish to become pregnant or think they might be pregnant. Losing weight while pregnant is not advised and may cause harm to the unborn child. Talk to your care team for more information. What side effects may I notice from receiving this medication? Side effects that you should report to your care team as soon as possible: Allergic reactions--skin rash, itching, hives, swelling of the face, lips, tongue, or throat Change in vision Dehydration--increased thirst, dry mouth, feeling faint or lightheaded, headache, dark yellow or brown urine Gallbladder problems--severe stomach pain, nausea, vomiting, fever Heart palpitations--rapid, pounding, or irregular heartbeat Kidney injury--decrease in the amount of urine, swelling of the ankles, hands, or feet Pancreatitis--severe stomach pain that spreads to your back or gets worse after eating or when touched, fever, nausea, vomiting Thoughts of suicide or self-harm, worsening mood, feelings of depression Thyroid cancer--new mass or lump in the neck, pain or trouble swallowing, trouble breathing, hoarseness Side effects that usually do not require medical attention (report to your care team if they continue or are bothersome): Diarrhea Loss of appetite Nausea Stomach pain Vomiting This list may not describe all possible side effects. Call your doctor for medical advice about side effects. You may report side effects to FDA at 1-800-FDA-1088. Where should I keep my medication? Keep out of the reach of children and pets. Refrigeration (preferred): Store in the refrigerator. Do not freeze. Keep this medication in the original container until you are ready to take it. Get rid of any unused medication after the expiration date. Room temperature: If  needed, prior to cap removal, the pen can be stored at room temperature for up to 28 days. Protect from light. If it is stored at room temperature, get rid of any unused medication after 28 days or after it expires, whichever is first. It is important to get rid of the medication as soon as you no longer need it or it is expired. You can do this in two ways: Take the medication to a medication take-back program. Check with your pharmacy or law enforcement to find a location. If you cannot return the medication, follow the directions in the [facility]. NOTE: This sheet is a summary. It may not cover all possible information. If you have questions about this medicine, talk to your doctor, pharmacist, or health care provider.  2023 Elsevier/Gold Standard (2021-03-23 00:00:00)

## 2021-08-10 NOTE — Progress Notes (Signed)
Chief Complaint  Patient presents with   Follow-up    1 yr, disc about high stress level, feeling overwhelmed. Fasting this morning   F/u 1. Mood down and lack of interest due to weight though binge eating and stressing about moms health and her mom stated she wanted to start planning her funeral and after her dad did this he died 1.5 weeks later. Feeling stressed overwhelmed saw therapy at [facility] tried to reach out again not heard  On prozac 40  2. Morbid obesity with htn/hld and h/o dm now a1c predm range  prior on ozempic 0.5 but when on it had swollen glands in neck but around this time had large meningioma now removed advised pt not sure if related    Review of Systems  Constitutional:  Negative for weight loss.  HENT:  Negative for hearing loss.   Eyes:  Negative for blurred vision.  Respiratory:  Negative for shortness of breath.   Cardiovascular:  Negative for chest pain.  Gastrointestinal:  Negative for abdominal pain and blood in stool.  Genitourinary:  Negative for dysuria.  Musculoskeletal:  Negative for falls and joint pain.  Skin:  Negative for rash.  Neurological:  Negative for headaches.  Psychiatric/Behavioral:  Positive for depression. The patient is nervous/anxious.   Past Medical History:  Diagnosis Date   Abnormal menses    Allergy    Anxiety    Arthritis    Back pain    Back pain    Chicken pox    Depression    Hypertension    Joint pain    Joint pain    Lymphedema    Palpitations    Peripheral neuropathy 09/30/2018   Pre-diabetes    Prediabetes    Rosacea    Seasonal allergies    Sleep apnea    Stasis dermatitis of both legs    Vertigo    Past Surgical History:  Procedure Laterality Date   APPLICATION OF CRANIAL NAVIGATION N/A 09/28/2020   Procedure: APPLICATION OF CRANIAL NAVIGATION;  Surgeon: Judith Part, MD;  Location: [facility];  Service: Neurosurgery;  Laterality: N/A;   CARDIOVERSION N/A 01/28/2021   Procedure: CARDIOVERSION;   Surgeon: Minna Merritts, MD;  Location: [facility];  Service: Cardiovascular;  Laterality: N/A;   CRANIOTOMY Left 09/28/2020   Procedure: Left Occipital craniotomy for tumor resection;  Surgeon: Judith Part, MD;  Location: [facility];  Service: Neurosurgery;  Laterality: Left;  RM 43   WISDOM TOOTH EXTRACTION     Family History  Problem Relation Age of Onset   Arthritis Mother    Depression Mother    Hypertension Mother    Thyroid disease Mother    Obesity Mother    Depression Father    Parkinson's disease Father    Dementia Father    Obesity Father    Arthritis Maternal Grandmother    Heart disease Maternal Grandmother    Hypertension Maternal Grandmother    Hyperlipidemia Maternal Grandmother    Stroke Maternal Grandmother    Alcohol abuse Maternal Grandfather    Arthritis Maternal Grandfather    Depression Maternal Grandfather    Hearing loss Maternal Grandfather    Heart disease Maternal Grandfather    Stroke Maternal Grandfather    Arthritis Paternal Grandmother    Arthritis Paternal Grandfather    Depression Paternal Grandfather    Heart disease Paternal Grandfather    Hypertension Paternal Grandfather    Stroke Paternal Grandfather    Breast cancer Neg Hx  Social History   Socioeconomic History   Marital status: Single    Spouse name: Not on file   Number of children: Not on file   Years of education: Not on file   Highest education level: Not on file  Occupational History   Occupation: work from home, Designer, jewellery - [employer]  Tobacco Use   Smoking status: Never   Smokeless tobacco: Never  Vaping Use   Vaping Use: Never used  Substance and Sexual Activity   Alcohol use: Not Currently    Alcohol/week: 0.0 standard drinks   Drug use: Not Currently   Sexual activity: Never  Other Topics Concern   Not on file  Social History Narrative   Works for [employer] from home    No kids    No guns    Wears seat belt    Safe in relationship     Social Determinants of Health   Financial Resource Strain: Not on file  Food Insecurity: Not on file  Transportation Needs: Not on file  Physical Activity: Not on file  Stress: Not on file  Social Connections: Not on file  Intimate Partner Violence: Not on file   Current Meds  Medication Sig   Alpha-Lipoic Acid 600 MG TABS Take 600 mg by mouth daily.   apixaban (ELIQUIS) 5 MG TABS tablet Take 1 tablet by mouth 2 times daily.   Cholecalciferol (VITAMIN D3) 50 MCG (2000 UT) capsule Take 2,000 Units by mouth daily.   FLUoxetine (PROZAC) 20 MG capsule Take 3 capsules (60 mg total) by mouth daily. D/c 40   gabapentin (NEURONTIN) 400 MG capsule Take 1 capsule (400 mg total) by mouth at bedtime for 90 days   glucosamine-chondroitin 500-400 MG tablet Take 1 tablet by mouth daily.   hydrochlorothiazide (HYDRODIURIL) 25 MG tablet TAKE 1 TABLET BY MOUTH DAILY IN MORNING   lisinopril (ZESTRIL) 30 MG tablet TAKE 1 TABLET (30 MG TOTAL) BY MOUTH DAILY.   loratadine (CLARITIN) 10 MG tablet Take 10 mg by mouth daily.   medroxyPROGESTERone (PROVERA) 2.5 MG tablet TAKE 1 TABLET BY MOUTH DAILY   medroxyPROGESTERone (PROVERA) 2.5 MG tablet TAKE 1 TABLET BY MOUTH DAILY   metFORMIN (GLUCOPHAGE) 500 MG tablet TAKE 1 TABLET (500 MG TOTAL) BY MOUTH DAILY WITH BREAKFAST.   metoprolol succinate (TOPROL XL) 25 MG 24 hr tablet Take 1 tablet (25 mg total) by mouth daily.   metroNIDAZOLE (METROGEL) 1 % gel Apply to face daily   Multiple Vitamin (MULTIVITAMIN) capsule Take 1 capsule by mouth daily.   Omega-3 Fatty Acids (FISH OIL) 1000 MG CAPS Take 1,000 mg by mouth daily.   Semaglutide-Weight Management (WEGOVY) 0.25 MG/0.5ML SOAJ Inject 0.25 mg into the skin once a week.   Semaglutide-Weight Management (WEGOVY) 0.5 MG/0.5ML SOAJ Inject 0.5 mg into the skin once a week.   Semaglutide-Weight Management (WEGOVY) 1 MG/0.5ML SOAJ Inject 1 mg into the skin once a week.   Semaglutide-Weight Management (WEGOVY) 1.7  MG/0.75ML SOAJ Inject 1.7 mg into the skin once a week.   Semaglutide-Weight Management (WEGOVY) 2.4 MG/0.75ML SOAJ Inject 2.4 mg into the skin once a week.   [DISCONTINUED] FLUoxetine (PROZAC) 40 MG capsule Take 1 capsule (40 mg total) by mouth daily.   Allergies  Allergen Reactions   Dust Mite Extract     Sneezing, itchy, watery eyes   Latex     Tingling sensation   Pollen Extract     Sneezing, itchy, watery eyes   No results found  for this or any previous visit (from the past 2160 hour(s)). Objective  Body mass index is 87.33 kg/m. Wt Readings from Last 3 Encounters:  08/10/21 (!) 469 lb 12.8 oz (213.1 kg)  07/27/21 (!) 465 lb 3.2 oz (211 kg)  02/23/21 (!) 457 lb (207.3 kg)   Temp Readings from Last 3 Encounters:  08/10/21 98.4 F (36.9 C) (Oral)  01/28/21 98 F (36.7 C) (Oral)  01/20/21 98.4 F (36.9 C) (Oral)   BP Readings from Last 3 Encounters:  08/10/21 126/70  07/27/21 128/76  02/23/21 130/74   Pulse Readings from Last 3 Encounters:  08/10/21 94  07/27/21 78  02/23/21 85    Physical Exam Vitals and nursing note reviewed.  Constitutional:      Appearance: Normal appearance. She is well-developed and well-groomed.  HENT:     Head: Normocephalic and atraumatic.  Eyes:     Conjunctiva/sclera: Conjunctivae normal.     Pupils: Pupils are equal, round, and reactive to light.  Cardiovascular:     Rate and Rhythm: Normal rate and regular rhythm.     Heart sounds: Normal heart sounds. No murmur heard. Pulmonary:     Effort: Pulmonary effort is normal.     Breath sounds: Normal breath sounds.  Abdominal:     General: Abdomen is flat. Bowel sounds are normal.     Tenderness: There is no abdominal tenderness.  Musculoskeletal:        General: No tenderness.  Skin:    General: Skin is warm and dry.  Neurological:     General: No focal deficit present.     Mental Status: She is alert and oriented to person, place, and time. Mental status is at baseline.      Cranial Nerves: Cranial nerves 2-12 are intact.     Motor: Motor function is intact.     Coordination: Coordination is intact.     Gait: Gait is intact.  Psychiatric:        Attention and Perception: Attention and perception normal.        Mood and Affect: Mood and affect normal.        Speech: Speech normal.        Behavior: Behavior normal. Behavior is cooperative.        Thought Content: Thought content normal.        Cognition and Memory: Cognition and memory normal.        Judgment: Judgment normal.    Assessment  Plan  BMI 70 and over, adult ([place]) bmi 87.33 - Plan: Semaglutide-Weight Management (WEGOVY) 0.25 MG/0.5ML SOAJ, Semaglutide-Weight Management (WEGOVY) 0.5 MG/0.5ML SOAJ, Semaglutide-Weight Management (WEGOVY) 1 MG/0.5ML SOAJ, Semaglutide-Weight Management (WEGOVY) 1.7 MG/0.75ML SOAJ, Semaglutide-Weight Management (WEGOVY) 2.4 MG/0.75ML SOAJ  Type 2 diabetes mellitus without complication, without long-term current use of insulin (HCC) - Plan: Semaglutide-Weight Management (WEGOVY) 0.25 MG/0.5ML SOAJ, Semaglutide-Weight Management (WEGOVY) 0.5 MG/0.5ML SOAJ, Semaglutide-Weight Management (WEGOVY) 1 MG/0.5ML SOAJ, Semaglutide-Weight Management (WEGOVY) 1.7 MG/0.75ML SOAJ, Semaglutide-Weight Management (WEGOVY) 2.4 MG/0.75ML SOAJ, Hemoglobin A1c On metformin   Anxiety and depression On prozac 40 increase to 60  Call [facility] for appt  Consider add wellbutrin   Primary hypertension controlled - Plan: Semaglutide-Weight Management (WEGOVY) 0.25 MG/0.5ML SOAJ, Semaglutide-Weight Management (WEGOVY) 0.5 MG/0.5ML SOAJ, Semaglutide-Weight Management (WEGOVY) 1 MG/0.5ML SOAJ, Semaglutide-Weight Management (WEGOVY) 1.7 MG/0.75ML SOAJ, Semaglutide-Weight Management (WEGOVY) 2.4 MG/0.75ML SOAJ, Comprehensive metabolic panel, Lipid panel, CBC with Differential/Platelet  Hyperlipidemia, unspecified hyperlipidemia type - Plan: Semaglutide-Weight Management (WEGOVY) 0.25 MG/0.5ML SOAJ,  Semaglutide-Weight Management (WEGOVY) 0.5  MG/0.5ML SOAJ, Semaglutide-Weight Management (WEGOVY) 1 MG/0.5ML SOAJ, Semaglutide-Weight Management (WEGOVY) 1.7 MG/0.75ML SOAJ, Semaglutide-Weight Management (WEGOVY) 2.4 MG/0.75ML SOAJ, Lipid panel   HM Flu shot utd UTD Tdap  rec MMR and hep B vaccine pt to think about covid vx had 4/4 2/2 shingrix  rx prevnar   Declines STD testing   Pap 02/02/16 neg neg HPV f/u [facility] Dr. Chauncey Cruel changed to encompass [facility] CC encompass to see if needs to w/u DUB  Pap had 01/14/20 neg neg pap   09/14/17 mammogram neg referred previously overdue  Reschedule as of 01/20/21   Derm is Dr. Evorn Gong saw ln2 2020 seeing 09/2021    Colonoscopy consider in future age 39 y.o 02/2019 vs cologuard disc at f/u  -message me back when ready vs cologuard disc 12/16/19 and 06/16/20 declines  As of  08/10/21 declines   Rec healthy diet and exercise  D3 2000 IU qd   Provider: Dr. Olivia Mackie McLean-Scocuzza-Internal Medicine

## 2021-08-27 ENCOUNTER — Other Ambulatory Visit (HOSPITAL_COMMUNITY): Payer: Self-pay

## 2021-08-28 ENCOUNTER — Other Ambulatory Visit (HOSPITAL_COMMUNITY): Payer: Self-pay

## 2021-08-29 ENCOUNTER — Other Ambulatory Visit (HOSPITAL_COMMUNITY): Payer: Self-pay

## 2021-08-31 ENCOUNTER — Other Ambulatory Visit (HOSPITAL_COMMUNITY): Payer: Self-pay

## 2021-09-02 ENCOUNTER — Other Ambulatory Visit: Payer: Self-pay

## 2021-09-02 ENCOUNTER — Other Ambulatory Visit (HOSPITAL_COMMUNITY): Payer: Self-pay

## 2021-09-02 MED ORDER — METRONIDAZOLE 1 % EX GEL
Freq: Every day | CUTANEOUS | 5 refills | Status: DC
  Filled 2021-09-02 – 2021-09-06 (×2): qty 60, 30d supply, fill #0

## 2021-09-03 ENCOUNTER — Other Ambulatory Visit (HOSPITAL_COMMUNITY): Payer: Self-pay

## 2021-09-06 ENCOUNTER — Other Ambulatory Visit: Payer: Self-pay

## 2021-09-06 ENCOUNTER — Other Ambulatory Visit (HOSPITAL_COMMUNITY): Payer: Self-pay

## 2021-09-07 ENCOUNTER — Other Ambulatory Visit (HOSPITAL_COMMUNITY): Payer: Self-pay

## 2021-09-09 ENCOUNTER — Other Ambulatory Visit (HOSPITAL_COMMUNITY): Payer: Self-pay

## 2021-09-10 ENCOUNTER — Other Ambulatory Visit (HOSPITAL_COMMUNITY): Payer: Self-pay

## 2021-09-16 ENCOUNTER — Encounter: Payer: Self-pay | Admitting: Internal Medicine

## 2021-10-01 ENCOUNTER — Other Ambulatory Visit (HOSPITAL_COMMUNITY): Payer: Self-pay

## 2021-10-05 ENCOUNTER — Other Ambulatory Visit (HOSPITAL_COMMUNITY): Payer: Self-pay

## 2021-10-26 ENCOUNTER — Encounter (INDEPENDENT_AMBULATORY_CARE_PROVIDER_SITE_OTHER): Payer: Self-pay

## 2021-11-02 ENCOUNTER — Other Ambulatory Visit: Payer: Self-pay | Admitting: Neurological Surgery

## 2021-11-02 DIAGNOSIS — D496 Neoplasm of unspecified behavior of brain: Secondary | ICD-10-CM

## 2021-11-15 ENCOUNTER — Other Ambulatory Visit (HOSPITAL_COMMUNITY): Payer: Self-pay

## 2021-11-15 ENCOUNTER — Encounter: Payer: Self-pay | Admitting: Internal Medicine

## 2021-11-16 ENCOUNTER — Other Ambulatory Visit: Payer: Self-pay | Admitting: Internal Medicine

## 2021-11-16 DIAGNOSIS — F419 Anxiety disorder, unspecified: Secondary | ICD-10-CM

## 2021-11-16 MED ORDER — FLUOXETINE HCL 20 MG PO CAPS
60.0000 mg | ORAL_CAPSULE | Freq: Every day | ORAL | 3 refills | Status: DC
  Filled 2021-11-16 – 2021-12-12 (×2): qty 90, 30d supply, fill #0

## 2021-11-17 ENCOUNTER — Other Ambulatory Visit (HOSPITAL_COMMUNITY): Payer: Self-pay

## 2021-11-17 ENCOUNTER — Ambulatory Visit
Admission: RE | Admit: 2021-11-17 | Discharge: 2021-11-17 | Disposition: A | Discharge status: Home / Self Care | Payer: 59 | Source: Ambulatory Visit | Attending: Neurological Surgery | Admitting: Neurological Surgery

## 2021-11-17 DIAGNOSIS — D496 Neoplasm of unspecified behavior of brain: Secondary | ICD-10-CM

## 2021-11-17 DIAGNOSIS — G9389 Other specified disorders of brain: Secondary | ICD-10-CM | POA: Diagnosis not present

## 2021-11-17 DIAGNOSIS — C719 Malignant neoplasm of brain, unspecified: Secondary | ICD-10-CM | POA: Diagnosis not present

## 2021-11-17 MED ORDER — GADOBENATE DIMEGLUMINE 529 MG/ML IV SOLN
20.0000 mL | Freq: Once | INTRAVENOUS | Status: AC | PRN
  Administered 2021-11-17: 20 mL via INTRAVENOUS

## 2021-11-24 DIAGNOSIS — D496 Neoplasm of unspecified behavior of brain: Secondary | ICD-10-CM | POA: Diagnosis not present

## 2021-11-29 ENCOUNTER — Other Ambulatory Visit (HOSPITAL_COMMUNITY): Payer: Self-pay

## 2021-11-30 ENCOUNTER — Other Ambulatory Visit (HOSPITAL_COMMUNITY): Payer: Self-pay

## 2021-12-02 ENCOUNTER — Other Ambulatory Visit (HOSPITAL_COMMUNITY): Payer: Self-pay

## 2021-12-02 MED ORDER — GABAPENTIN 400 MG PO CAPS
400.0000 mg | ORAL_CAPSULE | Freq: Every evening | ORAL | 1 refills | Status: DC
  Filled 2021-12-02: qty 90, 90d supply, fill #0
  Filled 2022-02-17: qty 90, 90d supply, fill #1

## 2021-12-06 ENCOUNTER — Encounter: Payer: Self-pay | Admitting: Internal Medicine

## 2021-12-06 ENCOUNTER — Other Ambulatory Visit (HOSPITAL_COMMUNITY): Payer: Self-pay

## 2021-12-06 ENCOUNTER — Ambulatory Visit: Payer: 59 | Admitting: Internal Medicine

## 2021-12-06 VITALS — BP 138/80 | HR 83 | Temp 99.1°F | Ht 61.5 in | Wt >= 6400 oz

## 2021-12-06 DIAGNOSIS — J0110 Acute frontal sinusitis, unspecified: Secondary | ICD-10-CM

## 2021-12-06 DIAGNOSIS — H6983 Other specified disorders of Eustachian tube, bilateral: Secondary | ICD-10-CM

## 2021-12-06 DIAGNOSIS — Z1231 Encounter for screening mammogram for malignant neoplasm of breast: Secondary | ICD-10-CM | POA: Diagnosis not present

## 2021-12-06 DIAGNOSIS — I10 Essential (primary) hypertension: Secondary | ICD-10-CM | POA: Diagnosis not present

## 2021-12-06 DIAGNOSIS — R7303 Prediabetes: Secondary | ICD-10-CM | POA: Diagnosis not present

## 2021-12-06 DIAGNOSIS — R0981 Nasal congestion: Secondary | ICD-10-CM

## 2021-12-06 DIAGNOSIS — Z6841 Body Mass Index (BMI) 40.0 and over, adult: Secondary | ICD-10-CM

## 2021-12-06 DIAGNOSIS — Z23 Encounter for immunization: Secondary | ICD-10-CM | POA: Diagnosis not present

## 2021-12-06 DIAGNOSIS — D496 Neoplasm of unspecified behavior of brain: Secondary | ICD-10-CM

## 2021-12-06 DIAGNOSIS — R5383 Other fatigue: Secondary | ICD-10-CM

## 2021-12-06 DIAGNOSIS — R319 Hematuria, unspecified: Secondary | ICD-10-CM

## 2021-12-06 DIAGNOSIS — Z1211 Encounter for screening for malignant neoplasm of colon: Secondary | ICD-10-CM

## 2021-12-06 MED ORDER — FLUTICASONE PROPIONATE 50 MCG/ACT NA SUSP
2.0000 | Freq: Every day | NASAL | 2 refills | Status: DC | PRN
  Filled 2021-12-06: qty 16, 30d supply, fill #0
  Filled 2022-02-17: qty 16, 30d supply, fill #1
  Filled 2022-04-21: qty 16, 30d supply, fill #2

## 2021-12-06 MED ORDER — METOPROLOL SUCCINATE ER 25 MG PO TB24
25.0000 mg | ORAL_TABLET | Freq: Every day | ORAL | 3 refills | Status: DC
  Filled 2021-12-06 – 2022-02-17 (×2): qty 90, 90d supply, fill #0
  Filled 2022-05-21: qty 90, 90d supply, fill #1
  Filled 2022-08-29 (×2): qty 90, 90d supply, fill #2
  Filled 2022-11-22: qty 90, 90d supply, fill #3

## 2021-12-06 MED ORDER — METFORMIN HCL 500 MG PO TABS
ORAL_TABLET | Freq: Every day | ORAL | 3 refills | Status: DC
  Filled 2021-12-06: qty 90, fill #0
  Filled 2021-12-12 – 2021-12-31 (×2): qty 90, 90d supply, fill #0
  Filled 2022-04-21: qty 90, 90d supply, fill #1
  Filled 2022-07-17: qty 90, 90d supply, fill #2
  Filled 2022-09-18 – 2022-10-18 (×2): qty 90, 90d supply, fill #3

## 2021-12-06 MED ORDER — LISINOPRIL 30 MG PO TABS
30.0000 mg | ORAL_TABLET | Freq: Every day | ORAL | 3 refills | Status: DC
  Filled 2021-12-06 – 2022-02-17 (×3): qty 90, 90d supply, fill #0
  Filled 2022-05-18: qty 90, 90d supply, fill #1
  Filled 2022-08-14: qty 90, 90d supply, fill #2
  Filled 2022-11-05 – 2022-11-09 (×2): qty 90, 90d supply, fill #3

## 2021-12-06 MED ORDER — SALINE SPRAY 0.65 % NA SOLN
2.0000 | NASAL | 2 refills | Status: DC | PRN
  Filled 2021-12-06 – 2022-01-27 (×2): qty 30, fill #0

## 2021-12-06 MED ORDER — AZITHROMYCIN 250 MG PO TABS
ORAL_TABLET | ORAL | 0 refills | Status: AC
  Filled 2021-12-06: qty 6, 5d supply, fill #0

## 2021-12-06 MED ORDER — HYDROCHLOROTHIAZIDE 25 MG PO TABS
25.0000 mg | ORAL_TABLET | Freq: Every day | ORAL | 3 refills | Status: DC
  Filled 2021-12-06 – 2022-05-20 (×2): qty 90, 90d supply, fill #0
  Filled 2022-09-18: qty 90, 90d supply, fill #1

## 2021-12-06 NOTE — Progress Notes (Signed)
Chief Complaint  Patient presents with   Ear Fullness    For a week pts right ear feels full and when she yams she can feel it pop   F/u  1. Right ear popping feels full with yawning x 1-2 weeks and nasal congestion, pnd she tries claritin 10 mg qd   2. Obesity wegovy costs too much never filled working on healthy diet and exercise  3. Dm 2 with htn    Review of Systems  Constitutional:  Negative for weight loss.  HENT:  Negative for hearing loss.   Eyes:  Negative for blurred vision.  Respiratory:  Negative for shortness of breath.   Cardiovascular:  Negative for chest pain.  Gastrointestinal:  Negative for abdominal pain and blood in stool.  Genitourinary:  Negative for dysuria.  Musculoskeletal:  Negative for falls and joint pain.  Skin:  Negative for rash.  Neurological:  Negative for headaches.  Psychiatric/Behavioral:  Negative for depression.    Past Medical History:  Diagnosis Date   Abnormal menses    Allergy    Anxiety    Arthritis    Back pain    Back pain    Chicken pox    Depression    Hypertension    Joint pain    Joint pain    Lymphedema    Palpitations    Peripheral neuropathy 09/30/2018   Pre-diabetes    Prediabetes    Rosacea    Seasonal allergies    Sleep apnea    Stasis dermatitis of both legs    Vertigo    Past Surgical History:  Procedure Laterality Date   APPLICATION OF CRANIAL NAVIGATION N/A 09/28/2020   Procedure: APPLICATION OF CRANIAL NAVIGATION;  Surgeon: Judith Part, MD;  Location: [place];  Service: Neurosurgery;  Laterality: N/A;   CARDIOVERSION N/A 01/28/2021   Procedure: CARDIOVERSION;  Surgeon: Minna Merritts, MD;  Location: [facility];  Service: Cardiovascular;  Laterality: N/A;   CRANIOTOMY Left 09/28/2020   Procedure: Left Occipital craniotomy for tumor resection;  Surgeon: Judith Part, MD;  Location: [place];  Service: Neurosurgery;  Laterality: Left;  RM 17   WISDOM TOOTH EXTRACTION     Family History   Problem Relation Age of Onset   Arthritis Mother    Depression Mother    Hypertension Mother    Thyroid disease Mother    Obesity Mother    Depression Father    Parkinson's disease Father    Dementia Father    Obesity Father    Arthritis Maternal Grandmother    Heart disease Maternal Grandmother    Hypertension Maternal Grandmother    Hyperlipidemia Maternal Grandmother    Stroke Maternal Grandmother    Alcohol abuse Maternal Grandfather    Arthritis Maternal Grandfather    Depression Maternal Grandfather    Hearing loss Maternal Grandfather    Heart disease Maternal Grandfather    Stroke Maternal Grandfather    Arthritis Paternal Grandmother    Arthritis Paternal Grandfather    Depression Paternal Grandfather    Heart disease Paternal Grandfather    Hypertension Paternal Grandfather    Stroke Paternal Grandfather    Breast cancer Neg Hx    Social History   Socioeconomic History   Marital status: Single    Spouse name: Not on file   Number of children: Not on file   Years of education: Not on file   Highest education level: Not on file  Occupational History   Occupation: work  from home, vendor mgmt coordinator - [employer]  Tobacco Use   Smoking status: Never   Smokeless tobacco: Never  Vaping Use   Vaping Use: Never used  Substance and Sexual Activity   Alcohol use: Not Currently    Alcohol/week: 0.0 standard drinks of alcohol   Drug use: Not Currently   Sexual activity: Never  Other Topics Concern   Not on file  Social History Narrative   Works for [employer] from home    No kids    No guns    Wears seat belt    Safe in relationship    Social Determinants of Health   Financial Resource Strain: Not on file  Food Insecurity: Not on file  Transportation Needs: Not on file  Physical Activity: Not on file  Stress: Not on file  Social Connections: Not on file  Intimate Partner Violence: Not on file   Current Meds  Medication Sig   Alpha-Lipoic Acid 600  MG TABS Take 600 mg by mouth daily.   apixaban (ELIQUIS) 5 MG TABS tablet Take 1 tablet by mouth 2 times daily.   azithromycin (ZITHROMAX) 250 MG tablet Take 2 tablets on day 1, then 1 tablet daily on days 2 through 5. take with food   Cholecalciferol (VITAMIN D3) 50 MCG (2000 UT) capsule Take 2,000 Units by mouth daily.   FLUoxetine (PROZAC) 20 MG capsule Take 3 capsules by mouth daily. (stop 63m dose)   fluticasone (FLONASE) 50 MCG/ACT nasal spray Place 2 sprays into both nostrils daily as needed for allergies or rhinitis. Please deliver   gabapentin (NEURONTIN) 400 MG capsule Take 1 capsule (400 mg total) by mouth at bedtime.   glucosamine-chondroitin 500-400 MG tablet Take 1 tablet by mouth daily.   hydrochlorothiazide (HYDRODIURIL) 25 MG tablet TAKE 1 TABLET BY MOUTH DAILY IN MORNING   lisinopril (ZESTRIL) 30 MG tablet TAKE 1 TABLET BY MOUTH DAILY.   loratadine (CLARITIN) 10 MG tablet Take 10 mg by mouth daily.   medroxyPROGESTERone (PROVERA) 2.5 MG tablet TAKE 1 TABLET BY MOUTH DAILY   medroxyPROGESTERone (PROVERA) 2.5 MG tablet TAKE 1 TABLET BY MOUTH DAILY   metFORMIN (GLUCOPHAGE) 500 MG tablet TAKE 1 TABLET (500 MG TOTAL) BY MOUTH DAILY WITH BREAKFAST.   metoprolol succinate (TOPROL XL) 25 MG 24 hr tablet Take 1 tablet (25 mg total) by mouth daily.   metroNIDAZOLE (METROGEL) 1 % gel Apply to face daily   Multiple Vitamin (MULTIVITAMIN) capsule Take 1 capsule by mouth daily.   Omega-3 Fatty Acids (FISH OIL) 1000 MG CAPS Take 1,000 mg by mouth daily.   sodium chloride (OCEAN) 0.65 % SOLN nasal spray Place 2 sprays into both nostrils as needed for congestion. Please deliver   Allergies  Allergen Reactions   Dust Mite Extract     Sneezing, itchy, watery eyes   Latex     Tingling sensation   Pollen Extract     Sneezing, itchy, watery eyes   No results found for this or any previous visit (from the past 2160 hour(s)). Objective  Body mass index is 85.92 kg/m. Wt Readings from Last  3 Encounters:  12/06/21 (!) 462 lb 3.2 oz (209.7 kg)  08/10/21 (!) 469 lb 12.8 oz (213.1 kg)  07/27/21 (!) 465 lb 3.2 oz (211 kg)   Temp Readings from Last 3 Encounters:  12/06/21 99.1 F (37.3 C) (Oral)  08/10/21 98.4 F (36.9 C) (Oral)  01/28/21 98 F (36.7 C) (Oral)   BP Readings from Last 3  Encounters:  12/06/21 138/80  08/10/21 126/70  07/27/21 128/76   Pulse Readings from Last 3 Encounters:  12/06/21 83  08/10/21 94  07/27/21 78    Physical Exam Vitals and nursing note reviewed.  Constitutional:      Appearance: Normal appearance. She is well-developed and well-groomed.  HENT:     Head: Normocephalic and atraumatic.  Eyes:     Conjunctiva/sclera: Conjunctivae normal.     Pupils: Pupils are equal, round, and reactive to light.  Cardiovascular:     Rate and Rhythm: Normal rate and regular rhythm.     Heart sounds: Normal heart sounds. No murmur heard. Pulmonary:     Effort: Pulmonary effort is normal.     Breath sounds: Normal breath sounds.  Abdominal:     General: Abdomen is flat. Bowel sounds are normal.     Tenderness: There is no abdominal tenderness.  Musculoskeletal:        General: No tenderness.  Skin:    General: Skin is warm and dry.  Neurological:     General: No focal deficit present.     Mental Status: She is alert and oriented to person, place, and time. Mental status is at baseline.     Cranial Nerves: Cranial nerves 2-12 are intact.     Motor: Motor function is intact.     Coordination: Coordination is intact.     Gait: Gait is intact.  Psychiatric:        Attention and Perception: Attention and perception normal.        Mood and Affect: Mood and affect normal.        Speech: Speech normal.        Behavior: Behavior normal. Behavior is cooperative.        Thought Content: Thought content normal.        Cognition and Memory: Cognition and memory normal.        Judgment: Judgment normal.     Assessment  Plan  Dysfunction of both  eustachian tubes/sinusitis+nasal congestion - Plan: fluticasone (FLONASE) 50 MCG/ACT nasal spray, sodium chloride (OCEAN) 0.65 % SOLN nasal spray, azithromycin (ZITHROMAX) 250 MG tablet Cont claritin  Class 3 severe obesity with serious comorbidity and body mass index (BMI) greater than or equal to 70 in adult, unspecified obesity type (HCC) Morbid obesity with BMI of 70 and over, adult [facility]) Did not get wegovy due to cost trying healthy diet and exercise had seen wt loss clinic in the past   Brain tumor [facility]) Benign resected 1+ year ago [place] Dr. Dot Been will f/u in 1 year MRI MRI 10/2021 negative   Primary hypertension Prediabetes  BP sl elevated on lis 30 consider increase to 40 mg qd  Toprol xl25 and hctz 25 mg qd   HM Flu shot given today UTD Tdap  rec MMR and hep B vaccine pt to think about covid vx had 4/4 2/2 shingrix  Consider prevnar 20 in the future    Declines STD testing    Pap 02/02/16 neg neg HPV f/u [facility] Dr. Chauncey Cruel changed to encompass Philip Aspen CC encompass to see if needs to w/u DUB  Pap had 01/14/20 neg neg pap   09/14/17 mammogram neg referred previously overdue  Call to Reschedule as of 01/20/21 and 12/06/21   Derm is Dr. Evorn Gong saw ln2 2020 seeing 09/2021    Colonoscopy referral prev placed in 2023 pt needs to call to set up appt c/o loose stools in the am  Rec healthy diet and exercise   D3 2000 IU qd    Provider: Dr. Olivia Mackie McLean-Scocuzza-Internal Medicine

## 2021-12-06 NOTE — Patient Instructions (Addendum)
Call for mammogram call Belinda Garrett and schedule mammogram Call [facility] clinic GI and schedule colonoscopy   Phone Fax E-mail Address  [phone] Not available [place] [address]     Specialties     Gastroenterology       Eustachian Tube Dysfunction  Eustachian tube dysfunction refers to a condition in which a blockage develops in the narrow passage that connects the middle ear to the back of the nose (eustachian tube). The eustachian tube regulates air pressure in the middle ear by letting air move between the ear and nose. It also helps to drain fluid from the middle ear space. Eustachian tube dysfunction can affect one or both ears. When the eustachian tube does not function properly, air pressure, fluid, or both can build up in the middle ear. What are the causes? This condition occurs when the eustachian tube becomes blocked or cannot open normally. Common causes of this condition include: Ear infections. Colds and other infections that affect the nose, mouth, and throat (upper respiratory tract). Allergies. Irritation from cigarette smoke. Irritation from stomach acid coming up into the esophagus (gastroesophageal reflux). The esophagus is the part of the body that moves food from the mouth to the stomach. Sudden changes in air pressure, such as from descending in an airplane or scuba diving. Abnormal growths in the nose or throat, such as: Growths that line the nose (nasal polyps). Abnormal growth of cells (tumors). Enlarged tissue at the back of the throat (adenoids). What increases the risk? You are more likely to develop this condition if: You smoke. You are overweight. You are a child who has: Certain birth defects of the mouth, such as cleft palate. Large tonsils or adenoids. What are the signs or symptoms? Common symptoms of this condition include: A feeling of fullness in the ear. Ear pain. Clicking or popping noises in the  ear. Ringing in the ear (tinnitus). Hearing loss. Loss of balance. Dizziness. Symptoms may get worse when the air pressure around you changes, such as when you travel to an area of high elevation, fly on an airplane, or go scuba diving. How is this diagnosed? This condition may be diagnosed based on: Your symptoms. A physical exam of your ears, nose, and throat. Tests, such as those that measure: The movement of your eardrum. Your hearing (audiometry). How is this treated? Treatment depends on the cause and severity of your condition. In mild cases, you may relieve your symptoms by moving air into your ears. This is called "popping the ears." In more severe cases, or if you have symptoms of fluid in your ears, treatment may include: Medicines to relieve congestion (decongestants). Medicines that treat allergies (antihistamines). Nasal sprays or ear drops that contain medicines that reduce swelling (steroids). A procedure to drain the fluid in your eardrum. In this procedure, a small tube may be placed in the eardrum to: Drain the fluid. Restore the air in the middle ear space. A procedure to insert a balloon device through the nose to inflate the opening of the eustachian tube (balloon dilation). Follow these instructions at home: Lifestyle Do not do any of the following until your health care provider approves: Travel to high altitudes. Fly in airplanes. Work in a Pension scheme manager or room. Scuba dive. Do not use any products that contain nicotine or tobacco. These products include cigarettes, chewing tobacco, and vaping devices, such as e-cigarettes. If you need help quitting, ask your health care provider. Keep your ears  dry. Wear fitted earplugs during showering and bathing. Dry your ears completely after. General instructions Take over-the-counter and prescription medicines only as told by your health care provider. Use techniques to help pop your ears as recommended by your  health care provider. These may include: Chewing gum. Yawning. Frequent, forceful swallowing. Closing your mouth, holding your nose closed, and gently blowing as if you are trying to blow air out of your nose. Keep all follow-up visits. This is important. Contact a health care provider if: Your symptoms do not go away after treatment. Your symptoms come back after treatment. You are unable to pop your ears. You have: A fever. Pain in your ear. Pain in your head or neck. Fluid draining from your ear. Your hearing suddenly changes. You become very dizzy. You lose your balance. Get help right away if: You have a sudden, severe increase in any of your symptoms. Summary Eustachian tube dysfunction refers to a condition in which a blockage develops in the eustachian tube. It can be caused by ear infections, allergies, inhaled irritants, or abnormal growths in the nose or throat. Symptoms may include ear pain or fullness, hearing loss, or ringing in the ears. Mild cases are treated with techniques to unblock the ears, such as yawning or chewing gum. More severe cases are treated with medicines or procedures. This information is not intended to replace advice given to you by your health care provider. Make sure you discuss any questions you have with your health care provider. Document Revised: 05/17/2020 Document Reviewed: 05/17/2020 Elsevier Patient Education  [employer].

## 2021-12-07 ENCOUNTER — Other Ambulatory Visit (HOSPITAL_COMMUNITY): Payer: Self-pay

## 2021-12-12 ENCOUNTER — Other Ambulatory Visit (HOSPITAL_COMMUNITY): Payer: Self-pay

## 2021-12-12 ENCOUNTER — Other Ambulatory Visit: Payer: Self-pay | Admitting: Cardiology

## 2021-12-12 DIAGNOSIS — I480 Paroxysmal atrial fibrillation: Secondary | ICD-10-CM

## 2021-12-12 MED FILL — Apixaban Tab 5 MG: ORAL | 30 days supply | Qty: 60 | Fill #0 | Status: AC

## 2021-12-12 NOTE — Telephone Encounter (Signed)
Eliquis '5mg'$  refill request received. Patient is 50 years old, weight-209.7kg, Crea-1.00 on 08/10/2021, Diagnosis-Afib, and last seen by Dr. Quentin Ore on 07/27/2021. Dose is appropriate based on dosing criteria. Will send in refill to requested pharmacy.

## 2021-12-14 ENCOUNTER — Other Ambulatory Visit (HOSPITAL_COMMUNITY): Payer: Self-pay

## 2021-12-15 ENCOUNTER — Other Ambulatory Visit (HOSPITAL_COMMUNITY): Payer: Self-pay

## 2021-12-17 ENCOUNTER — Other Ambulatory Visit: Payer: Self-pay | Admitting: Internal Medicine

## 2021-12-17 ENCOUNTER — Other Ambulatory Visit (HOSPITAL_COMMUNITY): Payer: Self-pay

## 2021-12-17 DIAGNOSIS — F419 Anxiety disorder, unspecified: Secondary | ICD-10-CM

## 2021-12-19 ENCOUNTER — Encounter: Payer: Self-pay | Admitting: Internal Medicine

## 2021-12-19 ENCOUNTER — Other Ambulatory Visit (HOSPITAL_COMMUNITY): Payer: Self-pay

## 2021-12-19 MED ORDER — FLUOXETINE HCL 20 MG PO CAPS
60.0000 mg | ORAL_CAPSULE | Freq: Every day | ORAL | 3 refills | Status: DC
  Filled 2021-12-19: qty 90, 30d supply, fill #0
  Filled 2022-01-20: qty 90, 30d supply, fill #1
  Filled 2022-02-17: qty 90, 30d supply, fill #2
  Filled 2022-04-03: qty 90, 30d supply, fill #3

## 2021-12-20 NOTE — Addendum Note (Signed)
Addended by: Orland Mustard on: 12/20/2021 05:11 PM   Modules accepted: Orders

## 2021-12-31 ENCOUNTER — Other Ambulatory Visit (HOSPITAL_COMMUNITY): Payer: Self-pay

## 2022-01-02 ENCOUNTER — Other Ambulatory Visit (HOSPITAL_COMMUNITY): Payer: Self-pay

## 2022-01-02 DIAGNOSIS — B360 Pityriasis versicolor: Secondary | ICD-10-CM | POA: Diagnosis not present

## 2022-01-02 DIAGNOSIS — L218 Other seborrheic dermatitis: Secondary | ICD-10-CM | POA: Diagnosis not present

## 2022-01-02 MED ORDER — KETOCONAZOLE 2 % EX SHAM
MEDICATED_SHAMPOO | CUTANEOUS | 11 refills | Status: DC
  Filled 2022-01-02: qty 120, 30d supply, fill #0

## 2022-01-10 ENCOUNTER — Other Ambulatory Visit: Payer: 59

## 2022-01-20 ENCOUNTER — Other Ambulatory Visit (HOSPITAL_COMMUNITY): Payer: Self-pay

## 2022-01-20 MED FILL — Apixaban Tab 5 MG: ORAL | 30 days supply | Qty: 60 | Fill #1 | Status: AC

## 2022-01-27 ENCOUNTER — Other Ambulatory Visit (HOSPITAL_COMMUNITY): Payer: Self-pay

## 2022-01-27 ENCOUNTER — Encounter: Payer: Self-pay | Admitting: Family Medicine

## 2022-01-31 NOTE — Telephone Encounter (Signed)
Spoke to Patient regarding her medications. Patient will check with her pharmacy to see if they have the [facility]. Patient will contact Cardiologist for Eliquis refills and Patient has 2 refills of the Prozac remaining at the pharmacy. Patient verbalized understanding and is agreeable.

## 2022-02-14 ENCOUNTER — Other Ambulatory Visit: Payer: Self-pay

## 2022-02-14 ENCOUNTER — Encounter: Payer: Self-pay | Admitting: Cardiology

## 2022-02-14 DIAGNOSIS — I480 Paroxysmal atrial fibrillation: Secondary | ICD-10-CM

## 2022-02-14 MED ORDER — APIXABAN 5 MG PO TABS: 5.0000 mg | ORAL_TABLET | Freq: Two times a day (BID) | ORAL | 1 refills | Status: DC

## 2022-02-14 NOTE — Telephone Encounter (Signed)
Prescription refill request for Eliquis received. Indication: Afib  Last office visit: 07/27/21 Quentin Ore)  Scr: 1.00 (08/10/21)  Age: 51 Weight: 209.7kg  Appropriate dose and refill sent to requested pharmacy.

## 2022-02-17 ENCOUNTER — Other Ambulatory Visit: Payer: Self-pay | Admitting: Cardiology

## 2022-02-17 ENCOUNTER — Other Ambulatory Visit (HOSPITAL_COMMUNITY): Payer: Self-pay

## 2022-02-17 DIAGNOSIS — I480 Paroxysmal atrial fibrillation: Secondary | ICD-10-CM

## 2022-02-27 ENCOUNTER — Other Ambulatory Visit (HOSPITAL_COMMUNITY): Payer: Self-pay

## 2022-02-27 DIAGNOSIS — D329 Benign neoplasm of meninges, unspecified: Secondary | ICD-10-CM | POA: Diagnosis not present

## 2022-02-27 DIAGNOSIS — G4733 Obstructive sleep apnea (adult) (pediatric): Secondary | ICD-10-CM | POA: Diagnosis not present

## 2022-02-27 DIAGNOSIS — G608 Other hereditary and idiopathic neuropathies: Secondary | ICD-10-CM | POA: Diagnosis not present

## 2022-02-27 DIAGNOSIS — I480 Paroxysmal atrial fibrillation: Secondary | ICD-10-CM | POA: Diagnosis not present

## 2022-02-27 DIAGNOSIS — M79671 Pain in right foot: Secondary | ICD-10-CM | POA: Diagnosis not present

## 2022-02-27 MED ORDER — GABAPENTIN 400 MG PO CAPS
400.0000 mg | ORAL_CAPSULE | Freq: Every day | ORAL | 3 refills | Status: DC
  Filled 2022-02-27 – 2022-06-05 (×3): qty 90, 90d supply, fill #0
  Filled 2022-08-29 (×2): qty 90, 90d supply, fill #1
  Filled 2022-11-22: qty 90, 90d supply, fill #2
  Filled 2023-02-23: qty 90, 90d supply, fill #3

## 2022-02-28 ENCOUNTER — Telehealth: Payer: Self-pay | Admitting: Cardiology

## 2022-02-28 ENCOUNTER — Other Ambulatory Visit (HOSPITAL_COMMUNITY): Payer: Self-pay

## 2022-02-28 ENCOUNTER — Other Ambulatory Visit: Payer: Self-pay

## 2022-02-28 DIAGNOSIS — I480 Paroxysmal atrial fibrillation: Secondary | ICD-10-CM

## 2022-02-28 MED ORDER — APIXABAN 5 MG PO TABS
5.0000 mg | ORAL_TABLET | Freq: Two times a day (BID) | ORAL | 1 refills | Status: DC
  Filled 2022-02-28 – 2022-03-02 (×2): qty 180, 90d supply, fill #0
  Filled 2022-05-20: qty 180, 90d supply, fill #1

## 2022-02-28 NOTE — Telephone Encounter (Signed)
*  STAT* If patient is at the pharmacy, call can be transferred to refill team.   1. Which medications need to be refilled? (please list name of each medication and dose if known) apixaban (ELIQUIS) 5 MG TABS tablet   2. Which pharmacy/location (including street and city if local pharmacy) is medication to be sent to? [place] [facility]   3. Do they need a 30 day or 90 day supply? 90  

## 2022-02-28 NOTE — Telephone Encounter (Signed)
Prescription refill request for Eliquis received. Indication:afib Last office visit:5/23 Scr:1.0 Age: 51 Weight:209.7  kg  Prescription refilled

## 2022-03-02 ENCOUNTER — Other Ambulatory Visit: Payer: Self-pay

## 2022-03-02 ENCOUNTER — Other Ambulatory Visit (HOSPITAL_COMMUNITY): Payer: Self-pay

## 2022-03-24 ENCOUNTER — Other Ambulatory Visit: Payer: 59

## 2022-03-29 ENCOUNTER — Encounter: Payer: 59 | Admitting: Family Medicine

## 2022-04-03 ENCOUNTER — Other Ambulatory Visit: Payer: Self-pay

## 2022-04-03 ENCOUNTER — Other Ambulatory Visit (HOSPITAL_COMMUNITY): Payer: Self-pay

## 2022-04-03 ENCOUNTER — Other Ambulatory Visit: Payer: Self-pay | Admitting: Certified Nurse Midwife

## 2022-04-11 ENCOUNTER — Other Ambulatory Visit: Payer: Self-pay

## 2022-04-12 ENCOUNTER — Other Ambulatory Visit (HOSPITAL_COMMUNITY): Payer: Self-pay

## 2022-04-12 ENCOUNTER — Other Ambulatory Visit: Payer: Self-pay | Admitting: Certified Nurse Midwife

## 2022-04-12 ENCOUNTER — Telehealth: Payer: Self-pay

## 2022-04-12 MED ORDER — MEDROXYPROGESTERONE ACETATE 2.5 MG PO TABS: ORAL_TABLET | Freq: Every day | ORAL | 0 refills | Status: DC

## 2022-04-12 NOTE — Telephone Encounter (Signed)
Left HIPAA compliant VM. If patient calls back please read message below.

## 2022-04-13 NOTE — Telephone Encounter (Signed)
Patient is returning call. Please advise? 

## 2022-04-13 NOTE — Telephone Encounter (Signed)
I contacted patient via phone, per Collene Mares to let patient know medication has been sent to pharmacy.

## 2022-04-17 ENCOUNTER — Other Ambulatory Visit (HOSPITAL_COMMUNITY): Payer: Self-pay

## 2022-04-21 ENCOUNTER — Other Ambulatory Visit: Payer: Self-pay

## 2022-05-05 ENCOUNTER — Encounter: Payer: Self-pay | Admitting: Certified Nurse Midwife

## 2022-05-05 ENCOUNTER — Ambulatory Visit (INDEPENDENT_AMBULATORY_CARE_PROVIDER_SITE_OTHER): Payer: 59 | Admitting: Certified Nurse Midwife

## 2022-05-05 ENCOUNTER — Other Ambulatory Visit (HOSPITAL_COMMUNITY): Payer: Self-pay

## 2022-05-05 VITALS — BP 109/71 | HR 79 | Ht 61.0 in | Wt >= 6400 oz

## 2022-05-05 DIAGNOSIS — Z01419 Encounter for gynecological examination (general) (routine) without abnormal findings: Secondary | ICD-10-CM

## 2022-05-05 MED ORDER — MEDROXYPROGESTERONE ACETATE 2.5 MG PO TABS
2.5000 mg | ORAL_TABLET | Freq: Every day | ORAL | 11 refills | Status: DC
  Filled 2022-05-05: qty 30, 30d supply, fill #0
  Filled 2022-05-18: qty 90, 90d supply, fill #0
  Filled 2022-08-14: qty 90, 90d supply, fill #1
  Filled 2022-11-05 – 2022-11-09 (×2): qty 90, 90d supply, fill #2
  Filled 2023-02-04: qty 90, 90d supply, fill #3

## 2022-05-05 NOTE — Progress Notes (Addendum)
GYNECOLOGY ANNUAL PREVENTATIVE CARE ENCOUNTER NOTE  History:     Belinda Garrett is a 52 y.o. G0P0000 female here for a routine annual gynecologic exam.  Current complaints: none.   Denies abnormal vaginal bleeding, discharge, pelvic pain, problems with intercourse or other gynecologic concerns.     Social Relationship:single Living: alone  Work: none Exercise:  none  Smoke/Alcohol/drug use: denies use   Gynecologic History No LMP recorded. (Menstrual status: Oral contraceptives). Contraception: post menopausal status Last Pap: 01/14/2020. Results were: normal with negative HPV Last mammogram: 09/09/2017. Results were: normal  Obstetric History OB History  Gravida Para Term Preterm AB Living  0 0 0 0 0 0  SAB IAB Ectopic Multiple Live Births  0 0 0 0 0    Past Medical History:  Diagnosis Date   Abnormal menses    Allergy    Anxiety    Arthritis    Back pain    Back pain    Chicken pox    Depression    Hypertension    Joint pain    Joint pain    Lymphedema    Palpitations    Peripheral neuropathy 09/30/2018   Pre-diabetes    Prediabetes    Rosacea    Seasonal allergies    Sleep apnea    Stasis dermatitis of both legs    Vertigo     Past Surgical History:  Procedure Laterality Date   APPLICATION OF CRANIAL NAVIGATION N/A 09/28/2020   Procedure: APPLICATION OF CRANIAL NAVIGATION;  Surgeon: Judith Part, MD;  Location: [place];  Service: Neurosurgery;  Laterality: N/A;   CARDIOVERSION N/A 01/28/2021   Procedure: CARDIOVERSION;  Surgeon: Minna Merritts, MD;  Location: [facility];  Service: Cardiovascular;  Laterality: N/A;   CRANIOTOMY Left 09/28/2020   Procedure: Left Occipital craniotomy for tumor resection;  Surgeon: Judith Part, MD;  Location: [place];  Service: Neurosurgery;  Laterality: Left;  RM 20   WISDOM TOOTH EXTRACTION      Current Outpatient Medications on File Prior to Visit  Medication Sig Dispense Refill   Alpha-Lipoic  Acid 600 MG TABS Take 600 mg by mouth daily.     apixaban (ELIQUIS) 5 MG TABS tablet Take 1 tablet (5 mg total) by mouth 2 (two) times daily. 180 tablet 1   Cholecalciferol (VITAMIN D3) 50 MCG (2000 UT) capsule Take 2,000 Units by mouth daily.     FLUoxetine (PROZAC) 20 MG capsule Take 3 capsules by mouth daily. (stop 28m dose) 90 capsule 3   fluticasone (FLONASE) 50 MCG/ACT nasal spray Place 2 sprays into both nostrils daily as needed for allergies or rhinitis. Please deliver 16 g 2   gabapentin (NEURONTIN) 400 MG capsule Take 1 capsule (400 mg total) by mouth at bedtime. 90 capsule 3   glucosamine-chondroitin 500-400 MG tablet Take 1 tablet by mouth daily.     hydrochlorothiazide (HYDRODIURIL) 25 MG tablet Take 1 tablet (25 mg total) by mouth daily. 90 tablet 3   ketoconazole (NIZORAL) 2 % shampoo Lather into affected area. Leave on for five minutes before rinsing. Use daily for one week, then weekly thereafter for maintenance. 120 mL 11   lisinopril (ZESTRIL) 30 MG tablet Take 1 tablet (30 mg total) by mouth daily. 90 tablet 3   loratadine (CLARITIN) 10 MG tablet Take 10 mg by mouth daily.     metFORMIN (GLUCOPHAGE) 500 MG tablet TAKE 1 TABLET (500 MG TOTAL) BY MOUTH DAILY WITH  BREAKFAST. 90 tablet 3   metoprolol succinate (TOPROL XL) 25 MG 24 hr tablet Take 1 tablet (25 mg total) by mouth daily. 90 tablet 3   metroNIDAZOLE (METROGEL) 1 % gel Apply to face daily 60 g 5   Multiple Vitamin (MULTIVITAMIN) capsule Take 1 capsule by mouth daily.     Omega-3 Fatty Acids (FISH OIL) 1000 MG CAPS Take 1,000 mg by mouth daily.     sodium chloride (OCEAN) 0.65 % SOLN nasal spray Place 2 sprays into both nostrils as needed for congestion. Please deliver 30 mL 2   No current facility-administered medications on file prior to visit.    Allergies  Allergen Reactions   Dust Mite Extract     Sneezing, itchy, watery eyes   Latex     Tingling sensation   Pollen Extract     Sneezing, itchy, watery eyes     Social History:  reports that she has never smoked. She has never used smokeless tobacco. She reports that she does not currently use alcohol. She reports that she does not currently use drugs.  Family History  Problem Relation Age of Onset   Arthritis Mother    Depression Mother    Hypertension Mother    Thyroid disease Mother    Obesity Mother    Depression Father    Parkinson's disease Father    Dementia Father    Obesity Father    Arthritis Maternal Grandmother    Heart disease Maternal Grandmother    Hypertension Maternal Grandmother    Hyperlipidemia Maternal Grandmother    Stroke Maternal Grandmother    Alcohol abuse Maternal Grandfather    Arthritis Maternal Grandfather    Depression Maternal Grandfather    Hearing loss Maternal Grandfather    Heart disease Maternal Grandfather    Stroke Maternal Grandfather    Arthritis Paternal Grandmother    Arthritis Paternal Grandfather    Depression Paternal Grandfather    Heart disease Paternal Grandfather    Hypertension Paternal Grandfather    Stroke Paternal Grandfather    Breast cancer Neg Hx     The following portions of the patient's history were reviewed and updated as appropriate: allergies, current medications, past family history, past medical history, past social history, past surgical history and problem list.  Review of Systems Pertinent items noted in HPI and remainder of comprehensive ROS otherwise negative.  Physical Exam:  BP 109/71   Pulse 79   Ht 5' 1"$  (1.549 m)   Wt (!) 458 lb (207.7 kg)   BMI 86.54 kg/m  CONSTITUTIONAL: Well-developed, well-nourished, morbidly obese, female in no acute distress.  HENT:  Normocephalic, atraumatic, External right and left ear normal. Oropharynx is clear and moist EYES: Conjunctivae and EOM are normal. Pupils are equal, round, and reactive to light. No scleral icterus.  NECK: Normal range of motion, supple, no masses.  Normal thyroid.  SKIN: Skin is warm and dry.  No rash noted. Not diaphoretic. No erythema. No pallor. MUSCULOSKELETAL: Normal range of motion. No tenderness.  No cyanosis, clubbing, or edema.  2+ distal pulses. NEUROLOGIC: Alert and oriented to person, place, and time. Normal reflexes, muscle tone coordination.  PSYCHIATRIC: Normal mood and affect. Normal behavior. Normal judgment and thought content. CARDIOVASCULAR: Normal heart rate noted, regular rhythm RESPIRATORY: Clear to auscultation bilaterally. Effort and breath sounds normal, no problems with respiration noted. BREASTS: Symmetric in size. No masses, tenderness, skin changes, nipple drainage, or lymphadenopathy bilaterally. Difficult to assess due to body habitus.  ABDOMEN: Soft,  no distention noted.  No tenderness, rebound or guarding.  PELVIC: pt declines , not due for pap , denies any issues. Is not sexually active. .   Assessment and Plan:    1. Women's annual routine gynecological examination    Pap: not due Mammogram : ordered by her PCP Labs: done by PCP Refills: provera, discussed with pt possibility of being in menopause and no longer needing medication. Pt request to stay on it x 1 more year.  Colonoscopy: pt states she has if ordered by her PCP  Referral: none  Routine preventative health maintenance measures emphasized. Please refer to After Visit Summary for other counseling recommendations.      Philip Aspen, [facility]  [facility]

## 2022-05-09 ENCOUNTER — Other Ambulatory Visit (HOSPITAL_COMMUNITY): Payer: Self-pay

## 2022-05-11 ENCOUNTER — Ambulatory Visit
Admission: EM | Admit: 2022-05-11 | Discharge: 2022-05-11 | Disposition: A | Discharge status: Home / Self Care | Payer: 59 | Attending: Urgent Care | Admitting: Urgent Care

## 2022-05-11 DIAGNOSIS — U071 COVID-19: Secondary | ICD-10-CM

## 2022-05-11 MED ORDER — MOLNUPIRAVIR EUA 200MG CAPSULE
4.0000 | ORAL_CAPSULE | Freq: Two times a day (BID) | ORAL | 0 refills | Status: AC
  Filled 2022-05-12: qty 40, 5d supply, fill #0

## 2022-05-11 NOTE — ED Provider Notes (Signed)
Belinda Garrett    CSN: [id number] Arrival date & time: 05/11/22  1100      History   Chief Complaint Chief Complaint  Patient presents with   Nasal Congestion   Sore Throat   Covid Positive    HPI Belinda Garrett is a 52 y.o. female.    Sore Throat    Presents to urgent care for evaluation after positive home COVID this morning.  She complains of sore throat and nasal congestion starting 2 to 3 days ago.  Also reports postnasal drip and some cough.  She feels "fatigued" and "rundown".  Denies fever.  Nausea, vomiting, diarrhea.  Past medical history significant for DM2 taking metformin, A-fib with use of apixaban.   Past Medical History:  Diagnosis Date   Abnormal menses    Allergy    Anxiety    Arthritis    Back pain    Back pain    Chicken pox    Depression    Hypertension    Joint pain    Joint pain    Lymphedema    Palpitations    Peripheral neuropathy 09/30/2018   Pre-diabetes    Prediabetes    Rosacea    Seasonal allergies    Sleep apnea    Stasis dermatitis of both legs    Vertigo     Patient Active Problem List   Diagnosis Date Noted   BMI 70 and over, adult ([place]) 08/10/2021   Paroxysmal atrial fibrillation ([place]) 12/29/2020   Brain tumor ([place]) 09/28/2020   Meningioma ([place]) 08/18/2020   Lesion of parotid gland 08/09/2020   Brain mass 08/09/2020   Seborrheic dermatitis 06/16/2020   Palpitations 06/16/2020   Other chronic pain 06/16/2020   Annual physical exam 12/16/2019   OSA on CPAP 12/02/2019   Morbid obesity with BMI of 70 and over, adult ([place]) 10/06/2019   Chronic pain of both knees 07/02/2019   Lymphedema of both lower extremities 06/11/2019   DUB (dysfunctional uterine bleeding) 06/11/2019   Peripheral neuropathy 09/30/2018   Anxiety and depression 07/11/2018   Abnormal CT scan, lumbar spine 07/11/2018   Prediabetes 01/09/2018   Class 3 severe obesity with serious comorbidity and body mass index (BMI) greater than or  equal to 70 in adult ([place]) 01/09/2018   Leukocytosis 01/08/2018   Rosacea 01/08/2018   Stasis dermatitis 01/08/2018   HLD (hyperlipidemia) 10/05/2017   HTN (hypertension) 09/05/2017   Numbness of toes 09/05/2017   Morbid obesity ([place]) 11/20/2013   Endometrial hyperplasia, simple 11/20/2013    Past Surgical History:  Procedure Laterality Date   APPLICATION OF CRANIAL NAVIGATION N/A 09/28/2020   Procedure: APPLICATION OF CRANIAL NAVIGATION;  Surgeon: Judith Part, MD;  Location: [facility];  Service: Neurosurgery;  Laterality: N/A;   CARDIOVERSION N/A 01/28/2021   Procedure: CARDIOVERSION;  Surgeon: Minna Merritts, MD;  Location: [facility];  Service: Cardiovascular;  Laterality: N/A;   CRANIOTOMY Left 09/28/2020   Procedure: Left Occipital craniotomy for tumor resection;  Surgeon: Judith Part, MD;  Location: [facility];  Service: Neurosurgery;  Laterality: Left;  RM 20   WISDOM TOOTH EXTRACTION      OB History     Gravida  0   Para  0   Term  0   Preterm  0   AB  0   Living  0      SAB  0   IAB  0   Ectopic  0   Multiple  0  Live Births  0            Home Medications    Prior to Admission medications   Medication Sig Start Date End Date Taking? Authorizing Provider  Alpha-Lipoic Acid 600 MG TABS Take 600 mg by mouth daily.    [provider]  apixaban (ELIQUIS) 5 MG TABS tablet Take 1 tablet (5 mg total) by mouth 2 (two) times daily. 02/28/22   Vickie Epley, MD  Cholecalciferol (VITAMIN D3) 50 MCG (2000 UT) capsule Take 2,000 Units by mouth daily.    [provider]  FLUoxetine (PROZAC) 20 MG capsule Take 3 capsules by mouth daily. (stop 16m dose) 12/19/21   McLean-Scocuzza, TNino Glow MD  fluticasone (FLONASE) 50 MCG/ACT nasal spray Place 2 sprays into both nostrils daily as needed for allergies or rhinitis. Please deliver 12/06/21   McLean-Scocuzza, TNino Glow MD  gabapentin (NEURONTIN) 400 MG capsule Take 1 capsule (400 mg  total) by mouth at bedtime. 02/27/22   SVladimir Crofts MD  glucosamine-chondroitin 500-400 MG tablet Take 1 tablet by mouth daily.    [provider]  hydrochlorothiazide (HYDRODIURIL) 25 MG tablet Take 1 tablet (25 mg total) by mouth daily. 12/06/21 12/06/22  McLean-Scocuzza, TNino Glow MD  ketoconazole (NIZORAL) 2 % shampoo Lather into affected area. Leave on for five minutes before rinsing. Use daily for one week, then weekly thereafter for maintenance. 01/02/22     lisinopril (ZESTRIL) 30 MG tablet Take 1 tablet (30 mg total) by mouth daily. 12/06/21 12/06/22  McLean-Scocuzza, TNino Glow MD  loratadine (CLARITIN) 10 MG tablet Take 10 mg by mouth daily.    [provider]  medroxyPROGESTERone (PROVERA) 2.5 MG tablet Take 1 tablet (2.5 mg total) by mouth daily. 05/05/22 05/05/23  TPhilip Aspen CNM  metFORMIN (GLUCOPHAGE) 500 MG tablet TAKE 1 TABLET (500 MG TOTAL) BY MOUTH DAILY WITH BREAKFAST. 12/06/21 12/06/22  McLean-Scocuzza, TNino Glow MD  metoprolol succinate (TOPROL XL) 25 MG 24 hr tablet Take 1 tablet (25 mg total) by mouth daily. 12/06/21   McLean-Scocuzza, TNino Glow MD  metroNIDAZOLE (METROGEL) 1 % gel Apply to face daily 09/02/21     Multiple Vitamin (MULTIVITAMIN) capsule Take 1 capsule by mouth daily.    [provider]  Omega-3 Fatty Acids (FISH OIL) 1000 MG CAPS Take 1,000 mg by mouth daily.    [provider]  sodium chloride (OCEAN) 0.65 % SOLN nasal spray Place 2 sprays into both nostrils as needed for congestion. Please deliver 12/06/21   McLean-Scocuzza, TNino Glow MD    Family History Family History  Problem Relation Age of Onset   Arthritis Mother    Depression Mother    Hypertension Mother    Thyroid disease Mother    Obesity Mother    Depression Father    Parkinson's disease Father    Dementia Father    Obesity Father    Arthritis Maternal Grandmother    Heart disease Maternal Grandmother    Hypertension Maternal Grandmother    Hyperlipidemia  Maternal Grandmother    Stroke Maternal Grandmother    Alcohol abuse Maternal Grandfather    Arthritis Maternal Grandfather    Depression Maternal Grandfather    Hearing loss Maternal Grandfather    Heart disease Maternal Grandfather    Stroke Maternal Grandfather    Arthritis Paternal Grandmother    Arthritis Paternal Grandfather    Depression Paternal Grandfather    Heart disease Paternal Grandfather    Hypertension Paternal GMerchant navy officer  Stroke Paternal Grandfather    Breast cancer Neg Hx     Social History Social History   Tobacco Use   Smoking status: Never   Smokeless tobacco: Never  Vaping Use   Vaping Use: Never used  Substance Use Topics   Alcohol use: Not Currently    Alcohol/week: 0.0 standard drinks of alcohol   Drug use: Not Currently     Allergies   Dust mite extract, Latex, and Pollen extract   Review of Systems Review of Systems   Physical Exam Triage Vital Signs ED Triage Vitals [05/11/22 1145]  Enc Vitals Group     BP (!) 157/68     Pulse Rate 95     Resp 20     Temp 99.2 F (37.3 C)     Temp Source Oral     SpO2 95 %     Weight      Height      Head Circumference      Peak Flow      Pain Score 0     Pain Loc      Pain Edu?      Excl. in [place]?    No data found.  Updated Vital Signs BP (!) 157/68 (BP Location: Left Wrist)   Pulse 95   Temp 99.2 F (37.3 C) (Oral)   Resp 20   SpO2 95%   Visual Acuity Right Eye Distance:   Left Eye Distance:   Bilateral Distance:    Right Eye Near:   Left Eye Near:    Bilateral Near:     Physical Exam Vitals reviewed.  Constitutional:      Appearance: She is well-developed.  HENT:     Mouth/Throat:     Pharynx: Oropharynx is clear. No posterior oropharyngeal erythema.  Cardiovascular:     Rate and Rhythm: Normal rate and regular rhythm.     Heart sounds: Normal heart sounds.  Pulmonary:     Effort: Pulmonary effort is normal.     Breath sounds: Decreased breath sounds present.   Skin:    General: Skin is warm and dry.  Neurological:     General: No focal deficit present.     Mental Status: She is alert and oriented to person, place, and time.  Psychiatric:        Mood and Affect: Mood normal.        Behavior: Behavior normal.      UC Treatments / Results  Labs (all labs ordered are listed, but only abnormal results are displayed) Labs Reviewed - No data to display  EKG   Radiology No results found.  Procedures Procedures (including critical care time)  Medications Ordered in UC Medications - No data to display  Initial Impression / Assessment and Plan / UC Course  I have reviewed the triage vital signs and the nursing notes.  Pertinent labs & imaging results that were available during my care of the patient were reviewed by me and considered in my medical decision making (see chart for details).   Patient is afebrile here without recent antipyretics. Satting well on room air. Overall is well appearing, well hydrated, without respiratory distress. Pulmonary exam is unremarkable.  Lungs CTAB without wheezing, rhonchi, rales.  Pharyngeal erythema or peritonsillar exudates.  Patient is at risk for poor outcome from COVID-19 due to multiple comorbidities as well as morbid obesity.  Will recommend use of antiviral therapy.  She is in eligible for Paxlovid due to Eliquis use for  A-fib so will recommend molnupiravir.  Otherwise recommending use of tolerated OTC medication for symptom control.    Final Clinical Impressions(s) / UC Diagnoses   Final diagnoses:  None   Discharge Instructions   None    ED Prescriptions   None    PDMP not reviewed this encounter.   Rose Phi, [place] 05/11/22 1214

## 2022-05-11 NOTE — Discharge Instructions (Signed)
Follow up here or with your primary care provider if your symptoms are worsening or not improving.     

## 2022-05-11 NOTE — ED Triage Notes (Signed)
Patient to Urgent Care with complaints of sore throat and nasal congestion that started 2-3 days ago. Also reports post nasal drip. Some cough, reports it was productive this morning. Also reports feeling fatigued and run down.  Reports having a positive home covid test this morning.   Denies any known fevers. Taking claritin.

## 2022-05-12 ENCOUNTER — Other Ambulatory Visit: Payer: Self-pay

## 2022-05-16 ENCOUNTER — Other Ambulatory Visit: Payer: Self-pay

## 2022-05-16 ENCOUNTER — Encounter: Payer: Self-pay | Admitting: Family Medicine

## 2022-05-16 ENCOUNTER — Telehealth (INDEPENDENT_AMBULATORY_CARE_PROVIDER_SITE_OTHER): Payer: 59 | Admitting: Nurse Practitioner

## 2022-05-16 VITALS — Ht 61.0 in | Wt >= 6400 oz

## 2022-05-16 DIAGNOSIS — J019 Acute sinusitis, unspecified: Secondary | ICD-10-CM | POA: Diagnosis not present

## 2022-05-16 MED ORDER — AZITHROMYCIN 250 MG PO TABS
ORAL_TABLET | ORAL | 0 refills | Status: AC
  Filled 2022-05-16: qty 6, 5d supply, fill #0

## 2022-05-16 NOTE — Progress Notes (Signed)
MyChart Video Visit    Virtual Visit via Video Note   This visit type was conducted due to national recommendations for restrictions regarding the COVID-19 Pandemic (e.g. social distancing) in an effort to limit this patient's exposure and mitigate transmission in our community. This patient is at least at moderate risk for complications without adequate follow up. This format is felt to be most appropriate for this patient at this time. Physical exam was limited by quality of the video and audio technology used for the visit. CMA was able to get the patient set up on a video visit.  Patient location: Home. Patient and provider in visit Provider location: Office  I discussed the limitations of evaluation and management by telemedicine and the availability of in person appointments. The patient expressed understanding and agreed to proceed.  Visit Date: 05/16/2022  Today's healthcare provider: Tomasita Morrow, NP     Subjective:    Patient ID: Belinda Garrett, female    DOB: [birth year], 52 y.o.   MRN: [id number]  Chief Complaint  Patient presents with   Covid Positive    Last Thursday tested positive went to urgent care and was given the alternative to paxlovid due to medications she is on, pt stated she is on the last day of her medication and still not feeling any better. Pt states she is coughing up phlegm and sneezing     HPI Patient reports symptoms started last Monday 05/08/2022. She tested positive for COVID on 05/11/2022 and when to Urgent Care where she was given Molnupiravir. She is on day 5 of her medication and continues to not feel well. She reports some of her symptoms have resolved, however she still has lingering coughing and congestion. She is unsure what she can take over the counter due to her medications and history of hypertension.   Respiratory illness:  Cough- Yes, productive.   Congestion-    Sinus- Yes, nasal   Chest- No  Post nasal drip- Yes  Sore  throat- Resolved  Shortness of breath- No  Fever- No  Fatigue/Myalgia- Resolved Headache- No Nausea/Vomiting- No Taste disturbance- No  Smell disturbance- Yes  Covid vaccination- x 4  Flu vaccination- UTD  Medications- Molnupiravir  Past Medical History:  Diagnosis Date   Abnormal menses    Allergy    Anxiety    Arthritis    Back pain    Back pain    Chicken pox    Depression    Hypertension    Joint pain    Joint pain    Lymphedema    Palpitations    Peripheral neuropathy 09/30/2018   Pre-diabetes    Prediabetes    Rosacea    Seasonal allergies    Sleep apnea    Stasis dermatitis of both legs    Vertigo     Past Surgical History:  Procedure Laterality Date   APPLICATION OF CRANIAL NAVIGATION N/A 09/28/2020   Procedure: APPLICATION OF CRANIAL NAVIGATION;  Surgeon: Judith Part, MD;  Location: [facility];  Service: Neurosurgery;  Laterality: N/A;   CARDIOVERSION N/A 01/28/2021   Procedure: CARDIOVERSION;  Surgeon: Minna Merritts, MD;  Location: [facility];  Service: Cardiovascular;  Laterality: N/A;   CRANIOTOMY Left 09/28/2020   Procedure: Left Occipital craniotomy for tumor resection;  Surgeon: Judith Part, MD;  Location: [facility];  Service: Neurosurgery;  Laterality: Left;  RM 20   WISDOM TOOTH EXTRACTION      Family History  Problem Relation Age  of Onset   Arthritis Mother    Depression Mother    Hypertension Mother    Thyroid disease Mother    Obesity Mother    Depression Father    Parkinson's disease Father    Dementia Father    Obesity Father    Arthritis Maternal Grandmother    Heart disease Maternal Grandmother    Hypertension Maternal Grandmother    Hyperlipidemia Maternal Grandmother    Stroke Maternal Grandmother    Alcohol abuse Maternal Grandfather    Arthritis Maternal Grandfather    Depression Maternal Grandfather    Hearing loss Maternal Grandfather    Heart disease Maternal Grandfather    Stroke Maternal Grandfather     Arthritis Paternal Grandmother    Arthritis Paternal Grandfather    Depression Paternal Grandfather    Heart disease Paternal Grandfather    Hypertension Paternal Grandfather    Stroke Paternal Grandfather    Breast cancer Neg Hx     Social History   Socioeconomic History   Marital status: Single    Spouse name: Not on file   Number of children: Not on file   Years of education: Not on file   Highest education level: Not on file  Occupational History   Occupation: work from home, Designer, jewellery - [employer]  Tobacco Use   Smoking status: Never   Smokeless tobacco: Never  Vaping Use   Vaping Use: Never used  Substance and Sexual Activity   Alcohol use: Not Currently    Alcohol/week: 0.0 standard drinks of alcohol   Drug use: Not Currently   Sexual activity: Never  Other Topics Concern   Not on file  Social History Narrative   Works for [employer] from home    No kids    No guns    Wears seat belt    Safe in relationship    Social Determinants of Health   Financial Resource Strain: Not on file  Food Insecurity: Not on file  Transportation Needs: Not on file  Physical Activity: Not on file  Stress: Not on file  Social Connections: Not on file  Intimate Partner Violence: Not on file    Outpatient Medications Prior to Visit  Medication Sig Dispense Refill   Alpha-Lipoic Acid 600 MG TABS Take 600 mg by mouth daily.     apixaban (ELIQUIS) 5 MG TABS tablet Take 1 tablet (5 mg total) by mouth 2 (two) times daily. 180 tablet 1   Cholecalciferol (VITAMIN D3) 50 MCG (2000 UT) capsule Take 2,000 Units by mouth daily.     FLUoxetine (PROZAC) 20 MG capsule Take 3 capsules by mouth daily. (stop '40mg'$  dose) 90 capsule 3   fluticasone (FLONASE) 50 MCG/ACT nasal spray Place 2 sprays into both nostrils daily as needed for allergies or rhinitis. Please deliver 16 g 2   gabapentin (NEURONTIN) 400 MG capsule Take 1 capsule (400 mg total) by mouth at bedtime. 90 capsule 3    glucosamine-chondroitin 500-400 MG tablet Take 1 tablet by mouth daily.     hydrochlorothiazide (HYDRODIURIL) 25 MG tablet Take 1 tablet (25 mg total) by mouth daily. 90 tablet 3   ketoconazole (NIZORAL) 2 % shampoo Lather into affected area. Leave on for five minutes before rinsing. Use daily for one week, then weekly thereafter for maintenance. 120 mL 11   lisinopril (ZESTRIL) 30 MG tablet Take 1 tablet (30 mg total) by mouth daily. 90 tablet 3   loratadine (CLARITIN) 10 MG tablet Take 10 mg by  mouth daily.     medroxyPROGESTERone (PROVERA) 2.5 MG tablet Take 1 tablet (2.5 mg total) by mouth daily. 30 tablet 11   metFORMIN (GLUCOPHAGE) 500 MG tablet TAKE 1 TABLET (500 MG TOTAL) BY MOUTH DAILY WITH BREAKFAST. 90 tablet 3   metoprolol succinate (TOPROL XL) 25 MG 24 hr tablet Take 1 tablet (25 mg total) by mouth daily. 90 tablet 3   metroNIDAZOLE (METROGEL) 1 % gel Apply to face daily 60 g 5   molnupiravir EUA (LAGEVRIO) 200 mg CAPS capsule Take 4 capsules (800 mg total) by mouth 2 (two) times daily for 5 days. 40 capsule 0   Multiple Vitamin (MULTIVITAMIN) capsule Take 1 capsule by mouth daily.     Omega-3 Fatty Acids (FISH OIL) 1000 MG CAPS Take 1,000 mg by mouth daily.     sodium chloride (OCEAN) 0.65 % SOLN nasal spray Place 2 sprays into both nostrils as needed for congestion. Please deliver (Patient not taking: Reported on 05/16/2022) 30 mL 2   No facility-administered medications prior to visit.    Allergies  Allergen Reactions   Dust Mite Extract     Sneezing, itchy, watery eyes   Latex     Tingling sensation   Pollen Extract     Sneezing, itchy, watery eyes    ROS See HPI    Objective:    Physical Exam  Ht '5\' 1"'$  (1.549 m)   Wt (!) 458 lb (207.7 kg)   BMI 86.54 kg/m  Wt Readings from Last 3 Encounters:  05/16/22 (!) 458 lb (207.7 kg)  05/05/22 (!) 458 lb (207.7 kg)  12/06/21 (!) 462 lb 3.2 oz (209.7 kg)   GENERAL: alert, oriented, appears well and in no acute  distress   HEENT: atraumatic, conjunttiva clear, no obvious abnormalities on inspection of external nose and ears   NECK: normal movements of the head and neck   LUNGS: on inspection no signs of respiratory distress, breathing rate appears normal, no obvious gross SOB, gasping or wheezing   CV: no obvious cyanosis   MS: moves all visible extremities without noticeable abnormality   PSYCH/NEURO: pleasant and cooperative, no obvious depression or anxiety, speech and thought processing grossly intact     Assessment & Plan:   Problem List Items Addressed This Visit       Respiratory   Acute non-recurrent sinusitis - Primary    Symptoms consistent with sinusitis, likely turned bacterial after having COVID. Will treat with Zpak. She will start it tomorrow after completing her Molnupiravir. She has nasal sprays at home to use for help with congestion. Denies benzonatate for cough. Encouraged adequate fluid intake. Advised to seek in person care if continuing not to improve.       Relevant Medications   azithromycin (ZITHROMAX) 250 MG tablet    I am having Belinda Dy D. Episcopo "Angie" start on azithromycin. I am also having her maintain her loratadine, glucosamine-chondroitin, Fish Oil, multivitamin, Vitamin D3, Alpha-Lipoic Acid, metroNIDAZOLE, fluticasone, sodium chloride, metoprolol succinate, hydrochlorothiazide, lisinopril, metFORMIN, FLUoxetine, ketoconazole, gabapentin, apixaban, medroxyPROGESTERone, and molnupiravir EUA.  Meds ordered this encounter  Medications   azithromycin (ZITHROMAX) 250 MG tablet    Sig: Take 2 tablets on day 1, then 1 tablet daily on days 2 through 5    Dispense:  6 tablet    Refill:  0    Order Specific Question:   Supervising Provider    Answer:   Caryl Bis, ERIC G [[id number]]    I discussed the assessment and treatment plan  with the patient. The patient was provided an opportunity to ask questions and all were answered. The patient agreed with the plan and  demonstrated an understanding of the instructions.   The patient was advised to call back or seek an in-person evaluation if the symptoms worsen or if the condition fails to improve as anticipated.   Tomasita Morrow, NP [place] [facility] [phone] (phone) [phone] (fax)  [place]

## 2022-05-16 NOTE — Assessment & Plan Note (Signed)
Symptoms consistent with sinusitis, likely turned bacterial after having COVID. Will treat with Zpak. She will start it tomorrow after completing her Molnupiravir. She has nasal sprays at home to use for help with congestion. Denies benzonatate for cough. Encouraged adequate fluid intake. Advised to seek in person care if continuing not to improve.

## 2022-05-17 NOTE — Addendum Note (Signed)
Addended by: Tomasita Morrow on: 05/17/2022 10:03 AM   Modules accepted: Level of Service

## 2022-05-18 ENCOUNTER — Other Ambulatory Visit (HOSPITAL_COMMUNITY): Payer: Self-pay

## 2022-05-18 ENCOUNTER — Other Ambulatory Visit: Payer: Self-pay

## 2022-05-22 ENCOUNTER — Other Ambulatory Visit: Payer: Self-pay

## 2022-06-05 ENCOUNTER — Other Ambulatory Visit: Payer: Self-pay

## 2022-06-05 ENCOUNTER — Other Ambulatory Visit: Payer: Self-pay | Admitting: Family Medicine

## 2022-06-05 ENCOUNTER — Other Ambulatory Visit (HOSPITAL_COMMUNITY): Payer: Self-pay

## 2022-06-05 DIAGNOSIS — F419 Anxiety disorder, unspecified: Secondary | ICD-10-CM

## 2022-06-05 MED ORDER — FLUOXETINE HCL 20 MG PO CAPS
60.0000 mg | ORAL_CAPSULE | Freq: Every day | ORAL | 3 refills | Status: DC
  Filled 2022-06-05: qty 90, 30d supply, fill #0
  Filled 2022-06-28: qty 90, 30d supply, fill #1
  Filled 2022-07-28: qty 90, 30d supply, fill #2
  Filled 2022-08-29 (×2): qty 90, 30d supply, fill #3

## 2022-06-05 NOTE — Telephone Encounter (Signed)
Prescription Request  06/05/2022  LOV: Visit date not found  What is the name of the medication or equipment? FLUoxetine (PROZAC) 20 MG capsule  Have you contacted your pharmacy to request a refill? Yes   Which pharmacy would you like this sent to?   [facility] [place] [address] Phone: [phone] Fax: [phone]      Patient notified that their request is being sent to the clinical staff for review and that they should receive a response within 2 business days.   Please advise at Mobile [phone] (mobile)

## 2022-06-28 ENCOUNTER — Other Ambulatory Visit (HOSPITAL_COMMUNITY): Payer: Self-pay

## 2022-06-30 DIAGNOSIS — G4733 Obstructive sleep apnea (adult) (pediatric): Secondary | ICD-10-CM | POA: Diagnosis not present

## 2022-07-12 ENCOUNTER — Ambulatory Visit: Payer: 59 | Admitting: Podiatry

## 2022-07-12 ENCOUNTER — Encounter: Payer: Self-pay | Admitting: Podiatrist

## 2022-07-12 ENCOUNTER — Ambulatory Visit (INDEPENDENT_AMBULATORY_CARE_PROVIDER_SITE_OTHER): Payer: 59 | Admitting: Podiatrist

## 2022-07-12 DIAGNOSIS — G629 Polyneuropathy, unspecified: Secondary | ICD-10-CM

## 2022-07-12 DIAGNOSIS — L603 Nail dystrophy: Secondary | ICD-10-CM | POA: Diagnosis not present

## 2022-07-12 NOTE — Progress Notes (Signed)
Chief Complaint  Patient presents with   Debridement    Requesting toenail trim - also has neuropathy-"prediabetic"   New Patient (Initial Visit)       HPI: Patient is 52 y.o. female who presents today for a nail trim of her toenails.  She is on a blood thinner (eliquis) and is concerned about trimming her own toenails.  She also relates a history of neuropathy for which she takes gabapentin- and relates she is pre diabetic.  No other pedal concerns reported.   Patient Active Problem List   Diagnosis Date Noted   Acute non-recurrent sinusitis 05/16/2022   BMI 70 and over, adult 08/10/2021   Paroxysmal atrial fibrillation 12/29/2020   Brain tumor 09/28/2020   Meningioma 08/18/2020   Lesion of parotid gland 08/09/2020   Brain mass 08/09/2020   Seborrheic dermatitis 06/16/2020   Palpitations 06/16/2020   Other chronic pain 06/16/2020   Annual physical exam 12/16/2019   OSA on CPAP 12/02/2019   Morbid obesity with BMI of 70 and over, adult 10/06/2019   Chronic pain of both knees 07/02/2019   Lymphedema of both lower extremities 06/11/2019   DUB (dysfunctional uterine bleeding) 06/11/2019   Peripheral neuropathy 09/30/2018   Anxiety and depression 07/11/2018   Abnormal CT scan, lumbar spine 07/11/2018   Prediabetes 01/09/2018   Class 3 severe obesity with serious comorbidity and body mass index (BMI) greater than or equal to 70 in adult 01/09/2018   Leukocytosis 01/08/2018   Rosacea 01/08/2018   Stasis dermatitis 01/08/2018   HLD (hyperlipidemia) 10/05/2017   HTN (hypertension) 09/05/2017   Numbness of toes 09/05/2017   Vertigo 03/19/2017   Morbid obesity 11/20/2013   Endometrial hyperplasia, simple 11/20/2013    Current Outpatient Medications on File Prior to Visit  Medication Sig Dispense Refill   Alpha-Lipoic Acid 600 MG TABS Take 600 mg by mouth daily.     apixaban (ELIQUIS) 5 MG TABS tablet Take 1 tablet (5 mg total) by mouth 2 (two) times daily. 180 tablet 1    Cholecalciferol (VITAMIN D3) 50 MCG (2000 UT) capsule Take 2,000 Units by mouth daily.     FLUoxetine (PROZAC) 20 MG capsule Take 3 capsules (60 mg total) by mouth daily. (stop 40mg  dose) 90 capsule 3   fluticasone (FLONASE) 50 MCG/ACT nasal spray Place 2 sprays into both nostrils daily as needed for allergies or rhinitis. Please deliver 16 g 2   gabapentin (NEURONTIN) 400 MG capsule Take 1 capsule (400 mg total) by mouth at bedtime. 90 capsule 3   glucosamine-chondroitin 500-400 MG tablet Take 1 tablet by mouth daily.     hydrochlorothiazide (HYDRODIURIL) 25 MG tablet Take 1 tablet (25 mg total) by mouth daily. 90 tablet 3   ketoconazole (NIZORAL) 2 % shampoo Lather into affected area. Leave on for five minutes before rinsing. Use daily for one week, then weekly thereafter for maintenance. 120 mL 11   lisinopril (ZESTRIL) 30 MG tablet Take 1 tablet (30 mg total) by mouth daily. 90 tablet 3   loratadine (CLARITIN) 10 MG tablet Take 10 mg by mouth daily.     medroxyPROGESTERone (PROVERA) 2.5 MG tablet Take 1 tablet (2.5 mg total) by mouth daily. 30 tablet 11   metFORMIN (GLUCOPHAGE) 500 MG tablet TAKE 1 TABLET (500 MG TOTAL) BY MOUTH DAILY WITH BREAKFAST. 90 tablet 3   metoprolol succinate (TOPROL XL) 25 MG 24 hr tablet Take 1 tablet (25 mg total) by mouth daily. 90 tablet 3   metroNIDAZOLE (METROGEL) 1 %  gel Apply to face daily 60 g 5   Multiple Vitamin (MULTIVITAMIN) capsule Take 1 capsule by mouth daily.     Omega-3 Fatty Acids (FISH OIL) 1000 MG CAPS Take 1,000 mg by mouth daily.     sodium chloride (OCEAN) 0.65 % SOLN nasal spray Place 2 sprays into both nostrils as needed for congestion. Please deliver (Patient not taking: Reported on 05/16/2022) 30 mL 2   No current facility-administered medications on file prior to visit.    Allergies  Allergen Reactions   Dust Mite Extract     Sneezing, itchy, watery eyes   Latex     Tingling sensation   Pollen Extract     Sneezing, itchy, watery  eyes    Review of Systems No fevers, chills, nausea, muscle aches, no difficulty breathing, no calf pain, no chest pain or shortness of breath.   Physical Exam  GENERAL APPEARANCE: Alert, conversant. Appropriately groomed. No acute distress.   VASCULAR: Pedal pulses palpable 1/4 DP and 1/4 PT bilateral.  Capillary refill time is immediate to all digits,  Proximal to distal cooling is warm to warm.  Digital perfusion adequate. Edema noted bilateral lower extremities.   NEUROLOGIC: sensation is intact to 5.07 monofilament at 3/5 sites right,  4/5 sites left.  Light touch is intact bilateral, vibratory sensation intact bilateral  MUSCULOSKELETAL: acceptable muscle strength, tone and stability bilateral.  Hammertoe 2-4 present bilateral. Pes planus foot type noted.   DERMATOLOGIC: skin is warm, supple, and dry.  Color, texture, and turgor of skin within normal limits.  No open wounds are noted. Stasis dermatitis noted.  No preulcerative lesions are seen.  Digital nails are elongated, discolored and thickened x 10. No sign of bacterial infection noted.       Assessment     ICD-10-CM   1. Nail dystrophy  L60.3     2. Peripheral polyneuropathy  G62.9        Plan  Debridement of toenails was recommended.  Onychoreduction of symptomatic toenails was performed via nail nipper and power burr without iatrogenic incident.  Patient was instructed on signs and symptoms of infection and was told to call immediately should any of these arise.  Recommended follow up in 3 months or instructed to call sooner if any pedal concerns arise.

## 2022-07-13 ENCOUNTER — Telehealth: Payer: Self-pay | Admitting: Family Medicine

## 2022-07-13 ENCOUNTER — Other Ambulatory Visit: Payer: Self-pay | Admitting: Family Medicine

## 2022-07-13 DIAGNOSIS — I10 Essential (primary) hypertension: Secondary | ICD-10-CM

## 2022-07-13 DIAGNOSIS — E118 Type 2 diabetes mellitus with unspecified complications: Secondary | ICD-10-CM

## 2022-07-13 NOTE — Telephone Encounter (Signed)
Patient has a lab appointment 07/19/2022, there are no orders in.

## 2022-07-13 NOTE — Telephone Encounter (Signed)
Pt has TOC appt on 07/26/22. Current lab orders are under Dr. [employer]

## 2022-07-17 ENCOUNTER — Other Ambulatory Visit (HOSPITAL_COMMUNITY): Payer: Self-pay

## 2022-07-17 NOTE — Progress Notes (Unsigned)
Cardiology Office Note Date:  07/18/2022  Patient ID:  Belinda Garrett, Belinda Garrett 1970-06-27, MRN [id number] PCP:  Dana Allan, MD  Cardiologist:  None Electrophysiologist: Lanier Prude, MD    Chief Complaint: 1 year AF follow-up  History of Present Illness: Belinda Garrett is a 52 y.o. female with PMH notable for parox AFib, SVT, HTN, OSA; seen today for Lanier Prude, MD for routine electrophysiology followup.  She last saw Dr. Lalla Brothers 07/2021, had worn heart monitor which showed salvos of SVT, no AFib. Metop increased to 25mg  daily.  Today, she tells me that she feels well from a cardiac perspective. No palpitations, no heart racing episodes.  No bleeding concerns on eliquis.  Does not check BP regularly at home.  Uses CPAP nightly - named CPAP Thurman Coyer   she denies chest pain, palpitations, dyspnea, PND, orthopnea, nausea, vomiting, dizziness, syncope, edema, weight gain, or early satiety.     Past Medical History:  Diagnosis Date   Abnormal menses    Allergy    Anxiety    Arthritis    Back pain    Back pain    Chicken pox    Depression    Hypertension    Joint pain    Joint pain    Lymphedema    Palpitations    Peripheral neuropathy 09/30/2018   Pre-diabetes    Prediabetes    Rosacea    Seasonal allergies    Sleep apnea    Stasis dermatitis of both legs    Vertigo     Past Surgical History:  Procedure Laterality Date   APPLICATION OF CRANIAL NAVIGATION N/A 09/28/2020   Procedure: APPLICATION OF CRANIAL NAVIGATION;  Surgeon: Jadene Pierini, MD;  Location: [facility] OR;  Service: Neurosurgery;  Laterality: N/A;   CARDIOVERSION N/A 01/28/2021   Procedure: CARDIOVERSION;  Surgeon: Antonieta Iba, MD;  Location: [facility] ORS;  Service: Cardiovascular;  Laterality: N/A;   CRANIOTOMY Left 09/28/2020   Procedure: Left Occipital craniotomy for tumor resection;  Surgeon: Jadene Pierini, MD;  Location: [facility] OR;  Service: Neurosurgery;  Laterality: Left;  RM 20    WISDOM TOOTH EXTRACTION      Current Outpatient Medications  Medication Instructions   Alpha-Lipoic Acid 600 mg, Oral, Daily   Eliquis 5 mg, Oral, 2 times daily   Fish Oil 1,000 mg, Oral, Daily   FLUoxetine (PROZAC) 20 MG capsule Take 3 capsules (60 mg total) by mouth daily. (stop 40mg  dose)   fluticasone (FLONASE) 50 MCG/ACT nasal spray 2 sprays, Each Nare, Daily PRN, Please deliver   gabapentin (NEURONTIN) 400 mg, Oral, Daily at bedtime   glucosamine-chondroitin 500-400 MG tablet 1 tablet, Oral, Daily   hydrochlorothiazide (HYDRODIURIL) 25 mg, Oral, Daily   ketoconazole (NIZORAL) 2 % shampoo Lather into affected area. Leave on for five minutes before rinsing. Use daily for one week, then weekly thereafter for maintenance.   lisinopril (ZESTRIL) 30 mg, Oral, Daily   loratadine (CLARITIN) 10 mg, Oral, Daily   medroxyPROGESTERone (PROVERA) 2.5 mg, Oral, Daily   metFORMIN (GLUCOPHAGE) 500 MG tablet TAKE 1 TABLET (500 MG TOTAL) BY MOUTH DAILY WITH BREAKFAST.   metoprolol succinate (TOPROL XL) 25 mg, Oral, Daily   metroNIDAZOLE (METROGEL) 1 % gel Apply to face daily   Multiple Vitamin (MULTIVITAMIN) capsule 1 capsule, Oral, Daily   sodium chloride (OCEAN) 0.65 % SOLN nasal spray 2 sprays, Each Nare, As needed, Please deliver   Vitamin D3 2,000 Units, Oral, Daily  Social History:  The patient  reports that she has never smoked. She has never used smokeless tobacco. She reports that she does not currently use alcohol. She reports that she does not currently use drugs.   Family History:  The patient's family history includes Alcohol abuse in her maternal grandfather; Arthritis in her maternal grandfather, maternal grandmother, mother, paternal grandfather, and paternal grandmother; Dementia in her father; Depression in her father, maternal grandfather, mother, and paternal grandfather; Hearing loss in her maternal grandfather; Heart disease in her maternal grandfather, maternal grandmother,  and paternal grandfather; Hyperlipidemia in her maternal grandmother; Hypertension in her maternal grandmother, mother, and paternal grandfather; Obesity in her father and mother; Parkinson's disease in her father; Stroke in her maternal grandfather, maternal grandmother, and paternal grandfather; Thyroid disease in her mother.  ROS:  Please see the history of present illness. All other systems are reviewed and otherwise negative.   PHYSICAL EXAM:  VS:  BP (!) 149/71 (BP Location: Left Arm, Patient Position: Sitting, Cuff Size: Normal)   Pulse 83   Ht 5\' 1"  (1.549 m)   Wt (!) 463 lb (210 kg)   SpO2 98%   BMI 87.48 kg/m  BMI: Body mass index is 87.48 kg/m.  Vitals:   07/18/22 1449 07/18/22 1524  BP: (!) 149/71 128/70  Pulse: 83   Height: 5\' 1"  (1.549 m)   Weight: (!) 463 lb (210 kg)   SpO2: 98%   BMI (Calculated): 87.53     GEN- The patient is well appearing, alert and oriented x 3 today.   Lungs- Clear to ausculation bilaterally, normal work of breathing.  Heart- Regular rate and rhythm, no murmurs, rubs or gallops Extremities- 1+ peripheral edema, warm, dry   EKG is ordered. Personal review of EKG from today shows:  NSR, rate 83bpm  Recent Labs: 08/10/2021: ALT 22; BUN 16; Creatinine, Ser 1.00; Hemoglobin 11.5; Platelets 232.0; Potassium 4.4; Sodium 138  08/10/2021: Cholesterol 175; HDL 42.20; LDL Cholesterol 111; Total CHOL/HDL Ratio 4; Triglycerides 111.0; VLDL 22.2   CrCl cannot be calculated (Patient's most recent lab result is older than the maximum 21 days allowed.).   Wt Readings from Last 3 Encounters:  07/18/22 (!) 463 lb (210 kg)  05/16/22 (!) 458 lb (207.7 kg)  05/05/22 (!) 458 lb (207.7 kg)     Additional studies reviewed include: Previous EP, cardiology notes.   Long term monitor, 06/15/2021 HR 54 - 203 bpm, average 82 bpm. 36 SVT, longest 15 beats at an average rate of 120 bpm Symptom triggered episodes correspond to SVT. Rare supraventricular and  ventricular ectopy. No sustained arrhythmias.  ASSESSMENT AND PLAN:  #) parox AFib #) SVT Low burden Cont toprol 25mg  daily CHA2DS2-VASc Score = 2 (HTN, gender)  OAC - eliquis 5mg  BID, appropriately dosed     #) OSA CPAP compliance encouraged  #) HTN At goal today.  Recommend checking blood pressures 1-2 times per week at home and recording the values.      Current medicines are reviewed at length with the patient today.   The patient does not have concerns regarding her medicines.  The following changes were made today:  none  Labs/ tests ordered today include:  Orders Placed This Encounter  Procedures   EKG 12-Lead     Disposition: Follow up with Dr. Lalla Brothers or EP APP in 12 months   Signed, Sherie Don, NP  07/18/22  3:24 PM  Electrophysiology [facility]

## 2022-07-18 ENCOUNTER — Encounter: Payer: Self-pay | Admitting: Cardiology

## 2022-07-18 ENCOUNTER — Ambulatory Visit: Payer: 59 | Attending: Cardiology | Admitting: Cardiology

## 2022-07-18 VITALS — BP 128/70 | HR 83 | Ht 61.0 in | Wt >= 6400 oz

## 2022-07-18 DIAGNOSIS — D6869 Other thrombophilia: Secondary | ICD-10-CM

## 2022-07-18 DIAGNOSIS — I10 Essential (primary) hypertension: Secondary | ICD-10-CM | POA: Diagnosis not present

## 2022-07-18 DIAGNOSIS — G4733 Obstructive sleep apnea (adult) (pediatric): Secondary | ICD-10-CM | POA: Diagnosis not present

## 2022-07-18 DIAGNOSIS — I471 Supraventricular tachycardia, unspecified: Secondary | ICD-10-CM | POA: Diagnosis not present

## 2022-07-18 DIAGNOSIS — I480 Paroxysmal atrial fibrillation: Secondary | ICD-10-CM | POA: Diagnosis not present

## 2022-07-18 NOTE — Patient Instructions (Signed)
Medication Instructions:  Your physician recommends that you continue on your current medications as directed. Please refer to the Current Medication list given to you today.  *If you need a refill on your cardiac medications before your next appointment, please call your pharmacy*  Lab Work: No labs ordered  If you have labs (blood work) drawn today and your tests are completely normal, you will receive your results only by: MyChart Message (if you have MyChart) OR A paper copy in the mail If you have any lab test that is abnormal or we need to change your treatment, we will call you to review the results.  Testing/Procedures: No testing ordered  Follow-Up: At [facility], you and your health needs are our priority.  As part of our continuing mission to provide you with exceptional heart care, we have created designated Provider Care Teams.  These Care Teams include your primary Cardiologist (physician) and Advanced Practice Providers (APPs -  Physician Assistants and Nurse Practitioners) who all work together to provide you with the care you need, when you need it.  We recommend signing up for the patient portal called "MyChart".  Sign up information is provided on this After Visit Summary.  MyChart is used to connect with patients for Virtual Visits (Telemedicine).  Patients are able to view lab/test results, encounter notes, upcoming appointments, etc.  Non-urgent messages can be sent to your provider as well.   To learn more about what you can do with MyChart, go to [email or web address].    Your next appointment:   1 year(s)  Provider:   Cameron Lambert, MD or Suzann Riddle, NP 

## 2022-07-19 ENCOUNTER — Other Ambulatory Visit (INDEPENDENT_AMBULATORY_CARE_PROVIDER_SITE_OTHER): Payer: 59

## 2022-07-19 DIAGNOSIS — E118 Type 2 diabetes mellitus with unspecified complications: Secondary | ICD-10-CM

## 2022-07-19 DIAGNOSIS — I10 Essential (primary) hypertension: Secondary | ICD-10-CM | POA: Diagnosis not present

## 2022-07-19 LAB — LIPID PANEL
Cholesterol: 181 mg/dL (ref 0–200)
HDL: 44.1 mg/dL (ref >39.00)
LDL Cholesterol: 117 mg/dL — ABNORMAL HIGH (ref 0–99)
NonHDL: 136.93
Total CHOL/HDL Ratio: 4
Triglycerides: 98 mg/dL (ref 0.0–149.0)
VLDL: 19.6 mg/dL (ref 0.0–40.0)

## 2022-07-19 LAB — CBC WITH DIFFERENTIAL/PLATELET
Basophils Absolute: 0 10*3/uL (ref 0.0–0.1)
Basophils Relative: 0.3 % (ref 0.0–3.0)
Eosinophils Absolute: 0.2 10*3/uL (ref 0.0–0.7)
Eosinophils Relative: 1.9 % (ref 0.0–5.0)
HCT: 38.4 % (ref 36.0–46.0)
Hemoglobin: 12.2 g/dL (ref 12.0–15.0)
Lymphocytes Relative: 21.3 % (ref 12.0–46.0)
Lymphs Abs: 2.1 10*3/uL (ref 0.7–4.0)
MCHC: 31.9 g/dL (ref 30.0–36.0)
MCV: 86.7 fl (ref 78.0–100.0)
Monocytes Absolute: 0.6 10*3/uL (ref 0.1–1.0)
Monocytes Relative: 6.1 % (ref 3.0–12.0)
Neutro Abs: 6.9 10*3/uL (ref 1.4–7.7)
Neutrophils Relative %: 70.4 % (ref 43.0–77.0)
Platelets: 239 10*3/uL (ref 150.0–400.0)
RBC: 4.43 Mil/uL (ref 3.87–5.11)
RDW: 17.1 % — ABNORMAL HIGH (ref 11.5–15.5)
WBC: 9.9 10*3/uL (ref 4.0–10.5)

## 2022-07-19 LAB — COMPREHENSIVE METABOLIC PANEL
ALT: 18 U/L (ref 0–35)
AST: 13 U/L (ref 0–37)
Albumin: 4 g/dL (ref 3.5–5.2)
Alkaline Phosphatase: 52 U/L (ref 39–117)
BUN: 21 mg/dL (ref 6–23)
CO2: 23 mEq/L (ref 19–32)
Calcium: 9.2 mg/dL (ref 8.4–10.5)
Chloride: 103 mEq/L (ref 96–112)
Creatinine, Ser: 0.97 mg/dL (ref 0.40–1.20)
GFR: 66.77 mL/min (ref >60.00)
Glucose, Bld: 125 mg/dL — ABNORMAL HIGH (ref 70–99)
Potassium: 4.3 mEq/L (ref 3.5–5.1)
Sodium: 139 mEq/L (ref 135–145)
Total Bilirubin: 0.8 mg/dL (ref 0.2–1.2)
Total Protein: 6.3 g/dL (ref 6.0–8.3)

## 2022-07-19 LAB — VITAMIN D 25 HYDROXY (VIT D DEFICIENCY, FRACTURES): VITD: 37.84 ng/mL (ref 30.00–100.00)

## 2022-07-19 LAB — MICROALBUMIN / CREATININE URINE RATIO
Creatinine,U: 128.3 mg/dL
Microalb Creat Ratio: 0.8 mg/g (ref 0.0–30.0)
Microalb, Ur: 1 mg/dL (ref 0.0–1.9)

## 2022-07-19 LAB — TSH: TSH: 1.27 u[IU]/mL (ref 0.35–5.50)

## 2022-07-19 LAB — HEMOGLOBIN A1C: Hgb A1c MFr Bld: 6 % (ref 4.6–6.5)

## 2022-07-19 LAB — VITAMIN B12: Vitamin B-12: 454 pg/mL (ref 211–911)

## 2022-07-25 ENCOUNTER — Encounter: Payer: Self-pay | Admitting: Family Medicine

## 2022-07-26 ENCOUNTER — Other Ambulatory Visit (HOSPITAL_COMMUNITY): Payer: Self-pay

## 2022-07-26 ENCOUNTER — Encounter: Payer: Self-pay | Admitting: Family Medicine

## 2022-07-26 ENCOUNTER — Other Ambulatory Visit: Payer: Self-pay

## 2022-07-26 ENCOUNTER — Ambulatory Visit: Payer: 59 | Admitting: Family Medicine

## 2022-07-26 VITALS — BP 128/80 | HR 82 | Temp 98.4°F | Resp 18 | Ht 61.0 in | Wt >= 6400 oz

## 2022-07-26 DIAGNOSIS — E785 Hyperlipidemia, unspecified: Secondary | ICD-10-CM

## 2022-07-26 DIAGNOSIS — E669 Obesity, unspecified: Secondary | ICD-10-CM

## 2022-07-26 DIAGNOSIS — R7989 Other specified abnormal findings of blood chemistry: Secondary | ICD-10-CM

## 2022-07-26 DIAGNOSIS — G4733 Obstructive sleep apnea (adult) (pediatric): Secondary | ICD-10-CM

## 2022-07-26 DIAGNOSIS — I480 Paroxysmal atrial fibrillation: Secondary | ICD-10-CM | POA: Diagnosis not present

## 2022-07-26 DIAGNOSIS — R0981 Nasal congestion: Secondary | ICD-10-CM

## 2022-07-26 DIAGNOSIS — D329 Benign neoplasm of meninges, unspecified: Secondary | ICD-10-CM

## 2022-07-26 DIAGNOSIS — G629 Polyneuropathy, unspecified: Secondary | ICD-10-CM

## 2022-07-26 DIAGNOSIS — J0110 Acute frontal sinusitis, unspecified: Secondary | ICD-10-CM

## 2022-07-26 DIAGNOSIS — Z6841 Body Mass Index (BMI) 40.0 and over, adult: Secondary | ICD-10-CM | POA: Diagnosis not present

## 2022-07-26 DIAGNOSIS — R7303 Prediabetes: Secondary | ICD-10-CM

## 2022-07-26 DIAGNOSIS — H6993 Unspecified Eustachian tube disorder, bilateral: Secondary | ICD-10-CM

## 2022-07-26 DIAGNOSIS — R002 Palpitations: Secondary | ICD-10-CM

## 2022-07-26 DIAGNOSIS — N938 Other specified abnormal uterine and vaginal bleeding: Secondary | ICD-10-CM

## 2022-07-26 DIAGNOSIS — I10 Essential (primary) hypertension: Secondary | ICD-10-CM

## 2022-07-26 DIAGNOSIS — I872 Venous insufficiency (chronic) (peripheral): Secondary | ICD-10-CM

## 2022-07-26 DIAGNOSIS — Z1231 Encounter for screening mammogram for malignant neoplasm of breast: Secondary | ICD-10-CM

## 2022-07-26 DIAGNOSIS — F39 Unspecified mood [affective] disorder: Secondary | ICD-10-CM | POA: Diagnosis not present

## 2022-07-26 DIAGNOSIS — E8881 Metabolic syndrome: Secondary | ICD-10-CM

## 2022-07-26 MED ORDER — FLUTICASONE PROPIONATE 50 MCG/ACT NA SUSP
2.0000 | Freq: Every day | NASAL | 2 refills | Status: AC | PRN
Start: 2022-07-26 — End: ?
  Filled 2022-07-26: qty 16, 30d supply, fill #0
  Filled 2022-08-29 (×2): qty 16, 30d supply, fill #1
  Filled 2022-10-18: qty 16, 30d supply, fill #2

## 2022-07-26 NOTE — Patient Instructions (Addendum)
It was a pleasure meeting you today. Thank you for allowing me to take part in your health care.  Our goals for today as we discussed include:  Look at [facility] website to see if this may be an option for you with your weight loss. [facility] [address] [place] [phone]   Consider bariatric surgery.   [facility].call [phone]  Referral sent for Mammogram. Please call to schedule appointment. [facility] [address] [place], [place] [address] [phone]   PAP due 12/2022  Tetanus due 2029  Recommend Shingles vaccine.  This is a 2 dose series and can be given at your local pharmacy.  Please talk to your pharmacist about this.   Restart Flonase 2 sprays daily    If you have any questions or concerns, please do not hesitate to call the office at [phone].  I look forward to our next visit and until then take care and stay safe.  Regards,   Dana Allan, MD   [facility]

## 2022-07-26 NOTE — Progress Notes (Signed)
SUBJECTIVE:   Chief Complaint  Patient presents with   transfer care   HPI Presents to clinic to transfer care.  No acute concerns today.  Hypertension Asymptomatic.  Currently on Zestril 30 mg daily, metoprolol XL 25 mg daily and hydrochlorothiazide 25 mg daily.  Tolerating medication well.  Denies any headaches, visual changes, chest pain, shortness of breath or lower extremity edema.  Hyperlipidemia Takes omega fatty acids daily.    OSA  Compliant with CPAP at night.  Tolerating well.  Denies any worsening shortness of breath, chest pain, increased fatigue.  Peripheral neuropathy Managed with  gabapentin 400 mg nightly and alpha lipoic acid 600 mg daily.  Tolerating medication well.  Follows with Dr. Sherryll Burger, neurology at [facility].  Requires use of assistive devices, walker/cane.  History of occipital meningioma Status post left occipital craniotomy 07/22.  Doing well.  Recent MRI brain 10/2021 suggesting postsurgical changes reflecting left parieto-occipital meningioma resection.  No abnormal enhancement at resection site to suggest residual or recurrent meningioma.  Reports next scheduled MRI brain fall 2024.  She follows with Dr. Maurice Small, neurosurgery at [facility] neurosurgery in [place].  Paroxysmal A-fib. Status post cardioversion 11/22.  Now rate controlled on metoprolol XL 25 mg daily.  Denies any further episodes of palpitations, or increased heart rate episodes.  She is tolerating Eliquis 5 mg twice daily without any signs of bleeding.  Follows with Dr. Lalla Brothers, EP.  Mood disorder Follows with EACP therapy.  Currently on Prozac 60 mg daily.  Denies any SI/HI.  Endorses increased stress at home with caring for elderly mother with disabilities.  Endorses increased frustration and irritability.  Was also prescribed Wellbutrin 150 SR twice daily by [facility] for binge eating/depression.  Had tolerated medication well however patient self discontinued  Ozempic and Wellbutrin after discovering neck mass.  Reports concern for side effects of medications.  Is open to restarting Wellbutrin in future.  Morbid obesity Previously tried [employer], Wellbutrin SR.  Tolerating medications well however unable to continue Ozempic secondary to financial reasons.  Self discontinued Ozempic and Wellbutrin secondary to concern for neck mass.  Workup at that time revealed 7 mm ovoid nodule in the right parotid gland.  Since then has resolved but had never resumed care at [facility] so medication was not restarted.  She is now interested in continuing healthy lifestyle.  Endorses emotional eating and orts food is her go to weight increased stress.    PERTINENT PMH / PSH: Morbid obesity Hypertension Mood disorder P.A-fib SVT OSA Peripheral neuropathy Hyperlipidemia Prediabetes Chronic pain History of meningioma  OBJECTIVE:  BP 128/80   Pulse 82   Temp 98.4 F (36.9 C) (Oral)   Resp 18   Ht 5\' 1"  (1.549 m)   Wt (!) 463 lb (210 kg)   SpO2 98%   BMI 87.48 kg/m    Physical Exam Vitals reviewed.  Constitutional:      General: She is not in acute distress.    Appearance: She is obese. She is not ill-appearing.  HENT:     Head: Normocephalic.     Right Ear: Tympanic membrane, ear canal and external ear normal.     Left Ear: Tympanic membrane, ear canal and external ear normal.     Nose: Nose normal.  Eyes:     Conjunctiva/sclera: Conjunctivae normal.  Neck:     Thyroid: No thyromegaly or thyroid tenderness.  Cardiovascular:     Rate and Rhythm: Normal rate and regular  rhythm.     Heart sounds: Normal heart sounds.  Pulmonary:     Effort: Pulmonary effort is normal.     Breath sounds: Normal breath sounds.  Abdominal:     General: Abdomen is flat. Bowel sounds are normal.     Palpations: Abdomen is soft.  Musculoskeletal:        General: Normal range of motion.     Cervical back: Normal range of motion.  Skin:     General: Skin is warm and dry.  Neurological:     Mental Status: She is alert and oriented to person, place, and time. Mental status is at baseline.  Psychiatric:        Mood and Affect: Mood normal.        Behavior: Behavior normal.        Thought Content: Thought content normal.        Judgment: Judgment normal.       07/26/2022    1:47 PM 05/16/2022    8:25 AM 12/06/2021    3:34 PM 08/10/2021    8:28 AM 01/20/2021    8:08 AM  Depression screen PHQ 2/9  Decreased Interest 1 0 0 2 1  Down, Depressed, Hopeless 1 1 0 2 1  PHQ - 2 Score 2 1 0 4 2  Altered sleeping 1   1   Tired, decreased energy 1   2   Change in appetite 1   2   Feeling bad or failure about yourself  1   3   Trouble concentrating 1   0   Moving slowly or fidgety/restless 0   0   Suicidal thoughts 0   0   PHQ-9 Score 7   12   Difficult doing work/chores Not difficult at all   Somewhat difficult         07/26/2022    1:48 PM 08/10/2021    9:33 AM 07/02/2019    9:32 AM 06/11/2019    3:13 PM  GAD 7 : Generalized Anxiety Score  Nervous, Anxious, on Edge 2 1 0 0  Control/stop worrying 1 1 0 0  Worry too much - different things 1 1 0 0  Trouble relaxing 0 1 0 0  Restless 0 1 0 0  Easily annoyed or irritable 2 1 0 1  Afraid - awful might happen  1 0 0  Total GAD 7 Score  7 0 1  Anxiety Difficulty Not difficult at all Somewhat difficult Not difficult at all Somewhat difficult      ASSESSMENT/PLAN:  Morbid obesity with BMI of 70 and over, adult [facility]) Assessment & Plan: Chronic.  Continues to struggle with increasing weight.  Contributing factors include chronic pain, increased stress, current medication side effects. Previously followed by Dr. Manson Passey at [facility] in [place]. Consider reinitiation of Wellbutrin Would benefit from reestablishing at weight loss center Would benefit from reinitiation of GLP-1, unfortunately unable to get financial coverage at this time. Would benefit from referral  to bariatric surgery for evaluation. Offered nutrition consult.  Patient to consider. Consider switching SSRI given Prozac side effect of increased weight Consider having patient discuss other options with her neurologist for neuropathy given increased weight as side effect of gabapentin. Encourage healthy nutrition choices and increase activity as tolerated   Mood disorder (HCC) Assessment & Plan: Chronic.  PHQ-9/GAD negative.  Doing okay on current medication.  Reports some increased stress, frustration and irritability.  Caring for elderly mother with significant disabilities.  Endorses emotional eating which in turn increases more stress as she is concerned about increased weight.  Denies any SI/HI. Continue Prozac 60 mg daily. Consider switching SSRI to 1 with lesser side effect of weight gain. Consider Wellbutrin in future. Continue EACP therapy Follow-up as needed Strict return precautions provided    Meningioma [facility]) Assessment & Plan: Chronic.  Status post left parieto-occipital craniotomy 07/22. Doing well.  Asymptomatic.  Recent MRI head 08/23 did not show any residual or recurrent meningioma at resection site. Annual MRI brain due fall 2024 Follow-up with Dr. Maurice Small at [facility] neurosurgery    Paroxysmal atrial fibrillation [facility]) Assessment & Plan: Chronic.  Controlled rate status post ablation 11/22.  Doing well on current medications. Continue metoprolol XL 25 mg daily On appropriate dose of Eliquis 5 mg daily with no signs of bleeding. Continue to follow-up with EP as scheduled.   Breast cancer screening by mammogram -     3D Screening Mammogram, Left and Right; Future  Nasal congestion Assessment & Plan: Chronic.   Refill Flonase 2 sprays daily   Orders: -     Fluticasone Propionate; Place 2 sprays into both nostrils daily as needed for allergies or rhinitis.  Dispense: 16 g; Refill: 2  Low vitamin D level -     Vitamin D (Ergocalciferol); Take 1  capsule (50,000 Units total) by mouth every 7 (seven) days.  Dispense: 12 capsule; Refill: 1  Hyperlipidemia LDL goal <100 Assessment & Plan: Chronic.  Takes OTC omega fatty acids daily Recent LDL 117, goal less than 100.  10-year ASCVD risk mace low 2.6%.  No indication for statin therapy Continue omega-3 fatty acids 1000 mg daily Recheck lipids annually   Primary hypertension Assessment & Plan: Chronic.  Well-controlled on current medications.  Recent creatinine within normal limits Continue hydrochlorothiazide 25 mg daily Continue metoprolol XL 25 mg daily Continue lisinopril 30 mg daily Monitor blood pressure at home.  Goal less than 150/90 per JNC 8 guidelines    OSA on CPAP Assessment & Plan: Chronic. Continue CPAP at night   Peripheral polyneuropathy Assessment & Plan: Chronic.  Stable. Continue gabapentin 400 mg nightly. Follows with neurology.   Abdominal obesity and metabolic syndrome Assessment & Plan: Chronic.  Meets criteria for metabolic syndrome.  Elevated blood pressure, fasting glucose >100, abdominal obesity >35 in,HDL <50.  TyG index 4.67 indicating insulin resistant. Recent A1c 6.0 Continue metformin 500 mg daily Would benefit from GLP 1 Would benefit from bariatric surgery referral Offered referral to [facility].  Patient to consider Follow-up in 1 to 2 months for continued management      Prediabetes Assessment & Plan: Chronic.  Recent A1c 6.0.  Currently on biguanide and tolerating well.  Vitamin B12 level is normal. Continue metformin 500 mg daily Continue daily multivitamins. Check A1c annually   Palpitations Assessment & Plan: Chronic.  Resolved since increasing beta-blocker. Follows with EP   DUB (dysfunctional uterine bleeding) Assessment & Plan: Chronic.  Stable.  Currently on Provera 2.5 mg daily and tolerating well. Follows with [name] Burke at [facility]     Chronic venous insufficiency of  lower extremity Assessment & Plan: Chronic.  Stable.  History of stasis dermatitis, peripheral neuropathy.  No previous vascular studies. Would benefit from compression stockings or lymphedema wraps. Discussed weight loss management Consider lower extremity dopplers in future.    HCM Last Pap 10/21.  NILM, HPV negative.  Due 10/24.  Follows with OB/GYN.  Recommend she call to schedule appointment  for continued cervical cancer screening. Declined colonoscopy Declined Cologuard  PDMP reviewed  Return if symptoms worsen or fail to improve.  Total of 45 minutes spent with patient, greater than 50% of time spent face to face on counseling and coordination of care, specifically nutritional counseling, stress management, care giver role, discussion of bariatric surgery and benefits to weight loss.     Dana Allan, MD

## 2022-07-28 ENCOUNTER — Other Ambulatory Visit: Payer: Self-pay

## 2022-07-28 ENCOUNTER — Encounter: Payer: Self-pay | Admitting: Family Medicine

## 2022-07-31 ENCOUNTER — Other Ambulatory Visit (HOSPITAL_COMMUNITY): Payer: Self-pay

## 2022-07-31 MED ORDER — VITAMIN D (ERGOCALCIFEROL) 1.25 MG (50000 UNIT) PO CAPS
50000.0000 [IU] | ORAL_CAPSULE | ORAL | 1 refills | Status: DC
Start: 2022-07-31 — End: 2023-09-06
  Filled 2022-07-31: qty 12, 84d supply, fill #0

## 2022-07-31 NOTE — Telephone Encounter (Signed)
I can't find anything in OV note or recent labs about starting VitD rx. Please advise, thanks!

## 2022-08-12 ENCOUNTER — Encounter: Payer: Self-pay | Admitting: Family Medicine

## 2022-08-12 DIAGNOSIS — R7989 Other specified abnormal findings of blood chemistry: Secondary | ICD-10-CM | POA: Insufficient documentation

## 2022-08-12 DIAGNOSIS — Z1231 Encounter for screening mammogram for malignant neoplasm of breast: Secondary | ICD-10-CM | POA: Insufficient documentation

## 2022-08-12 DIAGNOSIS — R0981 Nasal congestion: Secondary | ICD-10-CM | POA: Insufficient documentation

## 2022-08-12 DIAGNOSIS — E669 Obesity, unspecified: Secondary | ICD-10-CM | POA: Insufficient documentation

## 2022-08-12 NOTE — Assessment & Plan Note (Signed)
Chronic.  Controlled rate status post ablation 11/22.  Doing well on current medications. Continue metoprolol XL 25 mg daily On appropriate dose of Eliquis 5 mg daily with no signs of bleeding. Continue to follow-up with EP as scheduled.

## 2022-08-12 NOTE — Assessment & Plan Note (Signed)
Chronic.  Stable.  Currently on Provera 2.5 mg daily and tolerating well. Follows with [name] Burke at [facility]

## 2022-08-12 NOTE — Assessment & Plan Note (Signed)
Chronic.  PHQ-9/GAD negative.  Doing okay on current medication.  Reports some increased stress, frustration and irritability.  Caring for elderly mother with significant disabilities.  Endorses emotional eating which in turn increases more stress as she is concerned about increased weight.  Denies any SI/HI. Continue Prozac 60 mg daily. Consider switching SSRI to 1 with lesser side effect of weight gain. Consider Wellbutrin in future. Continue EACP therapy Follow-up as needed Strict return precautions provided

## 2022-08-12 NOTE — Assessment & Plan Note (Signed)
Chronic.  Well-controlled on current medications.  Recent creatinine within normal limits Continue hydrochlorothiazide 25 mg daily Continue metoprolol XL 25 mg daily Continue lisinopril 30 mg daily Monitor blood pressure at home.  Goal less than 150/90 per JNC 8 guidelines

## 2022-08-12 NOTE — Assessment & Plan Note (Signed)
Chronic.  Stable. Continue gabapentin 400 mg nightly. Follows with neurology.

## 2022-08-12 NOTE — Assessment & Plan Note (Signed)
Chronic.  Resolved since increasing beta-blocker. Follows with EP

## 2022-08-12 NOTE — Assessment & Plan Note (Addendum)
Chronic.  Takes OTC omega fatty acids daily Recent LDL 117, goal less than 100.  10-year ASCVD risk mace low 2.6%.  No indication for statin therapy Continue omega-3 fatty acids 1000 mg daily Recheck lipids annually

## 2022-08-12 NOTE — Assessment & Plan Note (Signed)
Chronic.  Continue CPAP at night. 

## 2022-08-12 NOTE — Assessment & Plan Note (Signed)
Chronic.  Status post left parieto-occipital craniotomy 07/22. Doing well.  Asymptomatic.  Recent MRI head 08/23 did not show any residual or recurrent meningioma at resection site. Annual MRI brain due fall 2024 Follow-up with Dr. Maurice Small at [facility] neurosurgery

## 2022-08-12 NOTE — Assessment & Plan Note (Signed)
Chronic.   Refill Flonase 2 sprays daily

## 2022-08-12 NOTE — Assessment & Plan Note (Signed)
Chronic.  Stable.  History of stasis dermatitis, peripheral neuropathy.  No previous vascular studies. Would benefit from compression stockings or lymphedema wraps. Discussed weight loss management Consider lower extremity dopplers in future.

## 2022-08-12 NOTE — Assessment & Plan Note (Signed)
Chronic.  Meets criteria for metabolic syndrome.  Elevated blood pressure, fasting glucose >100, abdominal obesity >35 in,HDL <50.  TyG index 4.67 indicating insulin resistant. Recent A1c 6.0 Continue metformin 500 mg daily Would benefit from GLP 1 Would benefit from bariatric surgery referral Offered referral to [facility].  Patient to consider Follow-up in 1 to 2 months for continued management

## 2022-08-12 NOTE — Assessment & Plan Note (Signed)
Chronic.  Recent A1c 6.0.  Currently on biguanide and tolerating well.  Vitamin B12 level is normal. Continue metformin 500 mg daily Continue daily multivitamins. Check A1c annually

## 2022-08-12 NOTE — Assessment & Plan Note (Addendum)
Chronic.  Continues to struggle with increasing weight.  Contributing factors include chronic pain, increased stress, current medication side effects. Previously followed by Dr. Manson Passey at [facility] in [place]. Consider reinitiation of Wellbutrin Would benefit from reestablishing at weight loss center Would benefit from reinitiation of GLP-1, unfortunately unable to get financial coverage at this time. Would benefit from referral to bariatric surgery for evaluation. Offered nutrition consult.  Patient to consider. Consider switching SSRI given Prozac side effect of increased weight Consider having patient discuss other options with her neurologist for neuropathy given increased weight as side effect of gabapentin. Encourage healthy nutrition choices and increase activity as tolerated

## 2022-08-14 ENCOUNTER — Other Ambulatory Visit: Payer: Self-pay | Admitting: Cardiology

## 2022-08-14 DIAGNOSIS — I480 Paroxysmal atrial fibrillation: Secondary | ICD-10-CM

## 2022-08-15 ENCOUNTER — Other Ambulatory Visit: Payer: Self-pay

## 2022-08-15 MED ORDER — APIXABAN 5 MG PO TABS
5.0000 mg | ORAL_TABLET | Freq: Two times a day (BID) | ORAL | 1 refills | Status: DC
Start: 2022-08-15 — End: 2023-02-13
  Filled 2022-08-15: qty 180, 90d supply, fill #0
  Filled 2022-11-22: qty 180, 90d supply, fill #1

## 2022-08-15 NOTE — Telephone Encounter (Signed)
Prescription refill request for Eliquis received. Indication:afib Last office visit:4/24 Scr:0.9 5/24 Age: 52 Weight:210  kg  Prescription refilled

## 2022-08-29 ENCOUNTER — Other Ambulatory Visit (HOSPITAL_COMMUNITY): Payer: Self-pay

## 2022-08-29 ENCOUNTER — Encounter: Payer: Self-pay | Admitting: Family Medicine

## 2022-08-29 DIAGNOSIS — F419 Anxiety disorder, unspecified: Secondary | ICD-10-CM

## 2022-09-01 ENCOUNTER — Other Ambulatory Visit (HOSPITAL_COMMUNITY): Payer: Self-pay

## 2022-09-01 ENCOUNTER — Other Ambulatory Visit: Payer: Self-pay

## 2022-09-01 MED ORDER — FLUOXETINE HCL 20 MG PO CAPS
60.0000 mg | ORAL_CAPSULE | Freq: Every day | ORAL | 1 refills | Status: DC
Start: 2022-09-01 — End: 2023-01-30
  Filled 2022-09-01 – 2022-09-25 (×3): qty 270, 90d supply, fill #0
  Filled 2022-12-20: qty 270, 90d supply, fill #1

## 2022-09-18 ENCOUNTER — Other Ambulatory Visit: Payer: Self-pay

## 2022-09-25 ENCOUNTER — Other Ambulatory Visit (HOSPITAL_COMMUNITY): Payer: Self-pay

## 2022-09-25 ENCOUNTER — Other Ambulatory Visit: Payer: Self-pay

## 2022-09-25 ENCOUNTER — Other Ambulatory Visit (HOSPITAL_BASED_OUTPATIENT_CLINIC_OR_DEPARTMENT_OTHER): Payer: Self-pay

## 2022-10-12 ENCOUNTER — Ambulatory Visit: Payer: 59 | Admitting: Podiatry

## 2022-10-18 ENCOUNTER — Other Ambulatory Visit: Payer: Self-pay

## 2022-10-19 ENCOUNTER — Encounter: Payer: Self-pay | Admitting: Podiatry

## 2022-10-19 ENCOUNTER — Ambulatory Visit (INDEPENDENT_AMBULATORY_CARE_PROVIDER_SITE_OTHER): Payer: 59 | Admitting: Podiatry

## 2022-10-19 DIAGNOSIS — G629 Polyneuropathy, unspecified: Secondary | ICD-10-CM

## 2022-10-19 DIAGNOSIS — M79674 Pain in right toe(s): Secondary | ICD-10-CM

## 2022-10-19 DIAGNOSIS — L603 Nail dystrophy: Secondary | ICD-10-CM

## 2022-10-19 DIAGNOSIS — M79675 Pain in left toe(s): Secondary | ICD-10-CM

## 2022-10-30 NOTE — Progress Notes (Signed)
  Subjective:  Patient ID: Belinda Garrett, female    DOB: 07-Jan-1971,  MRN: [id number]  Belinda Garrett presents to clinic today for: at risk foot care with history of peripheral neuropathy and painful thick toenails that are difficult to trim. Pain interferes with ambulation. Aggravating factors include wearing enclosed shoe gear. Pain is relieved with periodic professional debridement.  Patient is also prediabetic. Chief Complaint  Patient presents with   Nail Problem    DFC,Referring Provider Dana Allan, MD,lov:5/24,A1C:6.0       PCP is Dana Allan, MD.  Allergies  Allergen Reactions   Dust Mite Extract     Sneezing, itchy, watery eyes   Latex     Tingling sensation   Pollen Extract     Sneezing, itchy, watery eyes    Review of Systems: Negative except as noted in the HPI.  Objective: No changes noted in today's physical examination. There were no vitals filed for this visit.  Belinda Garrett is a pleasant 52 y.o. female in NAD. AAO x 3.  Vascular Examination: Capillary refill time <3 seconds b/l LE. Faintly palpable pedal pulses b/l LE. Digital hair present b/l. Pedal edema present b/l. Skin temperature gradient WNL b/l. No varicosities b/l. [name]  Dermatological Examination: Pedal skin with normal turgor, texture and tone b/l. No open wounds. No interdigital macerations b/l. Toenails 1-5 b/l thickened, discolored, dystrophic. There is pain on palpation to dorsal aspect of nailplates. No corns, calluses nor porokeratotic lesions noted..  Neurological Examination: Protective sensation intact with 10 gram monofilament b/l LE. Pt has subjective symptoms of neuropathy.  Musculoskeletal Examination: Muscle strength 5/5 to all lower extremity muscle groups bilaterally. Hammertoe(s) noted to the bilateral 2nd toes, bilateral 3rd toes, and bilateral 4th toes. Pes planus deformity noted bilateral LE.     Latest Ref Rng & Units 07/19/2022    7:35 AM  Hemoglobin A1C   Hemoglobin-A1c 4.6 - 6.5 % 6.0    Assessment/Plan: 1. Nail dystrophy   2. Peripheral polyneuropathy     -Consent given for treatment as described below: -Examined patient. -Discussed treatment options for onychomycosis. Patient opted for topical OTC therapy. Patient is to apply Vick's Vapor rub with cotton tipped applicator to affected toenail(s) once daily. -Toenails 1-5 b/l were debrided in length and girth with sterile nail nippers and dremel without iatrogenic bleeding.  -Patient/POA to call should there be question/concern in the interim.   Return in about 3 months (around 01/19/2023).  Freddie Breech, DPM

## 2022-11-06 ENCOUNTER — Other Ambulatory Visit: Payer: Self-pay

## 2022-11-09 ENCOUNTER — Other Ambulatory Visit: Payer: Self-pay

## 2022-11-22 ENCOUNTER — Other Ambulatory Visit: Payer: Self-pay

## 2022-11-22 ENCOUNTER — Other Ambulatory Visit (HOSPITAL_COMMUNITY): Payer: Self-pay

## 2022-12-13 DIAGNOSIS — M793 Panniculitis, unspecified: Secondary | ICD-10-CM | POA: Diagnosis not present

## 2022-12-13 DIAGNOSIS — L718 Other rosacea: Secondary | ICD-10-CM | POA: Diagnosis not present

## 2022-12-13 DIAGNOSIS — I872 Venous insufficiency (chronic) (peripheral): Secondary | ICD-10-CM | POA: Diagnosis not present

## 2022-12-14 ENCOUNTER — Other Ambulatory Visit (HOSPITAL_COMMUNITY): Payer: Self-pay

## 2022-12-14 MED ORDER — METRONIDAZOLE 0.75 % EX GEL
CUTANEOUS | 6 refills | Status: AC
  Filled 2022-12-14: qty 45, 30d supply, fill #0
  Filled 2023-01-08: qty 45, 30d supply, fill #1
  Filled 2023-04-19: qty 45, 30d supply, fill #2

## 2022-12-14 MED ORDER — DOXYCYCLINE HYCLATE 100 MG PO CAPS
100.0000 mg | ORAL_CAPSULE | Freq: Every day | ORAL | 3 refills | Status: DC
  Filled 2022-12-14: qty 30, 30d supply, fill #0
  Filled 2023-01-08: qty 30, 30d supply, fill #1
  Filled 2023-02-04: qty 30, 30d supply, fill #2
  Filled 2023-03-19: qty 30, 30d supply, fill #3

## 2022-12-20 ENCOUNTER — Other Ambulatory Visit (HOSPITAL_COMMUNITY): Payer: Self-pay

## 2022-12-20 ENCOUNTER — Telehealth: Payer: Self-pay

## 2022-12-20 ENCOUNTER — Other Ambulatory Visit: Payer: Self-pay

## 2022-12-20 ENCOUNTER — Other Ambulatory Visit: Payer: Self-pay | Admitting: Family Medicine

## 2022-12-20 DIAGNOSIS — R0981 Nasal congestion: Secondary | ICD-10-CM

## 2022-12-20 MED ORDER — FLUTICASONE PROPIONATE 50 MCG/ACT NA SUSP
2.0000 | Freq: Every day | NASAL | 2 refills | Status: AC | PRN
Start: 2022-12-20 — End: ?
  Filled 2022-12-20: qty 16, 30d supply, fill #0
  Filled 2023-04-19: qty 16, 30d supply, fill #1
  Filled 2023-05-28: qty 16, 30d supply, fill #2

## 2022-12-20 NOTE — Telephone Encounter (Signed)
Patient sent a message via MyChart to schedule her physical.  Patient states she usually has her labs prior to her physical so they can discuss her results during her visit.  I scheduled appointment for patient to have her labs on 01/23/2023 and her physical with Dr. Dana Allan on 01/30/2023.  I do not see lab orders in the system at this time.

## 2022-12-29 ENCOUNTER — Other Ambulatory Visit: Payer: Self-pay | Admitting: Neurological Surgery

## 2022-12-29 DIAGNOSIS — D496 Neoplasm of unspecified behavior of brain: Secondary | ICD-10-CM

## 2023-01-08 ENCOUNTER — Other Ambulatory Visit: Payer: Self-pay

## 2023-01-15 ENCOUNTER — Ambulatory Visit
Admission: RE | Admit: 2023-01-15 | Discharge: 2023-01-15 | Disposition: A | Discharge status: Home / Self Care | Payer: 59 | Source: Ambulatory Visit | Attending: Family Medicine | Admitting: Family Medicine

## 2023-01-15 DIAGNOSIS — Z1231 Encounter for screening mammogram for malignant neoplasm of breast: Secondary | ICD-10-CM | POA: Insufficient documentation

## 2023-01-19 ENCOUNTER — Telehealth: Payer: Self-pay | Admitting: Family Medicine

## 2023-01-19 NOTE — Telephone Encounter (Signed)
Patient need lab orders.

## 2023-01-22 NOTE — Telephone Encounter (Signed)
Called pt and canceled appointment.

## 2023-01-23 ENCOUNTER — Other Ambulatory Visit: Payer: 59

## 2023-01-23 ENCOUNTER — Encounter: Payer: 59 | Admitting: Family Medicine

## 2023-01-25 ENCOUNTER — Encounter: Payer: 59 | Admitting: Family Medicine

## 2023-01-30 ENCOUNTER — Encounter: Payer: Self-pay | Admitting: Family Medicine

## 2023-01-30 ENCOUNTER — Other Ambulatory Visit (HOSPITAL_COMMUNITY): Payer: Self-pay

## 2023-01-30 ENCOUNTER — Ambulatory Visit (INDEPENDENT_AMBULATORY_CARE_PROVIDER_SITE_OTHER): Payer: 59 | Admitting: Family Medicine

## 2023-01-30 ENCOUNTER — Other Ambulatory Visit: Payer: Self-pay

## 2023-01-30 VITALS — BP 128/78 | HR 83 | Temp 98.2°F | Resp 16 | Ht 61.0 in | Wt >= 6400 oz

## 2023-01-30 DIAGNOSIS — R7303 Prediabetes: Secondary | ICD-10-CM

## 2023-01-30 DIAGNOSIS — I480 Paroxysmal atrial fibrillation: Secondary | ICD-10-CM | POA: Diagnosis not present

## 2023-01-30 DIAGNOSIS — R7989 Other specified abnormal findings of blood chemistry: Secondary | ICD-10-CM

## 2023-01-30 DIAGNOSIS — I10 Essential (primary) hypertension: Secondary | ICD-10-CM | POA: Diagnosis not present

## 2023-01-30 DIAGNOSIS — L719 Rosacea, unspecified: Secondary | ICD-10-CM

## 2023-01-30 DIAGNOSIS — R42 Dizziness and giddiness: Secondary | ICD-10-CM

## 2023-01-30 DIAGNOSIS — G629 Polyneuropathy, unspecified: Secondary | ICD-10-CM | POA: Diagnosis not present

## 2023-01-30 DIAGNOSIS — Z0000 Encounter for general adult medical examination without abnormal findings: Secondary | ICD-10-CM | POA: Diagnosis not present

## 2023-01-30 DIAGNOSIS — F39 Unspecified mood [affective] disorder: Secondary | ICD-10-CM | POA: Diagnosis not present

## 2023-01-30 DIAGNOSIS — E785 Hyperlipidemia, unspecified: Secondary | ICD-10-CM

## 2023-01-30 DIAGNOSIS — Z6841 Body Mass Index (BMI) 40.0 and over, adult: Secondary | ICD-10-CM

## 2023-01-30 LAB — VITAMIN D 25 HYDROXY (VIT D DEFICIENCY, FRACTURES): VITD: 51.12 ng/mL (ref 30.00–100.00)

## 2023-01-30 MED ORDER — METOPROLOL SUCCINATE ER 25 MG PO TB24
25.0000 mg | ORAL_TABLET | Freq: Every day | ORAL | 3 refills | Status: DC
  Filled 2023-01-30 – 2023-02-13 (×2): qty 90, 90d supply, fill #0
  Filled 2023-05-11: qty 90, 90d supply, fill #1
  Filled 2023-08-20: qty 90, 90d supply, fill #2
  Filled 2023-11-14: qty 90, 90d supply, fill #3

## 2023-01-30 MED ORDER — FLUOXETINE HCL 20 MG PO CAPS
60.0000 mg | ORAL_CAPSULE | Freq: Every day | ORAL | 3 refills | Status: DC
  Filled 2023-01-30 – 2023-03-19 (×2): qty 270, 90d supply, fill #0
  Filled 2023-06-18: qty 270, 90d supply, fill #1
  Filled 2023-09-10: qty 270, 90d supply, fill #2
  Filled 2023-12-10: qty 270, 90d supply, fill #3

## 2023-01-30 MED ORDER — HYDROCHLOROTHIAZIDE 25 MG PO TABS
25.0000 mg | ORAL_TABLET | Freq: Every day | ORAL | 3 refills | Status: DC
  Filled 2023-01-30: qty 90, 90d supply, fill #0
  Filled 2023-05-28: qty 90, 90d supply, fill #1
  Filled 2023-09-10: qty 90, 90d supply, fill #2

## 2023-01-30 MED ORDER — LISINOPRIL 30 MG PO TABS
30.0000 mg | ORAL_TABLET | Freq: Every day | ORAL | 3 refills | Status: DC
  Filled 2023-01-30: qty 90, 90d supply, fill #0
  Filled 2023-05-11: qty 90, 90d supply, fill #1
  Filled 2023-08-20: qty 90, 90d supply, fill #2
  Filled 2023-11-14: qty 90, 90d supply, fill #3

## 2023-01-30 MED ORDER — METFORMIN HCL 500 MG PO TABS
500.0000 mg | ORAL_TABLET | Freq: Every day | ORAL | 3 refills | Status: DC
  Filled 2023-01-30: qty 90, 90d supply, fill #0
  Filled 2023-04-19: qty 90, 90d supply, fill #1
  Filled 2023-07-23: qty 90, 90d supply, fill #2
  Filled 2023-09-26 – 2023-10-13 (×2): qty 90, 90d supply, fill #3

## 2023-01-30 NOTE — Patient Instructions (Addendum)
It was a pleasure meeting you today. Thank you for allowing me to take part in your health care.  Our goals for today as we discussed include:  Check vitamin d level today  [email or web address] [address] [place] [phone]   [facility] [place]  [address] [place] [address] [phone]    [email or web address]   Consider for weight loss Delrae Rend MD [facility] [place], [place]  [phone] Open ? Closes 4:30?PM  This is a list of the screening recommended for you and due dates:  Health Maintenance  Topic Date Due   Colon Cancer Screening  Never done   COVID-19 Vaccine (5 - 2023-24 season) 11/19/2022   Mammogram  01/15/2024   Pap with HPV screening  01/13/2025   DTaP/Tdap/Td vaccine (2 - Td or Tdap) 09/06/2027   Flu Shot  Completed   Zoster (Shingles) Vaccine  Completed   HPV Vaccine  Aged Out   Hepatitis C Screening  Discontinued   HIV Screening  Discontinued    Follow up as needed  If you have any questions or concerns, please do not hesitate to call the office at [phone].  I look forward to our next visit and until then take care and stay safe.  Regards,   Dana Allan, MD   [facility]

## 2023-01-30 NOTE — Progress Notes (Signed)
SUBJECTIVE:   Chief Complaint  Patient presents with   Annual Exam   HPI Presents for annual visit  Discussed the use of AI scribe software for clinical note transcription with the patient, who gave verbal consent to proceed.  History of Present Illness The patient, a 52 year old individual with a history of rosacea, vertigo, and neuropathy, presented for an annual check-up. She reported a recent increase in her Vitamin D3 dosage, which was initially prescribed due to consistently low levels. The patient noted a subjective improvement in her vertigo symptoms with daily Vitamin D supplementation, but experienced discomfort when switching to a higher dose once a week.  The patient also discussed her ongoing management of rosacea with doxycycline and Metrogel, prescribed by her dermatologist. She reported a noticeable improvement in her skin condition with this regimen. However, she experienced a recurrence of breakouts after a brief period of discontinuation, prompting her to restart the medication.  Regarding her mental health, the patient disclosed a long-term use of Prozac, approximately 20 years, for mood regulation. She expressed recent episodes of increased emotional lability, which she attributed to potential hormonal changes. Despite these challenges, the patient expressed a desire to manage her emotional health without increasing medication or becoming "numb."  The patient's neuropathy is managed with gabapentin at night and alpha lipoic acid in the morning. She also takes glucosamine, an over-the-counter supplement, though it's unclear if this is for joint health or another condition. The patient's hypertension is managed with hydrochlorothiazide and lisinopril, and she is also on metformin, though the indication for this medication was not discussed.  The patient also reported a history of atrial fibrillation following surgery, for which she is maintained on Eliquis and metoprolol.  She is due for a follow-up MRI in the near future to monitor a previously identified growth.  Lastly, the patient mentioned a recent increase in shoulder discomfort, which she attributed to a need for more exercise. She did not express a desire for immediate intervention for this issue.    PERTINENT PMH / PSH: As above  OBJECTIVE:  BP 128/78   Pulse 83   Temp 98.2 F (36.8 C)   Resp 16   Ht 5\' 1"  (1.549 m)   Wt (!) 456 lb (206.8 kg)   SpO2 97%   BMI 86.16 kg/m    Physical Exam Vitals reviewed.  Constitutional:      General: She is not in acute distress.    Appearance: She is obese. She is not ill-appearing.  HENT:     Head: Normocephalic.     Right Ear: Tympanic membrane, ear canal and external ear normal.     Left Ear: Tympanic membrane, ear canal and external ear normal.     Nose: Nose normal.     Mouth/Throat:     Mouth: Mucous membranes are moist.  Eyes:     Extraocular Movements: Extraocular movements intact.     Conjunctiva/sclera: Conjunctivae normal.     Pupils: Pupils are equal, round, and reactive to light.  Neck:     Thyroid: No thyromegaly or thyroid tenderness.     Vascular: No carotid bruit.  Cardiovascular:     Rate and Rhythm: Normal rate and regular rhythm.     Pulses: Normal pulses.     Heart sounds: Normal heart sounds.  Pulmonary:     Effort: Pulmonary effort is normal.     Breath sounds: Normal breath sounds.  Abdominal:     General: Bowel sounds are normal. There is  no distension.     Palpations: Abdomen is soft.     Tenderness: There is no abdominal tenderness. There is no right CVA tenderness, left CVA tenderness, guarding or rebound.  Musculoskeletal:        General: Normal range of motion.     Cervical back: Normal range of motion.     Right lower leg: No edema.     Left lower leg: No edema.  Lymphadenopathy:     Cervical: No cervical adenopathy.  Skin:    Capillary Refill: Capillary refill takes less than 2 seconds.  Neurological:      General: No focal deficit present.     Mental Status: She is alert and oriented to person, place, and time. Mental status is at baseline.     Motor: No weakness.  Psychiatric:        Mood and Affect: Mood normal.        Behavior: Behavior normal.        Thought Content: Thought content normal.        Judgment: Judgment normal.        01/30/2023    8:01 AM 07/26/2022    1:47 PM 05/16/2022    8:25 AM 12/06/2021    3:34 PM 08/10/2021    8:28 AM  Depression screen PHQ 2/9  Decreased Interest 1 1 0 0 2  Down, Depressed, Hopeless 1 1 1  0 2  PHQ - 2 Score 2 2 1  0 4  Altered sleeping 3 1   1   Tired, decreased energy 2 1   2   Change in appetite 1 1   2   Feeling bad or failure about yourself  1 1   3   Trouble concentrating 0 1   0  Moving slowly or fidgety/restless 0 0   0  Suicidal thoughts 0 0   0  PHQ-9 Score 9 7   12   Difficult doing work/chores Somewhat difficult Not difficult at all   Somewhat difficult      01/30/2023    8:01 AM 07/26/2022    1:48 PM 08/10/2021    9:33 AM 07/02/2019    9:32 AM  GAD 7 : Generalized Anxiety Score  Nervous, Anxious, on Edge 1 2 1  0  Control/stop worrying 1 1 1  0  Worry too much - different things 1 1 1  0  Trouble relaxing 1 0 1 0  Restless 0 0 1 0  Easily annoyed or irritable 1 2 1  0  Afraid - awful might happen 1  1 0  Total GAD 7 Score 6  7 0  Anxiety Difficulty Somewhat difficult Not difficult at all Somewhat difficult Not difficult at all    ASSESSMENT/PLAN:  Annual physical exam Assessment & Plan: -Consider Pneumonia vaccine. -Mammogram up to date -Continue regular breast self-exams. -Monitor blood pressure. -Declined Colonoscopy and Cologuard -Declined Hepatitis C and HIV screening. -Reviewed annual blood work completed 07/2022   Essential hypertension -     hydroCHLOROthiazide; Take 1 tablet (25 mg total) by mouth daily.  Dispense: 90 tablet; Refill: 3 -     Lisinopril; Take 1 tablet (30 mg total) by mouth daily.   Dispense: 90 tablet; Refill: 3  Pre-diabetes -     metFORMIN HCl; Take 1 tablet (500 mg total) by mouth daily with breakfast.  Dispense: 90 tablet; Refill: 3  Low vitamin D level Assessment & Plan: Decreased levels despite supplementation. Noted improvement in vertigo symptoms with daily supplementation. -Check Vitamin D levels today. -Consider increasing  daily Vitamin D supplementation.  Orders: -     VITAMIN D 25 Hydroxy (Vit-D Deficiency, Fractures)  Mood disorder (HCC) Assessment & Plan: Long-term management with Prozac. Recent increase in emotional lability and weepiness. -Consider psychiatry referral for medication evaluation. -Consider increasing therapy sessions. -Consider participation in stress and mindful wellness program.  Orders: -     FLUoxetine HCl; Take 3 capsules (60 mg total) by mouth daily. (stop 40mg  dose)  Dispense: 270 capsule; Refill: 3  Paroxysmal atrial fibrillation (HCC) Assessment & Plan: Managed with Metoprolol and On Eliquis for stroke prevention. -Continue current management. -Follow up with Cardiology as scheduled  Orders: -     Metoprolol Succinate ER; Take 1 tablet (25 mg total) by mouth daily.  Dispense: 90 tablet; Refill: 3  Primary hypertension Assessment & Plan: On lisinopril, metoprolol and hydrochlorothiazide for management. -Continue current management.   Rosacea Assessment & Plan: On doxycycline and Metrogel for management. Noted recurrence of breakouts after discontinuation of doxycycline. -Continue current management.   Morbid obesity with BMI of 70 and over, adult [facility]) Assessment & Plan: -Consider weight loss options including Delrae Rend MD, [place], [place].  -Encouraged healthy diet and modified activity   Peripheral polyneuropathy Assessment & Plan: On gabapentin and alpha lipoic acid for management. -Continue current management.   Vertigo Assessment & Plan: Improvement noted with daily Vitamin D  supplementation. -Continue current management.   Hyperlipidemia LDL goal <100 Assessment & Plan: The 10-year ASCVD risk score (Arnett DK, et al., 2019) is: 4.8%  -Not interested in statin therapy at this time. -Repeat lipids annually    PDMP reviewed  Return if symptoms worsen or fail to improve, for PCP.  Dana Allan, MD

## 2023-02-01 ENCOUNTER — Encounter: Payer: Self-pay | Admitting: Family Medicine

## 2023-02-01 ENCOUNTER — Ambulatory Visit: Payer: 59 | Admitting: Podiatry

## 2023-02-02 ENCOUNTER — Encounter: Payer: Self-pay | Admitting: Family Medicine

## 2023-02-02 NOTE — Assessment & Plan Note (Signed)
Long-term management with Prozac. Recent increase in emotional lability and weepiness. -Consider psychiatry referral for medication evaluation. -Consider increasing therapy sessions. -Consider participation in stress and mindful wellness program.

## 2023-02-02 NOTE — Assessment & Plan Note (Signed)
Decreased levels despite supplementation. Noted improvement in vertigo symptoms with daily supplementation. -Check Vitamin D levels today. -Consider increasing daily Vitamin D supplementation.

## 2023-02-02 NOTE — Assessment & Plan Note (Signed)
-  Consider weight loss options including Delrae Rend MD, [place], [place].  -Encouraged healthy diet and modified activity

## 2023-02-02 NOTE — Assessment & Plan Note (Addendum)
On lisinopril, metoprolol and hydrochlorothiazide for management. -Continue current management.

## 2023-02-02 NOTE — Assessment & Plan Note (Signed)
Improvement noted with daily Vitamin D supplementation. -Continue current management.

## 2023-02-02 NOTE — Assessment & Plan Note (Signed)
On gabapentin and alpha lipoic acid for management. -Continue current management.

## 2023-02-02 NOTE — Assessment & Plan Note (Signed)
The 10-year ASCVD risk score (Arnett DK, et al., 2019) is: 4.8%  -Not interested in statin therapy at this time. -Repeat lipids annually

## 2023-02-02 NOTE — Assessment & Plan Note (Signed)
On doxycycline and Metrogel for management. Noted recurrence of breakouts after discontinuation of doxycycline. -Continue current management.

## 2023-02-02 NOTE — Assessment & Plan Note (Signed)
Managed with Metoprolol and On Eliquis for stroke prevention. -Continue current management. -Follow up with Cardiology as scheduled

## 2023-02-02 NOTE — Assessment & Plan Note (Signed)
-  Consider Pneumonia vaccine. -Mammogram up to date -Continue regular breast self-exams. -Monitor blood pressure. -Declined Colonoscopy and Cologuard -Declined Hepatitis C and HIV screening. -Reviewed annual blood work completed 07/2022

## 2023-02-05 ENCOUNTER — Other Ambulatory Visit: Payer: Self-pay

## 2023-02-13 ENCOUNTER — Other Ambulatory Visit: Payer: Self-pay | Admitting: Cardiology

## 2023-02-13 ENCOUNTER — Other Ambulatory Visit: Payer: Self-pay

## 2023-02-13 DIAGNOSIS — I480 Paroxysmal atrial fibrillation: Secondary | ICD-10-CM

## 2023-02-13 MED ORDER — APIXABAN 5 MG PO TABS
5.0000 mg | ORAL_TABLET | Freq: Two times a day (BID) | ORAL | 1 refills | Status: DC
  Filled 2023-02-13: qty 180, 90d supply, fill #0
  Filled 2023-05-11 – 2023-05-14 (×2): qty 180, 90d supply, fill #1

## 2023-02-13 NOTE — Telephone Encounter (Signed)
Prescription refill request for Eliquis received. Indication: PAF Last office visit: 07/18/22  Percell Belt NP Scr: 0.97 on 07/19/22  Epic Age: 52 Weight: 210kg  Based on above findings Eliquis 5mg  twice daily is the appropriate dose.  Refill approved.

## 2023-02-21 ENCOUNTER — Ambulatory Visit
Admission: RE | Admit: 2023-02-21 | Discharge: 2023-02-21 | Disposition: A | Discharge status: Home / Self Care | Payer: 59 | Source: Ambulatory Visit | Attending: Neurological Surgery

## 2023-02-21 DIAGNOSIS — D496 Neoplasm of unspecified behavior of brain: Secondary | ICD-10-CM

## 2023-02-21 DIAGNOSIS — C719 Malignant neoplasm of brain, unspecified: Secondary | ICD-10-CM | POA: Diagnosis not present

## 2023-02-21 MED ORDER — GADOPICLENOL 0.5 MMOL/ML IV SOLN
10.0000 mL | Freq: Once | INTRAVENOUS | Status: AC | PRN
  Administered 2023-02-21: 10 mL via INTRAVENOUS

## 2023-02-23 ENCOUNTER — Other Ambulatory Visit (HOSPITAL_COMMUNITY): Payer: Self-pay

## 2023-03-01 DIAGNOSIS — M7592 Shoulder lesion, unspecified, left shoulder: Secondary | ICD-10-CM | POA: Diagnosis not present

## 2023-03-19 ENCOUNTER — Other Ambulatory Visit: Payer: Self-pay

## 2023-04-19 ENCOUNTER — Other Ambulatory Visit (HOSPITAL_COMMUNITY): Payer: Self-pay

## 2023-04-20 ENCOUNTER — Other Ambulatory Visit (HOSPITAL_COMMUNITY): Payer: Self-pay

## 2023-04-20 ENCOUNTER — Other Ambulatory Visit: Payer: Self-pay

## 2023-04-24 ENCOUNTER — Other Ambulatory Visit (HOSPITAL_COMMUNITY): Payer: Self-pay

## 2023-04-26 ENCOUNTER — Other Ambulatory Visit (HOSPITAL_COMMUNITY): Payer: Self-pay

## 2023-04-26 MED ORDER — DOXYCYCLINE HYCLATE 100 MG PO CAPS
100.0000 mg | ORAL_CAPSULE | Freq: Every day | ORAL | 3 refills | Status: DC
  Filled 2023-04-26: qty 30, 30d supply, fill #0
  Filled 2023-05-28: qty 30, 30d supply, fill #1
  Filled 2023-07-23: qty 30, 30d supply, fill #2
  Filled 2023-08-20: qty 30, 30d supply, fill #3

## 2023-05-01 ENCOUNTER — Other Ambulatory Visit: Payer: Self-pay | Admitting: Certified Nurse Midwife

## 2023-05-11 ENCOUNTER — Other Ambulatory Visit (HOSPITAL_COMMUNITY): Payer: Self-pay

## 2023-05-11 ENCOUNTER — Other Ambulatory Visit: Payer: Self-pay | Admitting: Certified Nurse Midwife

## 2023-05-11 ENCOUNTER — Telehealth: Payer: Self-pay | Admitting: Cardiology

## 2023-05-11 ENCOUNTER — Encounter (HOSPITAL_COMMUNITY): Payer: Self-pay

## 2023-05-11 ENCOUNTER — Telehealth: Payer: Self-pay | Admitting: Certified Nurse Midwife

## 2023-05-11 MED ORDER — MEDROXYPROGESTERONE ACETATE 2.5 MG PO TABS
2.5000 mg | ORAL_TABLET | Freq: Every day | ORAL | 0 refills | Status: DC
  Filled 2023-05-11: qty 90, 90d supply, fill #0

## 2023-05-11 NOTE — Telephone Encounter (Signed)
Pt c/o medication issue:  1. Name of Medication: apixaban (ELIQUIS) 5 MG TABS tablet   2. How are you currently taking this medication (dosage and times per day)? 5 mg, 2 times daily   3. Are you having a reaction (difficulty breathing--STAT)? No  4. What is your medication issue? Coupon has expired and will cost $ 300 requesting cb to see if we have another coupon

## 2023-05-11 NOTE — Telephone Encounter (Signed)
Hello, Patient

## 2023-05-14 ENCOUNTER — Other Ambulatory Visit (HOSPITAL_COMMUNITY): Payer: Self-pay

## 2023-05-28 ENCOUNTER — Encounter: Payer: Self-pay | Admitting: Family Medicine

## 2023-05-28 ENCOUNTER — Other Ambulatory Visit (HOSPITAL_COMMUNITY): Payer: Self-pay

## 2023-05-29 ENCOUNTER — Telehealth: Admitting: Physician Assistant

## 2023-05-29 DIAGNOSIS — J069 Acute upper respiratory infection, unspecified: Secondary | ICD-10-CM | POA: Diagnosis not present

## 2023-05-29 MED ORDER — ALBUTEROL SULFATE HFA 108 (90 BASE) MCG/ACT IN AERS: 2.0000 | INHALATION_SPRAY | Freq: Four times a day (QID) | RESPIRATORY_TRACT | 0 refills | Status: DC | PRN

## 2023-05-29 MED ORDER — AMOXICILLIN-POT CLAVULANATE 875-125 MG PO TABS: 1.0000 | ORAL_TABLET | Freq: Two times a day (BID) | ORAL | 0 refills | Status: DC

## 2023-05-29 MED ORDER — BENZONATATE 100 MG PO CAPS: 100.0000 mg | ORAL_CAPSULE | Freq: Three times a day (TID) | ORAL | 0 refills | Status: DC | PRN

## 2023-05-29 NOTE — Patient Instructions (Signed)
 Gwyndolyn Kaufman, thank you for joining Piedad Climes, PA-C for today's virtual visit.  While this provider is not your primary care provider (PCP), if your PCP is located in our provider database this encounter information will be shared with them immediately following your visit.   A [facility] MyChart account gives you access to today's visit and all your visits, tests, and labs performed at [facility] " click here if you don't have a [facility] MyChart account or go to [email or web address]  Consent: (Patient) Belinda Garrett provided verbal consent for this virtual visit at the beginning of the encounter.  Current Medications:  Current Outpatient Medications:    Alpha-Lipoic Acid 600 MG TABS, Take 600 mg by mouth daily., Disp: , Rfl:    apixaban (ELIQUIS) 5 MG TABS tablet, Take 1 tablet (5 mg total) by mouth 2 (two) times daily., Disp: 180 tablet, Rfl: 1   doxycycline (VIBRAMYCIN) 100 MG capsule, Take 1 capsule (100 mg total) by mouth daily with food., Disp: 30 capsule, Rfl: 3   FLUoxetine (PROZAC) 20 MG capsule, Take 3 capsules (60 mg total) by mouth daily. (stop 40mg  dose), Disp: 270 capsule, Rfl: 3   fluticasone (FLONASE) 50 MCG/ACT nasal spray, Place 2 sprays into both nostrils daily as needed for allergies or rhinitis., Disp: 16 g, Rfl: 2   gabapentin (NEURONTIN) 400 MG capsule, Take 1 capsule (400 mg total) by mouth at bedtime., Disp: 90 capsule, Rfl: 3   glucosamine-chondroitin 500-400 MG tablet, Take 1 tablet by mouth daily., Disp: , Rfl:    hydrochlorothiazide (HYDRODIURIL) 25 MG tablet, Take 1 tablet (25 mg total) by mouth daily., Disp: 90 tablet, Rfl: 3   lisinopril (ZESTRIL) 30 MG tablet, Take 1 tablet (30 mg total) by mouth daily., Disp: 90 tablet, Rfl: 3   loratadine (CLARITIN) 10 MG tablet, Take 10 mg by mouth daily., Disp: , Rfl:    medroxyPROGESTERone (PROVERA) 2.5 MG tablet, Take 1 tablet (2.5 mg total) by mouth daily., Disp: 90 tablet, Rfl: 0    metFORMIN (GLUCOPHAGE) 500 MG tablet, Take 1 tablet (500 mg total) by mouth daily with breakfast., Disp: 90 tablet, Rfl: 3   metoprolol succinate (TOPROL XL) 25 MG 24 hr tablet, Take 1 tablet (25 mg total) by mouth daily., Disp: 90 tablet, Rfl: 3   metroNIDAZOLE (METROGEL) 0.75 % gel, Apply thin layer on face once or twice daily, Disp: 45 g, Rfl: 6   metroNIDAZOLE (METROGEL) 1 % gel, Apply to face daily, Disp: 60 g, Rfl: 5   Multiple Vitamin (MULTIVITAMIN) capsule, Take 1 capsule by mouth daily., Disp: , Rfl:    Vitamin D, Ergocalciferol, (DRISDOL) 1.25 MG (50000 UNIT) CAPS capsule, Take 1 capsule (50,000 Units total) by mouth every 7 (seven) days., Disp: 12 capsule, Rfl: 1   Medications ordered in this encounter:  No orders of the defined types were placed in this encounter.    *If you need refills on other medications prior to your next appointment, please contact your pharmacy*  Follow-Up: Call back or seek an in-person evaluation if the symptoms worsen or if the condition fails to improve as anticipated.  [facility] [phone]  Other Instructions Increase fluids.  Get plenty of rest. Use Mucinex for congestion. Start the [place] as directed. Take a daily probiotic (I recommend Align or Culturelle, but even Activia Yogurt may be beneficial).  A humidifier placed in the bedroom may offer some relief for a dry, scratchy throat of nasal irritation.  Read information below on acute bronchitis. If not improving on its own over the next 48 hours or any new onset of fever, please start the Augmentin as directed.  Acute Bronchitis Bronchitis is when the airways that extend from the windpipe into the lungs get red, puffy, and painful (inflamed). Bronchitis often causes thick spit (mucus) to develop. This leads to a cough. A cough is the most common symptom of bronchitis. In acute bronchitis, the condition usually begins suddenly and goes away over time (usually in 2 weeks).  Smoking, allergies, and asthma can make bronchitis worse. Repeated episodes of bronchitis may cause more lung problems.  HOME CARE Rest. Drink enough fluids to keep your pee (urine) clear or pale yellow (unless you need to limit fluids as told by your doctor). Only take over-the-counter or prescription medicines as told by your doctor. Avoid smoking and secondhand smoke. These can make bronchitis worse. If you are a smoker, think about using nicotine gum or skin patches. Quitting smoking will help your lungs heal faster. Reduce the chance of getting bronchitis again by: Washing your hands often. Avoiding people with cold symptoms. Trying not to touch your hands to your mouth, nose, or eyes. Follow up with your doctor as told.  GET HELP IF: Your symptoms do not improve after 1 week of treatment. Symptoms include: Cough. Fever. Coughing up thick spit. Body aches. Chest congestion. Chills. Shortness of breath. Sore throat.  GET HELP RIGHT AWAY IF:  You have an increased fever. You have chills. You have severe shortness of breath. You have bloody thick spit (sputum). You throw up (vomit) often. You lose too much body fluid (dehydration). You have a severe headache. You faint.  MAKE SURE YOU:  Understand these instructions. Will watch your condition. Will get help right away if you are not doing well or get worse. Document Released: 08/23/2007 Document Revised: 11/06/2012 Document Reviewed: 08/27/2012 [facility] Patient Information 2015 [place], [place]. This information is not intended to replace advice given to you by your health care provider. Make sure you discuss any questions you have with your health care provider.    If you have been instructed to have an in-person evaluation today at a local Urgent Care facility, please use the link below. It will take you to a list of all of our available [facility], including address, phone number and hours of operation.  Please do not delay care.  [facility]  If you or a family member do not have a primary care provider, use the link below to schedule a visit and establish care. When you choose a [facility] primary care physician or advanced practice provider, you gain a long-term partner in health. Find a Primary Care Provider  Learn more about [facility]'s in-office and virtual care options: [facility] - Get Care Now

## 2023-05-29 NOTE — Progress Notes (Signed)
 Virtual Visit Consent   Belinda Garrett, you are scheduled for a virtual visit with a  provider today. Just as with appointments in the office, your consent must be obtained to participate. Your consent will be active for this visit and any virtual visit you may have with one of our providers in the next 365 days. If you have a MyChart account, a copy of this consent can be sent to you electronically.  As this is a virtual visit, video technology does not allow for your provider to perform a traditional examination. This may limit your provider's ability to fully assess your condition. If your provider identifies any concerns that need to be evaluated in person or the need to arrange testing (such as labs, EKG, etc.), we will make arrangements to do so. Although advances in technology are sophisticated, we cannot ensure that it will always work on either your end or our end. If the connection with a video visit is poor, the visit may have to be switched to a telephone visit. With either a video or telephone visit, we are not always able to ensure that we have a secure connection.  By engaging in this virtual visit, you consent to the provision of healthcare and authorize for your insurance to be billed (if applicable) for the services provided during this visit. Depending on your insurance coverage, you may receive a charge related to this service.  I need to obtain your verbal consent now. Are you willing to proceed with your visit today? Belinda Garrett has provided verbal consent on 05/29/2023 for a virtual visit (video or telephone). Piedad Climes, [place]  Date: 05/29/2023 1:28 PM   Virtual Visit via Video Note   I, Piedad Climes, connected with  Belinda Garrett  ([id number], 1970/06/09) on 05/29/23 at  1:30 PM EDT by a video-enabled telemedicine application and verified that I am speaking with the correct person using two identifiers.  Location: Patient: Virtual Visit  Location Patient: Home Provider: Virtual Visit Location Provider: Home Office   I discussed the limitations of evaluation and management by telemedicine and the availability of in person appointments. The patient expressed understanding and agreed to proceed.    History of Present Illness: Belinda Garrett is a 53 y.o. who identifies as a female who was assigned female at birth, and is being seen today for URI symptoms starting around 4 days ago initially with bad sore throat which improved but then developed substantial nasal congestion, PND and thick sinus drainage. Over past couple of days noting substantial increase in cough with "rattling" in her chest.  Nasal congestion has improved. Denies sinus pain or tooth pain. Mucous has turned light green with coughing.  Denies recent travel or known sick contact. Works from home.   BP -- 132/75 82           130/77 85  HPI: HPI  Problems:  Patient Active Problem List   Diagnosis Date Noted   Abdominal obesity and metabolic syndrome 08/12/2022   Breast cancer screening by mammogram 08/12/2022   Nasal congestion 08/12/2022   Low vitamin D level 08/12/2022   Acute non-recurrent sinusitis 05/16/2022   Paroxysmal atrial fibrillation (HCC) 12/29/2020   Meningioma (HCC) 08/18/2020   Lesion of parotid gland 08/09/2020   Seborrheic dermatitis 06/16/2020   Palpitations 06/16/2020   Other chronic pain 06/16/2020   Annual physical exam 12/16/2019   OSA on CPAP 12/02/2019   Morbid obesity with BMI of 70 and  over, adult [facility]) 10/06/2019   Chronic pain of both knees 07/02/2019   Lymphedema of both lower extremities 06/11/2019   DUB (dysfunctional uterine bleeding) 06/11/2019   Peripheral neuropathy 09/30/2018   Mood disorder (HCC) 07/11/2018   Abnormal CT scan, lumbar spine 07/11/2018   Prediabetes 01/09/2018   Leukocytosis 01/08/2018   Rosacea 01/08/2018   Chronic venous insufficiency of lower extremity 01/08/2018   Hyperlipidemia LDL goal <100  10/05/2017   HTN (hypertension) 09/05/2017   Numbness of toes 09/05/2017   Vertigo 03/19/2017   Endometrial hyperplasia, simple 11/20/2013    Allergies:  Allergies  Allergen Reactions   Dust Mite Extract     Sneezing, itchy, watery eyes   Latex     Tingling sensation   Pollen Extract     Sneezing, itchy, watery eyes   Medications:  Current Outpatient Medications:    albuterol (VENTOLIN HFA) 108 (90 Base) MCG/ACT inhaler, Inhale 2 puffs into the lungs every 6 (six) hours as needed for wheezing or shortness of breath., Disp: 8 g, Rfl: 0   amoxicillin-clavulanate (AUGMENTIN) 875-125 MG tablet, Take 1 tablet by mouth 2 (two) times daily., Disp: 14 tablet, Rfl: 0   benzonatate (TESSALON) 100 MG capsule, Take 1 capsule (100 mg total) by mouth 3 (three) times daily as needed for cough., Disp: 30 capsule, Rfl: 0   Alpha-Lipoic Acid 600 MG TABS, Take 600 mg by mouth daily., Disp: , Rfl:    apixaban (ELIQUIS) 5 MG TABS tablet, Take 1 tablet (5 mg total) by mouth 2 (two) times daily., Disp: 180 tablet, Rfl: 1   doxycycline (VIBRAMYCIN) 100 MG capsule, Take 1 capsule (100 mg total) by mouth daily with food., Disp: 30 capsule, Rfl: 3   FLUoxetine (PROZAC) 20 MG capsule, Take 3 capsules (60 mg total) by mouth daily. (stop 40mg  dose), Disp: 270 capsule, Rfl: 3   fluticasone (FLONASE) 50 MCG/ACT nasal spray, Place 2 sprays into both nostrils daily as needed for allergies or rhinitis., Disp: 16 g, Rfl: 2   gabapentin (NEURONTIN) 400 MG capsule, Take 1 capsule (400 mg total) by mouth at bedtime., Disp: 90 capsule, Rfl: 3   glucosamine-chondroitin 500-400 MG tablet, Take 1 tablet by mouth daily., Disp: , Rfl:    hydrochlorothiazide (HYDRODIURIL) 25 MG tablet, Take 1 tablet (25 mg total) by mouth daily., Disp: 90 tablet, Rfl: 3   lisinopril (ZESTRIL) 30 MG tablet, Take 1 tablet (30 mg total) by mouth daily., Disp: 90 tablet, Rfl: 3   loratadine (CLARITIN) 10 MG tablet, Take 10 mg by mouth daily., Disp: ,  Rfl:    medroxyPROGESTERone (PROVERA) 2.5 MG tablet, Take 1 tablet (2.5 mg total) by mouth daily., Disp: 90 tablet, Rfl: 0   metFORMIN (GLUCOPHAGE) 500 MG tablet, Take 1 tablet (500 mg total) by mouth daily with breakfast., Disp: 90 tablet, Rfl: 3   metoprolol succinate (TOPROL XL) 25 MG 24 hr tablet, Take 1 tablet (25 mg total) by mouth daily., Disp: 90 tablet, Rfl: 3   metroNIDAZOLE (METROGEL) 0.75 % gel, Apply thin layer on face once or twice daily, Disp: 45 g, Rfl: 6   metroNIDAZOLE (METROGEL) 1 % gel, Apply to face daily, Disp: 60 g, Rfl: 5   Multiple Vitamin (MULTIVITAMIN) capsule, Take 1 capsule by mouth daily., Disp: , Rfl:    Vitamin D, Ergocalciferol, (DRISDOL) 1.25 MG (50000 UNIT) CAPS capsule, Take 1 capsule (50,000 Units total) by mouth every 7 (seven) days., Disp: 12 capsule, Rfl: 1  Observations/Objective: Patient is well-developed, well-nourished in  no acute distress.  Resting comfortably at home.  Head is normocephalic, atraumatic.  No labored breathing. Speech is clear and coherent with logical content.  Patient is alert and oriented at baseline.   Assessment and Plan: 1. Acute URI (Primary) - benzonatate (TESSALON) 100 MG capsule; Take 1 capsule (100 mg total) by mouth 3 (three) times daily as needed for cough.  Dispense: 30 capsule; Refill: 0 - albuterol (VENTOLIN HFA) 108 (90 Base) MCG/ACT inhaler; Inhale 2 puffs into the lungs every 6 (six) hours as needed for wheezing or shortness of breath.  Dispense: 8 g; Refill: 0 - amoxicillin-clavulanate (AUGMENTIN) 875-125 MG tablet; Take 1 tablet by mouth 2 (two) times daily.  Dispense: 14 tablet; Refill: 0  Increase fluids.  Rest.  Saline nasal spray.  Probiotic.  Mucinex as directed.  Humidifier in bedroom. Tessalon and albuterol per orders.  If no improvement over next 48 hours or any new onset fever, start Augmentin per orders. Call or return to clinic if symptoms are not improving.   Follow Up Instructions: I discussed  the assessment and treatment plan with the patient. The patient was provided an opportunity to ask questions and all were answered. The patient agreed with the plan and demonstrated an understanding of the instructions.  A copy of instructions were sent to the patient via MyChart unless otherwise noted below.   The patient was advised to call back or seek an in-person evaluation if the symptoms worsen or if the condition fails to improve as anticipated.    Piedad Climes, PA-C

## 2023-05-30 ENCOUNTER — Other Ambulatory Visit (HOSPITAL_COMMUNITY): Payer: Self-pay

## 2023-05-30 ENCOUNTER — Other Ambulatory Visit: Payer: Self-pay | Admitting: Family Medicine

## 2023-05-30 ENCOUNTER — Other Ambulatory Visit: Payer: Self-pay

## 2023-05-30 DIAGNOSIS — G629 Polyneuropathy, unspecified: Secondary | ICD-10-CM

## 2023-05-30 MED ORDER — GABAPENTIN 400 MG PO CAPS
400.0000 mg | ORAL_CAPSULE | Freq: Every day | ORAL | 3 refills | Status: DC
  Filled 2023-05-30: qty 90, 90d supply, fill #0
  Filled 2023-08-20 – 2023-08-24 (×2): qty 90, 90d supply, fill #1

## 2023-06-04 ENCOUNTER — Other Ambulatory Visit: Payer: Self-pay | Admitting: Family Medicine

## 2023-06-04 DIAGNOSIS — J069 Acute upper respiratory infection, unspecified: Secondary | ICD-10-CM

## 2023-06-04 NOTE — Telephone Encounter (Signed)
 Copied from CRM [id number]. Topic: Clinical - Medication Refill >> Jun 04, 2023  9:17 AM Pascal Lux wrote: Most Recent Primary Care Visit:  Provider: Dana Allan  Department: [facility]  Visit Type: PHYSICAL  Date: 01/30/2023  Medication: amoxicillin-clavulanate (AUGMENTIN) 875-125 MG tablet [[id number]]  Has the patient contacted their pharmacy? No (Agent: If no, request that the patient contact the pharmacy for the refill. If patient does not wish to contact the pharmacy document the reason why and proceed with request.) (Agent: If yes, when and what did the pharmacy advise?)  Is this the correct pharmacy for this prescription? Yes If no, delete pharmacy and type the correct one.  This is the patient's preferred pharmacy:    [id number] [place], [place] - [address] [place] [address] Phone: [phone] Fax: [phone]   Has the prescription been filled recently? Yes  Is the patient out of the medication? Yes  Has the patient been seen for an appointment in the last year OR does the patient have an upcoming appointment? Yes  Can we respond through MyChart? Yes  Agent: Please be advised that Rx refills may take up to 3 business days. We ask that you follow-up with your pharmacy.

## 2023-06-18 ENCOUNTER — Other Ambulatory Visit (HOSPITAL_COMMUNITY): Payer: Self-pay

## 2023-06-28 ENCOUNTER — Ambulatory Visit: Payer: 59 | Admitting: Certified Nurse Midwife

## 2023-07-12 ENCOUNTER — Telehealth: Payer: Self-pay

## 2023-07-12 NOTE — Telephone Encounter (Signed)
 pt is on provera  and her period started last Friday 07/06/23 and it is out of control, its heavy and passing clots. She has not had a period in years because of the provera .  She states her period shows no signs of letting up. She has an appointment on 5/13 with [facility]. Pt wants to know if the provera  can be upped or what she can do. Pt aware if Bleeding is so heavy where she is filling a pad completley front to the back then go to the ER. Pt aware Ivin Marrow will be in office tomorrow.

## 2023-07-13 NOTE — Telephone Encounter (Signed)
 Left voice message to advise her to double her dose and call us  if she has any more concerns.

## 2023-07-22 ENCOUNTER — Encounter: Payer: Self-pay | Admitting: Certified Nurse Midwife

## 2023-07-23 ENCOUNTER — Other Ambulatory Visit (HOSPITAL_COMMUNITY): Payer: Self-pay

## 2023-07-23 MED ORDER — MEDROXYPROGESTERONE ACETATE 2.5 MG PO TABS
2.5000 mg | ORAL_TABLET | Freq: Every day | ORAL | 0 refills | Status: DC
  Filled 2023-07-23 – 2023-07-24 (×2): qty 90, 90d supply, fill #0

## 2023-07-24 ENCOUNTER — Other Ambulatory Visit: Payer: Self-pay

## 2023-07-24 ENCOUNTER — Other Ambulatory Visit (HOSPITAL_COMMUNITY): Payer: Self-pay

## 2023-07-26 ENCOUNTER — Telehealth: Payer: Self-pay | Admitting: Family Medicine

## 2023-07-26 NOTE — Telephone Encounter (Signed)
 Copied from CRM [id number]. Topic: Appointments - Transfer of Care >> Jul 26, 2023  1:12 PM Shereese L wrote: Pt is requesting to transfer FROM: Sueanne Emerald Pt is requesting to transfer TO: Calista Catching Reason for requested transfer: Sueanne Emerald is leaving It is the responsibility of the team the patient would like to transfer to (Dr. Eber Goldsmith, Hanley Lew) to reach out to the patient if for any reason this transfer is not acceptable.

## 2023-07-26 NOTE — Telephone Encounter (Signed)
 LVM to call back to schedule TOC for her and her MOM on same day back to back. Put call through to Skyelynn Rambeau to schedule  per Daivd Dub please Thanks

## 2023-07-27 NOTE — Telephone Encounter (Signed)
 Pt is scheduled for 09/06/23

## 2023-07-31 ENCOUNTER — Encounter: Payer: Self-pay | Admitting: Certified Nurse Midwife

## 2023-07-31 ENCOUNTER — Ambulatory Visit: Admitting: Certified Nurse Midwife

## 2023-07-31 ENCOUNTER — Other Ambulatory Visit (HOSPITAL_COMMUNITY): Payer: Self-pay

## 2023-07-31 VITALS — BP 145/79 | HR 86 | Ht 62.0 in | Wt >= 6400 oz

## 2023-07-31 DIAGNOSIS — Z01419 Encounter for gynecological examination (general) (routine) without abnormal findings: Secondary | ICD-10-CM

## 2023-07-31 DIAGNOSIS — Z1231 Encounter for screening mammogram for malignant neoplasm of breast: Secondary | ICD-10-CM

## 2023-07-31 MED ORDER — MEDROXYPROGESTERONE ACETATE 2.5 MG PO TABS
2.5000 mg | ORAL_TABLET | Freq: Every day | ORAL | 3 refills | Status: DC
  Filled 2023-07-31 – 2023-10-12 (×4): qty 90, 90d supply, fill #0

## 2023-07-31 NOTE — Progress Notes (Signed)
 GYNECOLOGY ANNUAL PREVENTATIVE CARE ENCOUNTER NOTE  History:      Belinda Garrett is a 53 y.o. G0P0000 female here for a routine annual gynecologic exam.  Current complaints: starting bleeding heavy recently, called office and was told to take to provera  tablets. She did this and the bleeding Improved she attempted to stop and go to the once daily and bleeding increased again. She has been taking 2 provera  tablets since Friday.Bleeding has improved.   Denies abnormal vaginal bleeding, discharge, pelvic pain, problems with intercourse or other gynecologic concerns.  Pt declines getting undressed today for exam. States that it is very difficult for her to get undresses & dressed. She declines pelvic and breast exam today.   Social Relationship: single Living: alone Work: none  Exercise: none  Smoke/Alcohol/drug use: none   Gynecologic History No LMP recorded. (Menstrual status: Oral contraceptives). Contraception: none Last Pap: 01/14/2020. Results were: normal with negative HPV Last mammogram: 01/15/2023. Results were: normal  Obstetric History OB History  Gravida Para Term Preterm AB Living  0 0 0 0 0 0  SAB IAB Ectopic Multiple Live Births  0 0 0 0 0    Past Medical History:  Diagnosis Date   Abnormal menses    Allergy    Anxiety    Arthritis    Back pain    Back pain    Brain mass 08/09/2020   Chicken pox    Depression    Hypertension    Joint pain    Joint pain    Lymphedema    Palpitations    Peripheral neuropathy 09/30/2018   Pre-diabetes    Prediabetes    Rosacea    Seasonal allergies    Sleep apnea    Stasis dermatitis of both legs    Vertigo     Past Surgical History:  Procedure Laterality Date   APPLICATION OF CRANIAL NAVIGATION N/A 09/28/2020   Procedure: APPLICATION OF CRANIAL NAVIGATION;  Surgeon: Cannon Champion, MD;  Location: [facility] OR;  Service: Neurosurgery;  Laterality: N/A;   CARDIOVERSION N/A 01/28/2021   Procedure:  CARDIOVERSION;  Surgeon: Devorah Fonder, MD;  Location: [facility] ORS;  Service: Cardiovascular;  Laterality: N/A;   CRANIOTOMY Left 09/28/2020   Procedure: Left Occipital craniotomy for tumor resection;  Surgeon: Cannon Champion, MD;  Location[facility] OR;  Service: Neurosurgery;  Laterality: Left;  RM 20   WISDOM TOOTH EXTRACTION      Current Outpatient Medications on File Prior to Visit  Medication Sig Dispense Refill   Alpha-Lipoic Acid 600 MG TABS Take 600 mg by mouth daily.     apixaban  (ELIQUIS ) 5 MG TABS tablet Take 1 tablet (5 mg total) by mouth 2 (two) times daily. 180 tablet 1   doxycycline  (VIBRAMYCIN ) 100 MG capsule Take 1 capsule (100 mg total) by mouth daily with food. 30 capsule 3   FLUoxetine  (PROZAC ) 20 MG capsule Take 3 capsules (60 mg total) by mouth daily. (stop 40mg  dose) 270 capsule 3   fluticasone  (FLONASE ) 50 MCG/ACT nasal spray Place 2 sprays into both nostrils daily as needed for allergies or rhinitis. 16 g 2   gabapentin  (NEURONTIN ) 400 MG capsule Take 1 capsule (400 mg total) by mouth at bedtime. 90 capsule 3   glucosamine-chondroitin 500-400 MG tablet Take 1 tablet by mouth daily.     hydrochlorothiazide  (HYDRODIURIL ) 25 MG tablet Take 1 tablet (25 mg total) by mouth daily. 90 tablet 3   lisinopril  (ZESTRIL )  30 MG tablet Take 1 tablet (30 mg total) by mouth daily. 90 tablet 3   loratadine (CLARITIN) 10 MG tablet Take 10 mg by mouth daily.     medroxyPROGESTERone  (PROVERA ) 2.5 MG tablet Take 1 tablet (2.5 mg total) by mouth daily. 90 tablet 0   metFORMIN  (GLUCOPHAGE ) 500 MG tablet Take 1 tablet (500 mg total) by mouth daily with breakfast. 90 tablet 3   metoprolol  succinate (TOPROL  XL) 25 MG 24 hr tablet Take 1 tablet (25 mg total) by mouth daily. 90 tablet 3   metroNIDAZOLE  (METROGEL ) 0.75 % gel Apply thin layer on face once or twice daily 45 g 6   Multiple Vitamin (MULTIVITAMIN) capsule Take 1 capsule by mouth daily.     Vitamin D , Ergocalciferol , (DRISDOL ) 1.25 MG  (50000 UNIT) CAPS capsule Take 1 capsule (50,000 Units total) by mouth every 7 (seven) days. 12 capsule 1   No current facility-administered medications on file prior to visit.    Allergies  Allergen Reactions   Dust Mite Extract     Sneezing, itchy, watery eyes   Latex     Tingling sensation   Pollen Extract     Sneezing, itchy, watery eyes    Social History:  reports that she has never smoked. She has never used smokeless tobacco. She reports that she does not currently use alcohol. She reports that she does not use drugs.  Family History  Problem Relation Age of Onset   Arthritis Mother    Depression Mother    Hypertension Mother    Thyroid  disease Mother    Obesity Mother    Depression Father    Parkinson's disease Father    Dementia Father    Obesity Father    Arthritis Maternal Grandmother    Heart disease Maternal Grandmother    Hypertension Maternal Grandmother    Hyperlipidemia Maternal Grandmother    Stroke Maternal Grandmother    Alcohol abuse Maternal Grandfather    Arthritis Maternal Grandfather    Depression Maternal Grandfather    Hearing loss Maternal Grandfather    Heart disease Maternal Grandfather    Stroke Maternal Grandfather    Arthritis Paternal Grandmother    Miscarriages / Stillbirths Paternal Grandmother    Arthritis Paternal Grandfather    Depression Paternal Grandfather    Heart disease Paternal Grandfather    Hypertension Paternal Grandfather    Stroke Paternal Grandfather    Diabetes Paternal Aunt    Breast cancer Neg Hx     The following portions of the patient's history were reviewed and updated as appropriate: allergies, current medications, past family history, past medical history, past social history, past surgical history and problem list.  Review of Systems Pertinent items noted in HPI and remainder of comprehensive ROS otherwise negative.  Physical Exam:  BP (!) 145/79   Pulse 86   Ht 5\' 2"  (1.575 m)   Wt (!) 463 lb  14.4 oz (210.4 kg)   BMI 84.85 kg/m  CONSTITUTIONAL: Well-developed, morbidly obese, well-nourished female in no acute distress.  HENT:  Normocephalic, atraumatic, External right and left ear normal. Oropharynx is clear and moist EYES: Conjunctivae and EOM are normal. Pupils are equal, round, and reactive to light. No scleral icterus.  NECK: Normal range of motion, supple, no masses.  Normal thyroid .  SKIN: Skin is warm and dry. No rash noted. Not diaphoretic. No erythema. No pallor. MUSCULOSKELETAL: Normal range of motion. No tenderness.  No cyanosis, clubbing, or edema. NEUROLOGIC: Alert and oriented to person,  place, and time. Normal muscle tone coordination.  PSYCHIATRIC: Normal mood and affect. Normal behavior. Normal judgment and thought content. CARDIOVASCULAR: Normal heart rate noted, regular rhythm RESPIRATORY: Clear to auscultation bilaterally. Effort and breath sounds normal, no problems with respiration noted. BREASTS: declined exam ABDOMEN: Soft, no distention noted.  No tenderness, rebound or guarding.  PELVIC: declined exam. Pap not due.   Assessment and Plan:    1. Well woman exam (Primary)  Pap:not due Mammogram : ordered Labs: none  Refills: provera   Referral: none Consulted MD on abnormal bleeding recommend u/s vs endometrial biopsy for further evaluation. Pt notified of recommendations.  Routine preventative health maintenance measures emphasized. Please refer to After Visit Summary for other counseling recommendations.      Alise Appl, CNM [facility]  [facility],  [facility]

## 2023-08-07 ENCOUNTER — Encounter: Payer: Self-pay | Admitting: Cardiology

## 2023-08-12 ENCOUNTER — Telehealth: Admitting: Emergency Medicine

## 2023-08-12 DIAGNOSIS — J069 Acute upper respiratory infection, unspecified: Secondary | ICD-10-CM

## 2023-08-12 NOTE — Patient Instructions (Signed)
 Belinda Garrett, thank you for joining [name], NP for today's virtual visit.  While this provider is not your primary care provider (PCP), if your PCP is located in our provider database this encounter information will be shared with them immediately following your visit.   A [facility] MyChart account gives you access to today's visit and all your visits, tests, and labs performed at [facility] " click here if you don't have a [facility] MyChart account or go to [email or web address]  Consent: (Patient) Belinda Garrett provided verbal consent for this virtual visit at the beginning of the encounter.  Current Medications:  Current Outpatient Medications:    Alpha-Lipoic Acid 600 MG TABS, Take 600 mg by mouth daily., Disp: , Rfl:    apixaban  (ELIQUIS ) 5 MG TABS tablet, Take 1 tablet (5 mg total) by mouth 2 (two) times daily., Disp: 180 tablet, Rfl: 1   doxycycline  (VIBRAMYCIN ) 100 MG capsule, Take 1 capsule (100 mg total) by mouth daily with food., Disp: 30 capsule, Rfl: 3   FLUoxetine  (PROZAC ) 20 MG capsule, Take 3 capsules (60 mg total) by mouth daily. (stop 40mg  dose), Disp: 270 capsule, Rfl: 3   fluticasone  (FLONASE ) 50 MCG/ACT nasal spray, Place 2 sprays into both nostrils daily as needed for allergies or rhinitis., Disp: 16 g, Rfl: 2   gabapentin  (NEURONTIN ) 400 MG capsule, Take 1 capsule (400 mg total) by mouth at bedtime., Disp: 90 capsule, Rfl: 3   glucosamine-chondroitin 500-400 MG tablet, Take 1 tablet by mouth daily., Disp: , Rfl:    hydrochlorothiazide  (HYDRODIURIL ) 25 MG tablet, Take 1 tablet (25 mg total) by mouth daily., Disp: 90 tablet, Rfl: 3   lisinopril  (ZESTRIL ) 30 MG tablet, Take 1 tablet (30 mg total) by mouth daily., Disp: 90 tablet, Rfl: 3   loratadine (CLARITIN) 10 MG tablet, Take 10 mg by mouth daily., Disp: , Rfl:    medroxyPROGESTERone  (PROVERA ) 2.5 MG tablet, Take 1 tablet (2.5 mg total) by mouth daily., Disp: 90 tablet, Rfl: 3    metFORMIN  (GLUCOPHAGE ) 500 MG tablet, Take 1 tablet (500 mg total) by mouth daily with breakfast., Disp: 90 tablet, Rfl: 3   metoprolol  succinate (TOPROL  XL) 25 MG 24 hr tablet, Take 1 tablet (25 mg total) by mouth daily., Disp: 90 tablet, Rfl: 3   metroNIDAZOLE  (METROGEL ) 0.75 % gel, Apply thin layer on face once or twice daily, Disp: 45 g, Rfl: 6   Multiple Vitamin (MULTIVITAMIN) capsule, Take 1 capsule by mouth daily., Disp: , Rfl:    Vitamin D , Ergocalciferol , (DRISDOL ) 1.25 MG (50000 UNIT) CAPS capsule, Take 1 capsule (50,000 Units total) by mouth every 7 (seven) days., Disp: 12 capsule, Rfl: 1   Medications ordered in this encounter:  No orders of the defined types were placed in this encounter.    *If you need refills on other medications prior to your next appointment, please contact your pharmacy*  Follow-Up: Call back or seek an in-person evaluation if the symptoms worsen or if the condition fails to improve as anticipated.   Virtual Care [phone]  Other Instructions Continue using Mucinex and Flonase .  I would also recommend adding saline nasal spray (at a different time than the Flonase  so you do not wash the medicine out).  If your symptoms continue 10 days or more or if you develop severe severe symptoms including a high fever, I recommend antibiotics then   If you have been instructed to have an in-person evaluation today at a  local Urgent Care facility, please use the link below. It will take you to a list of all of our available [facility], including address, phone number and hours of operation. Please do not delay care.  [facility]  If you or a family member do not have a primary care provider, use the link below to schedule a visit and establish care. When you choose a [facility] primary care physician or advanced practice provider, you gain a long-term partner in health. Find a Primary Care Provider  Learn more about [facility]'s in-office and virtual care options: [place] - Get Care Now

## 2023-08-12 NOTE — Progress Notes (Signed)
 Virtual Visit Consent   Belinda Garrett, you are scheduled for a virtual visit with a [facility] provider today. Just as with appointments in the office, your consent must be obtained to participate. Your consent will be active for this visit and any virtual visit you may have with one of our providers in the next 365 days. If you have a MyChart account, a copy of this consent can be sent to you electronically.  As this is a virtual visit, video technology does not allow for your provider to perform a traditional examination. This may limit your provider's ability to fully assess your condition. If your provider identifies any concerns that need to be evaluated in person or the need to arrange testing (such as labs, EKG, etc.), we will make arrangements to do so. Although advances in technology are sophisticated, we cannot ensure that it will always work on either your end or our end. If the connection with a video visit is poor, the visit may have to be switched to a telephone visit. With either a video or telephone visit, we are not always able to ensure that we have a secure connection.  By engaging in this virtual visit, you consent to the provision of healthcare and authorize for your insurance to be billed (if applicable) for the services provided during this visit. Depending on your insurance coverage, you may receive a charge related to this service.  I need to obtain your verbal consent now. Are you willing to proceed with your visit today? Belinda Garrett D Adamcik has provided verbal consent on 08/12/2023 for a virtual visit (video or telephone). Belinda Burger, NP  Date: 08/12/2023 4:26 PM   Virtual Visit via Video Note   I, Belinda Garrett, connected with  Belinda Garrett  ([id number], 1970/05/01) on 08/12/23 at  4:15 PM EDT by a video-enabled telemedicine application and verified that I am speaking with the correct person using two identifiers.  Location: Patient: Virtual Visit Location Patient:  Home Provider: Virtual Visit Location Provider: Home Office   I discussed the limitations of evaluation and management by telemedicine and the availability of in person appointments. The patient expressed understanding and agreed to proceed.    History of Present Illness: Belinda Garrett is a 53 y.o. who identifies as a female who was assigned female at birth, and is being seen today for URI.  Started 5 days ago with throat irritation.  Also cough, postnasal drainage.  Started Mucinex yesterday -is helping - but still coughing, drainage and now nasal congestion today. Yellowish mucus. Using albuterol  from March-which is not really helping -she is not SOB and used it just in case it would help.  Using flonase  as of yesterday.  No fever.  HPI: HPI  Problems:  Patient Active Problem List   Diagnosis Date Noted   Abdominal obesity and metabolic syndrome 08/12/2022   Breast cancer screening by mammogram 08/12/2022   Nasal congestion 08/12/2022   Low vitamin D  level 08/12/2022   Acute non-recurrent sinusitis 05/16/2022   Paroxysmal atrial fibrillation (HCC) 12/29/2020   Meningioma (HCC) 08/18/2020   Lesion of parotid gland 08/09/2020   Seborrheic dermatitis 06/16/2020   Palpitations 06/16/2020   Other chronic pain 06/16/2020   Annual physical exam 12/16/2019   OSA on CPAP 12/02/2019   Morbid obesity with BMI of 70 and over, adult (HCC) 10/06/2019   Chronic pain of both knees 07/02/2019   Lymphedema of both lower extremities 06/11/2019   DUB (dysfunctional uterine  bleeding) 06/11/2019   Peripheral neuropathy 09/30/2018   Mood disorder (HCC) 07/11/2018   Abnormal CT scan, lumbar spine 07/11/2018   Prediabetes 01/09/2018   Leukocytosis 01/08/2018   Rosacea 01/08/2018   Chronic venous insufficiency of lower extremity 01/08/2018   Hyperlipidemia LDL goal <100 10/05/2017   HTN (hypertension) 09/05/2017   Numbness of toes 09/05/2017   Vertigo 03/19/2017   Endometrial hyperplasia, simple  11/20/2013    Allergies:  Allergies  Allergen Reactions   Dust Mite Extract     Sneezing, itchy, watery eyes   Latex     Tingling sensation   Pollen Extract     Sneezing, itchy, watery eyes   Medications:  Current Outpatient Medications:    Alpha-Lipoic Acid 600 MG TABS, Take 600 mg by mouth daily., Disp: , Rfl:    apixaban  (ELIQUIS ) 5 MG TABS tablet, Take 1 tablet (5 mg total) by mouth 2 (two) times daily., Disp: 180 tablet, Rfl: 1   doxycycline  (VIBRAMYCIN ) 100 MG capsule, Take 1 capsule (100 mg total) by mouth daily with food., Disp: 30 capsule, Rfl: 3   FLUoxetine  (PROZAC ) 20 MG capsule, Take 3 capsules (60 mg total) by mouth daily. (stop 40mg  dose), Disp: 270 capsule, Rfl: 3   fluticasone  (FLONASE ) 50 MCG/ACT nasal spray, Place 2 sprays into both nostrils daily as needed for allergies or rhinitis., Disp: 16 g, Rfl: 2   gabapentin  (NEURONTIN ) 400 MG capsule, Take 1 capsule (400 mg total) by mouth at bedtime., Disp: 90 capsule, Rfl: 3   glucosamine-chondroitin 500-400 MG tablet, Take 1 tablet by mouth daily., Disp: , Rfl:    hydrochlorothiazide  (HYDRODIURIL ) 25 MG tablet, Take 1 tablet (25 mg total) by mouth daily., Disp: 90 tablet, Rfl: 3   lisinopril  (ZESTRIL ) 30 MG tablet, Take 1 tablet (30 mg total) by mouth daily., Disp: 90 tablet, Rfl: 3   loratadine (CLARITIN) 10 MG tablet, Take 10 mg by mouth daily., Disp: , Rfl:    medroxyPROGESTERone  (PROVERA ) 2.5 MG tablet, Take 1 tablet (2.5 mg total) by mouth daily., Disp: 90 tablet, Rfl: 3   metFORMIN  (GLUCOPHAGE ) 500 MG tablet, Take 1 tablet (500 mg total) by mouth daily with breakfast., Disp: 90 tablet, Rfl: 3   metoprolol  succinate (TOPROL  XL) 25 MG 24 hr tablet, Take 1 tablet (25 mg total) by mouth daily., Disp: 90 tablet, Rfl: 3   metroNIDAZOLE  (METROGEL ) 0.75 % gel, Apply thin layer on face once or twice daily, Disp: 45 g, Rfl: 6   Multiple Vitamin (MULTIVITAMIN) capsule, Take 1 capsule by mouth daily., Disp: , Rfl:    Vitamin D ,  Ergocalciferol , (DRISDOL ) 1.25 MG (50000 UNIT) CAPS capsule, Take 1 capsule (50,000 Units total) by mouth every 7 (seven) days., Disp: 12 capsule, Rfl: 1  Observations/Objective: Patient is well-developed, well-nourished in no acute distress.  Resting comfortably  at home.  Head is normocephalic, atraumatic.  No labored breathing.  Speech is clear and coherent with logical content.  Patient is alert and oriented at baseline.  Does sound congested on video  Assessment and Plan: 1. Upper respiratory tract infection, unspecified type (Primary)  No need for antibiotics at this point in time.  This is most likely viral.  If symptoms persist around 10 days or more, or if she develops a high fever, I recommend antibiotics and no circumstances.  Follow Up Instructions: I discussed the assessment and treatment plan with the patient. The patient was provided an opportunity to ask questions and all were answered. The patient agreed with the  plan and demonstrated an understanding of the instructions.  A copy of instructions were sent to the patient via MyChart unless otherwise noted below.   The patient was advised to call back or seek an in-person evaluation if the symptoms worsen or if the condition fails to improve as anticipated.    Belinda Burger, NP

## 2023-08-17 ENCOUNTER — Ambulatory Visit: Admitting: Cardiology

## 2023-08-20 ENCOUNTER — Other Ambulatory Visit (HOSPITAL_COMMUNITY): Payer: Self-pay

## 2023-08-20 ENCOUNTER — Encounter: Payer: Self-pay | Admitting: Emergency Medicine

## 2023-08-20 ENCOUNTER — Other Ambulatory Visit: Payer: Self-pay

## 2023-08-21 NOTE — Progress Notes (Unsigned)
 Electrophysiology Clinic Note    Date:  08/22/2023  Patient ID:  Belinda Garrett, Belinda Garrett 1971-03-19, MRN [id number] PCP:  Valli Gaw, MD  Cardiologist:  None Electrophysiologist: Boyce Byes, MD   Discussed the use of AI scribe software for clinical note transcription with the patient, who gave verbal consent to proceed.   Patient Profile    Chief Complaint: 1 yr AF follow-up  History of Present Illness: Belinda Garrett is a 53 y.o. female with PMH notable for persis AFib, SVT, HTN, OSA; seen today for Boyce Byes, MD for routine electrophysiology followup.  AF initially diagnosed in perioperative period after meningioma resection 09/2020.  I last saw her 06/2022 where she was doing well from a cardiac perspective. No afib or palpitation episodes. BP elevated at visit, but unclear if elevated if at home.   On follow-up today, she is overall doing very well from a cardiac perspective.  She has not had any palpitation episodes for many weeks, recently has had a slight uptick in palpitations.  She has had an upper respiratory infection and has been taking Mucinex-DM regularly.  She notes her heart rate rises with walking around the house, often accompanied by shortness of breath.  She admits to being inactive, and thinks that if she were more active the symptoms would improve..  She denies chest pain, chest pressure.  She continues to take Eliquis  twice daily without significant bleeding.  She does note that she is currently going through menopause and has vaginal bleeding for weeks on end at times, and seeing GYN regularly for this.  She has appoint with PCP in about 2 weeks   Arrhythmia/Device History No specialty comments available.   ROS:  Please see the history of present illness. All other systems are reviewed and otherwise negative.    Physical Exam    VS:  BP 128/68 (BP Location: Left Wrist, Patient Position: Sitting, Cuff Size: Large)   Pulse 82   Ht 5\' 2"   (1.575 m)   Wt (!) 464 lb 6.4 oz (210.7 kg)   SpO2 94%   BMI 84.94 kg/m  BMI: Body mass index is 84.94 kg/m.  Wt Readings from Last 3 Encounters:  08/22/23 (!) 464 lb 6.4 oz (210.7 kg)  07/31/23 (!) 463 lb 14.4 oz (210.4 kg)  01/30/23 (!) 456 lb (206.8 kg)     GEN- The patient is well appearing, alert and oriented x 3 today.   Lungs- diminished throughout, normal work of breathing.  Heart- Regular rate and rhythm, no murmurs, rubs or gallops Extremities- 1+ peripheral edema, warm, dry    Studies Reviewed   Previous EP, cardiology notes.    EKG is ordered. Personal review of EKG from today shows:    EKG Interpretation Date/Time:  Wednesday August 22 2023 14:05:18 EDT Ventricular Rate:  82 PR Interval:  154 QRS Duration:  80 QT Interval:  370 QTC Calculation: 432 R Axis:   -24  Text Interpretation: Normal sinus rhythm Low voltage QRS Confirmed by Raquel Sayres [phone]) on 08/22/2023 2:08:01 PM     Long term monitor, 06/15/2021 HR 54 - 203 bpm, average 82 bpm. 36 SVT, longest 15 beats at an average rate of 120 bpm Symptom triggered episodes correspond to SVT. Rare supraventricular and ventricular ectopy. No sustained arrhythmias.      Assessment and Plan     #) persis Afib #) SVT Burden was well-controlled on 25 mg Toprol  daily prior to current upper respiratory tract  infection.  We discussed that increased burden was likely due to URI and decongestant use. Will continue to monitor at this time If continues to have palpitations after resolution of URI, consider initiating AAD   #) Hypercoag d/t persis afib CHA2DS2-VASc Score = at least 2 [CHF History: 0, HTN History: 1, Diabetes History: 0, Stroke History: 0, Vascular Disease History: 0, Age Score: 0, Gender Score: 1].  Therefore, the patient's annual risk of stroke is 2.2 %.    Stroke ppx - 5mg  eliquis  BID, appropriately dosed No bleeding concerns Recommend updating CBC, BMP, mag.  Patient has upcoming  appointment with PCP in about 2 weeks.  Will follow lab results   #) HTN Well-controlled in office today       Current medicines are reviewed at length with the patient today.   The patient does not have concerns regarding her medicines.  The following changes were made today:  none  Labs/ tests ordered today include:  Orders Placed This Encounter  Procedures   EKG 12-Lead     Disposition: Follow up with Dr. Marven Slimmer or EP APP in 6 months. If doing well, ok to extend follow-up to 12 months.    Signed, Matika Bartell, NP  08/22/23  4:16 PM  Electrophysiology [facility]

## 2023-08-22 ENCOUNTER — Ambulatory Visit: Attending: Cardiology | Admitting: Cardiology

## 2023-08-22 ENCOUNTER — Other Ambulatory Visit (HOSPITAL_COMMUNITY): Payer: Self-pay

## 2023-08-22 ENCOUNTER — Other Ambulatory Visit: Payer: Self-pay

## 2023-08-22 VITALS — BP 128/68 | HR 82 | Ht 62.0 in | Wt >= 6400 oz

## 2023-08-22 DIAGNOSIS — I480 Paroxysmal atrial fibrillation: Secondary | ICD-10-CM | POA: Diagnosis not present

## 2023-08-22 DIAGNOSIS — I10 Essential (primary) hypertension: Secondary | ICD-10-CM

## 2023-08-22 DIAGNOSIS — G4733 Obstructive sleep apnea (adult) (pediatric): Secondary | ICD-10-CM | POA: Diagnosis not present

## 2023-08-22 DIAGNOSIS — D6869 Other thrombophilia: Secondary | ICD-10-CM | POA: Diagnosis not present

## 2023-08-22 DIAGNOSIS — I471 Supraventricular tachycardia, unspecified: Secondary | ICD-10-CM | POA: Diagnosis not present

## 2023-08-22 MED ORDER — APIXABAN 5 MG PO TABS
5.0000 mg | ORAL_TABLET | Freq: Two times a day (BID) | ORAL | 1 refills | Status: DC
  Filled 2023-08-22: qty 180, 90d supply, fill #0
  Filled 2023-11-14: qty 180, 90d supply, fill #1

## 2023-08-22 NOTE — Patient Instructions (Signed)
 Medication Instructions:  The current medical regimen is effective;  continue present plan and medications as directed. Please refer to the Current Medication list given to you today.   *If you need a refill on your cardiac medications before your next appointment, please call your pharmacy*   Follow-Up: At [facility], you and your health needs are our priority.  As part of our continuing mission to provide you with exceptional heart care, our providers are all part of one team.  This team includes your primary Cardiologist (physician) and Advanced Practice Providers or APPs (Physician Assistants and Nurse Practitioners) who all work together to provide you with the care you need, when you need it.  Your next appointment:   6 month(s)  Provider:   You may see Boyce Byes, MD or one of the following Advanced Practice Providers on your designated Care Team:   Suzann Riddle, NP   We recommend signing up for the patient portal called "MyChart".  Sign up information is provided on this After Visit Summary.  MyChart is used to connect with patients for Virtual Visits (Telemedicine).  Patients are able to view lab/test results, encounter notes, upcoming appointments, etc.  Non-urgent messages can be sent to your provider as well.   To learn more about what you can do with MyChart, go to [email or web address].

## 2023-09-06 ENCOUNTER — Encounter: Payer: Self-pay | Admitting: Family

## 2023-09-06 ENCOUNTER — Ambulatory Visit: Admitting: Family

## 2023-09-06 VITALS — BP 120/70 | HR 91 | Temp 97.9°F | Ht 61.0 in | Wt >= 6400 oz

## 2023-09-06 DIAGNOSIS — I10 Essential (primary) hypertension: Secondary | ICD-10-CM | POA: Diagnosis not present

## 2023-09-06 DIAGNOSIS — Z13220 Encounter for screening for lipoid disorders: Secondary | ICD-10-CM | POA: Diagnosis not present

## 2023-09-06 DIAGNOSIS — Z136 Encounter for screening for cardiovascular disorders: Secondary | ICD-10-CM

## 2023-09-06 DIAGNOSIS — D329 Benign neoplasm of meninges, unspecified: Secondary | ICD-10-CM | POA: Diagnosis not present

## 2023-09-06 DIAGNOSIS — F39 Unspecified mood [affective] disorder: Secondary | ICD-10-CM | POA: Diagnosis not present

## 2023-09-06 DIAGNOSIS — R600 Localized edema: Secondary | ICD-10-CM | POA: Diagnosis not present

## 2023-09-06 DIAGNOSIS — G629 Polyneuropathy, unspecified: Secondary | ICD-10-CM

## 2023-09-06 DIAGNOSIS — Z8639 Personal history of other endocrine, nutritional and metabolic disease: Secondary | ICD-10-CM | POA: Diagnosis not present

## 2023-09-06 MED ORDER — GABAPENTIN 400 MG PO CAPS
400.0000 mg | ORAL_CAPSULE | Freq: Two times a day (BID) | ORAL | Status: DC
Start: 2023-09-06 — End: 2023-10-23

## 2023-09-06 NOTE — Progress Notes (Unsigned)
 Assessment & Plan:  Peripheral polyneuropathy -     Basic metabolic panel with GFR -     Magnesium -     CBC with Differential/Platelet -     Iron, TIBC and Ferritin Panel -     B12 and Folate Panel -     Gabapentin ; Take 1 capsule (400 mg total) by mouth 2 (two) times daily. -     US  ARTERIAL ABI (SCREENING LOWER EXTREMITY); Future -     Ambulatory referral to Vascular Surgery     Return precautions given.   Risks, benefits, and alternatives of the medications and treatment plan prescribed today were discussed, and patient expressed understanding.   Education regarding symptom management and diagnosis given to patient on AVS either electronically or printed.  No follow-ups on file.  Bascom Bossier, FNP  Subjective:    Patient ID: Belinda Garrett, female    DOB: 1970/06/29, 53 y.o.   MRN: [id number]  CC: Belinda Garrett is a 53 y.o. female who presents today to establish care.    HPI: HPI Compliant with prozac  60mg  Following with cardiology, last seen 08/22/23 PAF; compliant with Toprol  25mg  every day, eliquis  5mg  BID Following with GYN, Alise Appl, CMN  Mammogram UTD  She peripheral neuropathy in BL toes.  Her second finger tip in left hand.  She notes RLS. She is taking gabapentin  400mg  every day.   Denies intermittent claudication  H/o meningioma   CT head and neck 08/03/2020  7mm ovoid nodule with the right parotid gand  She had consult with [facility] for right parotid gland ; no biopsy as she was unable to palpate lesion since.   No trouble swallowing, pain with swallowing   MRI Brain 08/18/20  5.9 cm left parieto-occipital meningioma   S/p Resection of the large left posterior meningioma  09/28/2020, Dr Remi Carina ; she follows biannually  MRI brain 02/21/2023 Previous left posterior parietal craniotomy for tumor resection. No evidence of residual or recurrent meningioma tissue.  Allergies: Dust mite extract, Latex, and Pollen  extract Current Outpatient Medications on File Prior to Visit  Medication Sig Dispense Refill   Alpha-Lipoic Acid 600 MG TABS Take 600 mg by mouth daily.     apixaban  (ELIQUIS ) 5 MG TABS tablet Take 1 tablet (5 mg total) by mouth 2 (two) times daily. 180 tablet 1   doxycycline  (VIBRAMYCIN ) 100 MG capsule Take 1 capsule (100 mg total) by mouth daily with food. 30 capsule 3   FLUoxetine  (PROZAC ) 20 MG capsule Take 3 capsules (60 mg total) by mouth daily. (stop 40mg  dose) 270 capsule 3   fluticasone  (FLONASE ) 50 MCG/ACT nasal spray Place 2 sprays into both nostrils daily as needed for allergies or rhinitis. 16 g 2   glucosamine-chondroitin 500-400 MG tablet Take 1 tablet by mouth daily.     hydrochlorothiazide  (HYDRODIURIL ) 25 MG tablet Take 1 tablet (25 mg total) by mouth daily. 90 tablet 3   lisinopril  (ZESTRIL ) 30 MG tablet Take 1 tablet (30 mg total) by mouth daily. 90 tablet 3   loratadine (CLARITIN) 10 MG tablet Take 10 mg by mouth daily.     medroxyPROGESTERone  (PROVERA ) 2.5 MG tablet Take 1 tablet (2.5 mg total) by mouth daily. 90 tablet 3   metFORMIN  (GLUCOPHAGE ) 500 MG tablet Take 1 tablet (500 mg total) by mouth daily with breakfast. 90 tablet 3   metoprolol  succinate (TOPROL  XL) 25 MG 24 hr tablet Take 1 tablet (25 mg total) by mouth daily.  90 tablet 3   metroNIDAZOLE  (METROGEL ) 0.75 % gel Apply thin layer on face once or twice daily 45 g 6   Multiple Vitamin (MULTIVITAMIN) capsule Take 1 capsule by mouth daily.     No current facility-administered medications on file prior to visit.    Review of Systems    Objective:    BP (!) 140/86   Pulse 91   Temp 97.9 F (36.6 C) (Oral)   Ht 5' 1 (1.549 m)   Wt (!) 463 lb 6.4 oz (210.2 kg)   LMP  (LMP Unknown)   SpO2 97%   BMI 87.56 kg/m  BP Readings from Last 3 Encounters:  09/06/23 (!) 140/86  08/22/23 128/68  07/31/23 (!) 145/79   Wt Readings from Last 3 Encounters:  09/06/23 (!) 463 lb 6.4 oz (210.2 kg)  08/22/23 (!) 464  lb 6.4 oz (210.7 kg)  07/31/23 (!) 463 lb 14.4 oz (210.4 kg)    Physical Exam

## 2023-09-06 NOTE — Patient Instructions (Addendum)
 Increase gabapentin  to 400mg  twice daily. Monitor for sedation  Referral to vascular and for ultrasound of legs  Let us  know if you dont hear back within a week in regards to an appointment being scheduled.   So that you are aware, if you are [facility] MyChart user , please pay attention to your MyChart messages as you may receive a MyChart message with a phone number to call and schedule this test/appointment own your own from our referral coordinator. This is a new process so I do not want you to miss this message.  If you are not a MyChart user, you will receive a phone call.

## 2023-09-07 LAB — LIPID PANEL
Cholesterol: 185 mg/dL (ref 0–200)
HDL: 45.1 mg/dL (ref >39.00)
LDL Cholesterol: 120 mg/dL — ABNORMAL HIGH (ref 0–99)
NonHDL: 139.51
Total CHOL/HDL Ratio: 4
Triglycerides: 99 mg/dL (ref 0.0–149.0)
VLDL: 19.8 mg/dL (ref 0.0–40.0)

## 2023-09-07 LAB — CBC WITH DIFFERENTIAL/PLATELET
Basophils Absolute: 0.1 10*3/uL (ref 0.0–0.1)
Basophils Relative: 0.6 % (ref 0.0–3.0)
Eosinophils Absolute: 0.2 10*3/uL (ref 0.0–0.7)
Eosinophils Relative: 1.5 % (ref 0.0–5.0)
HCT: 36 % (ref 36.0–46.0)
Hemoglobin: 11.1 g/dL — ABNORMAL LOW (ref 12.0–15.0)
Lymphocytes Relative: 16 % (ref 12.0–46.0)
Lymphs Abs: 2 10*3/uL (ref 0.7–4.0)
MCHC: 30.9 g/dL (ref 30.0–36.0)
MCV: 80.1 fl (ref 78.0–100.0)
Monocytes Absolute: 0.6 10*3/uL (ref 0.1–1.0)
Monocytes Relative: 5.1 % (ref 3.0–12.0)
Neutro Abs: 9.6 10*3/uL — ABNORMAL HIGH (ref 1.4–7.7)
Neutrophils Relative %: 76.8 % (ref 43.0–77.0)
Platelets: 296 10*3/uL (ref 150.0–400.0)
RBC: 4.5 Mil/uL (ref 3.87–5.11)
RDW: 16.8 % — ABNORMAL HIGH (ref 11.5–15.5)
WBC: 12.6 10*3/uL — ABNORMAL HIGH (ref 4.0–10.5)

## 2023-09-07 LAB — BASIC METABOLIC PANEL WITH GFR
BUN: 18 mg/dL (ref 6–23)
CO2: 23 meq/L (ref 19–32)
Calcium: 9.3 mg/dL (ref 8.4–10.5)
Chloride: 101 meq/L (ref 96–112)
Creatinine, Ser: 0.9 mg/dL (ref 0.40–1.20)
GFR: 72.47 mL/min (ref >60.00)
Glucose, Bld: 116 mg/dL — ABNORMAL HIGH (ref 70–99)
Potassium: 4.5 meq/L (ref 3.5–5.1)
Sodium: 139 meq/L (ref 135–145)

## 2023-09-07 LAB — VITAMIN D 25 HYDROXY (VIT D DEFICIENCY, FRACTURES): VITD: 44.53 ng/mL (ref 30.00–100.00)

## 2023-09-07 LAB — IRON,TIBC AND FERRITIN PANEL
%SAT: 6 % — ABNORMAL LOW (ref 16–45)
Ferritin: 15 ng/mL — ABNORMAL LOW (ref 16–232)
Iron: 24 ug/dL — ABNORMAL LOW (ref 45–160)
TIBC: 413 ug/dL (ref 250–450)

## 2023-09-07 LAB — B12 AND FOLATE PANEL
Folate: 23 ng/mL (ref >5.9)
Vitamin B-12: 1080 pg/mL — ABNORMAL HIGH (ref 211–911)

## 2023-09-07 LAB — MAGNESIUM: Magnesium: 2.2 mg/dL (ref 1.5–2.5)

## 2023-09-07 LAB — TSH: TSH: 1.24 u[IU]/mL (ref 0.35–5.50)

## 2023-09-10 NOTE — Assessment & Plan Note (Signed)
 Chronic, stable. Continue lisinopril  30mg  every day, hydrochlorothiazide  25mg  every day.

## 2023-09-10 NOTE — Assessment & Plan Note (Signed)
 Chronic, suboptimal control. Increase gabapentin  to 400mg  BID. Counseled on sedation. Pending labs.

## 2023-09-10 NOTE — Assessment & Plan Note (Signed)
 Chronic, stable. Continue prozac  60mg  every day.

## 2023-09-10 NOTE — Assessment & Plan Note (Signed)
 Reviewed prior work up , surgical history. MRI brain 2024 without recurrence. Repeat in 1-2 years per Dr Cheryle, neurosurgery.  Will follow clinically.

## 2023-09-10 NOTE — Assessment & Plan Note (Signed)
 Concern for PAD with overlapping features of venous insufficiency. Consider lymphedema as well. Pending vascular consult. Advised zip up compression stockings.

## 2023-09-11 ENCOUNTER — Encounter: Payer: Self-pay | Admitting: Family

## 2023-09-11 ENCOUNTER — Ambulatory Visit: Payer: Self-pay | Admitting: Family

## 2023-09-11 ENCOUNTER — Other Ambulatory Visit: Payer: Self-pay

## 2023-09-11 DIAGNOSIS — R899 Unspecified abnormal finding in specimens from other organs, systems and tissues: Secondary | ICD-10-CM

## 2023-09-12 ENCOUNTER — Ambulatory Visit
Admission: RE | Admit: 2023-09-12 | Discharge: 2023-09-12 | Disposition: A | Discharge status: Home / Self Care | Source: Ambulatory Visit | Attending: Family | Admitting: Family

## 2023-09-12 DIAGNOSIS — G629 Polyneuropathy, unspecified: Secondary | ICD-10-CM | POA: Diagnosis not present

## 2023-09-12 DIAGNOSIS — I739 Peripheral vascular disease, unspecified: Secondary | ICD-10-CM | POA: Diagnosis not present

## 2023-09-17 ENCOUNTER — Other Ambulatory Visit: Payer: Self-pay | Admitting: Family

## 2023-09-17 DIAGNOSIS — D649 Anemia, unspecified: Secondary | ICD-10-CM

## 2023-09-19 ENCOUNTER — Encounter (INDEPENDENT_AMBULATORY_CARE_PROVIDER_SITE_OTHER): Payer: Self-pay | Admitting: Vascular Surgery

## 2023-09-19 ENCOUNTER — Ambulatory Visit (INDEPENDENT_AMBULATORY_CARE_PROVIDER_SITE_OTHER): Admitting: Vascular Surgery

## 2023-09-19 VITALS — BP 124/60 | HR 108 | Ht 61.0 in | Wt >= 6400 oz

## 2023-09-19 DIAGNOSIS — I890 Lymphedema, not elsewhere classified: Secondary | ICD-10-CM | POA: Diagnosis not present

## 2023-09-19 DIAGNOSIS — I10 Essential (primary) hypertension: Secondary | ICD-10-CM

## 2023-09-19 DIAGNOSIS — I480 Paroxysmal atrial fibrillation: Secondary | ICD-10-CM | POA: Diagnosis not present

## 2023-09-19 DIAGNOSIS — Z6841 Body Mass Index (BMI) 40.0 and over, adult: Secondary | ICD-10-CM

## 2023-09-19 NOTE — Progress Notes (Signed)
 Subjective:    Patient ID: Belinda Garrett, female    DOB: 24-Mar-1970, 52 y.o.   MRN: [id number] Chief Complaint  Patient presents with   np. consult. Venous insufficiency,lymphedema     Belinda Garrett is a 53 year old female who presents to clinic today with a chief complaint of numbness and tingling to her upper extremities as well as her lower extremities.  The patient is morbidly obese with a BMI of 86.92 and she addresses this with me first as noting she has been heavy all of her life and continues to work on her weight daily.  She endorses she is not on any weight control medication at this time.  However her chief concern is the numbness and tingling to her hips and bilateral lower extremities as well as some numbness and tingling to her upper extremities in her fingertips.  Patient endorses she underwent bilateral lower extremity arterial ultrasounds about a month ago and is inquiring about those results.    Review of Systems  Constitutional: Negative.   Cardiovascular:  Positive for leg swelling.       Patient has a history of lymphedema.  Patient currently using compression socks and doing conventional therapy.  Neurological:  Positive for numbness.       Patient endorses increased numbness and tingling to bilateral upper extremities as well as lower extremities over the past couple of months.  All other systems reviewed and are negative.      Objective:   Physical Exam Constitutional:      Appearance: Normal appearance. She is obese.  HENT:     Head: Normocephalic.  Eyes:     Pupils: Pupils are equal, round, and reactive to light.  Cardiovascular:     Rate and Rhythm: Normal rate and regular rhythm.     Pulses: Normal pulses.     Heart sounds: Normal heart sounds.  Pulmonary:     Breath sounds: Normal breath sounds.     Comments: Patient noted to have respiratory difficulties upon ambulation due to her weight. Abdominal:     General: Bowel sounds are normal.      Palpations: Abdomen is soft.  Musculoskeletal:     Right lower leg: Edema present.     Left lower leg: Edema present.  Skin:    General: Skin is warm and dry.     Capillary Refill: Capillary refill takes 2 to 3 seconds.  Neurological:     General: No focal deficit present.     Mental Status: She is alert and oriented to person, place, and time. Mental status is at baseline.  Psychiatric:        Mood and Affect: Mood normal.        Behavior: Behavior normal.        Thought Content: Thought content normal.        Judgment: Judgment normal.     BP 124/60   Pulse (!) 108   Ht 5' 1 (1.549 m)   Wt (!) 460 lb (208.7 kg)   LMP  (LMP Unknown)   BMI 86.92 kg/m   Past Medical History:  Diagnosis Date   Abnormal menses    Allergy    Anxiety    Arthritis    Back pain    Back pain    Brain mass 08/09/2020   Chicken pox    Depression    Hypertension    Joint pain    Joint pain    Lymphedema    Palpitations  Peripheral neuropathy 09/30/2018   Pre-diabetes    Prediabetes    Rosacea    Seasonal allergies    Sleep apnea    Stasis dermatitis of both legs    Vertigo     Social History   Socioeconomic History   Marital status: Single    Spouse name: Not on file   Number of children: Not on file   Years of education: Not on file   Highest education level: Some college, no degree  Occupational History   Occupation: work from home, IT consultant - [employer]  Tobacco Use   Smoking status: Never   Smokeless tobacco: Never  Vaping Use   Vaping status: Never Used  Substance and Sexual Activity   Alcohol use: Not Currently   Drug use: Never   Sexual activity: Never  Other Topics Concern   Not on file  Social History Narrative   Works for [employer] from home , 25 years.    No kids    No guns    Wears seat belt    Safe in relationship    Social Drivers of Health   Financial Resource Strain: Low Risk  (09/03/2023)   Overall Financial Resource Strain (CARDIA)     Difficulty of Paying Living Expenses: Not hard at all  Food Insecurity: No Food Insecurity (09/03/2023)   Hunger Vital Sign    Worried About Running Out of Food in the Last Year: Never true    Ran Out of Food in the Last Year: Never true  Transportation Needs: No Transportation Needs (09/03/2023)   PRAPARE - Administrator, Civil Service (Medical): No    Lack of Transportation (Non-Medical): No  Physical Activity: Inactive (09/03/2023)   Exercise Vital Sign    Days of Exercise per Week: 0 days    Minutes of Exercise per Session: Not on file  Stress: No Stress Concern Present (09/03/2023)   [employer] of Occupational Health - Occupational Stress Questionnaire    Feeling of Stress: Only a little  Recent Concern: Stress - Stress Concern Present (06/11/2023)   Received from [facility]   [employer] of Occupational Health - Occupational Stress Questionnaire    Feeling of Stress : Rather much  Social Connections: Moderately Integrated (09/03/2023)   Social Connection and Isolation Panel    Frequency of Communication with Friends and Family: More than three times a week    Frequency of Social Gatherings with Friends and Family: Twice a week    Attends Religious Services: 1 to 4 times per year    Active Member of Clubs or Organizations: Yes    Attends Engineer, structural: More than 4 times per year    Marital Status: Never married  Intimate Partner Violence: Not on file    Past Surgical History:  Procedure Laterality Date   APPLICATION OF CRANIAL NAVIGATION N/A 09/28/2020   Procedure: APPLICATION OF CRANIAL NAVIGATION;  Surgeon: Cheryle Debby LABOR, MD;  Location: [facility] OR;  Service: Neurosurgery;  Laterality: N/A;   CARDIOVERSION N/A 01/28/2021   Procedure: CARDIOVERSION;  Surgeon: Perla Evalene PARAS, MD;  Location: [facility] ORS;  Service: Cardiovascular;  Laterality: N/A;   CRANIOTOMY Left 09/28/2020   Procedure: Left Occipital craniotomy for  tumor resection;  Surgeon: Cheryle Debby LABOR, MD;  Location: [facility] OR;  Service: Neurosurgery;  Laterality: Left;  RM 20   WISDOM TOOTH EXTRACTION      Family History  Problem Relation Age of Onset   Arthritis  Mother    Depression Mother    Hypertension Mother    Thyroid  disease Mother    Obesity Mother    Depression Father    Parkinson's disease Father 79       died at 93   Dementia Father 80   Obesity Father    Healthy Brother    Arthritis Maternal Grandmother    Heart disease Maternal Grandmother    Hypertension Maternal Grandmother    Hyperlipidemia Maternal Grandmother    Stroke Maternal Grandmother    Alcohol abuse Maternal Grandfather    Arthritis Maternal Grandfather    Depression Maternal Grandfather    Hearing loss Maternal Grandfather    Heart disease Maternal Grandfather    Stroke Maternal Grandfather    Arthritis Paternal Grandmother    Miscarriages / Stillbirths Paternal Grandmother    Arthritis Paternal Grandfather    Depression Paternal Grandfather    Heart disease Paternal Grandfather    Hypertension Paternal Grandfather    Stroke Paternal Grandfather    Diabetes Paternal Aunt    Breast cancer Neg Hx    Colon cancer Neg Hx     Allergies  Allergen Reactions   Dust Mite Extract     Sneezing, itchy, watery eyes   Latex     Tingling sensation   Pollen Extract     Sneezing, itchy, watery eyes       Latest Ref Rng & Units 09/06/2023    4:12 PM 07/19/2022    7:35 AM 08/10/2021    9:15 AM  CBC  WBC 4.0 - 10.5 K/uL 12.6  9.9  9.5   Hemoglobin 12.0 - 15.0 g/dL 88.8  87.7  88.4   Hematocrit 36.0 - 46.0 % 36.0  38.4  35.5   Platelets 150.0 - 400.0 K/uL 296.0  239.0  232.0       CMP     Component Value Date/Time   NA 139 09/06/2023 1612   NA 141 04/29/2020 1015   NA 141 02/21/2012 0947   K 4.5 09/06/2023 1612   K 3.7 02/21/2012 0947   CL 101 09/06/2023 1612   CL 110 (H) 02/21/2012 0947   CO2 23 09/06/2023 1612   CO2 25 02/21/2012 0947    GLUCOSE 116 (H) 09/06/2023 1612   GLUCOSE 145 (H) 02/21/2012 0947   BUN 18 09/06/2023 1612   BUN 15 04/29/2020 1015   BUN 13 02/21/2012 0947   CREATININE 0.90 09/06/2023 1612   CREATININE 0.69 02/21/2012 0947   CALCIUM 9.3 09/06/2023 1612   CALCIUM 8.4 (L) 02/21/2012 0947   PROT 6.3 07/19/2022 0735   PROT 6.7 04/29/2020 1015   PROT 6.5 02/21/2012 0947   ALBUMIN  4.0 07/19/2022 0735   ALBUMIN  4.2 04/29/2020 1015   ALBUMIN  3.2 (L) 02/21/2012 0947   AST 13 07/19/2022 0735   AST 22 02/21/2012 0947   ALT 18 07/19/2022 0735   ALT 30 02/21/2012 0947   ALKPHOS 52 07/19/2022 0735   ALKPHOS 89 02/21/2012 0947   BILITOT 0.8 07/19/2022 0735   BILITOT 0.8 04/29/2020 1015   BILITOT 1.1 (H) 02/21/2012 0947   GFR 72.47 09/06/2023 1612   GFRNONAA >60 09/28/2020 1440   GFRNONAA >60 02/21/2012 0947     No results found.     Assessment & Plan:   1. Lymphedema (Primary) Patient is aware of her leg swelling.  She wears bilateral compression socks daily.  Patient more concerned with the increase in numbness and swelling to both her upper and lower extremities.  We discussed in detail today her arterial ultrasounds of her bilateral lower extremities with ABIs.  Today's right ABI is 1.2 and today's left ABI 1.1.  Therefore she has good arterial flow and I do not recommend any intervention at this time and I also do not believe this is contributing to her numbness and tingling. It appears that her numbness and tingling may be more of a neurogenic process.  I offered her a neurosurgery consult to further investigate her numbness and tingling but she declines at this time.  I also discussed doing bilateral lower extremity venous Dopplers to check for DVTs and reflux but she also declines at this point in time.  She primarily was concerned about her arterial blood flow to her legs and with her ABIs being normal she is comfortable with moving on at this time.  I offered her follow-up at her discretion and she  agrees that an as needed follow-up is all she needs at this time.  2. Primary hypertension Continue antihypertensive medications as already ordered, these medications have been reviewed and there are no changes at this time.  3. Paroxysmal atrial fibrillation (HCC) Continue antiarrhythmia medications as already ordered, these medications have been reviewed and there are no changes at this time.  Continue anticoagulation as ordered by Cardiology Service  4. Morbid obesity with BMI of 70 and over, adult [facility]) We discussed in detail today how her weight can and most likely may be contributing to her numbness and tingling. Suggested see PCP about weight loss medications    Current Outpatient Medications on File Prior to Visit  Medication Sig Dispense Refill   Alpha-Lipoic Acid 600 MG TABS Take 600 mg by mouth daily.     apixaban  (ELIQUIS ) 5 MG TABS tablet Take 1 tablet (5 mg total) by mouth 2 (two) times daily. 180 tablet 1   doxycycline  (VIBRAMYCIN ) 100 MG capsule Take 1 capsule (100 mg total) by mouth daily with food. 30 capsule 3   FLUoxetine  (PROZAC ) 20 MG capsule Take 3 capsules (60 mg total) by mouth daily. (stop 40mg  dose) 270 capsule 3   fluticasone  (FLONASE ) 50 MCG/ACT nasal spray Place 2 sprays into both nostrils daily as needed for allergies or rhinitis. 16 g 2   gabapentin  (NEURONTIN ) 400 MG capsule Take 1 capsule (400 mg total) by mouth 2 (two) times daily.     glucosamine-chondroitin 500-400 MG tablet Take 1 tablet by mouth daily.     hydrochlorothiazide  (HYDRODIURIL ) 25 MG tablet Take 1 tablet (25 mg total) by mouth daily. 90 tablet 3   lisinopril  (ZESTRIL ) 30 MG tablet Take 1 tablet (30 mg total) by mouth daily. 90 tablet 3   loratadine (CLARITIN) 10 MG tablet Take 10 mg by mouth daily.     medroxyPROGESTERone  (PROVERA ) 2.5 MG tablet Take 1 tablet (2.5 mg total) by mouth daily. 90 tablet 3   metFORMIN  (GLUCOPHAGE ) 500 MG tablet Take 1 tablet (500 mg total) by mouth daily with  breakfast. 90 tablet 3   metoprolol  succinate (TOPROL  XL) 25 MG 24 hr tablet Take 1 tablet (25 mg total) by mouth daily. 90 tablet 3   metroNIDAZOLE  (METROGEL ) 0.75 % gel Apply thin layer on face once or twice daily 45 g 6   Multiple Vitamin (MULTIVITAMIN) capsule Take 1 capsule by mouth daily.     No current facility-administered medications on file prior to visit.    There are no Patient Instructions on file for this visit. No follow-ups on file.   Gwendlyn  JONELLE Shank, NP

## 2023-09-25 ENCOUNTER — Encounter (INDEPENDENT_AMBULATORY_CARE_PROVIDER_SITE_OTHER): Payer: Self-pay | Admitting: Vascular Surgery

## 2023-09-26 ENCOUNTER — Other Ambulatory Visit: Payer: Self-pay

## 2023-09-26 ENCOUNTER — Other Ambulatory Visit (HOSPITAL_COMMUNITY): Payer: Self-pay

## 2023-09-27 ENCOUNTER — Encounter: Payer: Self-pay | Admitting: Family

## 2023-10-12 ENCOUNTER — Other Ambulatory Visit: Payer: Self-pay

## 2023-10-12 ENCOUNTER — Other Ambulatory Visit (HOSPITAL_COMMUNITY): Payer: Self-pay

## 2023-10-12 MED ORDER — MEDROXYPROGESTERONE ACETATE 2.5 MG PO TABS
2.5000 mg | ORAL_TABLET | Freq: Every day | ORAL | 3 refills | Status: AC
  Filled 2023-10-12 – 2023-10-13 (×3): qty 90, 90d supply, fill #0
  Filled 2024-01-08: qty 90, 90d supply, fill #1
  Filled 2024-04-10: qty 90, 90d supply, fill #2

## 2023-10-13 ENCOUNTER — Other Ambulatory Visit (HOSPITAL_COMMUNITY): Payer: Self-pay

## 2023-10-15 ENCOUNTER — Other Ambulatory Visit: Payer: Self-pay

## 2023-10-15 ENCOUNTER — Other Ambulatory Visit (HOSPITAL_COMMUNITY): Payer: Self-pay

## 2023-10-15 ENCOUNTER — Ambulatory Visit: Admitting: Podiatry

## 2023-10-15 ENCOUNTER — Other Ambulatory Visit: Payer: Self-pay | Admitting: Family

## 2023-10-15 DIAGNOSIS — G629 Polyneuropathy, unspecified: Secondary | ICD-10-CM

## 2023-10-17 ENCOUNTER — Other Ambulatory Visit (HOSPITAL_COMMUNITY): Payer: Self-pay

## 2023-10-17 ENCOUNTER — Ambulatory Visit: Admitting: Podiatry

## 2023-10-17 MED ORDER — GABAPENTIN 400 MG PO CAPS
400.0000 mg | ORAL_CAPSULE | Freq: Every day | ORAL | 3 refills | Status: DC
  Filled 2023-10-17: qty 90, 90d supply, fill #0

## 2023-10-17 NOTE — Telephone Encounter (Signed)
 Spoke to pt she stated that she is taking the 400 mg twice a day, rx has been sent in to the pharmacy

## 2023-10-23 ENCOUNTER — Other Ambulatory Visit (HOSPITAL_COMMUNITY): Payer: Self-pay

## 2023-10-23 ENCOUNTER — Encounter: Payer: Self-pay | Admitting: Family

## 2023-10-23 DIAGNOSIS — G629 Polyneuropathy, unspecified: Secondary | ICD-10-CM

## 2023-10-23 MED ORDER — GABAPENTIN 400 MG PO CAPS
400.0000 mg | ORAL_CAPSULE | Freq: Two times a day (BID) | ORAL | 0 refills | Status: DC
  Filled 2023-10-23 – 2023-10-26 (×3): qty 180, 90d supply, fill #0

## 2023-10-24 ENCOUNTER — Other Ambulatory Visit (HOSPITAL_COMMUNITY): Payer: Self-pay

## 2023-10-26 ENCOUNTER — Other Ambulatory Visit (HOSPITAL_COMMUNITY): Payer: Self-pay

## 2023-11-13 ENCOUNTER — Ambulatory Visit (INDEPENDENT_AMBULATORY_CARE_PROVIDER_SITE_OTHER): Admitting: Podiatry

## 2023-11-13 DIAGNOSIS — M79674 Pain in right toe(s): Secondary | ICD-10-CM

## 2023-11-13 DIAGNOSIS — M79675 Pain in left toe(s): Secondary | ICD-10-CM | POA: Diagnosis not present

## 2023-11-13 DIAGNOSIS — B351 Tinea unguium: Secondary | ICD-10-CM

## 2023-11-13 NOTE — Progress Notes (Unsigned)
   Subjective:  Patient ID: Belinda Garrett, female    DOB: September 15, 1935,  MRN: [id number]  Chief Complaint  Patient presents with   Nail Problem    Nail trim    53 y.o. female returns for the above complaint.  Patient presents with thickened elongated dystrophic mycotic toenails x 10 mild pain on palpation hurts with ambulation hurts with pressure she would like me to be done she is not able to do it herself.  Objective:  There were no vitals filed for this visit. Podiatric Exam: Vascular: dorsalis pedis and posterior tibial pulses are palpable bilateral. Capillary return is immediate. Temperature gradient is WNL. Skin turgor WNL  Sensorium: Normal Semmes Weinstein monofilament test. Normal tactile sensation bilaterally. Nail Exam: Pt has thick disfigured discolored nails with subungual debris noted bilateral entire nail hallux through fifth toenails.  Pain on palpation to the nails. Ulcer Exam: There is no evidence of ulcer or pre-ulcerative changes or infection. Orthopedic Exam: Muscle tone and strength are WNL. No limitations in general ROM. No crepitus or effusions noted.  Skin: No Porokeratosis. No infection or ulcers    Assessment & Plan:   1. Pain due to onychomycosis of toenails of both feet      Patient was evaluated and treated and all questions answered.  Onychomycosis with pain  -Nails palliatively debrided as below. -Educated on self-care  Procedure: Nail Debridement Rationale: pain  Type of Debridement: manual, sharp debridement. Instrumentation: Nail nipper, rotary burr. Number of Nails: 10  Procedures and Treatment: Consent by patient was obtained for treatment procedures. The patient understood the discussion of treatment and procedures well. All questions were answered thoroughly reviewed. Debridement of mycotic and hypertrophic toenails, 1 through 5 bilateral and clearing of subungual debris. No ulceration, no infection noted.  Return Visit-Office Procedure:  Patient instructed to return to the office for a follow up visit 3 months for continued evaluation and treatment.  Nicholes Rough, DPM    No follow-ups on file.

## 2023-11-14 ENCOUNTER — Other Ambulatory Visit (HOSPITAL_COMMUNITY): Payer: Self-pay

## 2023-11-28 DIAGNOSIS — H6981 Other specified disorders of Eustachian tube, right ear: Secondary | ICD-10-CM | POA: Diagnosis not present

## 2023-11-28 DIAGNOSIS — G4733 Obstructive sleep apnea (adult) (pediatric): Secondary | ICD-10-CM | POA: Diagnosis not present

## 2023-12-10 ENCOUNTER — Other Ambulatory Visit (HOSPITAL_COMMUNITY): Payer: Self-pay

## 2023-12-11 DIAGNOSIS — G4733 Obstructive sleep apnea (adult) (pediatric): Secondary | ICD-10-CM | POA: Diagnosis not present

## 2023-12-12 ENCOUNTER — Other Ambulatory Visit (INDEPENDENT_AMBULATORY_CARE_PROVIDER_SITE_OTHER)

## 2023-12-12 DIAGNOSIS — R899 Unspecified abnormal finding in specimens from other organs, systems and tissues: Secondary | ICD-10-CM

## 2023-12-12 DIAGNOSIS — D649 Anemia, unspecified: Secondary | ICD-10-CM | POA: Diagnosis not present

## 2023-12-12 LAB — CBC WITH DIFFERENTIAL/PLATELET
Basophils Absolute: 0 K/uL (ref 0.0–0.1)
Basophils Relative: 0.4 % (ref 0.0–3.0)
Eosinophils Absolute: 0.1 K/uL (ref 0.0–0.7)
Eosinophils Relative: 1.2 % (ref 0.0–5.0)
HCT: 39.8 % (ref 36.0–46.0)
Hemoglobin: 12.4 g/dL (ref 12.0–15.0)
Lymphocytes Relative: 16.2 % (ref 12.0–46.0)
Lymphs Abs: 1.8 K/uL (ref 0.7–4.0)
MCHC: 31 g/dL (ref 30.0–36.0)
MCV: 77.8 fl — ABNORMAL LOW (ref 78.0–100.0)
Monocytes Absolute: 0.7 K/uL (ref 0.1–1.0)
Monocytes Relative: 6.3 % (ref 3.0–12.0)
Neutro Abs: 8.2 K/uL — ABNORMAL HIGH (ref 1.4–7.7)
Neutrophils Relative %: 75.9 % (ref 43.0–77.0)
Platelets: 310 K/uL (ref 150.0–400.0)
RBC: 5.12 Mil/uL — ABNORMAL HIGH (ref 3.87–5.11)
RDW: 19.1 % — ABNORMAL HIGH (ref 11.5–15.5)
WBC: 10.9 K/uL — ABNORMAL HIGH (ref 4.0–10.5)

## 2023-12-12 LAB — B12 AND FOLATE PANEL
Folate: 11.2 ng/mL (ref >5.9)
Vitamin B-12: 738 pg/mL (ref 211–911)

## 2023-12-13 ENCOUNTER — Ambulatory Visit: Payer: Self-pay | Admitting: Family

## 2023-12-13 DIAGNOSIS — R899 Unspecified abnormal finding in specimens from other organs, systems and tissues: Secondary | ICD-10-CM

## 2023-12-13 LAB — IRON,TIBC AND FERRITIN PANEL
%SAT: 19 % (ref 16–45)
Ferritin: 25 ng/mL (ref 16–232)
Iron: 80 ug/dL (ref 45–160)
TIBC: 412 ug/dL (ref 250–450)

## 2023-12-24 ENCOUNTER — Other Ambulatory Visit: Payer: Self-pay

## 2023-12-24 ENCOUNTER — Other Ambulatory Visit: Payer: Self-pay | Admitting: Family

## 2023-12-24 ENCOUNTER — Other Ambulatory Visit (HOSPITAL_COMMUNITY): Payer: Self-pay

## 2023-12-24 DIAGNOSIS — G629 Polyneuropathy, unspecified: Secondary | ICD-10-CM

## 2023-12-24 MED ORDER — GABAPENTIN 400 MG PO CAPS
400.0000 mg | ORAL_CAPSULE | Freq: Two times a day (BID) | ORAL | 0 refills | Status: DC
  Filled 2023-12-24 – 2024-01-22 (×3): qty 180, 90d supply, fill #0

## 2023-12-25 ENCOUNTER — Other Ambulatory Visit (HOSPITAL_COMMUNITY): Payer: Self-pay

## 2023-12-25 ENCOUNTER — Ambulatory Visit

## 2023-12-31 ENCOUNTER — Other Ambulatory Visit (HOSPITAL_COMMUNITY): Payer: Self-pay

## 2023-12-31 ENCOUNTER — Ambulatory Visit: Admitting: Family

## 2023-12-31 ENCOUNTER — Other Ambulatory Visit: Payer: Self-pay

## 2023-12-31 VITALS — BP 122/70 | HR 94 | Temp 98.6°F | Ht 61.0 in | Wt >= 6400 oz

## 2023-12-31 DIAGNOSIS — F39 Unspecified mood [affective] disorder: Secondary | ICD-10-CM | POA: Diagnosis not present

## 2023-12-31 DIAGNOSIS — Z23 Encounter for immunization: Secondary | ICD-10-CM | POA: Diagnosis not present

## 2023-12-31 DIAGNOSIS — G4700 Insomnia, unspecified: Secondary | ICD-10-CM

## 2023-12-31 DIAGNOSIS — N950 Postmenopausal bleeding: Secondary | ICD-10-CM | POA: Diagnosis not present

## 2023-12-31 DIAGNOSIS — N938 Other specified abnormal uterine and vaginal bleeding: Secondary | ICD-10-CM | POA: Diagnosis not present

## 2023-12-31 DIAGNOSIS — D649 Anemia, unspecified: Secondary | ICD-10-CM | POA: Diagnosis not present

## 2023-12-31 MED ORDER — TRAZODONE HCL 50 MG PO TABS
25.0000 mg | ORAL_TABLET | Freq: Every evening | ORAL | 3 refills | Status: DC | PRN
  Filled 2023-12-31: qty 30, 30d supply, fill #0
  Filled 2024-02-17: qty 30, 30d supply, fill #1

## 2023-12-31 NOTE — Progress Notes (Unsigned)
 Assessment & Plan:  There are no diagnoses linked to this encounter.   Return precautions given.   Risks, benefits, and alternatives of the medications and treatment plan prescribed today were discussed, and patient expressed understanding.   Education regarding symptom management and diagnosis given to patient on AVS either electronically or printed.  No follow-ups on file.  Rollene Northern, FNP  Subjective:    Patient ID: Belinda Garrett, female    DOB: Feb 05, 1971, 53 y.o.   MRN: [id number]  CC: Belinda Garrett is a 53 y.o. female who presents today for follow up.   HPI: HPI Hgb 11.1-> 12.4 11/20/23  Pap smear 01/14/20 NILM, neg HPV Increased gabapentin  to 400mg  BID June 2025 Compliant with  lisinopril  30mg  every day, hydrochlorothiazide  25mg  every day.  Consult with vascular 09/19/23 advised neurosurgery consult  Xr lumbar 04/2018 Prominent anterolisthesis L4 upon L5 measuring 18 mm. Pars defects are not excluded. CT of the lumbar spine can be performed to further delineate.   Degenerative disc disease at L5-S1.   No acute bony pathology. Allergies: Dust mite extract, Latex, and Pollen extract Current Outpatient Medications on File Prior to Visit  Medication Sig Dispense Refill   Alpha-Lipoic Acid 600 MG TABS Take 600 mg by mouth daily.     apixaban  (ELIQUIS ) 5 MG TABS tablet Take 1 tablet (5 mg total) by mouth 2 (two) times daily. 180 tablet 1   doxycycline  (VIBRAMYCIN ) 100 MG capsule Take 1 capsule (100 mg total) by mouth daily with food. 30 capsule 3   FLUoxetine  (PROZAC ) 20 MG capsule Take 3 capsules (60 mg total) by mouth daily. (stop 40mg  dose) 270 capsule 3   fluticasone  (FLONASE ) 50 MCG/ACT nasal spray Place 2 sprays into both nostrils daily as needed for allergies or rhinitis. 16 g 2   gabapentin  (NEURONTIN ) 400 MG capsule Take 1 capsule (400 mg total) by mouth 2 (two) times daily. 180 capsule 0   glucosamine-chondroitin 500-400 MG tablet Take 1 tablet by mouth  daily.     hydrochlorothiazide  (HYDRODIURIL ) 25 MG tablet Take 1 tablet (25 mg total) by mouth daily. 90 tablet 3   lisinopril  (ZESTRIL ) 30 MG tablet Take 1 tablet (30 mg total) by mouth daily. 90 tablet 3   loratadine (CLARITIN) 10 MG tablet Take 10 mg by mouth daily.     medroxyPROGESTERone  (PROVERA ) 2.5 MG tablet Take 1 tablet (2.5 mg total) by mouth daily. 90 tablet 3   metFORMIN  (GLUCOPHAGE ) 500 MG tablet Take 1 tablet (500 mg total) by mouth daily with breakfast. 90 tablet 3   metoprolol  succinate (TOPROL  XL) 25 MG 24 hr tablet Take 1 tablet (25 mg total) by mouth daily. 90 tablet 3   metroNIDAZOLE  (METROGEL ) 0.75 % gel Apply thin layer on face once or twice daily 45 g 6   Multiple Vitamin (MULTIVITAMIN) capsule Take 1 capsule by mouth daily.     gabapentin  (NEURONTIN ) 400 MG capsule Take 1 capsule (400 mg total) by mouth at bedtime. 90 capsule 3   No current facility-administered medications on file prior to visit.    Review of Systems    Objective:    BP 122/70   Pulse 94   Temp 98.6 F (37 C) (Oral)   Ht 5' 1 (1.549 m)   Wt (!) 460 lb (208.7 kg)   LMP  (LMP Unknown)   SpO2 96%   BMI 86.92 kg/m  BP Readings from Last 3 Encounters:  12/31/23 122/70  09/19/23 124/60  09/06/23 120/70  Wt Readings from Last 3 Encounters:  12/31/23 (!) 460 lb (208.7 kg)  09/19/23 (!) 460 lb (208.7 kg)  09/06/23 (!) 463 lb 6.4 oz (210.2 kg)    Physical Exam

## 2023-12-31 NOTE — Assessment & Plan Note (Signed)
 Advised MRI lumbar spine; she politely declines at this time

## 2023-12-31 NOTE — Patient Instructions (Addendum)
 Urine ordered as well  Ordered transvaginal ultrasound and referral for colonoscopy  Let us  know if you dont hear back within 2 weeks in regards to an appointment being scheduled.   So that you are aware, if you are [facility] MyChart user , please pay attention to your MyChart messages as you may receive a MyChart message with a phone number to call and schedule this test/appointment own your own from our referral coordinator. This is a new process so I do not want you to miss this message.  If you are not a MyChart user, you will receive a phone call.

## 2024-01-03 DIAGNOSIS — G4700 Insomnia, unspecified: Secondary | ICD-10-CM | POA: Insufficient documentation

## 2024-01-03 NOTE — Assessment & Plan Note (Signed)
 Chronic, poorly controlled.  Likely at her aggravated by vasomotor symptoms from perimenopause.  Trial of trazodone 50 mg and titrate.

## 2024-01-03 NOTE — Assessment & Plan Note (Signed)
 Chronic, symptomatically stable.  Based on age, likely postmenopausal status.  Unable to review reported history of transvaginal ultrasound in Epic.  Pending FSH, repeat transvaginal ultrasound.  Will collaborate with Greig Hummer.

## 2024-01-03 NOTE — Assessment & Plan Note (Signed)
 Hemoglobin has normalized with periodic ferrous sulfate supplementation.  Patient is overdue for colonoscopy and advised of importance of obtaining colonoscopy to assure no underlying blood loss from the gut, malignancy.  Pending repeat CBC, and urinalysis.

## 2024-01-08 ENCOUNTER — Other Ambulatory Visit (HOSPITAL_COMMUNITY): Payer: Self-pay

## 2024-01-09 ENCOUNTER — Other Ambulatory Visit: Payer: Self-pay

## 2024-01-09 ENCOUNTER — Other Ambulatory Visit (HOSPITAL_COMMUNITY): Payer: Self-pay

## 2024-01-09 ENCOUNTER — Other Ambulatory Visit: Payer: Self-pay | Admitting: Family

## 2024-01-09 DIAGNOSIS — R7303 Prediabetes: Secondary | ICD-10-CM

## 2024-01-09 MED ORDER — METFORMIN HCL 500 MG PO TABS
500.0000 mg | ORAL_TABLET | Freq: Every day | ORAL | 3 refills | Status: DC
  Filled 2024-01-09: qty 90, 90d supply, fill #0
  Filled 2024-03-16 – 2024-03-25 (×2): qty 90, 90d supply, fill #1

## 2024-01-09 NOTE — Telephone Encounter (Signed)
 Copied from CRM [id number]. Topic: Clinical - Medication Refill >> Jan 09, 2024  8:10 AM Eva FALCON wrote: Medication:  metFORMIN  (GLUCOPHAGE ) 500 MG tablet   Has the patient contacted their pharmacy? Yes, states pharmacy messages states its waiting for physician approval. (Agent: If no, request that the patient contact the pharmacy for the refill. If patient does not wish to contact the pharmacy document the reason why and proceed with request.) (Agent: If yes, when and what did the pharmacy advise?)  This is the patient's preferred pharmacy:  [facility] [address] [place] [address] Phone: [phone] Fax: [phone]  Is this the correct pharmacy for this prescription? Yes If no, delete pharmacy and type the correct one.   Has the prescription been filled recently? Yes  Is the patient out of the medication? No  Has the patient been seen for an appointment in the last year OR does the patient have an upcoming appointment? Yes  Can we respond through MyChart? Yes  Agent: Please be advised that Rx refills may take up to 3 business days. We ask that you follow-up with your pharmacy.

## 2024-01-11 ENCOUNTER — Other Ambulatory Visit: Payer: Self-pay | Admitting: Family

## 2024-01-11 ENCOUNTER — Ambulatory Visit
Admission: RE | Admit: 2024-01-11 | Discharge: 2024-01-11 | Disposition: A | Discharge status: Home / Self Care | Source: Ambulatory Visit | Attending: Family | Admitting: Family

## 2024-01-11 DIAGNOSIS — N938 Other specified abnormal uterine and vaginal bleeding: Secondary | ICD-10-CM

## 2024-01-11 DIAGNOSIS — F39 Unspecified mood [affective] disorder: Secondary | ICD-10-CM

## 2024-01-11 DIAGNOSIS — D649 Anemia, unspecified: Secondary | ICD-10-CM

## 2024-01-11 DIAGNOSIS — N950 Postmenopausal bleeding: Secondary | ICD-10-CM | POA: Diagnosis not present

## 2024-01-11 DIAGNOSIS — G4700 Insomnia, unspecified: Secondary | ICD-10-CM

## 2024-01-11 DIAGNOSIS — D251 Intramural leiomyoma of uterus: Secondary | ICD-10-CM | POA: Diagnosis not present

## 2024-01-11 DIAGNOSIS — N939 Abnormal uterine and vaginal bleeding, unspecified: Secondary | ICD-10-CM | POA: Diagnosis not present

## 2024-01-11 DIAGNOSIS — Z23 Encounter for immunization: Secondary | ICD-10-CM

## 2024-01-11 DIAGNOSIS — N854 Malposition of uterus: Secondary | ICD-10-CM | POA: Diagnosis not present

## 2024-01-15 ENCOUNTER — Ambulatory Visit: Payer: Self-pay | Admitting: Family

## 2024-01-21 DIAGNOSIS — G5602 Carpal tunnel syndrome, left upper limb: Secondary | ICD-10-CM | POA: Diagnosis not present

## 2024-01-21 DIAGNOSIS — G5621 Lesion of ulnar nerve, right upper limb: Secondary | ICD-10-CM | POA: Diagnosis not present

## 2024-01-22 ENCOUNTER — Other Ambulatory Visit (HOSPITAL_COMMUNITY): Payer: Self-pay

## 2024-01-22 ENCOUNTER — Other Ambulatory Visit: Payer: Self-pay

## 2024-01-23 ENCOUNTER — Other Ambulatory Visit: Payer: Self-pay

## 2024-01-23 ENCOUNTER — Ambulatory Visit

## 2024-01-23 ENCOUNTER — Other Ambulatory Visit (HOSPITAL_COMMUNITY): Payer: Self-pay

## 2024-01-23 DIAGNOSIS — M793 Panniculitis, unspecified: Secondary | ICD-10-CM | POA: Diagnosis not present

## 2024-01-23 DIAGNOSIS — I872 Venous insufficiency (chronic) (peripheral): Secondary | ICD-10-CM

## 2024-01-23 DIAGNOSIS — L719 Rosacea, unspecified: Secondary | ICD-10-CM

## 2024-01-23 DIAGNOSIS — L820 Inflamed seborrheic keratosis: Secondary | ICD-10-CM | POA: Diagnosis not present

## 2024-01-23 DIAGNOSIS — L821 Other seborrheic keratosis: Secondary | ICD-10-CM

## 2024-01-23 MED ORDER — DOXYCYCLINE MONOHYDRATE 100 MG PO TABS
100.0000 mg | ORAL_TABLET | Freq: Every day | ORAL | 3 refills | Status: AC | PRN
  Filled 2024-01-23: qty 14, 14d supply, fill #0

## 2024-01-23 MED ORDER — METRONIDAZOLE 0.75 % EX CREA
TOPICAL_CREAM | Freq: Two times a day (BID) | CUTANEOUS | 3 refills | Status: DC
  Filled 2024-01-23: qty 45, 30d supply, fill #0

## 2024-01-23 MED ORDER — TRIAMCINOLONE ACETONIDE 0.1 % EX OINT
TOPICAL_OINTMENT | CUTANEOUS | 5 refills | Status: AC
Start: 2024-01-23 — End: ?
  Filled 2024-01-23: qty 30, 30d supply, fill #0

## 2024-01-23 NOTE — Progress Notes (Signed)
 Subjective   Belinda Garrett is a 53 y.o. female who presents for the following: Rosacea, seborrheic dermatitis, and stasis dermatitis. Patient is new patient.  Today patient reports: Patient with hx seb derm, stasis dermatitis and rosacea. Patient was being seen by [facility]. Patient was taking doxycycline  100 mg once daily prn flares and metrogel  1% and metrogel  0.75% BID. For seb derm at ears/behind ears using HC cream/ointment for itching. No personal hx of skin cancer.   Review of Systems:    No other skin or systemic complaints except as noted in HPI or Assessment and Plan.  The following portions of the chart were reviewed this encounter and updated as appropriate: medications, allergies, medical history  Relevant Medical History:  n/a   Objective  Well appearing patient in no apparent distress; mood and affect are within normal limits. Examination was performed of the: Focused Exam of: Face, bilateral lower legs   Examination notable for: Seborrheic Keratosis(es): Stuck-on appearing keratotic papule(s) on the trunk, some  irritated with redness, crusting, edema, and/or partial avulsion  - Woody induration of lower legs with marked hyperpigmentation and orange discoloration. No ulcerations. Distal lower extremities with verrucous plaques  Examination limited by: Undergarments, Shoes or socks , Clothing, and Patient deferred removal     Face x1 Stuck on waxy paps with erythema  Assessment & Plan   Benign Lesions/ Findings:  -Seborrheic Keratosis  - Reassurance provided regarding the benign appearance of lesions noted on exam today; no treatment is indicated in the absence of symptoms/changes. - Reinforced importance of photoprotective strategies including liberal and frequent sunscreen use of a broad-spectrum SPF 30 or greater, use of protective clothing, and sun avoidance for prevention of cutaneous malignancy and photoaging.  Counseled patient on the importance of  regular self-skin monitoring as well as routine clinical skin examinations as scheduled.   Stasis dermatitis, Lipodermatosclerosis - Informed the patient that this condition is caused by swelling in his legs and reducing this swelling is the ultimate treatment - Encouraged to elevate their legs for 1 hour three times a day - Compression stockings recommended  Start triamcinolone  ointment 0.1% twice daily to affected areas of skin prn itch  Discussed side effect of potent topical steroids including atrophy, dyspigmentation, striae, telangectasia, folliculitis, loss of skin pigment, hair growth, tachyphylaxis, risk of systemic absorption with missuse.  Rosacea Chronic and persistent condition with duration or expected duration over one year. Condition is symptomatic and bothersome to patient. Patient is flaring and not currently at treatment goal.   - Reviewed etiology and probable recurrent nature of this condition - Discussed potential triggers including caffeine, heat, sun exposure, chocolate, etc. and recommended patient attempt to identify and avoid triggers - Continue doxycycline  100 mg once daily x 14 days prn flares. - Discussed side effects and precautions with doxycycline  including taking with meal, waiting at least 30 minutes before lying down at night, increased sun sensitivity, and to stop medication if becomes pregnant or breastfeeding -Start metrocream  0.75% apply topically once daily - Sun avoidance, protective clothing sunscreen discussed - Telangiectasias will likely not fade completely, laser removal may be considered as a cosmetic procedure  Level of service outlined above   Procedures, orders, diagnosis for this visit:  INFLAMED SEBORRHEIC KERATOSIS Face x1 Symptomatic, irritating, patient would like treated. Destruction of lesion - Face x1 Complexity: simple   Destruction method: cryotherapy   Informed consent: discussed and consent obtained   Timeout:  patient name,  date of birth, surgical  site, and procedure verified Lesion destroyed using liquid nitrogen: Yes   Region frozen until ice ball extended beyond lesion: Yes   Cryo cycles: 1 or 2. Outcome: patient tolerated procedure well with no complications   Post-procedure details: wound care instructions given   Additional details:  Prior to procedure, discussed risks of blister formation, small wound, skin dyspigmentation, or rare scar following cryotherapy. Recommend Vaseline ointment to treated areas while healing.    Inflamed seborrheic keratosis -     Destruction of lesion  Other orders -     Triamcinolone  Acetonide; Apply 1 gram twice daily to affected areas of skin. Stop once resolved and restart as needed for flares. Avoid use on face, armpits, groin unless otherwise indicated.  Dispense: 30 g; Refill: 5 -     Doxycycline  Monohydrate; Take 100 mg once daily for 14 days prn flares.  Dispense: 14 tablet; Refill: 3 -     metroNIDAZOLE ; Apply topically 2 (two) times daily.  Dispense: 45 g; Refill: 3    Return to clinic: Return in about 1 year (around 01/22/2025) for Seb Derm, Rosacea, stasis dermatitis , w/ Dr. Raymund.  I, Jacquelynn V. Wilfred, CMA, am acting as scribe for Lauraine JAYSON Raymund, MD.  Documentation: I have reviewed the above documentation for accuracy and completeness, and I agree with the above.  Lauraine JAYSON Raymund, MD

## 2024-01-23 NOTE — Patient Instructions (Addendum)
 Cryosurgery  Cryosurgery ("freezing") uses liquid nitrogen to destroy certain types of skin lesions. Lowering the temperature of the lesion in a small area surrounding skin destroys the lesion. Immediately following cryosurgery, you will notice redness and swelling of the treatment area. Blistering or weeping may occur, lasting approximately one week which will then be followed by crusting. Most areas will heal completely in 10 to 14 days.  Wash the treated areas daily. Allow soap and water to run over the areas, but do not scrub. Should a scab or crust form, allow it to fall off on its own. Do not remove or pick at it. Application of an ointment  and a bandage may make you feel more comfortable, but it is not necessary. Some people develop an allergy to Neosporin, so we recommend that Vaseline or  Aquaphor be used.  The cryotherapy site will be more sensitive than your surrounding skin. Keep it covered, and remember to apply sunscreen every day to all your sun exposed skin. A scar may remain which is lighter or pinker than your normal skin. Your body will continue to improve your scar for up to one year; however a light-colored scar may remain.  Infection following cryotherapy is rare. However if you are worried about the appearance of the treated area, contact your doctor. We have a physician on call at all times. If you have any concerns about the site, please call our clinic at [phone]    Due to recent changes in healthcare laws, you may see results of your pathology and/or laboratory studies on MyChart before the doctors have had a chance to review them. We understand that in some cases there may be results that are confusing or concerning to you. Please understand that not all results are received at the same time and often the doctors may need to interpret multiple results in order to provide you with the best plan of care or course of treatment. Therefore, we ask that you please give us  2  business days to thoroughly review all your results before contacting the office for clarification. Should we see a critical lab result, you will be contacted sooner.   If You Need Anything After Your Visit  If you have any questions or concerns for your doctor, please call our main line at [phone] and press option 4 to reach your doctor's medical assistant. If no one answers, please leave a voicemail as directed and we will return your call as soon as possible. Messages left after 4 pm will be answered the following business day.   You may also send us  a message via MyChart. We typically respond to MyChart messages within 1-2 business days.  For prescription refills, please ask your pharmacy to contact our office. Our fax number is [phone].  If you have an urgent issue when the clinic is closed that cannot wait until the next business day, you can page your doctor at the number below.    Please note that while we do our best to be available for urgent issues outside of office hours, we are not available 24/7.   If you have an urgent issue and are unable to reach us , you may choose to seek medical care at your doctor's office, retail clinic, urgent care center, or emergency room.  If you have a medical emergency, please immediately call 911 or go to the emergency department.  Pager Numbers  - Dr. Hester: [phone]  - Dr. Jackquline: [phone]  - Dr. Claudene: [phone]  In the event of inclement weather, please call our main line at [phone] for an update on the status of any delays or closures.  Dermatology Medication Tips: Please keep the boxes that topical medications come in in order to help keep track of the instructions about where and how to use these. Pharmacies typically print the medication instructions only on the boxes and not directly on the medication tubes.   If your medication is too expensive, please contact our office at [phone] option 4 or  send us  a message through MyChart.   We are unable to tell what your co-pay for medications will be in advance as this is different depending on your insurance coverage. However, we may be able to find a substitute medication at lower cost or fill out paperwork to get insurance to cover a needed medication.   If a prior authorization is required to get your medication covered by your insurance company, please allow us  1-2 business days to complete this process.  Drug prices often vary depending on where the prescription is filled and some pharmacies may offer cheaper prices.  The website www.goodrx.com contains coupons for medications through different pharmacies. The prices here do not account for what the cost may be with help from insurance (it may be cheaper with your insurance), but the website can give you the price if you did not use any insurance.  - You can print the associated coupon and take it with your prescription to the pharmacy.  - You may also stop by our office during regular business hours and pick up a GoodRx coupon card.  - If you need your prescription sent electronically to a different pharmacy, notify our office through [facility] or by phone at [phone] option 4.     Si Usted Necesita Algo Despus de Su Visita  Tambin puede enviarnos un mensaje a travs de Clinical cytogeneticist. Por lo general respondemos a los mensajes de MyChart en el transcurso de 1 a 2 das hbiles.  Para renovar recetas, por favor pida a su farmacia que se ponga en contacto con nuestra oficina. Randi lakes de fax es [place] [phone].  Si tiene un asunto urgente cuando la clnica est cerrada y que no puede esperar hasta el siguiente da hbil, puede llamar/localizar a su doctor(a) al nmero que aparece a continuacin.   Por favor, tenga en cuenta que aunque hacemos todo lo posible para estar disponibles para asuntos urgentes fuera del horario de [place], no estamos disponibles las 24 horas del  da, los 7 [address]  [address] de la [place].   Si tiene un problema urgente y no puede comunicarse con nosotros, puede optar por buscar atencin mdica  en el consultorio de su doctor(a), en una clnica privada, en un centro de atencin urgente o en una sala de emergencias.  Si tiene Engineer, drilling, por favor llame inmediatamente al 911 o vaya a la sala de emergencias.  Nmeros de bper  - Dr. Hester: [phone]  - Dra. Jackquline: [phone]  - Dr. Claudene: [phone]   En caso de inclemencias del tiempo, por favor llame a landry capes principal al [phone] para una actualizacin sobre el [place] de cualquier retraso o cierre.  Consejos para la medicacin en dermatologa: Por favor, guarde las cajas en las que vienen los medicamentos de uso tpico para ayudarle a seguir las instrucciones sobre dnde y cmo usarlos. Las farmacias generalmente imprimen las instrucciones del medicamento slo en las cajas y no directamente en los tubos del [place].  Si su medicamento es muy caro, por favor, pngase en contacto con landry rieger llamando al [phone] y presione la opcin 4 o envenos un mensaje a travs de Clinical cytogeneticist.   No podemos decirle cul ser su copago por los medicamentos por adelantado ya que esto es diferente dependiendo de la cobertura de su seguro. Sin embargo, es posible que podamos encontrar un medicamento sustituto a Audiological scientist un formulario para que el seguro cubra el medicamento que se considera necesario.   Si se requiere una autorizacin previa para que su compaa de seguros malta su medicamento, por favor permtanos de 1 a 2 das hbiles para completar este proceso.  Los precios de los medicamentos varan con frecuencia dependiendo del Environmental consultant de dnde se surte la receta y alguna farmacias pueden ofrecer precios ms baratos.  El sitio web www.goodrx.com tiene cupones para medicamentos de Health and safety inspector. Los precios aqu no tienen en cuenta lo que podra  costar con la ayuda del seguro (puede ser ms barato con su seguro), pero el sitio web puede darle el precio si no utiliz Tourist information centre manager.  - Puede imprimir el cupn correspondiente y llevarlo con su receta a la farmacia.  - Tambin puede pasar por nuestra oficina durante el horario de atencin regular y Education officer, museum una tarjeta de cupones de GoodRx.  - Si necesita que su receta se enve electrnicamente a una farmacia diferente, informe a nuestra oficina a travs de MyChart de [place] o por telfono llamando al [phone] y presione la opcin 4.

## 2024-02-10 ENCOUNTER — Other Ambulatory Visit: Payer: Self-pay

## 2024-02-11 ENCOUNTER — Other Ambulatory Visit (INDEPENDENT_AMBULATORY_CARE_PROVIDER_SITE_OTHER)

## 2024-02-11 DIAGNOSIS — N950 Postmenopausal bleeding: Secondary | ICD-10-CM | POA: Diagnosis not present

## 2024-02-11 DIAGNOSIS — R899 Unspecified abnormal finding in specimens from other organs, systems and tissues: Secondary | ICD-10-CM

## 2024-02-11 LAB — CBC WITH DIFFERENTIAL/PLATELET
Basophils Absolute: 0 K/uL (ref 0.0–0.1)
Basophils Relative: 0.4 % (ref 0.0–3.0)
Eosinophils Absolute: 0.1 K/uL (ref 0.0–0.7)
Eosinophils Relative: 1.3 % (ref 0.0–5.0)
HCT: 39 % (ref 36.0–46.0)
Hemoglobin: 12.6 g/dL (ref 12.0–15.0)
Lymphocytes Relative: 16.4 % (ref 12.0–46.0)
Lymphs Abs: 1.6 K/uL (ref 0.7–4.0)
MCHC: 32.3 g/dL (ref 30.0–36.0)
MCV: 83.6 fl (ref 78.0–100.0)
Monocytes Absolute: 0.6 K/uL (ref 0.1–1.0)
Monocytes Relative: 6.3 % (ref 3.0–12.0)
Neutro Abs: 7.6 K/uL (ref 1.4–7.7)
Neutrophils Relative %: 75.6 % (ref 43.0–77.0)
Platelets: 288 K/uL (ref 150.0–400.0)
RBC: 4.67 Mil/uL (ref 3.87–5.11)
RDW: 18.1 % — ABNORMAL HIGH (ref 11.5–15.5)
WBC: 10.1 K/uL (ref 4.0–10.5)

## 2024-02-11 LAB — FOLLICLE STIMULATING HORMONE: FSH: 18.1 m[IU]/mL

## 2024-02-12 ENCOUNTER — Ambulatory Visit: Payer: Self-pay | Admitting: Family

## 2024-02-17 ENCOUNTER — Other Ambulatory Visit: Payer: Self-pay | Admitting: Cardiology

## 2024-02-17 DIAGNOSIS — I480 Paroxysmal atrial fibrillation: Secondary | ICD-10-CM

## 2024-02-18 ENCOUNTER — Other Ambulatory Visit (HOSPITAL_COMMUNITY): Payer: Self-pay

## 2024-02-18 ENCOUNTER — Other Ambulatory Visit: Payer: Self-pay

## 2024-02-18 MED ORDER — APIXABAN 5 MG PO TABS
5.0000 mg | ORAL_TABLET | Freq: Two times a day (BID) | ORAL | 1 refills | Status: AC
  Filled 2024-02-18: qty 180, 90d supply, fill #0

## 2024-02-18 NOTE — Telephone Encounter (Signed)
 Okay to extend course of doxy? Patient is taking PRN for rosacea flares.

## 2024-02-19 ENCOUNTER — Other Ambulatory Visit (HOSPITAL_COMMUNITY): Payer: Self-pay

## 2024-02-19 ENCOUNTER — Other Ambulatory Visit: Payer: Self-pay

## 2024-02-19 MED ORDER — DOXYCYCLINE MONOHYDRATE 100 MG PO CAPS
100.0000 mg | ORAL_CAPSULE | Freq: Two times a day (BID) | ORAL | 2 refills | Status: AC
  Filled 2024-02-19: qty 60, 30d supply, fill #0
  Filled 2024-03-16: qty 120, 60d supply, fill #1

## 2024-02-21 ENCOUNTER — Ambulatory Visit: Admitting: Podiatry

## 2024-02-25 ENCOUNTER — Ambulatory Visit

## 2024-02-25 ENCOUNTER — Other Ambulatory Visit (HOSPITAL_COMMUNITY): Payer: Self-pay

## 2024-02-25 ENCOUNTER — Telehealth (HOSPITAL_COMMUNITY): Payer: Self-pay

## 2024-02-25 ENCOUNTER — Ambulatory Visit: Attending: Cardiology | Admitting: Cardiology

## 2024-02-25 ENCOUNTER — Telehealth: Payer: Self-pay | Admitting: Cardiology

## 2024-02-25 ENCOUNTER — Other Ambulatory Visit: Payer: Self-pay

## 2024-02-25 VITALS — BP 124/72 | HR 91 | Wt >= 6400 oz

## 2024-02-25 DIAGNOSIS — D6869 Other thrombophilia: Secondary | ICD-10-CM | POA: Diagnosis not present

## 2024-02-25 DIAGNOSIS — Z79899 Other long term (current) drug therapy: Secondary | ICD-10-CM | POA: Diagnosis not present

## 2024-02-25 DIAGNOSIS — I480 Paroxysmal atrial fibrillation: Secondary | ICD-10-CM

## 2024-02-25 DIAGNOSIS — I471 Supraventricular tachycardia, unspecified: Secondary | ICD-10-CM | POA: Diagnosis not present

## 2024-02-25 LAB — ECHOCARDIOGRAM COMPLETE
AR max vel: 1.76 cm2
AV Area VTI: 1.71 cm2
AV Area mean vel: 1.63 cm2
AV Mean grad: 7 mmHg
AV Peak grad: 11.7 mmHg
Ao pk vel: 1.71 m/s
S' Lateral: 3 cm
Weight: 7411.2 [oz_av]

## 2024-02-25 MED ORDER — PERFLUTREN LIPID MICROSPHERE
1.0000 mL | INTRAVENOUS | Status: AC | PRN
  Administered 2024-02-25: 1 mL via INTRAVENOUS

## 2024-02-25 MED ORDER — METOPROLOL SUCCINATE ER 25 MG PO TB24
25.0000 mg | ORAL_TABLET | Freq: Every day | ORAL | 3 refills | Status: AC
  Filled 2024-02-25: qty 90, 90d supply, fill #0

## 2024-02-25 NOTE — Progress Notes (Signed)
 Electrophysiology Clinic Note    Date:  02/25/2024  Patient ID:  Belinda Garrett, Belinda Garrett 11-Oct-1970, MRN [id number] PCP:  Dineen Rollene MATSU, FNP  Cardiologist:  None  Electrophysiologist:  OLE ONEIDA HOLTS, MD  Electrophysiology APP:  Tavoris Brisk, NP     Discussed the use of AI scribe software for clinical note transcription with the patient, who gave verbal consent to proceed.   Patient Profile    Chief Complaint: AF follow-up   History of Present Illness: Belinda Garrett is a 53 y.o. female with PMH notable for AFib, SVT, HTN, OSA, morbid obesity; seen today for OLE ONEIDA HOLTS, MD for routine electrophysiology followup.   Historically has low AFib burden. I last saw her 08/2023 where she had uptick in palpitations but had URI, so thought increased burden was d/t URI.   On follow-up today, she has occasional exertional shortness of breath that she feels is unchanged. During a recent period of stress she had about an hour of palpitations with a racing heart that resolved once she calmed down. She does not routinely monitor her heart rate or blood pressure. She is not aware of being in AFib today.  She takes Eliquis  twice daily without missed doses and is on metoprolol , which is due for refill. She denies dizziness, worsening edema, or new shortness of breath and uses daily compression socks for stable leg swelling.  She reports prior transvaginal ultrasound showing cysts related to some vaginal bleeding and occasional minor rectal bleeding. She has a colonoscopy consultation scheduled in February. She denies any significant or concerning bleeding while on Eliquis .      Arrhythmia/Device History No specialty comments available.    ROS:  Please see the history of present illness. All other systems are reviewed and otherwise negative.    Physical Exam    VS:  BP 124/72 (BP Location: Left Arm, Patient Position: Sitting)   Pulse 91   Wt (!) 463 lb 3.2 oz (210.1 kg)    SpO2 96%   BMI 87.52 kg/m  BMI: Body mass index is 87.52 kg/m.           Wt Readings from Last 3 Encounters:  02/25/24 (!) 463 lb 3.2 oz (210.1 kg)  12/31/23 (!) 460 lb (208.7 kg)  09/19/23 (!) 460 lb (208.7 kg)     GEN- The patient is well appearing, alert and oriented x 3 today.   Lungs- diminished throughout, clear to ausculation bilaterally, normal work of breathing.  Heart- Irregularly irregular rate and rhythm, no murmurs, rubs or gallops Extremities- chronic-appearing peripheral edema, warm, dry  Studies Reviewed   Previous EP, cardiology notes.    EKG is ordered. Personal review of EKG from today shows:    EKG Interpretation Date/Time:  Monday February 25 2024 13:31:43 EST Ventricular Rate:  91 PR Interval:    QRS Duration:  88 QT Interval:  378 QTC Calculation: 464 R Axis:   -24  Text Interpretation: Atrial fibrillation with premature ventricular or aberrantly conducted complexes Low voltage QRS Confirmed by Tyshika Baldridge [phone]) on 02/25/2024 1:34:35 PM    Long term monitor, 06/15/2021 HR 54 - 203 bpm, average 82 bpm. 36 SVT, longest 15 beats at an average rate of 120 bpm Symptom triggered episodes correspond to SVT. Rare supraventricular and ventricular ectopy. No sustained arrhythmias.     Assessment and Plan     #) parox AFib #) SVT Presents in aFib, asymptomatic She is not aware of whether she is having  paroxysmal episodes or persistently in afib Update 1 week zio to further eval Encouraged her to monitor her heart rate/rhythm via BP cuff Continue 25mg  toprol  daily Update TTE Update BMP, mag, thyroid  labs today She is not invasive EP procedural candidate d/t elevated BMI  #) Hypercoag d/t afib CHA2DS2-VASc Score = at least 2 [CHF History: 0, HTN History: 1, Diabetes History: 0, Stroke History: 0, Vascular Disease History: 0, Age Score: 0, Gender Score: 1].  Therefore, the patient's annual risk of stroke is 2.2 %.    Stroke ppx - 5mg  eliquis   BID, appropriately dosed No significant bleeding concerns Recent CBC stable    Current medicines are reviewed at length with the patient today.   The patient does not have concerns regarding her medicines.  The following changes were made today:  none  Labs/ tests ordered today include:  Orders Placed This Encounter  Procedures   Basic metabolic panel with GFR   Magnesium   T4, free   TSH   EKG 12-Lead   ECHOCARDIOGRAM COMPLETE     Disposition: Follow up with Dr. Cindie or EP APP in 2 weeks   Signed, Chaniah Cisse, NP  02/25/24  2:02 PM  Electrophysiology [facility]

## 2024-02-25 NOTE — H&P (View-Only) (Signed)
 Electrophysiology Clinic Note    Date:  02/25/2024  Patient ID:  Belinda, Garrett 11-Oct-1970, MRN [id number] PCP:  Dineen Rollene MATSU, FNP  Cardiologist:  None  Electrophysiologist:  OLE ONEIDA HOLTS, MD  Electrophysiology APP:  Tavoris Brisk, NP     Discussed the use of AI scribe software for clinical note transcription with the patient, who gave verbal consent to proceed.   Patient Profile    Chief Complaint: AF follow-up   History of Present Illness: Belinda Garrett is a 53 y.o. female with PMH notable for AFib, SVT, HTN, OSA, morbid obesity; seen today for OLE ONEIDA HOLTS, MD for routine electrophysiology followup.   Historically has low AFib burden. I last saw her 08/2023 where she had uptick in palpitations but had URI, so thought increased burden was d/t URI.   On follow-up today, she has occasional exertional shortness of breath that she feels is unchanged. During a recent period of stress she had about an hour of palpitations with a racing heart that resolved once she calmed down. She does not routinely monitor her heart rate or blood pressure. She is not aware of being in AFib today.  She takes Eliquis  twice daily without missed doses and is on metoprolol , which is due for refill. She denies dizziness, worsening edema, or new shortness of breath and uses daily compression socks for stable leg swelling.  She reports prior transvaginal ultrasound showing cysts related to some vaginal bleeding and occasional minor rectal bleeding. She has a colonoscopy consultation scheduled in February. She denies any significant or concerning bleeding while on Eliquis .      Arrhythmia/Device History No specialty comments available.    ROS:  Please see the history of present illness. All other systems are reviewed and otherwise negative.    Physical Exam    VS:  BP 124/72 (BP Location: Left Arm, Patient Position: Sitting)   Pulse 91   Wt (!) 463 lb 3.2 oz (210.1 kg)    SpO2 96%   BMI 87.52 kg/m  BMI: Body mass index is 87.52 kg/m.           Wt Readings from Last 3 Encounters:  02/25/24 (!) 463 lb 3.2 oz (210.1 kg)  12/31/23 (!) 460 lb (208.7 kg)  09/19/23 (!) 460 lb (208.7 kg)     GEN- The patient is well appearing, alert and oriented x 3 today.   Lungs- diminished throughout, clear to ausculation bilaterally, normal work of breathing.  Heart- Irregularly irregular rate and rhythm, no murmurs, rubs or gallops Extremities- chronic-appearing peripheral edema, warm, dry  Studies Reviewed   Previous EP, cardiology notes.    EKG is ordered. Personal review of EKG from today shows:    EKG Interpretation Date/Time:  Monday February 25 2024 13:31:43 EST Ventricular Rate:  91 PR Interval:    QRS Duration:  88 QT Interval:  378 QTC Calculation: 464 R Axis:   -24  Text Interpretation: Atrial fibrillation with premature ventricular or aberrantly conducted complexes Low voltage QRS Confirmed by Tyshika Baldridge [phone]) on 02/25/2024 1:34:35 PM    Long term monitor, 06/15/2021 HR 54 - 203 bpm, average 82 bpm. 36 SVT, longest 15 beats at an average rate of 120 bpm Symptom triggered episodes correspond to SVT. Rare supraventricular and ventricular ectopy. No sustained arrhythmias.     Assessment and Plan     #) parox AFib #) SVT Presents in aFib, asymptomatic She is not aware of whether she is having  paroxysmal episodes or persistently in afib Update 1 week zio to further eval Encouraged her to monitor her heart rate/rhythm via BP cuff Continue 25mg  toprol  daily Update TTE Update BMP, mag, thyroid  labs today She is not invasive EP procedural candidate d/t elevated BMI  #) Hypercoag d/t afib CHA2DS2-VASc Score = at least 2 [CHF History: 0, HTN History: 1, Diabetes History: 0, Stroke History: 0, Vascular Disease History: 0, Age Score: 0, Gender Score: 1].  Therefore, the patient's annual risk of stroke is 2.2 %.    Stroke ppx - 5mg  eliquis   BID, appropriately dosed No significant bleeding concerns Recent CBC stable    Current medicines are reviewed at length with the patient today.   The patient does not have concerns regarding her medicines.  The following changes were made today:  none  Labs/ tests ordered today include:  Orders Placed This Encounter  Procedures   Basic metabolic panel with GFR   Magnesium   T4, free   TSH   EKG 12-Lead   ECHOCARDIOGRAM COMPLETE     Disposition: Follow up with Dr. Cindie or EP APP in 2 weeks   Signed, Chaniah Cisse, NP  02/25/24  2:02 PM  Electrophysiology [facility]

## 2024-02-25 NOTE — Telephone Encounter (Signed)
 Pharmacy Patient Advocate Encounter  Insurance verification completed.    The patient is insured through [facility]. Patient has [employer], may use a copay card, and/or apply for patient assistance if available.    Ran test claim for Multaq 400mg  tablet and the current 30 day co-pay is $125.   This test claim was processed through [facility]- copay amounts may vary at other pharmacies due to pharmacy/plan contracts, or as the patient moves through the different stages of their insurance plan.

## 2024-02-25 NOTE — Patient Instructions (Signed)
 Medication Instructions:  Your physician recommends that you continue on your current medications as directed. Please refer to the Current Medication list given to you today.   *If you need a refill on your cardiac medications before your next appointment, please call your pharmacy*  Lab Work: BMP, TSH MAG, T4 If you have labs (blood work) drawn today and your tests are completely normal, you will receive your results only by: MyChart Message (if you have MyChart) OR A paper copy in the mail If you have any lab test that is abnormal or we need to change your treatment, we will call you to review the results.  Testing/Procedures: Your physician has requested that you have an echocardiogram. Echocardiography is a painless test that uses sound waves to create images of your heart. It provides your doctor with information about the size and shape of your heart and how well your heart's chambers and valves are working.   You may receive an ultrasound enhancing agent through an IV if needed to better visualize your heart during the echo. This procedure takes approximately one hour.  There are no restrictions for this procedure.  This will take place at [address] [facility] [address], [place] [address]  Please note: We ask at that you not bring children with you during ultrasound (echo/ vascular) testing. Due to room size and safety concerns, children are not allowed in the ultrasound rooms during exams. Our front office staff cannot provide observation of children in our lobby area while testing is being conducted. An adult accompanying a patient to their appointment will only be allowed in the ultrasound room at the discretion of the ultrasound technician under special circumstances. We apologize for any inconvenience.    ZIO XT- Long Term Monitor Instructions  Your physician has requested you wear a ZIO patch monitor for 7 days.  This is a single patch monitor. [employer] supplies  one patch monitor per enrollment. Additional stickers are not available. Please do not apply patch if you will be having a Nuclear Stress Test,  Echocardiogram, Cardiac CT, MRI, or Chest Xray during the period you would be wearing the  monitor. The patch cannot be worn during these tests. You cannot remove and re-apply the  ZIO XT patch monitor.  Your ZIO patch monitor will be mailed 3 day USPS to your address on file. It may take 3-5 days  to receive your monitor after you have been enrolled.  Once you have received your monitor, please review the enclosed instructions. Your monitor  has already been registered assigning a specific monitor serial # to you.  Billing and Patient Assistance Program Information  We have supplied [employer] with any of your insurance information on file for billing purposes. [employer] offers a sliding scale Patient Assistance Program for patients that do not have  insurance, or whose insurance does not completely cover the cost of the ZIO monitor.  You must apply for the Patient Assistance Program to qualify for this discounted rate.  To apply, please call [employer] at [phone], select option 4, select option 2, ask to apply for  Patient Assistance Program. Meredeth will ask your household income, and how many people  are in your household. They will quote your out-of-pocket cost based on that information.  [employer] will also be able to set up a 81-month, interest-free payment plan if needed.  Applying the monitor   Shave hair from upper left chest.  Hold abrader disc by orange tab. Rub abrader in  40 strokes over the upper left chest as  indicated in your monitor instructions.  Clean area with 4 enclosed alcohol pads. Let dry.  Apply patch as indicated in monitor instructions. Patch will be placed under collarbone on left  side of chest with arrow pointing upward.  Rub patch adhesive wings for 2 minutes. Remove white label marked 1. Remove the white  label  marked 2. Rub patch adhesive wings for 2 additional minutes.  While looking in a mirror, press and release button in center of patch. A small green light will  flash 3-4 times. This will be your only indicator that the monitor has been turned on.  Do not shower for the first 24 hours. You may shower after the first 24 hours.  Press the button if you feel a symptom. You will hear a small click. Record Date, Time and  Symptom in the Patient Logbook.  When you are ready to remove the patch, follow instructions on the last 2 pages of Patient  Logbook. Stick patch monitor onto the last page of Patient Logbook.  Place Patient Logbook in the blue and white box. Use locking tab on box and tape box closed  securely. The blue and white box has prepaid postage on it. Please place it in the mailbox as  soon as possible. Your physician should have your test results approximately 7 days after the  monitor has been mailed back to [facility].  Call [facility] Customer Care at [phone] if you have questions regarding  your ZIO XT patch monitor. Call them immediately if you see an orange light blinking on your  monitor.  If your monitor falls off in less than 4 days, contact our Monitor department at [phone].  If your monitor becomes loose or falls off after 4 days call [employer] at [phone] for  suggestions on securing your monitor   Follow-Up: At [facility], you and your health needs are our priority.  As part of our continuing mission to provide you with exceptional heart care, our providers are all part of one team.  This team includes your primary Cardiologist (physician) and Advanced Practice Providers or APPs (Physician Assistants and Nurse Practitioners) who all work together to provide you with the care you need, when you need it.  Your next appointment:   3 week(s)  Provider:   Suzann Riddle, NP

## 2024-02-25 NOTE — Telephone Encounter (Signed)
  Patient Consent for Virtual Visit        Belinda Garrett has provided verbal consent on 02/25/2024 for a virtual visit (video or telephone).   CONSENT FOR VIRTUAL VISIT FOR:  Belinda Garrett  By participating in this virtual visit I agree to the following:  I hereby voluntarily request, consent and authorize [facility] and its employed or contracted physicians, physician assistants, nurse practitioners or other licensed health care professionals (the Practitioner), to provide me with telemedicine health care services (the "Services) as deemed necessary by the treating Practitioner. I acknowledge and consent to receive the Services by the Practitioner via telemedicine. I understand that the telemedicine visit will involve communicating with the Practitioner through live audiovisual communication technology and the disclosure of certain medical information by electronic transmission. I acknowledge that I have been given the opportunity to request an in-person assessment or other available alternative prior to the telemedicine visit and am voluntarily participating in the telemedicine visit.  I understand that I have the right to withhold or withdraw my consent to the use of telemedicine in the course of my care at any time, without affecting my right to future care or treatment, and that the Practitioner or I may terminate the telemedicine visit at any time. I understand that I have the right to inspect all information obtained and/or recorded in the course of the telemedicine visit and may receive copies of available information for a reasonable fee.  I understand that some of the potential risks of receiving the Services via telemedicine include:  Delay or interruption in medical evaluation due to technological equipment failure or disruption; Information transmitted may not be sufficient (e.g. poor resolution of images) to allow for appropriate medical decision making by the  Practitioner; and/or  In rare instances, security protocols could fail, causing a breach of personal health information.  Furthermore, I acknowledge that it is my responsibility to provide information about my medical history, conditions and care that is complete and accurate to the best of my ability. I acknowledge that Practitioner's advice, recommendations, and/or decision may be based on factors not within their control, such as incomplete or inaccurate data provided by me or distortions of diagnostic images or specimens that may result from electronic transmissions. I understand that the practice of medicine is not an exact science and that Practitioner makes no warranties or guarantees regarding treatment outcomes. I acknowledge that a copy of this consent can be made available to me via my patient portal [facility] MyChart), or I can request a printed copy by calling the office of [facility].    I understand that my insurance will be billed for this visit.   I have read or had this consent read to me. I understand the contents of this consent, which adequately explains the benefits and risks of the Services being provided via telemedicine.  I have been provided ample opportunity to ask questions regarding this consent and the Services and have had my questions answered to my satisfaction. I give my informed consent for the services to be provided through the use of telemedicine in my medical care

## 2024-02-26 ENCOUNTER — Ambulatory Visit: Payer: Self-pay | Admitting: Cardiology

## 2024-02-26 DIAGNOSIS — R200 Anesthesia of skin: Secondary | ICD-10-CM | POA: Diagnosis not present

## 2024-02-26 LAB — BASIC METABOLIC PANEL WITH GFR
BUN/Creatinine Ratio: 17 (ref 9–23)
BUN: 15 mg/dL (ref 6–24)
CO2: 24 mmol/L (ref 20–29)
Calcium: 9.2 mg/dL (ref 8.7–10.2)
Chloride: 102 mmol/L (ref 96–106)
Creatinine, Ser: 0.86 mg/dL (ref 0.57–1.00)
Glucose: 96 mg/dL (ref 70–99)
Potassium: 4.4 mmol/L (ref 3.5–5.2)
Sodium: 138 mmol/L (ref 134–144)
eGFR: 80 mL/min/1.73 (ref >59)

## 2024-02-26 LAB — MAGNESIUM: Magnesium: 2.1 mg/dL (ref 1.6–2.3)

## 2024-02-26 LAB — T4, FREE: Free T4: 1.56 ng/dL (ref 0.82–1.77)

## 2024-02-26 LAB — TSH: TSH: 1.21 u[IU]/mL (ref 0.450–4.500)

## 2024-03-02 ENCOUNTER — Other Ambulatory Visit: Payer: Self-pay

## 2024-03-02 ENCOUNTER — Encounter: Payer: Self-pay | Admitting: Family

## 2024-03-03 ENCOUNTER — Other Ambulatory Visit: Payer: Self-pay

## 2024-03-03 ENCOUNTER — Telehealth: Payer: Self-pay

## 2024-03-03 ENCOUNTER — Other Ambulatory Visit (HOSPITAL_COMMUNITY): Payer: Self-pay

## 2024-03-03 DIAGNOSIS — I10 Essential (primary) hypertension: Secondary | ICD-10-CM

## 2024-03-03 MED ORDER — HYDROCHLOROTHIAZIDE 25 MG PO TABS
25.0000 mg | ORAL_TABLET | Freq: Every day | ORAL | 3 refills | Status: DC
  Filled 2024-03-03: qty 90, 90d supply, fill #0

## 2024-03-03 MED ORDER — LISINOPRIL 30 MG PO TABS
30.0000 mg | ORAL_TABLET | Freq: Every day | ORAL | 3 refills | Status: AC
  Filled 2024-03-03: qty 90, 90d supply, fill #0

## 2024-03-03 NOTE — Addendum Note (Signed)
 Addended by: Srinivas Lippman on: 03/03/2024 03:19 PM   Modules accepted: Orders

## 2024-03-03 NOTE — Telephone Encounter (Signed)
 Pharmacy Patient Advocate Encounter   Received notification from Physician's Office that prior authorization for Lisinopril  30 mg tablets is required/requested.   Insurance verification completed.   The patient is insured through [facility].   Per test claim: The current 90 day co-pay is, $0.  No PA needed at this time. This test claim was processed through [employer]- copay amounts may vary at other pharmacies due to pharmacy/plan contracts, or as the patient moves through the different stages of their insurance plan.

## 2024-03-03 NOTE — Telephone Encounter (Signed)
 Pharmacy Patient Advocate Encounter   Received notification from Physician's Office that prior authorization for Hydrochlorothiazide  25 mg tablets is required/requested.   Insurance verification completed.   The patient is insured through [employer] [facility]   Per test claim: The current 90 day co-pay is, $0.  No PA needed at this time. This test claim was processed through [facility]- copay amounts may vary at other pharmacies due to pharmacy/plan contracts, or as the patient moves through the different stages of their insurance plan.

## 2024-03-03 NOTE — Telephone Encounter (Signed)
 Rx has been sent in to pharmacy pt has been notified

## 2024-03-04 ENCOUNTER — Other Ambulatory Visit: Payer: Self-pay

## 2024-03-04 ENCOUNTER — Ambulatory Visit: Admitting: Obstetrics

## 2024-03-11 DIAGNOSIS — B351 Tinea unguium: Secondary | ICD-10-CM | POA: Diagnosis not present

## 2024-03-11 DIAGNOSIS — G8929 Other chronic pain: Secondary | ICD-10-CM | POA: Diagnosis not present

## 2024-03-11 DIAGNOSIS — M79674 Pain in right toe(s): Secondary | ICD-10-CM | POA: Diagnosis not present

## 2024-03-11 DIAGNOSIS — M79675 Pain in left toe(s): Secondary | ICD-10-CM | POA: Diagnosis not present

## 2024-03-12 ENCOUNTER — Other Ambulatory Visit (HOSPITAL_COMMUNITY): Payer: Self-pay

## 2024-03-14 ENCOUNTER — Other Ambulatory Visit (HOSPITAL_COMMUNITY): Payer: Self-pay

## 2024-03-14 DIAGNOSIS — I480 Paroxysmal atrial fibrillation: Secondary | ICD-10-CM | POA: Diagnosis not present

## 2024-03-16 ENCOUNTER — Encounter: Payer: Self-pay | Admitting: Family

## 2024-03-16 ENCOUNTER — Other Ambulatory Visit (HOSPITAL_COMMUNITY): Payer: Self-pay

## 2024-03-17 ENCOUNTER — Other Ambulatory Visit: Payer: Self-pay | Admitting: Family

## 2024-03-17 ENCOUNTER — Other Ambulatory Visit (HOSPITAL_COMMUNITY): Payer: Self-pay

## 2024-03-17 ENCOUNTER — Other Ambulatory Visit: Payer: Self-pay

## 2024-03-17 DIAGNOSIS — F39 Unspecified mood [affective] disorder: Secondary | ICD-10-CM

## 2024-03-17 MED ORDER — FLUOXETINE HCL 20 MG PO CAPS
60.0000 mg | ORAL_CAPSULE | Freq: Every day | ORAL | 3 refills | Status: DC
  Filled 2024-03-17: qty 270, 90d supply, fill #0

## 2024-03-17 NOTE — Telephone Encounter (Signed)
 Noted! Thank you

## 2024-03-17 NOTE — Progress Notes (Unsigned)
 {Choose 1 Note Type (Video or Telephone):[phone]}    Date:  03/17/2024   ID:  CATHLINE DOWEN, DOB January 20, 1971, MRN [id number] The patient was identified using 2 identifiers.  {Patient Location:[phone]::Home} {Provider [phone]::Home Office}   PCP:  Dineen Rollene MATSU, FNP   [facility] Providers Cardiologist:  None Electrophysiologist:  OLE ONEIDA HOLTS, MD  Electrophysiology APP:  Grabiela Wohlford, NP { Click to update primary MD,subspecialty MD or APP then REFRESH:1}    Evaluation Performed:  Follow-Up Visit  Chief Complaint:  AFib  History of Present Illness:    Belinda Garrett is a 53 y.o. female with  PMH notable for AFib, SVT, HTN, OSA, morbid obesity; seen today for follow-up after our visit earlier this month. During visit, she was in asymptomatic AFib, unsure if parox or persistent. Updated TTE with normal LVEF. 1 week zio monitor with 100% AF burden.   *** AF burden, symptoms *** palpitations *** bleeding concerns      Past Medical History:  Diagnosis Date   Abnormal menses    Allergy    Anxiety    Arthritis    Back pain    Back pain    Brain mass 08/09/2020   Chicken pox    Depression    Hypertension    Joint pain    Joint pain    Lymphedema    Palpitations    Peripheral neuropathy 09/30/2018   Pre-diabetes    Prediabetes    Rosacea    Seasonal allergies    Sleep apnea    Stasis dermatitis of both legs    Vertigo    Past Surgical History:  Procedure Laterality Date   APPLICATION OF CRANIAL NAVIGATION N/A 09/28/2020   Procedure: APPLICATION OF CRANIAL NAVIGATION;  Surgeon: Cheryle Debby LABOR, MD;  Location: [facility] OR;  Service: Neurosurgery;  Laterality: N/A;   CARDIOVERSION N/A 01/28/2021   Procedure: CARDIOVERSION;  Surgeon: Perla Evalene PARAS, MD;  Location: [facility] ORS;  Service: Cardiovascular;  Laterality: N/A;   CRANIOTOMY Left 09/28/2020   Procedure: Left Occipital craniotomy for tumor resection;  Surgeon:  Cheryle Debby LABOR, MD;  Location: [facility] OR;  Service: Neurosurgery;  Laterality: Left;  RM 20   WISDOM TOOTH EXTRACTION       Active Medications[1]   Allergies:   Dust mite extract, Latex, and Pollen extract   Social History[2]   Family Hx: The patient's family history includes Alcohol abuse in her maternal grandfather; Arthritis in her maternal grandfather, maternal grandmother, mother, paternal grandfather, and paternal grandmother; Dementia (age of onset: 50) in her father; Depression in her father, maternal grandfather, mother, and paternal grandfather; Diabetes in her paternal aunt; Healthy in her brother; Hearing loss in her maternal grandfather; Heart disease in her maternal grandfather, maternal grandmother, and paternal grandfather; Hyperlipidemia in her maternal grandmother; Hypertension in her maternal grandmother, mother, and paternal grandfather; Miscarriages / Stillbirths in her paternal grandmother; Obesity in her father and mother; Parkinson's disease (age of onset: 57) in her father; Stroke in her maternal grandfather, maternal grandmother, and paternal grandfather; Thyroid  disease in her mother. There is no history of Breast cancer or Colon cancer.  ROS:   Please see the history of present illness.    *** All other systems reviewed and are negative.   Prior CV studies:   The following studies were reviewed today:  12/26 unofficial zio - 100% AF; HR range from 54-168 (average 85)  02/25/2024 TTE - normal LVEF. NL atrial sizes  Labs/Other Tests and Data  Reviewed:    EKG:  {EKG/Telemetry Strips Reviewed:[phone]}  Recent Labs: 02/11/2024: Hemoglobin 12.6; Platelets 288.0 02/25/2024: BUN 15; Creatinine, Ser 0.86; Magnesium 2.1; Potassium 4.4; Sodium 138; TSH 1.210   Recent Lipid Panel Lab Results  Component Value Date/Time   CHOL 185 09/06/2023 04:12 PM   CHOL 180 04/29/2020 10:15 AM   TRIG 99.0 09/06/2023 04:12 PM   HDL 45.10 09/06/2023 04:12 PM   HDL 38 (L)  04/29/2020 10:15 AM   CHOLHDL 4 09/06/2023 04:12 PM   LDLCALC 120 (H) 09/06/2023 04:12 PM   LDLCALC 122 (H) 04/29/2020 10:15 AM    Wt Readings from Last 3 Encounters:  02/25/24 (!) 463 lb 3.2 oz (210.1 kg)  12/31/23 (!) 460 lb (208.7 kg)  09/19/23 (!) 460 lb (208.7 kg)     Risk Assessment/Calculations:    CHA2DS2-VASc Score = 2  {Confirm score is correct.  If not, click here to update score.  REFRESH note.  :1} This indicates a 2.2% annual risk of stroke. The patient's score is based upon: CHF History: 0 HTN History: 1 Diabetes History: 0 Stroke History: 0 Vascular Disease History: 0 Age Score: 0 Gender Score: 1          Objective:    Vital Signs:  There were no vitals taken for this visit.   {HeartCare Virtual Exam (Optional):[id number]::VITAL SIGNS:  reviewed}  ASSESSMENT & PLAN:    #) Persis AFib   #) Hypercoag d/t *** afib CHA2DS2-VASc Score = at least 2 [CHF History: 0, HTN History: 1, Diabetes History: 0, Stroke History: 0, Vascular Disease History: 0, Age Score: 0, Gender Score: 1].  Therefore, the patient's annual risk of stroke is 2.2 %.     {Confirm score is correct.  If not, click here to update score.  REFRESH note.  :1}   Stroke ppx - ***, appropriately dosed No bleeding concerns       {Are you ordering a CV Procedure (e.g. stress test, cath, DCCV, TEE, etc)?   Press F2        :[id number]}     Time:   Today, I have spent *** minutes with the patient with telehealth technology discussing the above problems.     Medication Adjustments/Labs and Tests Ordered: Current medicines are reviewed at length with the patient today.  Concerns regarding medicines are outlined above.   Tests Ordered: No orders of the defined types were placed in this encounter.   Medication Changes: No orders of the defined types were placed in this encounter.   Follow Up:  {F/U Format:[phone]} {follow up:15908}  Signed, Quincy Prisco, NP  03/17/2024 4:57 PM     [facility]     [1]  No outpatient medications have been marked as taking for the 03/18/24 encounter (Appointment) with Jocilynn Grade, NP.  [2]  Social History Tobacco Use   Smoking status: Never   Smokeless tobacco: Never  Vaping Use   Vaping status: Never Used  Substance Use Topics   Alcohol use: Not Currently   Drug use: Never

## 2024-03-18 ENCOUNTER — Other Ambulatory Visit: Payer: Self-pay

## 2024-03-18 ENCOUNTER — Ambulatory Visit: Attending: Cardiology | Admitting: Cardiology

## 2024-03-18 DIAGNOSIS — Z7901 Long term (current) use of anticoagulants: Secondary | ICD-10-CM

## 2024-03-18 DIAGNOSIS — I4819 Other persistent atrial fibrillation: Secondary | ICD-10-CM | POA: Diagnosis not present

## 2024-03-18 NOTE — Patient Instructions (Signed)
 Medication Instructions:  Your physician recommends that you continue on your current medications as directed. Please refer to the Current Medication list given to you today.  *If you need a refill on your cardiac medications before your next appointment, please call your pharmacy*  Lab Work: Your provider would like for you to have following labs drawn today CBC.     Testing/Procedures:     Dear Belinda Garrett  You are scheduled for a Cardioversion on Friday, January 2 with Dr. Gollan.  Please arrive at the [facility] Entrance of [facility], [address], [place] [address] at 6:30 AM (This is one hour(s) prior to your procedure time).  Proceed to the Check-In Desk directly inside the entrance.  Procedure Parking: Use the entrance off of the [address] side of the hospital. Turn right upon entering and follow the driveway to parking that is directly in front of the Heart & Vascular Center. There is no valet parking available at this entrance, however there is an awning directly in front of the Heart & Vascular Center for drop off/ pick up for patients.    DIET:  Nothing to eat or drink after midnight except a sip of water with medications (see medication instructions below)  MEDICATION INSTRUCTIONS: !!IF ANY NEW MEDICATIONS ARE STARTED AFTER TODAY, PLEASE NOTIFY YOUR PROVIDER AS SOON AS POSSIBLE!!   Continue taking your anticoagulant (blood thinner): Apixaban  (Eliquis ).  You will need to continue this after your procedure until you are told by your provider that it is safe to stop.     FYI:  For your safety, and to allow us  to monitor your vital signs accurately during the surgery/procedure we request: If you have artificial nails, gel coating, SNS etc, please have those removed prior to your surgery/procedure. Not having the nail coverings /polish removed may result in cancellation or delay of your surgery/procedure.  Your support person will be asked to wait in the  waiting room during your procedure.  It is OK to have someone drop you off and come back when you are ready to be discharged.  You cannot drive after the procedure and will need someone to drive you home.  Bring your insurance cards.  *Special Note: Every effort is made to have your procedure done on time. Occasionally there are emergencies that occur at the hospital that may cause delays. Please be patient if a delay does occur.      Follow-Up: At [facility], you and your health needs are our priority.  As part of our continuing mission to provide you with exceptional heart care, our providers are all part of one team.  This team includes your primary Cardiologist (physician) and Advanced Practice Providers or APPs (Physician Assistants and Nurse Practitioners) who all work together to provide you with the care you need, when you need it.  Your next appointment:   4-6 weeks   Provider:   Suzann Riddle, NP

## 2024-03-18 NOTE — Telephone Encounter (Signed)
 I spoke with Belinda Garrett in regards to her Cardioversion being scheduled. Patient had a video visit with Suzann Riddle, NP today (03/18/2024). Instructions and appointment information given over the phone. After Visit Summary has all of the Cardioversion instructions in My Chart also.

## 2024-03-19 ENCOUNTER — Telehealth: Payer: Self-pay

## 2024-03-19 DIAGNOSIS — I480 Paroxysmal atrial fibrillation: Secondary | ICD-10-CM

## 2024-03-19 DIAGNOSIS — I4819 Other persistent atrial fibrillation: Secondary | ICD-10-CM

## 2024-03-19 NOTE — Telephone Encounter (Signed)
 I spoke with Ms Galgano with updated instructions and appointment information for the Cardioversion. Patient is aware to hold metformin  the morning of the procedure, restart after the procedure and to continue eliquis  with no interruptions. Follow up appointment for one month post cardioversion made with Suzann Riddle, NP. New instruction sheet sent via My Chart Message. New procedure is at [facility] on Monday Jan 5th, 2026. Arrival time is 10:00 am for a 12:00 procedure. Case number is [id number]. Suzann Riddle aware of appointment change.

## 2024-03-20 LAB — CBC
Hematocrit: 39.7 % (ref 34.0–46.6)
Hemoglobin: 12.3 g/dL (ref 11.1–15.9)
MCH: 25.3 pg — ABNORMAL LOW (ref 26.6–33.0)
MCHC: 31 g/dL — ABNORMAL LOW (ref 31.5–35.7)
MCV: 82 fL (ref 79–97)
Platelets: 265 x10E3/uL (ref 150–450)
RBC: 4.86 x10E6/uL (ref 3.77–5.28)
RDW: 15 % (ref 11.7–15.4)
WBC: 10.7 x10E3/uL (ref 3.4–10.8)

## 2024-03-21 ENCOUNTER — Encounter: Payer: Self-pay | Admitting: Anesthesiology

## 2024-03-21 ENCOUNTER — Ambulatory Visit: Admission: RE | Admit: 2024-03-21 | Source: Home / Self Care | Admitting: Cardiovascular Disease

## 2024-03-21 ENCOUNTER — Encounter: Admission: RE | Payer: Self-pay | Source: Home / Self Care

## 2024-03-21 ENCOUNTER — Ambulatory Visit: Payer: Self-pay | Admitting: Cardiology

## 2024-03-21 DIAGNOSIS — I480 Paroxysmal atrial fibrillation: Secondary | ICD-10-CM

## 2024-03-21 SURGERY — CARDIOVERSION
Anesthesia: General

## 2024-03-21 NOTE — Progress Notes (Signed)
 Pt called for pre procedure instructions.   Arrival time 1015 NPO after midnight explained Instructed to take am meds with sip of water and confirmed blood thinner consistency Instructed pt need for ride home tomorrow and have responsible adult with them for 24 hrs post procedure.

## 2024-03-24 ENCOUNTER — Encounter (HOSPITAL_COMMUNITY): Payer: Self-pay | Admitting: Internal Medicine

## 2024-03-24 ENCOUNTER — Encounter (HOSPITAL_COMMUNITY): Admission: RE | Disposition: A | Payer: Self-pay | Source: Home / Self Care | Attending: Internal Medicine

## 2024-03-24 ENCOUNTER — Ambulatory Visit (HOSPITAL_COMMUNITY): Admitting: Anesthesiology

## 2024-03-24 ENCOUNTER — Ambulatory Visit (HOSPITAL_COMMUNITY)
Admission: RE | Admit: 2024-03-24 | Discharge: 2024-03-24 | Disposition: A | Discharge status: Home / Self Care | Attending: Internal Medicine | Admitting: Internal Medicine

## 2024-03-24 DIAGNOSIS — E785 Hyperlipidemia, unspecified: Secondary | ICD-10-CM | POA: Diagnosis not present

## 2024-03-24 DIAGNOSIS — I471 Supraventricular tachycardia, unspecified: Secondary | ICD-10-CM | POA: Insufficient documentation

## 2024-03-24 DIAGNOSIS — G4733 Obstructive sleep apnea (adult) (pediatric): Secondary | ICD-10-CM | POA: Insufficient documentation

## 2024-03-24 DIAGNOSIS — I4891 Unspecified atrial fibrillation: Secondary | ICD-10-CM | POA: Diagnosis not present

## 2024-03-24 DIAGNOSIS — Z7901 Long term (current) use of anticoagulants: Secondary | ICD-10-CM | POA: Diagnosis not present

## 2024-03-24 DIAGNOSIS — I480 Paroxysmal atrial fibrillation: Secondary | ICD-10-CM

## 2024-03-24 DIAGNOSIS — I10 Essential (primary) hypertension: Secondary | ICD-10-CM | POA: Insufficient documentation

## 2024-03-24 DIAGNOSIS — F418 Other specified anxiety disorders: Secondary | ICD-10-CM | POA: Diagnosis not present

## 2024-03-24 DIAGNOSIS — R7303 Prediabetes: Secondary | ICD-10-CM | POA: Insufficient documentation

## 2024-03-24 HISTORY — PX: CARDIOVERSION: EP1203

## 2024-03-24 MED ORDER — PROPOFOL 10 MG/ML IV BOLUS
INTRAVENOUS | Status: DC | PRN
  Administered 2024-03-24: 100 mg via INTRAVENOUS
  Administered 2024-03-24: 50 mg via INTRAVENOUS

## 2024-03-24 NOTE — Anesthesia Preprocedure Evaluation (Addendum)
"                                    Anesthesia Evaluation  Patient identified by MRN, date of birth, ID band Patient awake    Reviewed: Allergy & Precautions, NPO status , Patient's Chart, lab work & pertinent test results  Airway Mallampati: III       Dental no notable dental hx.    Pulmonary sleep apnea and Continuous Positive Airway Pressure Ventilation    Pulmonary exam normal        Cardiovascular hypertension, Pt. on home beta blockers Normal cardiovascular exam+ dysrhythmias Atrial Fibrillation      Neuro/Psych  PSYCHIATRIC DISORDERS Anxiety Depression     Neuromuscular disease    GI/Hepatic   Endo/Other    Class 4 obesityPre-DM  Renal/GU      Musculoskeletal   Abdominal   Peds  Hematology  (+) Blood dyscrasia (Eliquis )   Anesthesia Other Findings A-FIB  Reproductive/Obstetrics                              Anesthesia Physical Anesthesia Plan  ASA: 4  Anesthesia Plan: General   Post-op Pain Management:    Induction:   PONV Risk Score and Plan: 2 and Propofol  infusion and Treatment may vary due to age or medical condition  Airway Management Planned: Nasal Cannula  Additional Equipment:   Intra-op Plan:   Post-operative Plan:   Informed Consent: I have reviewed the patients History and Physical, chart, labs and discussed the procedure including the risks, benefits and alternatives for the proposed anesthesia with the patient or authorized representative who has indicated his/her understanding and acceptance.     Dental advisory given  Plan Discussed with: CRNA  Anesthesia Plan Comments:          Anesthesia Quick Evaluation  "

## 2024-03-24 NOTE — Transfer of Care (Signed)
 Immediate Anesthesia Transfer of Care Note  Patient: Belinda Garrett  Procedure(s) Performed: CARDIOVERSION  Patient Location: Cath Lab  Anesthesia Type:General  Level of Consciousness: awake, alert , and oriented  Airway & Oxygen Therapy: Patient Spontanous Breathing and Patient connected to nasal cannula oxygen  Post-op Assessment: Report given to RN and Post -op Vital signs reviewed and stable  Post vital signs: Reviewed and stable  Last Vitals:  Vitals Value Taken Time  BP    Temp    Pulse    Resp    SpO2      Last Pain:  Vitals:   03/24/24 1031  TempSrc:   PainSc: 2          Complications: There were no known notable events for this encounter.

## 2024-03-24 NOTE — Interval H&P Note (Signed)
 History and Physical Interval Note:  03/24/2024 10:26 AM  Belinda Garrett  has presented today for surgery, with the diagnosis of AFIB.  The various methods of treatment have been discussed with the patient and family. After consideration of risks, benefits and other options for treatment, the patient has consented to  Procedures: CARDIOVERSION (N/A) as a surgical intervention.  The patient's history has been reviewed, patient examined, no change in status, stable for surgery.  I have reviewed the patient's chart and labs.  Questions were answered to the patient's satisfaction.     Maley Venezia A Avary Pitsenbarger

## 2024-03-24 NOTE — Anesthesia Postprocedure Evaluation (Signed)
"   Anesthesia Post Note  Patient: Belinda Garrett  Procedure(s) Performed: CARDIOVERSION     Patient location during evaluation: Cath Lab Anesthesia Type: General Level of consciousness: awake Pain management: pain level controlled Vital Signs Assessment: post-procedure vital signs reviewed and stable Respiratory status: spontaneous breathing, nonlabored ventilation and respiratory function stable Cardiovascular status: blood pressure returned to baseline and stable Postop Assessment: no apparent nausea or vomiting Anesthetic complications: no   There were no known notable events for this encounter.  Last Vitals:  Vitals:   03/24/24 1200 03/24/24 1205  BP: 100/74 105/72  Pulse: 73 79  Resp: (!) 25 (!) 22  Temp:    SpO2: 98% 95%    Last Pain:  Vitals:   03/24/24 1031  TempSrc:   PainSc: 2                  Merick Kelleher P Brynden Thune      "

## 2024-03-24 NOTE — CV Procedure (Signed)
" ° ° °  Electrical Cardioversion Procedure Note Belinda Garrett [id number] 03-05-1971  Procedure: Electrical Cardioversion Indications:  Atrial Fibrillation  Time Out: Verified patient identification, verified procedure,medications/allergies/relevent history reviewed, required imaging and test results available.  Performed  Procedure Details  The patient was NPO after midnight. Anesthesia was administered at the beside  by Dr.Ellinger's team. Cardioversion was done with synchronized biphasic defibrillation with 200 joules but was not successful.  Single sinus beat after cardioversion with 300 joules.  Unsucessful cardioversion at 360 joules. The patient tolerated the procedure well.  IMPRESSION:  Unsuccessful cardioversion of atrial fibrillation.   Stanly Leavens, MD FASE [facility] Cardiologist [facility]  [address] [place], [address] [place], [place] [address] [phone]  11:40 AM     "

## 2024-03-25 ENCOUNTER — Other Ambulatory Visit (HOSPITAL_COMMUNITY): Payer: Self-pay

## 2024-03-25 ENCOUNTER — Telehealth: Payer: Self-pay

## 2024-03-25 NOTE — Telephone Encounter (Signed)
 I spoke with Ms. Alles to offer an earlier appointment with Suzann Riddle, NP to discuss the possibility of new medications post an attempted Cardioversion. Patient scheduled for 1:30 pm on Wednesday 03/26/2024. Suzann Riddle, NP notified.

## 2024-03-25 NOTE — Progress Notes (Unsigned)
 "     Electrophysiology Clinic Note    Date:  03/26/2024  Patient ID:  Dail, Meece 1970/12/30, MRN [id number] PCP:  Dineen Rollene MATSU, FNP  Cardiologist:  None  Electrophysiologist:  Fonda Kitty, MD  Electrophysiology APP:  Aryonna Gunnerson, NP     Discussed the use of AI scribe software for clinical note transcription with the patient, who gave verbal consent to proceed.   Patient Profile    Chief Complaint: AF follow-up   History of Present Illness: GURNOOR SLOOP is a 54 y.o. female with PMH notable for AFib, SVT, HTN, OSA, morbid obesity; seen today for Fonda Kitty, MD (Previously Dr. Cindie) for acute follow-up d/t failed DCCV earlier this week.   She is s/p DCCV 01/2021 and had done well until recurrence of Afib noted 02/2024. She is s/p unsuccessful DCCV earlier this week.   On follow-up today, she remains relatively asymptomatic with intermittent palpitations. She continues to take eliquis  BID without bleeding concerns.  She questions whether weight loss may help treat her AFib. She was previously on ozempic  and noticed a lump in her neck, stopped ozempic  and saw ENT, and never restarted ozempic .  She uses CPAP nightly, followed by ENT.     Arrhythmia/Device History No specialty comments available.    ROS:  Please see the history of present illness. All other systems are reviewed and otherwise negative.    Physical Exam    VS:  BP 132/72 (BP Location: Left Arm, Patient Position: Sitting, Cuff Size: Normal)   Pulse 90   Ht 5' 1 (1.549 m)   Wt (!) 470 lb 3.2 oz (213.3 kg)   SpO2 97%   BMI 88.84 kg/m  BMI: Body mass index is 88.84 kg/m.           Wt Readings from Last 3 Encounters:  03/26/24 (!) 470 lb 3.2 oz (213.3 kg)  03/24/24 (!) 467 lb (211.8 kg)  02/25/24 (!) 463 lb 3.2 oz (210.1 kg)      GEN- The patient is well appearing, alert and oriented x 3 today.   Lungs- diminished throughout, clear to ausculation bilaterally, normal work of  breathing.  Heart- Irregularly irregular rate and rhythm, no murmurs, rubs or gallops Extremities- chronic-appearing peripheral edema, warm, dry  Studies Reviewed   Previous EP, cardiology notes.    EKG is ordered. Personal review of EKG from today shows:    EKG Interpretation Date/Time:  Wednesday March 26 2024 13:44:50 EST Ventricular Rate:  90 PR Interval:    QRS Duration:  84 QT Interval:  376 QTC Calculation: 459 R Axis:   -24  Text Interpretation: Atrial fibrillation Low voltage QRS Confirmed by Klinton Candelas [id number]) on 03/26/2024 1:48:29 PM     Long term monitor, 03/17/2024 Patch Wear Time:  6 days and 23 hours (2025-12-11T17:59:45-0500 to 2025-12-18T17:32:52-0500)   HR 54 - 168, average 85 bpm. Atrial fibrillation detected. Burden 100%.  No supraventricular ectopy. Rare ventricular ectopy. Symptom trigger episodes correspond to AF with RVR, rare PVCs.  TTE, 02/25/2024  1. Left ventricular ejection fraction, by estimation, is 55 to 60%. Left ventricular ejection fraction by PLAX is 70 %. The left ventricle has normal function. The left ventricle has no regional wall motion abnormalities. Left ventricular diastolic parameters are indeterminate.   2. Right ventricular systolic function is normal. The right ventricular size is normal.   3. The mitral valve is normal in structure. No evidence of mitral valve regurgitation. No evidence of mitral stenosis.  4. The aortic valve is normal in structure. Aortic valve regurgitation is not visualized. No aortic stenosis is present.   5. The inferior vena cava is normal in size with greater than 50% respiratory variability, suggesting right atrial pressure of 3 mmHg.   Long term monitor, 06/15/2021 HR 54 - 203 bpm, average 82 bpm. 36 SVT, longest 15 beats at an average rate of 120 bpm Symptom triggered episodes correspond to SVT. Rare supraventricular and ventricular ectopy. No sustained arrhythmias.   Assessment and Plan      #) persis AFib Remains in afib after unsuccessful DCCV earlier this week Long discussion with patient regarding AAD options, specifically discussed amiodarone vs tikosyn and the risks/benefits of both medications At this time, would favor tikosyn. Patient is cautiously agreeable. Advised she would need to discuss weaning/stopping trazodone  and hydrochlorothiazide  prior to tikosyn loading. She will call PCP to discuss medication changes.  Will send msg to AFib clinic to begin tikosyn loading process  #) Hypercoag d/t afib CHA2DS2-VASc Score = at least 2 [CHF History: 0, HTN History: 1, Diabetes History: 0, Stroke History: 0, Vascular Disease History: 0, Age Score: 0, Gender Score: 1].  Therefore, the patient's annual risk of stroke is 2.2 %.    Stroke ppx - 5mg  eliquis  BID, appropriately dosed No significant bleeding concerns  #) OSA Continue nightly CPAP usage  #) elevated BMI We discussed the strong link between obesity and AFib She will discuss weight loss medications further with PCP    Current medicines are reviewed at length with the patient today.   The patient does not have concerns regarding her medicines.  The following changes were made today:  none  Labs/ tests ordered today include:  Orders Placed This Encounter  Procedures   EKG 12-Lead     Disposition: Follow up with Dr. Kennyth or EP APP in 3 months   Signed, Masoud Nyce, NP  03/26/2024  3:48 PM  Electrophysiology [facility] "

## 2024-03-26 ENCOUNTER — Other Ambulatory Visit: Payer: Self-pay

## 2024-03-26 ENCOUNTER — Encounter: Payer: Self-pay | Admitting: Cardiology

## 2024-03-26 ENCOUNTER — Other Ambulatory Visit (HOSPITAL_COMMUNITY): Payer: Self-pay

## 2024-03-26 ENCOUNTER — Telehealth (HOSPITAL_COMMUNITY): Payer: Self-pay

## 2024-03-26 ENCOUNTER — Encounter (HOSPITAL_COMMUNITY): Payer: Self-pay | Admitting: *Deleted

## 2024-03-26 ENCOUNTER — Ambulatory Visit: Admitting: Cardiology

## 2024-03-26 ENCOUNTER — Encounter: Payer: Self-pay | Admitting: Family

## 2024-03-26 VITALS — BP 132/72 | HR 90 | Ht 61.0 in | Wt >= 6400 oz

## 2024-03-26 DIAGNOSIS — G4733 Obstructive sleep apnea (adult) (pediatric): Secondary | ICD-10-CM

## 2024-03-26 DIAGNOSIS — Z6841 Body Mass Index (BMI) 40.0 and over, adult: Secondary | ICD-10-CM

## 2024-03-26 DIAGNOSIS — I4819 Other persistent atrial fibrillation: Secondary | ICD-10-CM

## 2024-03-26 DIAGNOSIS — D6869 Other thrombophilia: Secondary | ICD-10-CM | POA: Diagnosis not present

## 2024-03-26 NOTE — Patient Instructions (Addendum)
 Medication Instructions:  Your physician recommends the following medication changes.   START TAKING: Tikosyn   *If you need a refill on your cardiac medications before your next appointment, please call your pharmacy*  Lab Work: No labs ordered today    Testing/Procedures: No test ordered today   Follow-Up: At [facility], you and your health needs are our priority.  As part of our continuing mission to provide you with exceptional heart care, our providers are all part of one team.  This team includes your primary Cardiologist (physician) and Advanced Practice Providers or APPs (Physician Assistants and Nurse Practitioners) who all work together to provide you with the care you need, when you need it.  Your next appointment:   3 month(s)  Provider:   Suzann Riddle, NP     Afib Clinic will reach out to you.    Dofetilide (Tikosyn) Hospital Admission  Preparing for admission:  Check with drug insurance company for cost of drug to ensure affordability --- Price check the generic of Tikosyn which is Dofetilide 500 mcg twice a day.   GoodRx is an option if insurance copay is unaffordable. Patient assistance is also available.  A pharmacist will review all your medications for potential interactions with Tikosyn. If any medication changes are needed prior to admission we will be in touch with you.  If any new medications are started AFTER your admission date is set with Glade RN [phone]). Please notify our office immediately so your medication list can be updated and reviewed by our pharmacist again.  Please ensure no missed doses of your anticoagulation (blood thinner) for 3 weeks prior to admission. If a dose is missed please notify our office immediately.  If you are on coumadin/warfarin we will require 4 weekly therapeutic INR checks prior to admission.  No Benadryl is allowed 3 days prior to admission.   On day of admission:  Afib Clinic office visit  will be scheduled on the morning of admission for preliminary labs and EKG.  Please take your morning medications and have a normal breakfast prior to arrival to the appointment.     Tikosyn initiation requires a 3 night/4 day hospital stay with constant telemetry monitoring. You will have an EKG after each dose of Tikosyn as well as daily lab draws.  You may bring personal belongings/clothing with you to the hospital. Please leave your suitcase in the car until you arrive in admissions.  If the drug does not convert you to normal rhythm a cardioversion will be performed after the 4th dose of Tikosyn.   If you use a CPAP machine; please bring this with you to the hospital.   Time of admission is dependent on bed availability in the hospital. In some instances, you will be sent home until bed is available. Rarely admission can be delayed to the following day if hospital census prevents available beds.  Questions please call our office at [phone]

## 2024-03-26 NOTE — Telephone Encounter (Signed)
 Pharmacy Patient Advocate Encounter  Insurance verification completed.    The patient is insured through [facility]. Patient has [employer], may use a copay card, and/or apply for patient assistance if available.    Ran test claim for Tikosyn 500mcg capsule and the current 30 day co-pay is $5.   This test claim was processed through [employer]- copay amounts may vary at other pharmacies due to [employer], or as the patient moves through the different stages of their insurance plan.

## 2024-03-27 NOTE — Telephone Encounter (Signed)
 Spoke with patient and video visit has been scheduled per patient request. 03/28/2024 at 9:30a

## 2024-03-28 ENCOUNTER — Other Ambulatory Visit (HOSPITAL_COMMUNITY): Payer: Self-pay

## 2024-03-28 ENCOUNTER — Encounter: Payer: Self-pay | Admitting: Family

## 2024-03-28 ENCOUNTER — Telehealth: Payer: Self-pay

## 2024-03-28 ENCOUNTER — Other Ambulatory Visit: Payer: Self-pay

## 2024-03-28 ENCOUNTER — Telehealth: Admitting: Family

## 2024-03-28 VITALS — BP 100/74 | Ht 61.0 in | Wt >= 6400 oz

## 2024-03-28 DIAGNOSIS — G4733 Obstructive sleep apnea (adult) (pediatric): Secondary | ICD-10-CM | POA: Diagnosis not present

## 2024-03-28 DIAGNOSIS — I480 Paroxysmal atrial fibrillation: Secondary | ICD-10-CM | POA: Diagnosis not present

## 2024-03-28 DIAGNOSIS — R002 Palpitations: Secondary | ICD-10-CM

## 2024-03-28 DIAGNOSIS — Z6841 Body Mass Index (BMI) 40.0 and over, adult: Secondary | ICD-10-CM | POA: Diagnosis not present

## 2024-03-28 DIAGNOSIS — E785 Hyperlipidemia, unspecified: Secondary | ICD-10-CM

## 2024-03-28 MED ORDER — ZEPBOUND 2.5 MG/0.5ML ~~LOC~~ SOAJ
2.5000 mg | SUBCUTANEOUS | 2 refills | Status: DC
  Filled 2024-03-28: qty 2, 28d supply, fill #0

## 2024-03-28 NOTE — Progress Notes (Unsigned)
 Virtual Visit via Video Note  I connected with Belinda Garrett on 03/28/2024 at  9:30 AM EST by a video enabled telemedicine application and verified that I am speaking with the correct person using two identifiers. Location patient: home Location provider: work  Persons participating in the virtual visit: patient, provider  I discussed the limitations of evaluation and management by telemedicine and the availability of in person appointments. The patient expressed understanding and agreed to proceed.  HPI: Discussed the use of AI scribe software for clinical note transcription with the patient, who gave verbal consent to proceed.  History of Present Illness   [name] is a 54 year old female with atrial fibrillation who presents for discussion of starting Tikosyn.  She is currently taking hydrochlorothiazide  for leg swelling and blood pressure management, metformin , trazodone  for sleep, and Prozac  .  She has a history of sleep apnea, diagnosed approximately ten years ago, and uses a CPAP machine.    Consult cardiology 03/26/2024 for persistent atrial fibrillation  Unsuccessful DCCV.  Discussed MRI versus Tikosyn.  Favor Tikosyn.  Discussed drug interaction with trazodone , hydrochlorothiazide . metfomrin History of OSA, preDM  No CKD  She is compliant with Prozac  60 mg daily.  EKG shows QT/QTcB 376/459 ms.   Starting GLP agonist / GIP/GLP:   Denies family history medullary thyroid  cancer, multiple endocrine neoplasia. Denies personal history of pancreatitis, multiple endocrine neoplasia, chronic constipation. Eye exam is up-to-date.  No known history of retinopathy    ROS: See pertinent positives and negatives per HPI.  EXAM:  VITALS per patient if applicable: BP 100/74   Ht 5' 1 (1.549 m)   Wt (!) 470 lb (213.2 kg)   BMI 88.81 kg/m  BP Readings from Last 3 Encounters:  03/28/24 100/74  03/26/24 132/72  03/24/24 105/72   Wt Readings from Last 3  Encounters:  03/28/24 (!) 470 lb (213.2 kg)  03/26/24 (!) 470 lb 3.2 oz (213.3 kg)  03/24/24 (!) 467 lb (211.8 kg)   Lab Results  Component Value Date   HGBA1C 6.0 07/19/2022    GENERAL: alert, oriented, appears well and in no acute distress  HEENT: atraumatic, conjunttiva clear, no obvious abnormalities on inspection of external nose and ears  NECK: normal movements of the head and neck  LUNGS: on inspection no signs of respiratory distress, breathing rate appears normal, no obvious gross SOB, gasping or wheezing  CV: no obvious cyanosis  MS: moves all visible extremities without noticeable abnormality  PSYCH/NEURO: pleasant and cooperative, no obvious depression or anxiety, speech and thought processing grossly intact  ASSESSMENT AND PLAN: OSA on CPAP Assessment & Plan: Stop metformin  due to drug interaction with Tikosyn.  Will start Zepbound  2.5 mg and titrate.  Counseled on blackbox warning, mechanism of action and side effects.  Orders: -     Zepbound ; Inject 2.5 mg into the skin once a week.  Dispense: 2 mL; Refill: 2  Hyperlipidemia LDL goal <100  Palpitations  Morbid obesity with BMI of 70 and over, adult (HCC) -     Zepbound ; Inject 2.5 mg into the skin once a week.  Dispense: 2 mL; Refill: 2  Paroxysmal atrial fibrillation (HCC) Assessment & Plan: Discussed drug interactions ahead of loading dose, hospital stay with starting Tikosyn.   Advised to STOP metformin , hydrochlorothiazide , trazodone  due to drug-drug interaction with Tikosyn START Zepbound  and lieu of metformin  and due to history of OSA I favor switching prozac  to zoloft  to treat insomnia, anxiety and  depression as she will not be on trazodone .  Consult with Pharm.D., Manuelita Kobs EKG shows QT/QTcB 376/459 ms. She will need repeat EKG in 2-3 weeks.       -we discussed possible serious and likely etiologies, options for evaluation and workup, limitations of telemedicine visit vs in person visit,  treatment, treatment risks and precautions. Pt prefers to treat via telemedicine empirically rather then risking or undertaking an in person visit at this moment.    I discussed the assessment and treatment plan with the patient. The patient was provided an opportunity to ask questions and all were answered. The patient agreed with the plan and demonstrated an understanding of the instructions.   The patient was advised to call back or seek an in-person evaluation if the symptoms worsen or if the condition fails to improve as anticipated.  Advised if desired AVS can be mailed or viewed via MyChart if Mychart user.   Rollene Northern, FNP

## 2024-03-28 NOTE — Telephone Encounter (Signed)
 Medication list reviewed in anticipation of upcoming Tikosyn initiation. Patient is not taking any contraindicated or QTc prolonging medications. However, patient is a taking some  QTc prolonging agents.   Fluoxetine : indeterminate risk to increase Qtc. Appears patient may be changed to sertraline , which is similar risk. Either medication is ok with close monitoring.   Hydrochlorothiazide  and trazodone  already stopped. Metformin  was replaced with Zepbound .   Patient is anticoagulated on Eliquis  5 mg BID on the appropriate dose. Please ensure that patient has not missed any anticoagulation doses in the 3 weeks prior to Tikosyn initiation.   Patient will need to be counseled to avoid use of Benadryl while on Tikosyn and in the 2-3 days prior to Tikosyn initiation.  Belinda Garrett, PharmD PGY1 Pharmacy Resident  03/28/2024

## 2024-03-28 NOTE — Patient Instructions (Addendum)
 It is imperative that you are seen AT least twice per year for labs and monitoring. Monitor blood pressure at home and me 5-6 reading on separate days. Goal is less than 120/80, based on newest guidelines, however we certainly want to be less than 130/80;  if persistently higher, please make sooner follow up appointment so we can recheck you blood pressure and manage/ adjust medications.  STOP metformin , hydrochlorothiazide , trazodone   START Zepbound   I favor switching prozac  to zoloft  to treat insomnia, anxiety and depression.   Zoloft  is in the same drug class as prozac  (SSRI).   start Zepbound  2.5mg  once per week injected subcutaneously ( [place])  in stomach. Please clean with alcohol swab prior to injection and be sure to rotate site. You may schedule a nurse visit if you would like to first injection.   After 4 weeks, and if tolerated and weight loss has not reached 1-2 lbs per week, please increase to 5mg  once per week [place].  Once you are actively losing weight, you do not need to further increase medication.  Dose increments are below.   7.5 mg/0.5 mL (0.5 mL) 10 mg/0.5 mL (0.5 mL) 12.5 mg/0.5 mL (0.5 mL) 15 mg/0.5 mL (0.5 mL)   Please read information on medication below and remember black box warning that you may not take if you or a family member is diagnosed with thyroid  cancer (medullary thyroid  cancer), or multiple endocrine neoplasia.       Brand Names: [place]  Mounjaro ; Zepbound  Brand Names: [place] Mounjaro   Warning  This drug has been shown to cause thyroid  cancer in some animals. It is not known if this happens in humans. If thyroid  cancer happens, it may be deadly if not found and treated early. Call your doctor right away if you have a neck mass, trouble breathing, trouble swallowing, or have hoarseness that will not go away.  Do not use this drug if you have a health problem called Multiple Endocrine Neoplasia syndrome type 2 (MEN 2), or if you or a family member have had  thyroid  cancer.  Have your blood work checked and thyroid  ultrasounds as you have been told by your doctor. What is this drug used for?  It is used to lower blood sugar in people with type 2 diabetes.  It is used to help with weight loss in certain people. What do I need to tell my doctor BEFORE I take this drug? All products:  If you are allergic to this drug; any part of this drug; or any other drugs, foods, or substances. Tell your doctor about the allergy and what signs you had.  If you have ever had pancreatitis.  If you have stomach or bowel problems.  If you are using another drug that has the same drug in it.  If you are using another drug like this one. If you are not sure, ask your doctor or pharmacist. If you are using this drug for diabetes:  If you have type 1 diabetes. Do not use this drug to treat type 1 diabetes. Zepbound :  If you have or have ever had depression or thoughts of suicide. This is not a list of all drugs or health problems that interact with this drug. Tell your doctor and pharmacist about all of your drugs (prescription or OTC, natural products, vitamins) and health problems. You must check to make sure that it is safe for you to take this drug with all of your drugs and health problems. Do not start,  stop, or change the dose of any drug without checking with your doctor. What are some things I need to know or do while I take this drug? All products:  Tell all of your health care providers that you take this drug. This includes your doctors, nurses, pharmacists, and dentists.  Follow the diet and workout plan that your doctor told you about.  Talk with your doctor before you drink alcohol.  Birth control pills may not work as well to prevent pregnancy. If you take birth control pills, you may need to switch to another type of hormone-based birth control like a vaginal ring if your doctor tells you to. If another type of hormone-based birth control is not an  option, use some other kind of birth control also, like a condom. Do this for 4 weeks after starting this drug and for 4 weeks each time the dose is raised.  This drug may prevent other drugs taken by mouth from getting into the body. If you take other drugs by mouth, you may need to take them at some other time than this drug. Talk with your doctor.  Do not share with another person even if the needle has been changed. Sharing your tray or pen may pass infections from one person to another. This includes infections you may not know you have.  If you cannot drink liquids by mouth or if you have upset stomach, throwing up, or diarrhea that does not go away; you need to avoid getting dehydrated. Contact your doctor to find out what to do. Dehydration may lead to low blood pressure or to new or worse kidney problems.  A severe and sometimes deadly pancreas problem (pancreatitis) has happened with other drugs like this one. If you are using this drug for diabetes:  It may be harder to control blood sugar during times of stress such as fever, infection, injury, or surgery. A change in physical activity, exercise, or diet may also affect blood sugar.  Check your blood sugar as you have been told by your doctor.  Do not drive if your blood sugar has been low. There is a greater chance of you having a crash.  Wear disease medical alert ID (identification).  Tell your doctor if you are pregnant, plan on getting pregnant, or are breast-feeding. You will need to talk about the benefits and risks to you and the baby. Zepbound :  If you have high blood sugar (diabetes), you will need to watch your blood sugar closely.  Weight loss during pregnancy may cause harm to the unborn baby. If you get pregnant while taking this drug or if you want to get pregnant, call your doctor right away.  Tell your doctor if you are breast-feeding. You will need to talk about any risks to your baby. What are some side effects that I  need to call my doctor about right away? WARNING/CAUTION: Even though it may be rare, some people may have very bad and sometimes deadly side effects when taking a drug. Tell your doctor or get medical help right away if you have any of the following signs or symptoms that may be related to a very bad side effect: All products:  Signs of an allergic reaction, like rash; hives; itching; red, swollen, blistered, or peeling skin with or without fever; wheezing; tightness in the chest or throat; trouble breathing, swallowing, or talking; unusual hoarseness; or swelling of the mouth, face, lips, tongue, or throat.  Signs of kidney problems like unable  to pass urine, change in how much urine is passed, blood in the urine, or a big weight gain.  Signs of gallbladder problems like pain in the upper right belly area, right shoulder area, or between the shoulder blades; yellow skin or eyes; fever with chills; bloating; or very upset stomach or throwing up.  Signs of a pancreas problem (pancreatitis) like very bad stomach pain, very bad back pain, or very bad upset stomach or throwing up.  Dizziness or passing out.  A fast heartbeat.  Change in eyesight.  Low blood sugar can happen. The chance may be raised when this drug is used with other drugs for diabetes. Signs may be dizziness, headache, feeling sleepy or weak, shaking, fast heartbeat, confusion, hunger, or sweating. Call your doctor right away if you have any of these signs. Follow what you have been told to do for low blood sugar. This may include taking glucose tablets, liquid glucose, or some fruit juices. Zepbound :  New or worse behavior or mood changes like depression or thoughts of suicide. What are some other side effects of this drug? All drugs may cause side effects. However, many people have no side effects or only have minor side effects. Call your doctor or get medical help if any of these side effects or any other side effects bother you or do  not go away: All products:  Constipation, diarrhea, stomach pain, upset stomach, throwing up, or decreased appetite.  Heartburn.  Pain, itching, or other irritation where the injection was given. Zepbound :  Feeling tired or weak. These are not all of the side effects that may occur. If you have questions about side effects, call your doctor. Call your doctor for medical advice about side effects. You may report side effects to your national health agency. How is this drug best taken? Use this drug as ordered by your doctor. Read all information given to you. Follow all instructions closely. All products:  It is given as a shot into the fatty part of the skin on the top of the thigh, belly area, or upper arm.  If you will be giving yourself the shot, your doctor or nurse will teach you how to give the shot.  Keep taking this drug as you have been told by your doctor or other health care provider, even if you feel well.  Take the same day each week.  Move site where you give the shot each time.  Take with or without food.  Wash your hands before and after use.  Do not use if the solution is leaking or has particles.  This drug is colorless to a faint yellow. Do not use if the solution changes color.  Do not move this drug from the pen to a syringe.  Each pen or vial is for 1 use only. Throw away any part of the used pen after the dose is given.  Throw away needles in a needle/sharp disposal box. Do not reuse needles or other items. When the box is full, follow all local rules for getting rid of it. Talk with a doctor or pharmacist if you have any questions. If you are using this drug for diabetes:  If you are also using insulin , you may inject this drug and the insulin  in the same area of the body but not right next to each other.  Do not mix this drug in the same syringe with insulin . What do I do if I miss a dose?  If it is  within 4 days after the missed dose, take the missed dose and go  back to your normal day.  If it has been more than 4 days since the missed dose, skip the missed dose and go back to your normal day.  Do not take 2 doses at the same time or extra doses. How do I store and/or throw out this drug?  Store in a refrigerator. Do not freeze.  Do not use if it has been frozen.  If needed, each pen or vial may be stored at room temperature for up to 21 days. If you store at room temperature, throw away any part not used after 21 days.  Protect from heat.  Store in the original container to protect from light.  Keep all drugs in a safe place. Keep all drugs out of the reach of children and pets.  Throw away unused or expired drugs. Do not flush down a toilet or pour down a drain unless you are told to do so. Check with your pharmacist if you have questions about the best way to throw out drugs. There may be drug take-back programs in your area. General drug facts  If your symptoms or health problems do not get better or if they become worse, call your doctor.  Do not share your drugs with others and do not take anyone else's drugs.  Some drugs may have another patient information leaflet. If you have any questions about this drug, please talk with your doctor, nurse, pharmacist, or other health care provider.  If you think there has been an overdose, call your poison control center or get medical care right away. Be ready to tell or show what was taken, how much, and when it happened. Last Reviewed Date 2022-02-03 Consumer Information Use and Disclaimer This generalized information is a limited summary of diagnosis, treatment, and/or medication information. It is not meant to be comprehensive and should be used as a tool to help the user understand and/or assess potential diagnostic and treatment options. It does NOT include all information about conditions, treatments, medications, side effects, or risks that may apply to a specific patient. It is not intended to be  medical advice or a substitute for the medical advice, diagnosis, or treatment of a health care provider based on the health care provider's examination and assessment of a patient's specific and unique circumstances. Patients must speak with a health care provider for complete information about their health, medical questions, and treatment options, including any risks or benefits regarding use of medications. This information does not endorse any treatments or medications as safe, effective, or approved for treating a specific patient. UpToDate, Inc. and its affiliates disclaim any warranty or liability relating to this information or the use thereof. The use of this information is governed by the Terms of Use, available at https://www.wolterskluwer.com/en/know/clinical-effectiveness-terms.  2023 UpToDate, Inc. and its affiliates and/or licensors. All rights reserved. Use of UpToDate is subject to the Terms of Use. Topic [id number] Version 16.0

## 2024-03-28 NOTE — Assessment & Plan Note (Addendum)
 Discussed drug interactions ahead of loading dose, hospital stay with starting Tikosyn.   Advised to STOP metformin , hydrochlorothiazide , trazodone  due to drug-drug interaction with Tikosyn START Zepbound  and lieu of metformin  and due to history of OSA I favor switching prozac  to zoloft  to treat insomnia, anxiety and depression as she will not be on trazodone .  Consult with Pharm.D., Manuelita Kobs EKG shows QT/QTcB 376/459 ms. She will need repeat EKG in 2-3 weeks.

## 2024-03-28 NOTE — Assessment & Plan Note (Signed)
 Stop metformin  due to drug interaction with Tikosyn.  Will start Zepbound  2.5 mg and titrate.  Counseled on blackbox warning, mechanism of action and side effects.  Update: 04/11/24 Zepbound  was not approved. Metformin  increased 1000 mg to aid in additional weight loss.Consulted with pharmD , Katrina Bitcon.

## 2024-03-31 ENCOUNTER — Encounter: Payer: Self-pay | Admitting: Family

## 2024-03-31 ENCOUNTER — Other Ambulatory Visit: Payer: Self-pay | Admitting: Family

## 2024-03-31 ENCOUNTER — Other Ambulatory Visit: Payer: Self-pay

## 2024-03-31 ENCOUNTER — Other Ambulatory Visit (HOSPITAL_COMMUNITY): Payer: Self-pay

## 2024-03-31 DIAGNOSIS — G4700 Insomnia, unspecified: Secondary | ICD-10-CM

## 2024-03-31 DIAGNOSIS — F39 Unspecified mood [affective] disorder: Secondary | ICD-10-CM

## 2024-03-31 DIAGNOSIS — R7303 Prediabetes: Secondary | ICD-10-CM

## 2024-03-31 MED ORDER — SERTRALINE HCL 50 MG PO TABS
50.0000 mg | ORAL_TABLET | Freq: Every day | ORAL | 3 refills | Status: AC
  Filled 2024-03-31: qty 90, 90d supply, fill #0

## 2024-03-31 MED ORDER — METFORMIN HCL ER 500 MG PO TB24
1000.0000 mg | ORAL_TABLET | Freq: Every evening | ORAL | 3 refills | Status: AC
  Filled 2024-03-31: qty 180, 90d supply, fill #0

## 2024-03-31 NOTE — Telephone Encounter (Signed)
 Belinda Garrett,   An update.  Zepbound  was not approved.  I have kept her on  metformin  AND increased 1000 mg to aid in additional weight loss.  Are you comfortable with continued increases in metformin   over time after hospitalization for Tikosyn?  She may likely require metformin  2000 mg maximum per day for weight loss  I am transitioning her from Prozac  60 mg to Zoloft  50 mg.  She is no longer on trazodone , hydrochlorothiazide .    She is not taking Zepbound  or Prozac .  Belinda Garrett

## 2024-04-01 NOTE — Telephone Encounter (Signed)
 Responded in 1/7 mychart message

## 2024-04-02 ENCOUNTER — Other Ambulatory Visit: Payer: Self-pay

## 2024-04-04 ENCOUNTER — Telehealth (HOSPITAL_COMMUNITY): Payer: Self-pay

## 2024-04-04 NOTE — Telephone Encounter (Signed)
 [employer] Pre-Cert Agent Charmaine [phone] stated that CPT [phone] was an invalid code to start pre-cert however when patient is admitted on 04/22/24 it will flag case on file.

## 2024-04-10 ENCOUNTER — Other Ambulatory Visit: Payer: Self-pay | Admitting: Family

## 2024-04-10 DIAGNOSIS — G629 Polyneuropathy, unspecified: Secondary | ICD-10-CM

## 2024-04-11 ENCOUNTER — Other Ambulatory Visit (HOSPITAL_COMMUNITY): Payer: Self-pay

## 2024-04-11 ENCOUNTER — Other Ambulatory Visit: Payer: Self-pay

## 2024-04-11 MED ORDER — GABAPENTIN 400 MG PO CAPS
400.0000 mg | ORAL_CAPSULE | Freq: Two times a day (BID) | ORAL | 3 refills | Status: AC
  Filled 2024-04-11: qty 180, 90d supply, fill #0

## 2024-04-17 ENCOUNTER — Encounter (HOSPITAL_COMMUNITY): Payer: Self-pay | Admitting: *Deleted

## 2024-04-18 ENCOUNTER — Encounter (HOSPITAL_COMMUNITY): Payer: Self-pay

## 2024-04-20 ENCOUNTER — Encounter: Payer: Self-pay | Admitting: Family

## 2024-04-21 ENCOUNTER — Ambulatory Visit: Admitting: Cardiology

## 2024-04-22 ENCOUNTER — Encounter (HOSPITAL_COMMUNITY): Payer: Self-pay | Admitting: Nurse Practitioner

## 2024-04-22 ENCOUNTER — Ambulatory Visit (HOSPITAL_COMMUNITY): Payer: Self-pay | Admitting: Nurse Practitioner

## 2024-04-22 ENCOUNTER — Ambulatory Visit (HOSPITAL_COMMUNITY)
Admission: RE | Admit: 2024-04-22 | Discharge: 2024-04-22 | Disposition: A | Source: Ambulatory Visit | Attending: Nurse Practitioner | Admitting: Nurse Practitioner

## 2024-04-22 ENCOUNTER — Other Ambulatory Visit: Payer: Self-pay

## 2024-04-22 ENCOUNTER — Encounter (HOSPITAL_COMMUNITY): Payer: Self-pay | Admitting: Student in an Organized Health Care Education/Training Program

## 2024-04-22 ENCOUNTER — Other Ambulatory Visit (HOSPITAL_COMMUNITY): Payer: Self-pay | Admitting: Nurse Practitioner

## 2024-04-22 ENCOUNTER — Inpatient Hospital Stay (HOSPITAL_COMMUNITY)
Admission: RE | Admit: 2024-04-22 | Discharge: 2024-04-25 | Disposition: A | Attending: Cardiology | Admitting: Cardiology

## 2024-04-22 VITALS — BP 112/90 | HR 100 | Ht 61.0 in | Wt >= 6400 oz

## 2024-04-22 DIAGNOSIS — I480 Paroxysmal atrial fibrillation: Principal | ICD-10-CM | POA: Diagnosis present

## 2024-04-22 DIAGNOSIS — D6869 Other thrombophilia: Secondary | ICD-10-CM

## 2024-04-22 DIAGNOSIS — I4819 Other persistent atrial fibrillation: Secondary | ICD-10-CM

## 2024-04-22 LAB — BASIC METABOLIC PANEL WITH GFR
BUN/Creatinine Ratio: 14 (ref 9–23)
BUN: 12 mg/dL (ref 6–24)
CO2: 27 mmol/L (ref 20–29)
Calcium: 9.5 mg/dL (ref 8.7–10.2)
Chloride: 105 mmol/L (ref 96–106)
Creatinine, Ser: 0.85 mg/dL (ref 0.57–1.00)
Glucose: 95 mg/dL (ref 70–99)
Potassium: 4.5 mmol/L (ref 3.5–5.2)
Sodium: 143 mmol/L (ref 134–144)
eGFR: 81 mL/min/{1.73_m2}

## 2024-04-22 LAB — MAGNESIUM: Magnesium: 2 mg/dL (ref 1.6–2.3)

## 2024-04-22 MED ORDER — MEDROXYPROGESTERONE ACETATE 2.5 MG PO TABS
2.5000 mg | ORAL_TABLET | Freq: Every day | ORAL | Status: DC
  Administered 2024-04-23 – 2024-04-25 (×3): 2.5 mg via ORAL
  Filled 2024-04-22 (×3): qty 1

## 2024-04-22 MED ORDER — SODIUM CHLORIDE 0.9 % IV SOLN: 250.0000 mL | INTRAVENOUS | Status: AC | PRN

## 2024-04-22 MED ORDER — SODIUM CHLORIDE 0.9% FLUSH: 3.0000 mL | INTRAVENOUS | Status: DC | PRN

## 2024-04-22 MED ORDER — SERTRALINE HCL 50 MG PO TABS
50.0000 mg | ORAL_TABLET | Freq: Every day | ORAL | Status: DC
  Administered 2024-04-22: 50 mg via ORAL
  Filled 2024-04-22 (×2): qty 1

## 2024-04-22 MED ORDER — APIXABAN 5 MG PO TABS: 5.0000 mg | ORAL_TABLET | Freq: Two times a day (BID) | ORAL | Status: DC

## 2024-04-22 MED ORDER — SODIUM CHLORIDE 0.9% FLUSH
3.0000 mL | Freq: Two times a day (BID) | INTRAVENOUS | Status: DC
  Administered 2024-04-22 – 2024-04-24 (×5): 3 mL via INTRAVENOUS

## 2024-04-22 MED ORDER — GABAPENTIN 400 MG PO CAPS
400.0000 mg | ORAL_CAPSULE | Freq: Two times a day (BID) | ORAL | Status: DC
  Administered 2024-04-22 – 2024-04-25 (×6): 400 mg via ORAL
  Filled 2024-04-22 (×6): qty 1

## 2024-04-22 MED ORDER — METOPROLOL SUCCINATE ER 25 MG PO TB24
25.0000 mg | ORAL_TABLET | Freq: Every day | ORAL | Status: DC
  Administered 2024-04-22: 25 mg via ORAL
  Filled 2024-04-22 (×2): qty 1

## 2024-04-22 MED ORDER — LORATADINE 10 MG PO TABS
10.0000 mg | ORAL_TABLET | Freq: Every day | ORAL | Status: DC
  Administered 2024-04-23 – 2024-04-25 (×3): 10 mg via ORAL
  Filled 2024-04-22 (×3): qty 1

## 2024-04-22 MED ORDER — DOFETILIDE 500 MCG PO CAPS
500.0000 ug | ORAL_CAPSULE | Freq: Two times a day (BID) | ORAL | Status: DC
  Administered 2024-04-22 – 2024-04-25 (×6): 500 ug via ORAL
  Filled 2024-04-22 (×6): qty 1

## 2024-04-22 MED ORDER — METFORMIN HCL ER 500 MG PO TB24
1000.0000 mg | ORAL_TABLET | Freq: Every day | ORAL | Status: DC
  Administered 2024-04-23 – 2024-04-25 (×3): 1000 mg via ORAL
  Filled 2024-04-22 (×3): qty 2

## 2024-04-22 MED ORDER — APIXABAN 5 MG PO TABS
5.0000 mg | ORAL_TABLET | Freq: Two times a day (BID) | ORAL | Status: DC
  Administered 2024-04-22 – 2024-04-25 (×6): 5 mg via ORAL
  Filled 2024-04-22 (×6): qty 1

## 2024-04-22 MED ORDER — LISINOPRIL 20 MG PO TABS
30.0000 mg | ORAL_TABLET | Freq: Every day | ORAL | Status: DC
  Administered 2024-04-23 – 2024-04-25 (×3): 30 mg via ORAL
  Filled 2024-04-22 (×3): qty 1

## 2024-04-22 MED ORDER — ACETAMINOPHEN 500 MG PO TABS
500.0000 mg | ORAL_TABLET | Freq: Two times a day (BID) | ORAL | Status: DC | PRN
  Administered 2024-04-22: 500 mg via ORAL
  Filled 2024-04-22: qty 1

## 2024-04-22 NOTE — Plan of Care (Signed)

## 2024-04-22 NOTE — Addendum Note (Signed)
 Encounter addended by: Wyn Jackee VEAR Mickey., NP on: 04/22/2024 12:08 PM  Actions taken: Clinical Note Signed

## 2024-04-22 NOTE — H&P (Signed)
 "  Electrophysiology H&P  Note   Primary Care Physician: Belinda Rollene MATSU, FNP Primary Cardiologist: None Electrophysiologist: Belinda Kitty, MD  Referring Physician: Riddle, Suzann, NP   Belinda Garrett is a 54 y.o. female with a history of atrial fibrillation, HTN, OSA (on CPAP), morbid obesity, SVT, who presents for follow up in the [facility].  The patient was initially diagnosed with atrial fibrillation during postsurgical resection of meningioma in 09/2020.  She was evaluated by Dr. Cindie on 11/03/2020 with DCCV completed 01/28/2021.  She was seen 08/2023 by Belinda Needle, NP and reported an increase in palpitations secondary to recent URI and decongestants.  She was seen 02/25/2024 and presented in atrial fibrillation with updated Zio worn that showed 100% AF burden.  She was seen by televisit on 03/18/24 with discussion of DCCV versus antiarrhythmic therapy.  Decision was made to pursue DCCV which was completed on 03/24/2024 with ERAF.  She was seen in follow-up in atrial fibrillation and decision was made to pursue Tikosyn  loading.  She was advised to contact PCP regarding transition of HCTZ and trazodone  prior to initiation.  Belinda Garrett presents today for pre-Tikosyn  initiation visit.  On examination patient is in atrial fibrillation with controlled rate.  He denies any doses of Benadryl and has discontinued trazodone  as well as HCTZ since her previous follow-up. We discussed the pharmacology of Tikosyn  as well as the medications to avoid. We also discussed the importance of not doubling up on doses and making sure to keep a good regimen for medication administration.  We also briefly reviewed secondary prevention measures that can be taken completed in the future to help adjunct and prevent recurrence of atrial fibrillation.  Currently, she denies symptoms of palpitations, chest pain, shortness of breath, orthopnea, PND, lower extremity edema, dizziness, presyncope,  syncope, snoring, daytime somnolence, bleeding, or neurologic sequela. The patient is tolerating medications without difficulties and is otherwise without complaint today.   Discussed the use of AI scribe software for clinical note transcription with the patient, who gave verbal consent to proceed.   Atrial Fibrillation Management history: History of Sleep Apnea on CPAP Previous antiarrhythmic drugs: None Previous cardioversions: 01/2021, 03/24/2024 Previous ablations: Not a candidate due to obesity Anticoagulation history: Eliquis   ROS- All systems are reviewed and negative except as per the HPI above.   Physical Exam: BP (!) 112/90  Pulse 100  Ht 5' 1 (1.549 m)  Wt (!) 211.5 kg  BMI 88.09 kg/m   GEN: Well nourished, well developed in no acute distress NECK: No JVD; No carotid bruits CARDIAC: Irregularly irregular rate and rhythm, no murmurs, rubs, gallops RESPIRATORY:  Clear to auscultation without rales, wheezing or rhonchi  ABDOMEN: Soft, non-tender, non-distended EXTREMITIES:  No edema; No deformity   Wt Readings from Last 3 Encounters:  04/22/24 (!) 211.5 kg  03/28/24 (!) 213.2 kg  03/26/24 (!) 213.3 kg    Lab Results  Component Value Date   TSH 1.210 02/25/2024    ASSESSMENT AND PLAN: Persistent Atrial Fibrillation (ICD10:  I48.19) The patient's CHA2DS2-VASc score is 2, indicating a 2.2% annual risk of stroke.   -Patient presents for dofetilide  admission -Continue Eliquis , states no missed doses in the last 3 weeks. - No recent benadryl use -PharmD has screened medications for QT prolonging agents.  -Dose will be 500 mcg twice daily based on creatinine clearance - QTc in SR 384 ms - CrCl calculated at 247mL/min Potassium4.5 (02/03 1019) Magnesium  2.0 (02/03 1019) Creatinine, ser  0.85 (02/03 1019) Keep K > 4.0 and Mg > 2.0  - Educated on medication adherence, potential side effects: headache, dizziness, proarrhythmic effects. - Discussed lifestyle  modifications: CPAP, weight loss, alcohol reduction, increased physical activity.  OSA: - Continue CPAP therapy  Obesity: Patient's BMI is 88.09 kg/m and planning to discuss GLP-1 meds with her PCP Stressed importance of weight loss being imperative to maintaining NSR.  Pt presents as above   Signed,  Belinda Prentice Passey, PA-C    04/22/2024 1:52 PM     "

## 2024-04-23 ENCOUNTER — Telehealth (HOSPITAL_COMMUNITY): Payer: Self-pay | Admitting: Pharmacy Technician

## 2024-04-23 ENCOUNTER — Other Ambulatory Visit (HOSPITAL_COMMUNITY): Payer: Self-pay

## 2024-04-23 LAB — BASIC METABOLIC PANEL WITH GFR
Anion gap: 13 (ref 5–15)
BUN: 12 mg/dL (ref 6–20)
CO2: 24 mmol/L (ref 22–32)
Calcium: 9.3 mg/dL (ref 8.9–10.3)
Chloride: 103 mmol/L (ref 98–111)
Creatinine, Ser: 0.81 mg/dL (ref 0.44–1.00)
GFR, Estimated: >60 mL/min
Glucose, Bld: 115 mg/dL — ABNORMAL HIGH (ref 70–99)
Potassium: 3.8 mmol/L (ref 3.5–5.1)
Sodium: 140 mmol/L (ref 135–145)

## 2024-04-23 LAB — MAGNESIUM: Magnesium: 2.1 mg/dL (ref 1.7–2.4)

## 2024-04-23 LAB — HIV ANTIBODY (ROUTINE TESTING W REFLEX): HIV Screen 4th Generation wRfx: NONREACTIVE

## 2024-04-23 MED ORDER — SODIUM CHLORIDE 0.9% FLUSH
3.0000 mL | Freq: Two times a day (BID) | INTRAVENOUS | Status: DC
  Administered 2024-04-23 – 2024-04-24 (×3): 3 mL via INTRAVENOUS

## 2024-04-23 MED ORDER — METOPROLOL SUCCINATE ER 25 MG PO TB24
25.0000 mg | ORAL_TABLET | Freq: Every day | ORAL | Status: DC
  Administered 2024-04-23 – 2024-04-24 (×2): 25 mg via ORAL
  Filled 2024-04-23 (×2): qty 1

## 2024-04-23 MED ORDER — ACETAMINOPHEN 500 MG PO TABS
1000.0000 mg | ORAL_TABLET | Freq: Two times a day (BID) | ORAL | Status: DC | PRN
  Administered 2024-04-23 – 2024-04-24 (×2): 1000 mg via ORAL
  Filled 2024-04-23 (×2): qty 2

## 2024-04-23 MED ORDER — POTASSIUM CHLORIDE CRYS ER 20 MEQ PO TBCR
40.0000 meq | EXTENDED_RELEASE_TABLET | Freq: Once | ORAL | Status: AC
  Administered 2024-04-23: 40 meq via ORAL
  Filled 2024-04-23: qty 2

## 2024-04-23 MED ORDER — SODIUM CHLORIDE 0.9 % IV SOLN: 250.0000 mL | INTRAVENOUS | Status: AC | PRN

## 2024-04-23 MED ORDER — SERTRALINE HCL 50 MG PO TABS
50.0000 mg | ORAL_TABLET | Freq: Every day | ORAL | Status: DC
  Administered 2024-04-23 – 2024-04-24 (×2): 50 mg via ORAL
  Filled 2024-04-23 (×2): qty 1

## 2024-04-23 MED ORDER — SODIUM CHLORIDE 0.9% FLUSH: 3.0000 mL | INTRAVENOUS | Status: DC | PRN

## 2024-04-23 NOTE — Telephone Encounter (Signed)
 Patient Product/process Development Scientist completed.    The patient is insured through [facility]. Patient has [employer], may use a copay card, and/or apply for patient assistance if available.    Ran test claim for dofetilide  (Tikosyn ) 500 mcg and the current 30 day co-pay is $5.00.   This test claim was processed through [facility]- copay amounts may vary at other pharmacies due to pharmacy/plan contracts, or as the patient moves through the different stages of their insurance plan.     Reyes Sharps, CPHT Pharmacy Technician Patient Advocate Specialist Lead [facility] Patient Advocate Team Direct Number: [phone]  Fax: [phone]

## 2024-04-23 NOTE — Progress Notes (Signed)
 Morning EKG reviewed     Shows pt now in AFL with somewhat improved QTc at ~450-460 ms.  Continue  Tikosyn  500 mcg BID.   Potassium3.8 (02/04 0522) Magnesium  2.1 (02/04 0522) Creatinine, ser  0.81 (02/04 0522)  Pt will be NPO after midnight for DCCV if remains in afib/flutter   Ozell Prentice Passey, PA-C  04/23/2024 11:12 AM

## 2024-04-23 NOTE — Progress Notes (Signed)
 "  Electrophysiology Rounding Note  Patient Name: Belinda Garrett Date of Encounter: 04/23/2024  Primary Cardiologist: None  Electrophysiologist: Fonda Kitty, MD    Subjective   Pt remains in afib on Tikosyn  500 mcg BID   QTc from EKG last pm shows borderline QTc at ~490  The patient is doing well today.  At this time, the patient denies chest pain, shortness of breath, or any new concerns.  Inpatient Medications    Scheduled Meds:  apixaban   5 mg Oral BID   dofetilide   500 mcg Oral BID   gabapentin   400 mg Oral BID   lisinopril   30 mg Oral Daily   loratadine   10 mg Oral Daily   medroxyPROGESTERone   2.5 mg Oral Daily   metFORMIN   1,000 mg Oral Q breakfast   metoprolol  succinate  25 mg Oral Daily   potassium chloride   40 mEq Oral Once   sertraline   50 mg Oral Daily   sodium chloride  flush  3 mL Intravenous Q12H   Continuous Infusions:  sodium chloride      PRN Meds: sodium chloride , acetaminophen , sodium chloride  flush   Vital Signs    Vitals:   04/22/24 2051 04/22/24 2309 04/23/24 0031 04/23/24 0445  BP: 127/79 106/60  111/76  Pulse:  90 81 79  Resp: 19 18 18 16   Temp: 98.4 F (36.9 C) 98.3 F (36.8 C)  98.5 F (36.9 C)  TempSrc: Oral Oral  Oral  SpO2:   96%   Weight:      Height:        Intake/Output Summary (Last 24 hours) at 04/23/2024 0714 Last data filed at 04/22/2024 2045 Gross per 24 hour  Intake 120 ml  Output --  Net 120 ml   Filed Weights   04/22/24 1500 04/22/24 1525  Weight: (!) 211.4 kg (!) 211.5 kg    Physical Exam    GEN- NAD, A&O x 3. Normal affect.  Lungs- CTAB, Normal effort.  Heart- Irregularly irregular rate and rhythm. No M/G/R GI- Soft, NT, ND Extremities- No clubbing, cyanosis, or edema Skin- no rash or lesion  Labs    CBC No results for input(s): WBC, NEUTROABS, HGB, HCT, MCV, PLT in the last 72 hours. Basic Metabolic Panel Recent Labs    97/96/73 1019 04/23/24 0522  NA 143 140  K 4.5 3.8  CL 105  103  CO2 27 24  GLUCOSE 95 115*  BUN 12 12  CREATININE 0.85 0.81  CALCIUM 9.5 9.3  MG 2.0 2.1    Telemetry    AF 70-90s (personally reviewed)  Patient Profile     Belinda Garrett is a 54 y.o. female with a past medical history significant for persistent atrial fibrillation.  They were admitted for tikosyn  load.   Assessment & Plan    Persistent atrial fibrillation Pt remains in afib on Tikosyn  500 mcg BID  Continue Eliquis  Creatinine, ser  0.81 (02/04 0522) Magnesium  2.1 (02/04 0522) Potassium3.8 (02/04 0522) Supplement K With QT prolonging discussed possibility of needed to try a different medication like flecainide. She would prefer inpatient cardioversion if possible but would need to discuss timing of switch with pharmacy if ends up being necessary.   If pt does not convert chemically, plan on DCCV tomorrow   Obesity Body mass index is 88.09 kg/m.  Stressed importance of weight loss to long term maintenance of NSR.   For questions or updates, please contact [facility] Please consult [email or web address] for contact info under  Cardiology/STEMI.  Signed, Ozell Prentice Passey, PA-C  04/23/2024, 7:14 AM   "

## 2024-04-23 NOTE — Progress Notes (Signed)
Placed patient on CPAP for the night via auto-mode.  

## 2024-04-23 NOTE — Progress Notes (Addendum)
 Pharmacy: Dofetilide  (Tikosyn ) - Initial Consult Assessment and Electrolyte Replacement  Pharmacy consulted to assist in monitoring and replacing electrolytes in this 54 y.o. female admitted on 04/22/2024 undergoing dofetilide  initiation. First dofetilide  dose: 500 mcg 2/3 PM  Assessment:  Patient Exclusion Criteria: If any screening criteria checked as Yes, then  patient  should NOT receive dofetilide  until criteria item is corrected.  If Yes please indicate correction plan.  YES  NO Patient  Exclusion Criteria Correction Plan/Comments   []   [x]   Baseline QTc interval is greater than or equal to 440 msec. IF above YES box checked dofetilide  contraindicated unless patient has ICD; then may proceed if QTc 500-550 msec or with known ventricular conduction abnormalities may proceed with QTc 550-600 msec. QTc = 384 ms in SR    []   [x]   Patient is known or suspected to have a digoxin level greater than 2 ng/ml: No results found for: DIGOXIN     []   [x]   Creatinine clearance less than 20 ml/min (calculated using Cockcroft-Gault, actual body weight and serum creatinine): Calculated CrCl >100 mL/min using ABW (262 ml/min)     []   [x]  Patient has received drugs known to prolong the QT intervals within the last 48 hour (examples: phenothiazines, tricyclics or tetracyclic antidepressants, macrolides, 1st generation H-1 antihistamines (especially diphenhydramine), fluoroquinolones, azoles, ondansetron , metoclopramide, promethazine ).   Updated information on QT prolonging agents is available to be searched on the following database:QT prolonging agents -If SSRI or antihistamine needed, preferred options are sertraline  and loratadine  respectively  PTA Zoloft  50 mg, Claritin  10 mg daily (will monitor)   []   [x]  Patient received a dose of a thiazide diuretic in the last 48 hours [including hydrochlorothiazide  (Oretic ) alone or in any combination including triamterene (Dyazide, Maxzide)].     []   [x]  Patient received a medication known to increase dofetilide  plasma concentrations prior to initial dofetilide  dose:  Trimethoprim (Primsol, Proloprim) in the last 36 hours Verapamil (Calan, Verelan) in the last 36 hours or a sustained release dose in the last 72 hours Megestrol (Megace) in the last 5 days  Cimetidine (Tagamet) in the last 6 hours Ketoconazole  (Nizoral ) in the last 24 hours Itraconazole (Sporanox) in the last 48 hours  Prochlorperazine (Compazine) in the last 36 hours     []   [x]   Patient is known to have a history of torsades de pointes; congenital or acquired long QT syndromes.    []   [x]   Patient has received a Class 1 and Class 3 antiarrhythmic with less than 2 half-lives since last dose. (Disopyramide, Quinidine, Procainamide, Lidocaine , Mexiletine, Flecainide, Propafenone, Sotalol, Dronedarone)    []   [x]   Patient has received amiodarone therapy in the past 3 months or amiodarone level is greater than 0.3 ng/ml.    Labs:    Component Value Date/Time   K 3.8 04/23/2024 0522   K 3.7 02/21/2012 0947   MG 2.1 04/23/2024 0522     Plan: Select One Calculated CrCl  Dose q12h  [x]  > 60 ml/min 500 mcg  []  40-60 ml/min 250 mcg  []  20-40 ml/min 125 mcg   [x]   Physician selected initial dose within range recommended for patients level of renal function - will monitor for response.  [x]   Physician selected initial dose outside of range recommended for patients level of renal function - will discuss if the dose should be altered at this time.   Patient has been appropriately anticoagulated with PTA Eliquis  5 mg BID - no missed doses  per patient.  Potassium: K 3.8-3.9 - 40 mEq PO x 1 ordered  Magnesium: Mg >2: Appropriate to initiate Tikosyn , no replacement needed     Thank you for allowing pharmacy to participate in this patient's care   Maurilio Fila, PharmD Clinical Pharmacist 04/23/2024  7:22 AM

## 2024-04-23 NOTE — TOC Initial Note (Addendum)
 Transition of Care [facility]) - Initial/Assessment Note    Patient Details  Name: Belinda Garrett MRN: [id number] Date of Birth: 1970/11/14  [facility]) CM/SW Contact:    Andrez JULIANNA George, RN Phone Number: 04/23/2024, 10:54 AM  Clinical Narrative:                 54 y.o. female with morbid obesity, persistent symptomatic AF, HTN, OSA, and SVT who presents for Tikosyn  initiation.   Pt is from home alone. She states she has friends that can check on her.  Pt drives self and manages her own medications.  Her preferred pharmacy is [facility] pharmacy.   Her co pays for Eliquis  are $5.00 and she states she can afford this.  She has a friend that will transport her home at dc.  ICM following for further dc needs.   Expected Discharge Plan: Home/Self Care Barriers to Discharge: Continued Medical Work up   Patient Goals and CMS Choice            Expected Discharge Plan and Services   Discharge Planning Services: CM Consult   Living arrangements for the past 2 months: Apartment                                      Prior Living Arrangements/Services Living arrangements for the past 2 months: Apartment Lives with:: Self Patient language and need for interpreter reviewed:: Yes Do you feel safe going back to the place where you live?: Yes        Care giver support system in place?: No (comment) Current home services: DME (Cane and walker) Criminal Activity/Legal Involvement Pertinent to Current Situation/Hospitalization: No - Comment as needed  Activities of Daily Living   ADL Screening (condition at time of admission) Independently performs ADLs?: Yes (appropriate for developmental age) Is the patient deaf or have difficulty hearing?: No Does the patient have difficulty seeing, even when wearing glasses/contacts?: No Does the patient have difficulty concentrating, remembering, or making decisions?: No  Permission Sought/Granted                   Emotional Assessment Appearance:: Appears stated age Attitude/Demeanor/Rapport: Engaged Affect (typically observed): Accepting Orientation: : Oriented to Self, Oriented to Place, Oriented to  Time, Oriented to Situation   Psych Involvement: No (comment)  Admission diagnosis:  PAF (paroxysmal atrial fibrillation) (HCC) [I48.0] Patient Active Problem List   Diagnosis Date Noted   PAF (paroxysmal atrial fibrillation) (HCC) 04/22/2024   Insomnia 01/03/2024   Anemia 12/31/2023   Postmenopausal bleeding 12/31/2023   Abdominal obesity and metabolic syndrome 08/12/2022   Breast cancer screening by mammogram 08/12/2022   Nasal congestion 08/12/2022   Low vitamin D  level 08/12/2022   Acute non-recurrent sinusitis 05/16/2022   Paroxysmal atrial fibrillation (HCC) 12/29/2020   Meningioma (HCC) 08/18/2020   Lesion of parotid gland 08/09/2020   Seborrheic dermatitis 06/16/2020   Palpitations 06/16/2020   Other chronic pain 06/16/2020   Annual physical exam 12/16/2019   OSA on CPAP 12/02/2019   Morbid obesity with BMI of 70 and over, adult (HCC) 10/06/2019   Chronic pain of both knees 07/02/2019   Bilateral leg edema 06/11/2019   DUB (dysfunctional uterine bleeding) 06/11/2019   Peripheral neuropathy 09/30/2018   Mood disorder 07/11/2018   Abnormal CT scan, lumbar spine 07/11/2018   Prediabetes 01/09/2018   Leukocytosis 01/08/2018   Rosacea 01/08/2018  Chronic venous insufficiency of lower extremity 01/08/2018   Hyperlipidemia LDL goal <100 10/05/2017   HTN (hypertension) 09/05/2017   Numbness of toes 09/05/2017   Vertigo 03/19/2017   Endometrial hyperplasia, simple 11/20/2013   PCP:  Dineen Rollene MATSU, FNP Pharmacy:   [facility] - [facility] [address] [place] [address] Phone: [phone] Fax: [phone]     Social Drivers of Health (SDOH) Social History: SDOH Screenings   Food Insecurity: No Food Insecurity (04/22/2024)   Housing: Low Risk (04/22/2024)  Transportation Needs: No Transportation Needs (04/22/2024)  Utilities: Not At Risk (04/22/2024)  Depression (PHQ2-9): Low Risk (03/28/2024)  Financial Resource Strain: Low Risk  (03/11/2024)   Received from [facility]  Physical Activity: Inactive (12/31/2023)  Social Connections: Moderately Integrated (04/22/2024)  Stress: Stress Concern Present (12/31/2023)  Tobacco Use: Low Risk (04/22/2024)   SDOH Interventions:     Readmission Risk Interventions     No data to display

## 2024-04-24 LAB — BASIC METABOLIC PANEL WITH GFR
Anion gap: 12 (ref 5–15)
BUN: 15 mg/dL (ref 6–20)
CO2: 24 mmol/L (ref 22–32)
Calcium: 9.2 mg/dL (ref 8.9–10.3)
Chloride: 103 mmol/L (ref 98–111)
Creatinine, Ser: 0.79 mg/dL (ref 0.44–1.00)
GFR, Estimated: >60 mL/min
Glucose, Bld: 116 mg/dL — ABNORMAL HIGH (ref 70–99)
Potassium: 4.2 mmol/L (ref 3.5–5.1)
Sodium: 138 mmol/L (ref 135–145)

## 2024-04-24 LAB — MAGNESIUM: Magnesium: 2.1 mg/dL (ref 1.7–2.4)

## 2024-04-24 LAB — HEMOGLOBIN A1C
Hgb A1c MFr Bld: 5.9 % — ABNORMAL HIGH (ref 4.8–5.6)
Mean Plasma Glucose: 122.63 mg/dL

## 2024-04-24 NOTE — Progress Notes (Signed)
 Morning EKG reviewed     Shows pt remains in afib/flutter with borderline QTc at ~480-490 ms.  Continue  Tikosyn  500 mcg BID.   Potassium4.2 (02/05 0427) Magnesium  2.1 (02/05 0427) Creatinine, ser  0.79 (02/05 0427)  Pt will be NPO after midnight for DCCV if remains in afib  Initially had planned cardioversion today but still waiting to here back from Insurance re: Pre-cert for admission.   Pt aware of change to schedule.    Belinda Prentice Passey, PA-C  04/24/2024 10:48 AM

## 2024-04-24 NOTE — Progress Notes (Cosign Needed)
" ° °  Electrophysiology Rounding Note  Patient Name: Belinda Garrett Date of Encounter: 04/24/2024  Primary Cardiologist: None  Electrophysiologist: Fonda Kitty, MD    Subjective   Pt remains in atrial flutter on Tikosyn  500 mcg BID   QTc from EKG last pm shows stable QTc at  The patient is doing well today.  At this time, the patient denies chest pain, shortness of breath, or any new concerns.  Inpatient Medications    Scheduled Meds:  apixaban   5 mg Oral BID   dofetilide   500 mcg Oral BID   gabapentin   400 mg Oral BID   lisinopril   30 mg Oral Daily   loratadine   10 mg Oral Daily   medroxyPROGESTERone   2.5 mg Oral Daily   metFORMIN   1,000 mg Oral Q breakfast   metoprolol  succinate  25 mg Oral Daily   sertraline   50 mg Oral Daily   sodium chloride  flush  3 mL Intravenous Q12H   sodium chloride  flush  3 mL Intravenous Q12H   Continuous Infusions:  sodium chloride      PRN Meds: sodium chloride , acetaminophen , sodium chloride  flush, sodium chloride  flush   Vital Signs    Vitals:   04/23/24 1710 04/23/24 2013 04/23/24 2300 04/24/24 0405  BP: 107/71 115/72 110/70 129/79  Pulse: 99  95 85  Resp: 16 18 20 19   Temp: 98.6 F (37 C) 98.1 F (36.7 C) 98 F (36.7 C) 98.2 F (36.8 C)  TempSrc: Oral Oral Oral Oral  SpO2:  95%    Weight:      Height:        Intake/Output Summary (Last 24 hours) at 04/24/2024 0749 Last data filed at 04/23/2024 1325 Gross per 24 hour  Intake 140 ml  Output --  Net 140 ml   Filed Weights   04/22/24 1500 04/22/24 1525  Weight: (!) 211.4 kg (!) 211.5 kg    Physical Exam    GEN- NAD, A&O x 3. Normal affect.  Lungs- CTAB, Normal effort.  Heart- Irregularly irregular rate and rhythm. No M/G/R GI- Soft, NT, ND Extremities- No clubbing, cyanosis, or edema Skin- no rash or lesion  Labs    CBC No results for input(s): WBC, NEUTROABS, HGB, HCT, MCV, PLT in the last 72 hours. Basic Metabolic Panel Recent Labs     04/23/24 0522 04/24/24 0427  NA 140 138  K 3.8 4.2  CL 103 103  CO2 24 24  GLUCOSE 115* 116*  BUN 12 15  CREATININE 0.81 0.79  CALCIUM 9.3 9.2  MG 2.1 2.1    Telemetry    Atrial flutter with variable conduction, rates 70s-low 100s (personally reviewed)  Patient Profile     Belinda Garrett is a 54 y.o. female with a past medical history significant for persistent atrial fibrillation.  They were admitted for tikosyn  load.   Assessment & Plan    Persistent atrial fibrillation Pt remains in atrial flutter on Tikosyn  500 mcg BID  Continue Eliquis  Creatinine, ser  0.79 (02/05 0427) Magnesium  2.1 (02/05 0427) Potassium4.2 (02/05 0427) No electrolyte supplementation needed  If pt does not convert chemically, plan on DCCV today      For questions or updates, please contact [facility] Please consult [email or web address] for contact info under Cardiology/STEMI.  Bonney Artist Pouch, PA-C  04/24/2024, 7:49 AM   "

## 2024-04-24 NOTE — Progress Notes (Signed)
 Pharmacy: Dofetilide  (Tikosyn ) - Follow Up Assessment and Electrolyte Replacement  Pharmacy consulted to assist in monitoring and replacing electrolytes in this 54 y.o. female admitted on 04/22/2024 undergoing dofetilide  initiation. First dofetilide  dose: 500 mcg 2/3 PM  Labs:    Component Value Date/Time   K 4.2 04/24/2024 0427   K 3.7 02/21/2012 0947   MG 2.1 04/24/2024 0427     Plan: Potassium: K >/= 4: No additional supplementation needed  Magnesium: Mg > 2: No additional supplementation needed   Thank you for allowing pharmacy to participate in this patient's care   Maurilio Fila, PharmD Clinical Pharmacist 04/24/2024  7:03 AM

## 2024-04-24 NOTE — Plan of Care (Signed)

## 2024-04-24 NOTE — Progress Notes (Signed)
" °   04/24/24 2332  BiPAP/CPAP/SIPAP  BiPAP/CPAP/SIPAP Pt Type Adult  Reason BIPAP/CPAP not in use Non-compliant    "

## 2024-04-24 NOTE — Anesthesia Preprocedure Evaluation (Signed)
 "                                  Anesthesia Evaluation  Patient identified by MRN, date of birth, ID band Patient awake    Reviewed: Allergy & Precautions, NPO status , Patient's Chart, lab work & pertinent test results  History of Anesthesia Complications Negative for: history of anesthetic complications  Airway Mallampati: III  TM Distance: >3 FB Neck ROM: Full   Comment: Previous grade I view with Glidescope 3, easy mask Dental  (+) Dental Advisory Given   Pulmonary neg shortness of breath, sleep apnea and Continuous Positive Airway Pressure Ventilation , neg COPD, neg recent URI   Pulmonary exam normal breath sounds clear to auscultation       Cardiovascular hypertension (lisinopril ,metoprolol ), Pt. on medications and Pt. on home beta blockers (-) angina (-) Past MI, (-) Cardiac Stents and (-) CABG + dysrhythmias Atrial Fibrillation  Rhythm:Irregular Rate:Normal  HLD  TTE 02/25/2024: IMPRESSIONS    1. Left ventricular ejection fraction, by estimation, is 55 to 60%. Left  ventricular ejection fraction by PLAX is 70 %. The left ventricle has  normal function. The left ventricle has no regional wall motion  abnormalities. Left ventricular diastolic  parameters are indeterminate.   2. Right ventricular systolic function is normal. The right ventricular  size is normal.   3. The mitral valve is normal in structure. No evidence of mitral valve  regurgitation. No evidence of mitral stenosis.   4. The aortic valve is normal in structure. Aortic valve regurgitation is  not visualized. No aortic stenosis is present.   5. The inferior vena cava is normal in size with greater than 50%  respiratory variability, suggesting right atrial pressure of 3 mmHg.     Neuro/Psych neg Seizures PSYCHIATRIC DISORDERS Anxiety Depression    Brain mass s/p craniotomy, vertigo  Neuromuscular disease (peripheral neuropathy)    GI/Hepatic negative GI ROS, Neg liver ROS,,,   Endo/Other    Class 4 obesityPre-diabetes  Renal/GU negative Renal ROS     Musculoskeletal  (+) Arthritis ,    Abdominal  (+) + obese  Peds  Hematology  (+) Blood dyscrasia, anemia Lab Results      Component                Value               Date                      WBC                      10.7                03/19/2024                HGB                      12.3                03/19/2024                HCT                      39.7                03/19/2024  MCV                      82                  03/19/2024                PLT                      265                 03/19/2024              Anesthesia Other Findings Last Eliquis : this morning  Reproductive/Obstetrics                              Anesthesia Physical Anesthesia Plan  ASA: 4  Anesthesia Plan: General   Post-op Pain Management: Minimal or no pain anticipated   Induction: Intravenous  PONV Risk Score and Plan: 3 and Treatment may vary due to age or medical condition  Airway Management Planned: Natural Airway and Nasal Cannula  Additional Equipment:   Intra-op Plan:   Post-operative Plan:   Informed Consent: I have reviewed the patients History and Physical, chart, labs and discussed the procedure including the risks, benefits and alternatives for the proposed anesthesia with the patient or authorized representative who has indicated his/her understanding and acceptance.       Plan Discussed with: CRNA and Anesthesiologist  Anesthesia Plan Comments: (Risks of general anesthesia discussed including, but not limited to, sore throat, hoarse voice, chipped/damaged teeth, injury to vocal cords, nausea and vomiting, allergic reactions, lung infection, heart attack, stroke, and death. All questions answered. )         Anesthesia Quick Evaluation  "

## 2024-04-25 ENCOUNTER — Encounter (HOSPITAL_COMMUNITY): Payer: Self-pay | Admitting: Internal Medicine

## 2024-04-25 ENCOUNTER — Other Ambulatory Visit: Payer: Self-pay

## 2024-04-25 ENCOUNTER — Other Ambulatory Visit (HOSPITAL_COMMUNITY): Payer: Self-pay

## 2024-04-25 ENCOUNTER — Ambulatory Visit (HOSPITAL_COMMUNITY): Admitting: Nurse Practitioner

## 2024-04-25 ENCOUNTER — Encounter (HOSPITAL_COMMUNITY): Payer: Self-pay

## 2024-04-25 ENCOUNTER — Encounter (HOSPITAL_COMMUNITY): Admission: RE | Disposition: A | Payer: Self-pay | Source: Ambulatory Visit | Attending: Cardiology

## 2024-04-25 LAB — BASIC METABOLIC PANEL WITH GFR
Anion gap: 12 (ref 5–15)
BUN: 16 mg/dL (ref 6–20)
CO2: 24 mmol/L (ref 22–32)
Calcium: 8.9 mg/dL (ref 8.9–10.3)
Chloride: 104 mmol/L (ref 98–111)
Creatinine, Ser: 0.89 mg/dL (ref 0.44–1.00)
GFR, Estimated: >60 mL/min
Glucose, Bld: 117 mg/dL — ABNORMAL HIGH (ref 70–99)
Potassium: 3.9 mmol/L (ref 3.5–5.1)
Sodium: 140 mmol/L (ref 135–145)

## 2024-04-25 LAB — MAGNESIUM: Magnesium: 2.2 mg/dL (ref 1.7–2.4)

## 2024-04-25 MED ORDER — POTASSIUM CHLORIDE CRYS ER 20 MEQ PO TBCR
40.0000 meq | EXTENDED_RELEASE_TABLET | Freq: Once | ORAL | Status: AC
  Administered 2024-04-25: 40 meq via ORAL
  Filled 2024-04-25: qty 2

## 2024-04-25 MED ORDER — LIDOCAINE HCL (PF) 2 % IJ SOLN
INTRAMUSCULAR | Status: DC | PRN
  Administered 2024-04-25: 80 mg via INTRADERMAL

## 2024-04-25 MED ORDER — DOFETILIDE 500 MCG PO CAPS
500.0000 ug | ORAL_CAPSULE | Freq: Two times a day (BID) | ORAL | 6 refills | Status: AC
  Filled 2024-04-25: qty 60, 30d supply, fill #0

## 2024-04-25 MED ORDER — PROPOFOL 10 MG/ML IV BOLUS
INTRAVENOUS | Status: DC | PRN
  Administered 2024-04-25: 100 mg via INTRAVENOUS

## 2024-04-25 MED ORDER — SODIUM CHLORIDE 0.9 % IV SOLN: INTRAVENOUS | Status: DC

## 2024-04-25 NOTE — Anesthesia Postprocedure Evaluation (Signed)
"   Anesthesia Post Note  Patient: Belinda Garrett  Procedure(s) Performed: CARDIOVERSION     Patient location during evaluation: PACU Anesthesia Type: General Level of consciousness: awake Pain management: pain level controlled Vital Signs Assessment: post-procedure vital signs reviewed and stable Respiratory status: spontaneous breathing, nonlabored ventilation and respiratory function stable Cardiovascular status: blood pressure returned to baseline and stable Postop Assessment: no apparent nausea or vomiting Anesthetic complications: no   No notable events documented.  Last Vitals:  Vitals:   04/25/24 0915 04/25/24 0920  BP: 113/65 (!) 100/53  Pulse: 80 75  Resp: 15 15  Temp:    SpO2: 95% 95%    Last Pain:  Vitals:   04/25/24 0915  TempSrc:   PainSc: 0-No pain                 Delon Aisha Arch      "

## 2024-04-25 NOTE — CV Procedure (Signed)
" ° ° °  CARDIOVERSION NOTE  Procedure: Electrical Cardioversion Indications:  Atrial Fibrillation  Procedure Details:  Consent: Risks of procedure as well as the alternatives and risks of each were explained to the (patient/caregiver).  Consent for procedure obtained.  Time Out: Verified patient identification, verified procedure, site/side was marked, verified correct patient position, special equipment/implants available, medications/allergies/relevent history reviewed, required imaging and test results available.  Performed  Patient placed on cardiac monitor, pulse oximetry, supplemental oxygen as necessary.  Sedation given: propofol  per anesthesia Pacer pads placed anterior and posterior chest.  Cardioverted 2 time(s).  Cardioverted at 360J biphasic x 2 with pressure  Impression: Findings: Post procedure EKG shows: NSR Complications: None Patient did tolerate procedure well.  Plan: Successful DCCV after 2 stacked shocks with pressure at 360J biphasic  Time Spent Directly with the Patient:  30 minutes   Vinie KYM Maxcy, MD, [facility], FNLA, FACP  [place]  [facility]  Medical Director of the Advanced Lipid Disorders &  Cardiovascular Risk Reduction Clinic Diplomate of the American Board of Clinical Lipidology Attending Cardiologist  Direct Dial: [phone]  Fax: [phone]  Website:  [email or web address]  Vinie BROCKS Sokhna Christoph 04/25/2024, 9:12 AM     "

## 2024-04-25 NOTE — Progress Notes (Signed)
 Discharge instructions (including medications) discussed with and copy provided to patient and she verbalized understanding.  PIV x1 removed and patient dressing herself. Needs meds at [facility] and ride is on the way.

## 2024-04-25 NOTE — Telephone Encounter (Signed)
 Spoke to pt scheduled her with Narendra for 04/28/24 due to not having anything available until 05/08/24

## 2024-04-25 NOTE — Interval H&P Note (Signed)
 History and Physical Interval Note:  04/25/2024 8:18 AM  Belinda Garrett  has presented today for surgery, with the diagnosis of afib.  The various methods of treatment have been discussed with the patient and family. After consideration of risks, benefits and other options for treatment, the patient has consented to  Procedures: CARDIOVERSION (N/A) as a surgical intervention.  The patient's history has been reviewed, patient examined, no change in status, stable for surgery.  I have reviewed the patient's chart and labs.  Questions were answered to the patient's satisfaction.     Belinda Garrett

## 2024-04-25 NOTE — Transfer of Care (Signed)
 Immediate Anesthesia Transfer of Care Note  Patient: Belinda Garrett  Procedure(s) Performed: CARDIOVERSION  Patient Location: Cath Lab  Anesthesia Type:General  Level of Consciousness: drowsy and patient cooperative  Airway & Oxygen Therapy: Patient Spontanous Breathing and Patient connected to nasal cannula oxygen  Post-op Assessment: Report given to RN, Post -op Vital signs reviewed and stable, Patient moving all extremities, and Patient moving all extremities X 4  Post vital signs: Reviewed and stable  Last Vitals:  Vitals Value Taken Time  BP 96/56 04/25/24 09:14  Temp    Pulse 79 04/25/24 09:14  Resp 12 04/25/24 09:14  SpO2 97 04/25/24 09:14    Last Pain:  Vitals:   04/25/24 0845  TempSrc:   PainSc: 0-No pain         Complications: No notable events documented.

## 2024-04-25 NOTE — Progress Notes (Cosign Needed)
" ° °  Electrophysiology Rounding Note  Patient Name: Belinda Garrett Date of Encounter: 04/25/2024  Primary Cardiologist: None  Electrophysiologist: Fonda Kitty, MD    Subjective   Pt remains in afib on Tikosyn  500 mcg BID   QTc from EKG last pm shows borderline QTc at ~490 ms  The patient is doing well today.  At this time, the patient denies chest pain, shortness of breath, or any new concerns.  Inpatient Medications    Scheduled Meds:  apixaban   5 mg Oral BID   dofetilide   500 mcg Oral BID   gabapentin   400 mg Oral BID   lisinopril   30 mg Oral Daily   loratadine   10 mg Oral Daily   medroxyPROGESTERone   2.5 mg Oral Daily   metFORMIN   1,000 mg Oral Q breakfast   metoprolol  succinate  25 mg Oral Daily   sertraline   50 mg Oral Daily   sodium chloride  flush  3 mL Intravenous Q12H   sodium chloride  flush  3 mL Intravenous Q12H   Continuous Infusions:  sodium chloride  20 mL/hr at 04/25/24 0318   PRN Meds: acetaminophen , sodium chloride  flush, sodium chloride  flush   Vital Signs    Vitals:   04/24/24 1549 04/24/24 2000 04/24/24 2349 04/25/24 0325  BP: 102/68 114/69 106/76 105/69  Pulse: 79 70 85 84  Resp: 17 18 18 18   Temp: 98.9 F (37.2 C) 98.5 F (36.9 C) 98.4 F (36.9 C) 98.4 F (36.9 C)  TempSrc: Oral Oral Oral Oral  SpO2: 95% 96% 97% 98%  Weight:      Height:        Intake/Output Summary (Last 24 hours) at 04/25/2024 0708 Last data filed at 04/24/2024 1230 Gross per 24 hour  Intake 200 ml  Output --  Net 200 ml   Filed Weights   04/22/24 1500 04/22/24 1525  Weight: (!) 211.4 kg (!) 211.5 kg    Physical Exam    GEN- NAD, A&O x 3. Normal affect.  Lungs- CTAB, Normal effort.  Heart- Irregularly irregular rate and rhythm. No M/G/R GI- Soft, NT, ND Extremities- No clubbing, cyanosis, or edema Skin- no rash or lesion  Labs    CBC No results for input(s): WBC, NEUTROABS, HGB, HCT, MCV, PLT in the last 72 hours. Basic Metabolic  Panel Recent Labs    04/24/24 0427 04/25/24 0447  NA 138 140  K 4.2 3.9  CL 103 104  CO2 24 24  GLUCOSE 116* 117*  BUN 15 16  CREATININE 0.79 0.89  CALCIUM 9.2 8.9  MG 2.1 2.2    Telemetry    AF 80-100s (personally reviewed)  Patient Profile     Belinda Garrett is a 54 y.o. female with a past medical history significant for persistent atrial fibrillation.  They were admitted for tikosyn  load.   Assessment & Plan    Persistent atrial fibrillation Atrial flutter Pt remains in afib on Tikosyn  500 mcg BID  Continue Eliquis  Creatinine, ser  0.89 (02/06 0447) Magnesium  2.2 (02/06 0447) Potassium3.9 (02/06 0447) Supplement K  Obesity Body mass index is 88.09 kg/m.  Encouraged lifestyle modification  For cardioversion this am, re-assess Qt after. Tentatively home pending QT in sinus  For questions or updates, please contact [facility] Please consult [email or web address] for contact info under Cardiology/STEMI.  Signed, Ozell Prentice Passey, PA-C  04/25/2024, 7:08 AM   "

## 2024-04-25 NOTE — TOC CM/SW Note (Addendum)
 04-25-24 ICM received consult to do a benefits check for Zepbound - awaiting cost. ICM will continue to follow for additional needs as the patient progresses.   1104 Benefits check completed and medication is not covered via insurance.

## 2024-04-25 NOTE — Discharge Summary (Cosign Needed)
 "     ELECTROPHYSIOLOGY DISCHARGE SUMMARY    Patient ID: Belinda Garrett,  MRN: [id number], DOB/AGE: Dec 15, 1970 54 y.o.  Admit date: 04/22/2024 Discharge date: 04/25/2024  Primary Care Physician: Dineen Rollene MATSU, FNP  Primary Cardiologist: None  Electrophysiologist: Dr. Almetta   Primary Discharge Diagnosis:  Persistent atrial fibrillation status post Tikosyn  loading this admission  Secondary Discharge Diagnosis:  Morbid Obesity  Allergies[1]   Procedures This Admission:  1.  Tikosyn  loading  2.  Direct current cardioversion on 04/25/24 by Dr Mona which successfully restored SR.  There were no early apparent complications.   Brief HPI: Belinda Garrett is a 54 y.o. female with a past medical history as noted above.  They were referred to EP for treatment options of atrial fibrillation.  Risks, benefits, and alternatives to Tikosyn  were reviewed with the patient who wished to proceed with admission for loading.  Hospital Course:  The patient was admitted and Tikosyn  was initiated.  Renal function and electrolytes were followed during the hospitalization.  Their QTc remained stable. On 04/25/2024 they underwent direct current cardioversion which restored sinus rhythm. The patients QTc remained stable. They were monitored on telemetry up to discharge. On the day of discharge, they were examined by Dr. Almetta  who considered them stable for discharge to home.  Follow-up has been arranged with the Atrial Fibrillation clinic in approximately 1 week.   Physical Exam: Vitals:   04/25/24 0850 04/25/24 0855 04/25/24 0915 04/25/24 0920  BP:   113/65 (!) 100/53  Pulse: 84 90 80 75  Resp: 18 18 15 15   Temp:      TempSrc:      SpO2: 93% 95% 95% 95%  Weight:      Height:        GEN- NAD, A&O x 3. Normal affect.  Lungs- CTAB, Normal effort.  Heart- Regular rate and rhythm. No M/G/R GI- Soft, NT, ND Extremities- No clubbing, cyanosis, or edema Skin- no rash or lesion  Labs:    Lab Results  Component Value Date   WBC 10.7 03/19/2024   HGB 12.3 03/19/2024   HCT 39.7 03/19/2024   MCV 82 03/19/2024   PLT 265 03/19/2024    Recent Labs  Lab 04/25/24 0447  NA 140  K 3.9  CL 104  CO2 24  BUN 16  CREATININE 0.89  CALCIUM 8.9  GLUCOSE 117*    Discharge Medications:  Allergies as of 04/25/2024       Reactions   Dust Mite Extract Other (See Comments)   Sinus and eye irritation   Latex Other (See Comments)   Tingling sensation on contact   Pollen Extract Other (See Comments)   Sinus and eye irritation        Medication List     TAKE these medications    acetaminophen  500 MG tablet Commonly known as: TYLENOL  Take 1,000 mg by mouth in the morning and at bedtime.   clotrimazole-betamethasone cream Commonly known as: LOTRISONE Apply 1 Application topically 2 (two) times daily as needed (skin irritation).   dofetilide  500 MCG capsule Commonly known as: TIKOSYN  Take 1 capsule (500 mcg total) by mouth 2 (two) times daily.   doxycycline  100 MG capsule Commonly known as: MONODOX  Take 1 capsule (100 mg total) by mouth 2 (two) times daily with food and plenty of drink What changed:  how much to take when to take this   Eliquis  5 MG Tabs tablet Generic drug: apixaban  Take 1 tablet (5 mg  total) by mouth 2 (two) times daily.   FISH OIL PO Take 1 capsule by mouth daily.   fluticasone  50 MCG/ACT nasal spray Commonly known as: FLONASE  Place 2 sprays into both nostrils daily as needed for allergies or rhinitis. What changed: when to take this   gabapentin  400 MG capsule Commonly known as: NEURONTIN  Take 1 capsule (400 mg total) by mouth 2 (two) times daily.   lisinopril  30 MG tablet Commonly known as: ZESTRIL  Take 1 tablet (30 mg total) by mouth daily.   loratadine  10 MG tablet Commonly known as: CLARITIN  Take 10 mg by mouth daily.   medroxyPROGESTERone  2.5 MG tablet Commonly known as: PROVERA  Take 1 tablet (2.5 mg total) by mouth  daily.   metFORMIN  500 MG 24 hr tablet Commonly known as: GLUCOPHAGE -XR Take 2 tablets (1,000 mg total) by mouth every evening. What changed: when to take this   metoprolol  succinate 25 MG 24 hr tablet Commonly known as: Toprol  XL Take 1 tablet (25 mg total) by mouth daily. What changed: when to take this   metroNIDAZOLE  0.75 % gel Commonly known as: METROGEL  Apply thin layer on face once or twice daily What changed:  how much to take when to take this reasons to take this   sertraline  50 MG tablet Commonly known as: ZOLOFT  Take 1 tablet (50 mg total) by mouth daily. What changed: when to take this   triamcinolone  ointment 0.1 % Commonly known as: KENALOG  Apply 1 gram twice daily to affected areas of skin. Stop once resolved and restart as needed for flares. Avoid use on face, armpits, groin unless otherwise indicated. What changed:  how much to take how to take this when to take this reasons to take this additional instructions   Vitamin D3 50 MCG (2000 UT) capsule Take 2,000 Units by mouth daily.        Disposition:  Home with follow up in AF clinic in 1 week as in AVS.   Duration of Discharge Encounter:  APP time: 24 minutes  Signed, Ozell Prentice Passey, PA-C  04/25/2024 11:20 AM        [1]  Allergies Allergen Reactions   Dust Mite Extract Other (See Comments)    Sinus and eye irritation   Latex Other (See Comments)    Tingling sensation on contact   Pollen Extract Other (See Comments)    Sinus and eye irritation   "

## 2024-04-25 NOTE — Plan of Care (Signed)

## 2024-04-28 ENCOUNTER — Ambulatory Visit: Admitting: Internal Medicine

## 2024-05-02 ENCOUNTER — Ambulatory Visit (HOSPITAL_COMMUNITY): Admitting: Nurse Practitioner

## 2024-06-26 ENCOUNTER — Ambulatory Visit: Admitting: Cardiology

## 2025-01-22 ENCOUNTER — Ambulatory Visit
